# Patient Record
Sex: Female | Born: 1975 | Race: Black or African American | Hispanic: No | Smoking: Never smoker
Health system: Southern US, Community
[De-identification: ages and names within clinical notes are randomized; demographics above are authoritative.]

## PROBLEM LIST (undated history)

## (undated) ENCOUNTER — Emergency Department (HOSPITAL_COMMUNITY): Admission: EM | Disposition: A | Discharge status: Home / Self Care | Payer: Self-pay | Source: Home / Self Care

## (undated) DIAGNOSIS — F209 Schizophrenia, unspecified: Secondary | ICD-10-CM

## (undated) DIAGNOSIS — F32A Depression, unspecified: Secondary | ICD-10-CM

## (undated) HISTORY — PX: INDUCED ABORTION: SHX677

---

## 2007-11-29 ENCOUNTER — Emergency Department
Admission: EM | Admit: 2007-11-29 | Disposition: A | Discharge status: Home / Self Care | Payer: Self-pay | Source: Emergency Department | Admitting: Emergency Medicine

## 2008-02-08 ENCOUNTER — Emergency Department
Admission: EM | Admit: 2008-02-08 | Disposition: A | Discharge status: Home / Self Care | Payer: Self-pay | Source: Emergency Department | Admitting: Emergency Medicine

## 2008-02-08 ENCOUNTER — Ambulatory Visit
Admission: RE | Admit: 2008-02-08 | Disposition: A | Discharge status: Home / Self Care | Payer: Self-pay | Source: Ambulatory Visit | Admitting: Geriatric Psychiatry

## 2008-02-13 LAB — COMPREHENSIVE METABOLIC PANEL
ALT: 21 U/L (ref 7–56)
AST (SGOT): 28 U/L (ref 5–40)
Albumin, Synovial: 5.2 g/dL — ABNORMAL HIGH (ref 3.9–5.0)
Alkaline Phosphatase: 84 U/L (ref 38–126)
BUN / Creatinine Ratio: 13 (ref 8–20)
BUN: 9 mg/dL (ref 6–20)
Bilirubin, Total: 0.3 mg/dL (ref 0.2–1.3)
CO2: 26 mmol/L (ref 21.0–31.0)
Calcium: 10.2 mg/dL (ref 8.4–10.2)
Chloride: 101 mmol/L (ref 101–111)
Creatinine: 0.71 mg/dL (ref 0.5–1.4)
EGFR: >60 mL/min/{1.73_m2}
EGFR: >60 mL/min/{1.73_m2}
Glucose: 93 mg/dL (ref 70–100)
Potassium: 4 mmol/L (ref 3.6–5.0)
Protein, Total: 9.3 g/dL — ABNORMAL HIGH (ref 6.3–8.2)
Sodium: 140 mmol/L (ref 135–145)

## 2008-02-13 LAB — ^CBC WITH DIFF MCKESSON
BASOPHILS %: 0.3 % (ref 0–2)
Baso(Absolute): 0
Eosinophils %: 0 % (ref 0–6)
Eosinophils Absolute: 0
Hematocrit: 38.1 % (ref 27.0–49.5)
Hemoglobin: 12.6 g/dL (ref 11.7–15.5)
Lymphocytes Absolute: 0.9
Lymphocytes Relative: 13.4 % — ABNORMAL LOW (ref 25–55)
MCH: 27.2 pg (ref 27.0–34.0)
MCHC: 33.2 % (ref 32.0–36.0)
MCV: 81.9 fL (ref 80–100)
Monocytes Absolute: 0.5
Monocytes Relative %: 7.4 % (ref 1–8)
Neutrophils Absolute: 5
Neutrophils Relative %: 78.9 % — ABNORMAL HIGH (ref 49–69)
Platelets: 346 10*3/uL (ref 150–400)
RBC: 4.65 /mm3 (ref 3.80–5.40)
RDW: 13.7 % (ref 11.0–14.0)
WBC: 6.4 10*3/uL (ref 4.8–10.8)

## 2008-02-13 LAB — URINALYSIS
Bilirubin, UA: NEGATIVE
Blood, UA: NEGATIVE
Glucose, UA: NEGATIVE
Leukocyte Esterase, UA: NEGATIVE
Nitrate: NEGATIVE
Protein, UR: NEGATIVE
Specific Gravity, UR: 1.001 (ref 1.000–1.035)
Urobilinogen, UA: NORMAL
pH, Urine: 6 (ref 5.0–8.0)

## 2008-02-13 LAB — STS, SERUM: RPR: NONREACTIVE

## 2008-02-13 LAB — TOXICOLOGY SCREEN, SERUM
Barbiturate Screen, UR: NEGATIVE
Benzodiazepine Screen, UR: NEGATIVE
Cocaine Screen: NEGATIVE
Opiate Screen: NEGATIVE
PCP Screen: NEGATIVE
THC Screen: NEGATIVE
Urine Amphetamine Screen: NEGATIVE

## 2008-02-13 LAB — VITAMIN B12 AND FOLATE
Folate: 16.6 ng/mL (ref 2.7–21.0)
Vitamin B-12: 772 pg/mL (ref 239–931)

## 2008-02-13 LAB — ALCOHOL, SERUM: Alcohol Screen Serum: <10 mg/dL (ref 0–50)

## 2008-02-13 LAB — TSH: TSH, 3rd Generation: 1.1 mIU/L (ref 0.465–4.680)

## 2008-02-13 LAB — ACETAMINOPHEN LEVEL: Acetaminophen Level: <10 ug/mL — ABNORMAL LOW (ref 10.0–30.0)

## 2008-02-13 LAB — HCG, SERUM, QUALITATIVE: Hcg Qualitative: NEGATIVE

## 2008-02-13 LAB — SALICYLATE LEVEL: Salicylate Level: <1 mg/dL (ref 0.0–20.0)

## 2008-02-14 ENCOUNTER — Ambulatory Visit
Admission: EM | Admit: 2008-02-14 | Disposition: A | Discharge status: Home / Self Care | Payer: Self-pay | Source: Emergency Department

## 2008-02-23 LAB — COMPREHENSIVE METABOLIC PANEL
ALT: 35 U/L (ref 7–56)
AST (SGOT): 56 U/L — ABNORMAL HIGH (ref 5–40)
Albumin, Synovial: 4.7 g/dL (ref 3.9–5.0)
Alkaline Phosphatase: 68 U/L (ref 38–126)
BUN / Creatinine Ratio: 14 (ref 8–20)
BUN: 9 mg/dL (ref 6–20)
Bilirubin, Total: 0.2 mg/dL (ref 0.2–1.3)
CO2: 29 mmol/L (ref 21.0–31.0)
Calcium: 9.9 mg/dL (ref 8.4–10.2)
Chloride: 103 mmol/L (ref 101–111)
Creatinine: 0.63 mg/dL (ref 0.5–1.4)
EGFR: >60 mL/min/{1.73_m2}
EGFR: >60 mL/min/{1.73_m2}
Glucose: 87 mg/dL (ref 70–100)
Potassium: 3.9 mmol/L (ref 3.6–5.0)
Protein, Total: 8.2 g/dL (ref 6.3–8.2)
Sodium: 141 mmol/L (ref 135–145)

## 2008-02-23 LAB — ^CBC WITH DIFF MCKESSON
BASOPHILS %: 0.8 % (ref 0–2)
Baso(Absolute): 0
Eosinophils %: 0.1 % (ref 0–6)
Eosinophils Absolute: 0
Hematocrit: 38.3 % (ref 27.0–49.5)
Hemoglobin: 12.7 g/dL (ref 11.7–15.5)
Lymphocytes Absolute: 1.2
Lymphocytes Relative: 19 % — ABNORMAL LOW (ref 25–55)
MCH: 27.2 pg (ref 27.0–34.0)
MCHC: 33 % (ref 32.0–36.0)
MCV: 82.4 fL (ref 80–100)
Monocytes Absolute: 0.3
Monocytes Relative %: 4.7 % (ref 1–8)
Neutrophils Absolute: 4.6
Neutrophils Relative %: 75.4 % — ABNORMAL HIGH (ref 49–69)
Platelets: 304 10*3/uL (ref 150–400)
RBC: 4.65 /mm3 (ref 3.80–5.40)
RDW: 14.1 % — ABNORMAL HIGH (ref 11.0–14.0)
WBC: 6.1 10*3/uL (ref 4.8–10.8)

## 2008-02-23 LAB — TSH: TSH, 3rd Generation: 0.588 mIU/L (ref 0.465–4.680)

## 2008-02-23 LAB — VITAMIN B12 AND FOLATE
Folate: 14.6 ng/mL (ref 2.7–21.0)
Vitamin B-12: 968 pg/mL — ABNORMAL HIGH (ref 239–931)

## 2008-02-23 LAB — HCG, SERUM, QUALITATIVE: Hcg Qualitative: NEGATIVE

## 2008-02-23 LAB — STS, SERUM: RPR: NONREACTIVE

## 2011-01-03 ENCOUNTER — Emergency Department
Admission: EM | Admit: 2011-01-03 | Disposition: A | Discharge status: Home / Self Care | Payer: Self-pay | Source: Emergency Department | Admitting: Emergency Medicine

## 2011-08-25 NOTE — H&P (Signed)
(NAME)             Brown, Danielle  (DATE OF BIRTH)    1976/04/03  (ADMIT DATE)       02/14/2008  (MR NUMBER)        532-12-04  (ACCT NUMBER)      [id number]  (ROOM NUMBER)      BH [id number]  (PHYSICIAN)        Page Judie Petit. Primitivo Gauze, M.D.    [facility]  HISTORY AND PHYSICAL      IDENTIFICATION  Danielle Brown is a 35 year old single African-American female admitted on  temporary detention order status due to concern for dangerousness.    HISTORY OF PRESENT ILLNESS  This woman was admitted on temporary detention order 02/08/08. I review Dr.  Denton Ar documentation. This describes a working diagnosis of adjustment  disorder with mixed features. There is a consideration of bipolar disorder.  The patient is a pretty sketchy historian this second admission. I do  review the prescreening material. It says that the patient was at a hotel.  The sheriff's deputies were called for an emergency custody order.  Allegedly, she was in the lobby of the hotel holding onto people, telling  them not to leave her. Reportedly, when the police arrived, she was in the  parking lot holding onto a car. She was not letting the people drive away.  She told the county mental health clinician she could not sleep at home, so  she went to the hotel to get sleep. Through my interview, the patient tells  me she doesn't want to talk, she just wants to take a sleeping pill and go  to sleep. I interview her at 1800 hours. I tell her we don't given sleeping  pills this early in the evening. I try to get her to engage in the  interview. She'll answer a question or two and then again say that she has  to go away and just go to bed. At this time,  the patient does go to her  room. However, I hear from staff that she is behaving in an unusual  fashion. She tells them she needs to be watched. She is unpredictable. I  see that  Dr. Wendie Agreste prescribed Seroquel and Ambien. Reportedly, she did not fill  the Seroquel prescription.    PAST PSYCHIATRIC  HISTORY  Vague. She tells me she has never been treated by psychiatry before. I look  at the records from the first admission on the Portal system. Here, the  nursing note says that Danielle Brown did have counselling with her church in  2003. I ask the patient about suicide thought or suicide behavior. This is  consistently and repeatedly denied in my interview. However, the nursing  staff wrote at the prior admission that she had suicide thoughts in the  early 1990s "Did not act on them."  The patient does tell me her appetite  is okay. She says her energy is okay. Anhedonia is unclear.    FAMILY PSYCHIATRIC HISTORY  Denied.    PERSONAL AND SOCIAL HISTORY  Born and raised in [place]. She has a Master's degree in  Public relations account executive. She has no children and has never been married. She tells me  she is not currently employed. Reportedly, she was employed prior in  Firefighter, IT trainer. I ask her how long she worked  for that group at the prior job and she says  4 years. It is documented that she either  does work or has worked at  [employer]    PAST MEDICAL HISTORY  Apparently benign. Her last menstrual period was 01/31/08. She says her  periods are regular.    ALLERGIES  No known drug allergies.    VITAL SIGNS:  97.2, 73, 20, 131/75. O2 saturation 100%.    LABORATORY DATA  TSH normal at 0.588, B12 and folate are robust. Beta hCG is negative. CBC  and CMP are benign.    DRUG, ALCOHOL, TOBACCO USE  HISTORY  When I ask her about drugs or alcohol, she tells me "I don't remember."  When I ask her about use of tobacco, she states,  "I wish I did."  I do  review her labs, but I don't see a urine drug screen for this admission.    MENTAL STATUS EXAMINATION  This is a petite, age appropriate African-American female in the blue  hospital scrubs. She is tearful in about the first third of the interview.  I ask her where these tears are coming from and she says it's because she  needs to get some sleep.  Eye contact is poor. Affect tends to be sad and  distressed. It is hard to formally assess thought process and content. She  is rather perseverative and repetitive about this issue of going to sleep.  I do not seen direct evidence for altered perceptions. In my time with her,  she does not endorse a psychotic symptom like hallucination or delusion.  Insight and judgment are unclear, although at this time I think they are  poor. It sounds like native intelligence is pretty strong. I am surprised  that her mini mental status exam score for this admission is 21 out of 30.  Her prior mini mental status exam was 30 out of 30. In general, stream of  thought is linear, though with this focus on sleep. She does answer my  questions appropriately when she allows an answer.    DSM IV DIAGNOSIS  AXIS I:       Maintain wide differential diagnosis underneath the heading  of psychotic illness. Consider recurrent mood disorder. Jury remains out on  drug and alcohol contributors.  AXIS II:      Defer diagnosis.  AXIS III:     Benign past medical history.  AXIS IV:      Psychosocial stressors unclear as a primary contributor.  AXIS V:       Level of functioning at entrance to emergency room care does  appear to be deteriorated into the 30s.    TREATMENT RECOMMENDATIONS  It is quite bizarre with the previous admission and this admission. I see  when she was brought to hospital on temporary detention order March 19, it  was because she had left her home to grab another woman's dog.    Apparently, it was a dog of the same breed and similar appearing to her own  pet. This admission, she is behaving bizarre at the hotel. Therefore, I do  have a focus on psychotic illness at the top of the differential diagnosis.  I think we have very limited history on Danielle Brown. I do think a try in  the direction of a formal antipsychotic pharmacotherapy is relevant. I  leave orders for initiation of a Geodon trial. I allow for usage of  the  benzodiazepine lorazepam on a p.r.n. basis for anxiolysis, sedation needs.  I am hopeful for collaboration that can get to diagnostic clarity and  treatment refinement.  Goals of care with the temporary detention order  patient are to provide for safety, initiate treatment, clarify diagnosis.            Page Judie Petit. Primitivo Gauze, M.D.  PMF/med  D:  02/14/2008  6:29 P  T:  02/14/2008 11:05 P  Job #:  [id number]  Doc #:  [id number]  cc:   Page Judie Petit. Primitivo Gauze, M.D.  Authenticated by Caren Macadam, MD On 02/20/2008 07:50:17 AM

## 2011-08-25 NOTE — Discharge Summary (Signed)
(NAME)            Isensee, Avin  (DATE OF BIRTH)   October 31, 1976  (ADMIT DATE)      02/14/2008  [facility] DATE)      02/21/2008  (MR NUMBER)       532-12-04  (ACCT NUMBER)     [id number]  (ROOM NUMBER)     [facility]  (PHYSICIAN)       Page Judie Petit. Primitivo Gauze, M.D.    [facility]  DISCHARGE SUMMARY      IDENTIFICATION  Danielle Brown is a 35 year old single, African-American female admitted on  temporary detention order, converted to court mandated voluntary, with  apparent psychotic illness.    LABORATORY DATA  CBC is benign. Chemistries have only one abnormal value, SGOT a touch high  at 56 where the reference range is 5 to 40. TSH normal at 0.588. B12 robust  968. Folate good 14.6. Beta hCG is negative. Urine drug screen is negative.  RPR is nonreactive.    TREATMENT AND HOSPITAL COURSE  Diagnostic clarity is obscure for Danielle. Musolino. This is her second  hospitalization in March. She does not have a past psychiatric history.  Please see my admit dictation for the details of the unusual behaviors that  I would label as psychotic that have prompted each of these  hospitalizations. The first was an unusual thing about her dog. She  insisted another woman had stolen her dog. It was a dog of the same breed.  Azhia went out and seized that woman's dog. That was hospital admission  #1. Hospital admission #2 this episode of care with admission 02/14/08,  unfolded at the [facility]. The patient says she went to the [facility]  because she could not sleep. She was holding onto people in the lobby and  telling them not to leave her. When the police arrived to take care of her  disturbing behavior, she is holding onto a car in the parking lot and not  letting them drive away.    The patient has poor insight. Her explanatory model for all of this is that  she is not sleeping and that she does not eat well. I meet with her closely  in individual psychotherapy on a daily basis. She continues to focus in on  issues of sleep and appetite  and dietary choice.    There is this idiosyncratic way of interacting for Danielle. Bertz. 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 I do potentially  entertain a cluster A personality disorder style, as part of the diagnostic  differential mix. She does have support from one friend at work. He comes  in and visits her. She is willing to engage in psychotherapy. In fact, she  wants to talk things through. She makes arrangements for follow up  psychotherapy care with Tharon Aquas. She makes arrangements for follow up  psychiatric medication management with Anner Crete M.D..    I do believe that an antipsychotic medication is appropriate. I keep her on  Geodon. With the first episode of care, she was prescribed Seroquel. I  really feet better about a medicine that can still help with sleep, but  would be less of a push toward weight gain or metabolic concerns. I think  Geodon fits this well. The Geodon titration goes up to 120 mg taken on a  full stomach in the  evening. She does require the usage of Ambien as a  sleep aid.  10 mg of Ambien is too much; 5 mg of Ambien is just right.  There is good tolerability with these medications. She continues in  hospital for the 5-days. In fact, I am working towards discharging her a  little bit earlier and she asks to take a bit more time here. She really  seems to want to turn this around. We collaborate about her return to work.  I am going to give her approximately a week off of work. There is no  comorbid substance abuse. She does not endorse anything like hopeless  thought or suicide thought. She does not endorse hallucination. I do not  see direct evidence for delusion.    DSM IV DISCHARGE DIAGNOSIS  AXIS I:       Psychotic disorder not otherwise specified.  AXIS II:      No diagnosis.  AXIS III:     Benign past medical history  AXIS IV:      Psychosocial stressors unclear as a  primary contributor.  AXIS V:       Level of functioning appears adequate, probably in the 55-65  range.    TREATMENT RECOMMENDATIONS  Medications at  discharge are Geodon 120 mg in the evening  Ambien 5 mg at bedtime on a p.r.n. basis for insomnia    Otherwise, activity and diet are as tolerated        Page Judie Petit. Primitivo Gauze, M.D.  PMF/med  D:  02/22/2008 12:00 P  T:  02/26/2008  1:45 P  Job #:  [id number]  Doc #:  [id number]  cc:   Page Judie Petit. Primitivo Gauze, M.D.  Authenticated by Caren Macadam, MD On 03/04/2008 07:45:14 AM

## 2011-08-25 NOTE — Discharge Summary (Signed)
(  NAME)            [name], [name]  (DATE OF BIRTH)   [birth year]  (ADMIT DATE)      02/08/2008  (DISCH DATE)      02/12/2008  (MR NUMBER)       532-12-04  (ACCT NUMBER)     [id number]  (ROOM NUMBER)     BH [id number]  (PHYSICIAN)       Su Monks. Wendie Agreste, M.D.    [facility]  DISCHARGE SUMMARY      REASON FOR HOSPITALIZATION  Danielle Brown is a 35 year old single African-American woman who was  referred for emergency psychiatric admission to the [facility] [facility] at the  [facility] under a temporary detention order  after the patient uncharacteristically tried to physically remove a  Guernsey terrier dog from the grasp of its owner because of the patient's  momentary erroneous belief that the dog was in fact her own Yorkie which  she had just seen at home before leaving for work.  Please see my admission  note for further details.    HOSPITAL COURSE  The patient was polite, soft-spoken and cooperative on admission to the  [facility].  She indicated her remorse and shock at her actions,  stating that she had never done anything like that before and expressing  that "It must have been because I have been sleep deprived and working so  hard at work, especially after I completed by graduate degree in 2008."  The patient denied any suicidal or self destructive or homicidal or  assaultive thoughts, plans or impulses.  She denies hallucinations or  paranoid delusions.  The patient was seen in a detention hearing on the  morning of 02/12/08 and the special justice ordered the patient to be  released  The patient had spoken previously about her plan to seek  outpatient counseling to deal with her recent stressors.    DISCHARGE DIAGNOSIS  AXIS I:    Adjustment disorder with mixed features 309.21.  AXIS II:   Deferred.  AXIS III:  As per the emergency department medical evaluation.  AXIS IV:  Severe stressors prior to admission - difficulty sleeping, social  isolation.  AXIS V:   Estimated to be 35/70 on admission.    DISPOSITION  The patient is discharged as per order of the special justice.  She was on  scheduled medication at the time of her discharge, although p.r.n. Ambien  10 mg by mouth as needed nightly, Ativan 0.5 mg by mouth up to every 4  hours as needed for anxiety and Seroquel 25 mg by mouth up to every 4 hours  as needed for anxiety were ordered during her hospitalization.        Su Monks. Wendie Agreste, M.D.  ASR/ag  D:  02/12/2008  5:18 P  T:  02/15/2008 10:08 A  Job #:  [id number]  Doc #:  [id number]  cc:   Su Monks. Wendie Agreste, M.D.  Authenticated by Kenard Gower, MD On 02/19/2008 01:36:45 PM

## 2011-08-25 NOTE — H&P (Signed)
(NAME)             Danielle Brown, Danielle Brown  (DATE OF BIRTH)    1976/09/28  (ADMIT DATE)       02/08/2008  (MR NUMBER)        532-12-04  (ACCT NUMBER)      [id number]  (ROOM NUMBER)      BH [id number]  (PHYSICIAN)        Su Monks. Wendie Agreste, M.D.    [facility]  HISTORY AND PHYSICAL      CHIEF COMPLAINT AND HISTORY OF PRESENT ILLNESS  The patient is a 35 year old single African-American woman who was referred  for emergency psychiatric admission to the [facility] [facility] at the [facility] under a temporary detention order after the  patient was picked up by Authorities after the patient attempted to  forcibly take a Guernsey terrier dog from another lady in [place]. The  patient is seen, her emergency department workup is reviewed and her case  is coordinated with the staff of the [facility]. The uniform  preadmission screening form filled out by the emergency worker for [facility] is reviewed.  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  She has been somewhat socially isolated and describes  that she has lost touch with some of her friends.  During this stress she  has taken comfort in caring for her own pet Guernsey terrier dog in her  townhouse in [place]. She acknowledges that she left home, locked the door  and that her own Yorkie was inside.  She cannot rationally explain why, a  short time later, while driving she mistakenly thought that another lady  walking a different Yorkie had taken her own dog. She reportedly followed  the lady and then attempted to "take back" what she thought was her own  Yorkie that the other woman had mysteriously taken and altered its  appearance. The patient expressed clear remorse for the actions, is ashamed  of what she did and cannot quite understand it  other than to say "it has to  be sleep depravation." She states that she will never do anything like this  again and she is interested in following up with a counselor following  discharge from the hospital. She specifically denies any suicidal or self  destructive or homicidal or assaultive thoughts, plans, or impulses.  She  denies that anything like this has ever happened before and expresses shock  that her recent exhaustion would have produced such bizarre behavior.  The  patient denies paranoid delusions or ideas of reference.    PAST PSYCHIATRIC HISTORY  Negative. The patient did acknowledge that she kept a journal of her  experience at work in the late Fall of 2008 and that at times she felt a  paranoid interpretation of events there.  She reported that reviewing her  journal later helped her to realize her mistake in perception and she can  find no like between that period of time and her current situation other  than the fact that she has become more sleep deprived.  The patient denies  feelings of euphoria or racing thoughts, or inability to "turn off my  thoughts."  She denies alcohol or drug abuse.    PAST MEDICAL HISTORY  As per the  emergency department medical evaluation.    SOCIAL HISTORY  The patient is originally from [place].  She describes a supportive  family and she states that she is proud of her high academic achievement  and her success professionally.    MENTAL STATUS EXAMINATION  The patient is a polite, petite, African-American woman who wears glasses.  She is neatly dressed and well-groomed.  Her mood is hopeful and her affect  is appropriate. Stream of thought is coherent. Content of thought reveals  that she specifically and repeatedly denies any current or active suicidal  or self destructive or homicidal or assaultive thoughts, plans or impulses.  As above, she expresses shame about her inappropriate and uncharacteristic  loss of behavioral control with regards to the other  ladies Yorkie. She  denies manic symptoms of euphoria, grandiosity or racing thoughts. She  denies paranoid delusions.  Cognitively she is alert and fully oriented  with intact short term and remote memory and fund of knowledge. Insight and  judgement regarding her need for treatment appear to be rapidly improving.      INITIAL DIAGNOSTIC IMPRESSION  AXIS I:    Adjustment disorder with mixed features 309.21. Consider bipolar  affective disorder.  AXIS II:   Deferred.  AXIS III:  As per the emergency department medical evaluation.  AXIS IV:  Severe stressors prior to admission - social isolation, long  hours at work and school, recent lack of sleep.  AXIS V:  Estimated to be 30/70 on admission.    INITIAL TREATMENT PLAN  The patient will be closely monitored for changes in her mood, mental  status and behavior. I reviewed with her that I will make available a  number of medications to help with anxiety and sleep.  Ambien 5 mg, p.r.n.  by mouth nightly as needed for insomnia will be available as will Ativan  0.5 mg as needed for anxiety. I also educated the patient about Seroquel as  a mood stabilizing antipsychotic medication with useful sedating and  tranquilizing effects and I will make Seroquel 25 mg by mouth up to every 4  hours as needed for anxiety available. The patient will be seen in a  detention hearing on the morning of 02/12/08 and the Special Justice will  determine her need for further hospital care.        Su Monks. Wendie Agreste, M.D.  ASR/ls  D:  02/10/2008  4:27 P  T:  02/11/2008  8:09 P  Job #:  [id number]  Doc #:  [id number]  cc:   Su Monks. Wendie Agreste, M.D.  Authenticated by Kenard Gower, MD On 02/12/2008 02:02:44 PM

## 2011-11-23 DIAGNOSIS — F29 Unspecified psychosis not due to a substance or known physiological condition: Secondary | ICD-10-CM

## 2011-11-23 HISTORY — DX: Unspecified psychosis not due to a substance or known physiological condition: F29

## 2012-10-29 ENCOUNTER — Inpatient Hospital Stay: Payer: PRIVATE HEALTH INSURANCE | Admitting: Psychiatry

## 2012-10-29 ENCOUNTER — Emergency Department
Admission: EM | Admit: 2012-10-29 | Discharge: 2012-10-29 | Disposition: A | Discharge status: Other Acute Inpatient Hospital | Payer: PRIVATE HEALTH INSURANCE | Attending: Emergency Medicine | Admitting: Emergency Medicine

## 2012-10-29 ENCOUNTER — Inpatient Hospital Stay
Admission: AD | Admit: 2012-10-29 | Discharge: 2012-10-31 | DRG: 880 | Disposition: A | Discharge status: Home / Self Care | Payer: PRIVATE HEALTH INSURANCE | Source: Ambulatory Visit | Attending: Psychiatry | Admitting: Psychiatry

## 2012-10-29 DIAGNOSIS — F3189 Other bipolar disorder: Secondary | ICD-10-CM | POA: Diagnosis present

## 2012-10-29 DIAGNOSIS — F29 Unspecified psychosis not due to a substance or known physiological condition: Secondary | ICD-10-CM | POA: Insufficient documentation

## 2012-10-29 DIAGNOSIS — F411 Generalized anxiety disorder: Secondary | ICD-10-CM | POA: Diagnosis present

## 2012-10-29 DIAGNOSIS — F4389 Other reactions to severe stress: Principal | ICD-10-CM | POA: Diagnosis present

## 2012-10-29 HISTORY — DX: Depression, unspecified: F32.A

## 2012-10-29 LAB — RAPID DRUG SCREEN, URINE
Barbiturate Screen, UR: NEGATIVE
Benzodiazepine Screen, UR: NEGATIVE
Cannabinoid Screen, UR: NEGATIVE
Cocaine, UR: NEGATIVE
Opiate Screen, UR: NEGATIVE
PCP Screen, UR: NEGATIVE
Urine Amphetamine Screen: NEGATIVE

## 2012-10-29 LAB — COMPREHENSIVE METABOLIC PANEL
ALT: 17 U/L (ref 0–55)
AST (SGOT): 23 U/L (ref 5–34)
Albumin/Globulin Ratio: 1.2 (ref 0.9–2.2)
Albumin: 4.7 g/dL (ref 3.5–5.0)
Alkaline Phosphatase: 62 U/L (ref 40–150)
Anion Gap: 17 — ABNORMAL HIGH (ref 5.0–15.0)
BUN: 13 mg/dL (ref 7.0–19.0)
Bilirubin, Total: 0.6 mg/dL (ref 0.2–1.2)
CO2: 17 mEq/L — ABNORMAL LOW (ref 22–29)
Calcium: 10.2 mg/dL (ref 8.5–10.5)
Chloride: 105 mEq/L (ref 98–107)
Creatinine: 0.9 mg/dL (ref 0.6–1.0)
Globulin: 3.8 g/dL — ABNORMAL HIGH (ref 2.0–3.6)
Glucose: 125 mg/dL — ABNORMAL HIGH (ref 70–100)
Potassium: 4.1 mEq/L (ref 3.5–5.1)
Protein, Total: 8.5 g/dL — ABNORMAL HIGH (ref 6.0–8.3)
Sodium: 139 mEq/L (ref 136–145)

## 2012-10-29 LAB — URINALYSIS
Glucose, UA: NEGATIVE
Ketones UA: 40 — AB
Nitrite, UA: NEGATIVE
Protein, UR: 100 — AB
Specific Gravity UA: 1.028 (ref 1.001–1.035)
Urine pH: 6 (ref 5.0–8.0)
Urobilinogen, UA: 0.2 mg/dL

## 2012-10-29 LAB — URINE MICROSCOPIC

## 2012-10-29 LAB — SALICYLATE LEVEL: Salicylate Level: <5 mg/dL — ABNORMAL LOW (ref 15.0–30.0)

## 2012-10-29 LAB — URINE ICTOTEST

## 2012-10-29 LAB — GFR: EGFR: >60

## 2012-10-29 LAB — CBC AND DIFFERENTIAL
Basophils Absolute Automated: 0.01 10*3/uL (ref 0.00–0.20)
Basophils Automated: 0 % (ref 0–2)
Eosinophils Absolute Automated: 0 10*3/uL (ref 0.00–0.70)
Eosinophils Automated: 0 % (ref 0–5)
Hematocrit: 38.3 % (ref 37.0–47.0)
Hgb: 12.6 g/dL (ref 12.0–16.0)
Immature Granulocytes Absolute: 0.01 10*3/uL
Immature Granulocytes: 0 % (ref 0–1)
Lymphocytes Absolute Automated: 1.34 10*3/uL (ref 0.50–4.40)
Lymphocytes Automated: 26 % (ref 15–41)
MCH: 27 pg — ABNORMAL LOW (ref 28.0–32.0)
MCHC: 32.9 g/dL (ref 32.0–36.0)
MCV: 82 fL (ref 80.0–100.0)
MPV: 10.5 fL (ref 9.4–12.3)
Monocytes Absolute Automated: 0.34 10*3/uL (ref 0.00–1.20)
Monocytes: 7 % (ref 0–11)
Neutrophils Absolute: 3.38 10*3/uL (ref 1.80–8.10)
Neutrophils: 67 % (ref 52–75)
Platelets: 284 10*3/uL (ref 140–400)
RBC: 4.67 10*6/uL (ref 4.20–5.40)
RDW: 15 % (ref 12–15)
WBC: 5.07 10*3/uL (ref 3.50–10.80)

## 2012-10-29 LAB — ETHANOL: Alcohol: NOT DETECTED mg/dL

## 2012-10-29 LAB — ECG 12-LEAD
Atrial Rate: 82 {beats}/min
P Axis: 84 degrees
P-R Interval: 116 ms
Q-T Interval: 368 ms
QRS Duration: 80 ms
QTC Calculation (Bezet): 429 ms
R Axis: 81 degrees
T Axis: 53 degrees
Ventricular Rate: 82 {beats}/min

## 2012-10-29 LAB — ACETAMINOPHEN LEVEL: Acetaminophen Level: <3 ug/mL — ABNORMAL LOW (ref 10–30)

## 2012-10-29 LAB — TSH: TSH: 1.27 u[IU]/mL (ref 0.35–4.94)

## 2012-10-29 MED ORDER — LOPERAMIDE HCL 2 MG PO CAPS
4.00 mg | ORAL_CAPSULE | Freq: Once | ORAL | Status: DC | PRN
Start: 2012-10-29 — End: 2012-10-31

## 2012-10-29 MED ORDER — IBUPROFEN 600 MG PO TABS
600.00 mg | ORAL_TABLET | Freq: Four times a day (QID) | ORAL | Status: DC | PRN
Start: 2012-10-29 — End: 2012-10-31

## 2012-10-29 MED ORDER — HALOPERIDOL 5 MG PO TABS
ORAL_TABLET | ORAL | Status: AC
Start: 2012-10-29 — End: 2012-10-29
  Administered 2012-10-29: 5 mg via ORAL
  Filled 2012-10-29: qty 1

## 2012-10-29 MED ORDER — LORAZEPAM 2 MG/ML IJ SOLN
1.00 mg | Freq: Once | INTRAMUSCULAR | Status: AC
Start: 2012-10-29 — End: 2012-10-29
  Administered 2012-10-29: 1 mg via INTRAVENOUS
  Filled 2012-10-29: qty 1

## 2012-10-29 MED ORDER — ZOLPIDEM TARTRATE 5 MG PO TABS
10.00 mg | ORAL_TABLET | Freq: Every evening | ORAL | Status: DC | PRN
Start: 2012-10-29 — End: 2012-10-31
  Administered 2012-10-29 – 2012-10-30 (×2): 10 mg via ORAL
  Filled 2012-10-29 (×2): qty 2

## 2012-10-29 MED ORDER — PROMETHAZINE HCL 25 MG PO TABS
25.00 mg | ORAL_TABLET | Freq: Four times a day (QID) | ORAL | Status: DC | PRN
Start: 2012-10-29 — End: 2012-10-31

## 2012-10-29 MED ORDER — DOCUSATE SODIUM 100 MG PO CAPS
100.00 mg | ORAL_CAPSULE | Freq: Two times a day (BID) | ORAL | Status: DC | PRN
Start: 2012-10-29 — End: 2012-10-31

## 2012-10-29 MED ORDER — LORAZEPAM 2 MG/ML IJ SOLN
1.00 mg | INTRAMUSCULAR | Status: DC | PRN
Start: 2012-10-29 — End: 2012-10-31

## 2012-10-29 MED ORDER — SODIUM CHLORIDE 0.9 % IV BOLUS
1000.00 mL | Freq: Once | INTRAVENOUS | Status: AC
Start: 2012-10-29 — End: 2012-10-29
  Administered 2012-10-29: 1000 mL via INTRAVENOUS

## 2012-10-29 MED ORDER — LORAZEPAM 1 MG PO TABS
1.00 mg | ORAL_TABLET | ORAL | Status: DC | PRN
Start: 2012-10-29 — End: 2012-10-31
  Administered 2012-10-29 – 2012-10-30 (×2): 1 mg via ORAL
  Filled 2012-10-29 (×2): qty 1

## 2012-10-29 MED ORDER — LORAZEPAM 2 MG/ML IJ SOLN
2.00 mg | Freq: Four times a day (QID) | INTRAMUSCULAR | Status: DC | PRN
Start: 2012-10-29 — End: 2012-10-31

## 2012-10-29 MED ORDER — HALOPERIDOL LACTATE 5 MG/ML IJ SOLN
5.00 mg | Freq: Four times a day (QID) | INTRAMUSCULAR | Status: DC | PRN
Start: 2012-10-29 — End: 2012-10-31

## 2012-10-29 MED ORDER — LOPERAMIDE HCL 2 MG PO CAPS
2.00 mg | ORAL_CAPSULE | ORAL | Status: DC | PRN
Start: 2012-10-29 — End: 2012-10-31

## 2012-10-29 MED ORDER — CIPROFLOXACIN HCL 500 MG PO TABS
500.00 mg | ORAL_TABLET | Freq: Two times a day (BID) | ORAL | Status: DC
Start: 2012-10-29 — End: 2012-10-31
  Administered 2012-10-29 – 2012-10-31 (×4): 500 mg via ORAL
  Filled 2012-10-29 (×4): qty 1

## 2012-10-29 MED ORDER — ZIPRASIDONE MESYLATE 20 MG IM SOLR
20.00 mg | Freq: Two times a day (BID) | INTRAMUSCULAR | Status: DC | PRN
Start: 2012-10-29 — End: 2012-10-31

## 2012-10-29 MED ORDER — ACETAMINOPHEN 325 MG PO TABS
650.00 mg | ORAL_TABLET | Freq: Four times a day (QID) | ORAL | Status: DC | PRN
Start: 2012-10-29 — End: 2012-10-31

## 2012-10-29 MED ORDER — HALOPERIDOL 5 MG PO TABS
5.00 mg | ORAL_TABLET | Freq: Four times a day (QID) | ORAL | Status: DC | PRN
Start: 2012-10-29 — End: 2012-10-31
  Administered 2012-10-30: 5 mg via ORAL
  Filled 2012-10-29: qty 1

## 2012-10-29 MED ORDER — DIPHENHYDRAMINE HCL 25 MG PO CAPS
25.00 mg | ORAL_CAPSULE | ORAL | Status: DC | PRN
Start: 2012-10-29 — End: 2012-10-31

## 2012-10-29 MED ORDER — CIPROFLOXACIN HCL 500 MG PO TABS
500.00 mg | ORAL_TABLET | Freq: Once | ORAL | Status: AC
Start: 2012-10-29 — End: 2012-10-29
  Administered 2012-10-29: 500 mg via ORAL
  Filled 2012-10-29: qty 1

## 2012-10-29 NOTE — H&P (Signed)
ADMISSION HISTORY AND PHYSICAL EXAM    Date Time: 10/29/2012  7:49 PM  Patient Name:Danielle Brown  JWJ:[id number]  PCP: No primary provider on file.  Attending Physician:Suren Payne M.D.,    Assessment/Plan     Patient Active Problem List   Diagnosis   . Psychosis   . Depression   . UTI (lower urinary tract infection)       Plan   Continue to provide safe environment and group therapies   Start Cipro 500 mg BID for UTI   Start Lisinopril 5 mg daily for HTN and LVH noticed on EKG   Check Lipid panel and A1c in AM   Code Status: Full Code    Disposition: Home   Prognosis: Fair    Chief Complaint:   Psychosis       History of Present Illness:   Danielle Brown is a 36 y.o. female who was brought in by Police  Because she called them saying she needed help. Pt was tangential in her thought until she went outside the house. Pt started screaming in a different language, acting erratic. She is complaining of Insomnia and didn't sleep for three days. She went to stay in a hotel room to see if she can sleep there. Police carried her in the ED screaming. She is from [place] and moved to this area several years ago. Pt states she has had lots of work stressors. She denies medical problems has is exhibiting paranoid behavior  She denies taking any prescription medications. She denies using recreational drugs.      Past Medical History:     Past Medical History   Diagnosis Date   . Depression        Past Surgical History:     History reviewed. No pertinent past surgical history.    Family History:     Family History   Problem Relation Age of Onset   . Early death Mother    . Alcohol abuse Father    . Alcohol abuse Paternal Grandfather        Social History:     History   Alcohol Use No     History   Drug Use No     History   Smoking status   . Never Smoker    Smokeless tobacco   . Not on file     History     Social History   . Marital Status: Single     Spouse Name: N/A     Number of Children: N/A   . Years of Education:  N/A     Social History Main Topics   . Smoking status: Never Smoker    . Smokeless tobacco: None   . Alcohol Use: No   . Drug Use: No   . Sexually Active: Not Currently -- Female partner(s)     Birth Control/ Protection: Abstinence     Other Topics Concern   . None     Social History Narrative   . None       Allergies:   No Known Allergies    Medications:     No prescriptions prior to admission       Review of Systems:     Constitutional: Negative for chills, weight loss, malaise/fatigue and diaphoresis.   HENT: Negative for hearing loss, ear pain, nosebleeds, congestion, sore throat, neck pain, tinnitus and ear discharge.    Eyes: Negative for blurred vision, double vision, photophobia, pain, discharge and redness.   Respiratory:  Negative for cough, hemoptysis, sputum production, shortness of breath, wheezing and stridor.    Cardiovascular: Negative for chest pain, palpitations, orthopnea, claudication, leg swelling and PND.   Gastrointestinal: Negative for heartburn, nausea, vomiting, abdominal pain, diarrhea, constipation, blood in stool and melena.   Genitourinary: Negative for dysuria, urgency, frequency, hematuria and flank pain.   Musculoskeletal: Negative for myalgias, back pain, joint pain and falls.   Skin: Negative for itching and rash.   Neurological: Negative for dizziness, tingling, tremors, sensory change, speech change, focal weakness, seizures, loss of consciousness, weakness and headaches.   Endo/Heme/Allergies: Negative for environmental allergies and polydipsia. Does not bruise/bleed easily.   Psychiatric/Behavioral: Positive for depression, No suicidal ideas,No hallucinations, memory loss and substance abuse. The patient is n nervous/anxious and does not have insomnia.           Physical Exam:    height is 1.549 m (5' 0.98") and weight is 59.9 kg (132 lb 0.9 oz). Her temporal artery temperature is 98.9 F (37.2 C). Her blood pressure is 139/74 and her pulse is 80. Her respiration is 18.   Body  mass index is 24.96 kg/(m^2).  Filed Vitals:    10/29/12 1536   BP: 139/74   Pulse: 80   Temp: 98.9 F (37.2 C)   TempSrc: Temporal Artery   Resp: 18   Height: 1.549 m (5' 0.98")   Weight: 59.9 kg (132 lb 0.9 oz)       Constitutional: Patient is oriented to person, place, and time. Patient appears well-developed and well-nourished.   Head: Normocephalic and atraumatic.  Eyes- pupils equal and reactive, extraocular eye movements intact, sclera anicteric  Ears - bilateral TM's and external ear canals normal, right ear normal, left ear normal  Nose - normal and patent, no erythema, discharge or polyps and normal nontender sinuses  Mouth - mucous membranes moist, pharynx normal without lesions  Neck: Normal range of motion. Neck supple. No JVD present. No tracheal deviation present. No thyromegaly present.   Cardiovascular: Normal rate, regular rhythm, normal heart sounds and intact distal pulses.  Exam reveals no gallop and no friction rub. No murmur heard.  Pulmonary/Chest: Effort normal and breath sounds normal. No stridor. No respiratory distress. Patient has no wheezes. No rales were present.  Exhibits no tenderness.   Abdominal: Soft. Bowel sounds are normal. Patient exhibits no distension and no mass was palpable. There is no tenderness. There is no rebound and no guarding.   Musculoskeletal: Normal range of motion. Patient exhibits no edema and no tenderness.   Lymphadenopathy:  Patient has no cervical adenopathy.   Neurological: Patient is alert and oriented to person, place, and time and has normal reflexes. No cranial nerve deficit.  Normal muscle tone. Coordination normal.   Skin: Skin is warm. No rash noted. Patient is not diaphoretic. No erythema. No pallor.   Psychiatric: Has abnormal mood and affect. Behavior is normal. Judgment and thought content normal.          Labs:     Results for orders placed during the hospital encounter of 10/29/12   ACETAMINOPHEN LEVEL       Component Value Range     Acetaminophen Level <3 (*) 10 - 30 ug/mL   COMPREHENSIVE METABOLIC PANEL       Component Value Range    Glucose 125 (*) 70 - 100 mg/dL    BUN 43.3  7.0 - 29.5 mg/dL    Creatinine 0.9  0.6 - 1.0 mg/dL  Sodium 139  136 - 145 mEq/L    Potassium 4.1  3.5 - 5.1 mEq/L    Chloride 105  98 - 107 mEq/L    CO2 17 (*) 22 - 29 mEq/L    Calcium 10.2  8.5 - 10.5 mg/dL    Protein, Total 8.5 (*) 6.0 - 8.3 g/dL    Albumin 4.7  3.5 - 5.0 g/dL    AST (SGOT) 23  5 - 34 U/L    ALT 17  0 - 55 U/L    Alkaline Phosphatase 62  40 - 150 U/L    Bilirubin, Total 0.6  0.2 - 1.2 mg/dL    Globulin 3.8 (*) 2.0 - 3.6 g/dL    Albumin/Globulin Ratio 1.2  0.9 - 2.2    Anion Gap 17.0 (*) 5.0 - 15.0   CBC AND DIFFERENTIAL       Component Value Range    WBC 5.07  3.50 - 10.80 x10 3/uL    RBC 4.67  4.20 - 5.40 x10 6/uL    Hgb 12.6  12.0 - 16.0 g/dL    Hematocrit 16.1  09.6 - 47.0 %    MCV 82.0  80.0 - 100.0 fL    MCH 27.0 (*) 28.0 - 32.0 pg    MCHC 32.9  32.0 - 36.0 g/dL    RDW 15  12 - 15 %    Platelets 284  140 - 400 x10 3/uL    MPV 10.5  9.4 - 12.3 fL    Neutrophils 67  52 - 75 %    Lymphocytes Automated 26  15 - 41 %    Monocytes 7  0 - 11 %    Eosinophils Automated 0  0 - 5 %    Basophils Automated 0  0 - 2 %    Immature Granulocyte 0  0 - 1 %    Neutrophils Absolute 3.38  1.80 - 8.10 x10 3/uL    Abs Lymph Automated 1.34  0.50 - 4.40 x10 3/uL    Abs Mono Automated 0.34  0.00 - 1.20 x10 3/uL    Abs Eos Automated 0.00  0.00 - 0.70 x10 3/uL    Absolute Baso Automated 0.01  0.00 - 0.20 x10 3/uL    Absolute Immature Granulocyte 0.01  0 x10 3/uL   ETHANOL       Component Value Range    Alcohol None Detected  None Detected mg/dL   SALICYLATE LEVEL       Component Value Range    Salicylate Level <5.0 (*) 15.0 - 30.0 mg/dL   TSH       Component Value Range    Thyroid Stimulating Hormone 1.27  0.35 - 4.94 uIU/mL   URINALYSIS       Component Value Range    Urine Type Clean Catch      Color, UA YELLOW  Clear - Yellow    Clarity, UA CLOUDY (*) Clear - Hazy     Specific Gravity UA 1.028  1.001 - 1.035    Urine pH 6.0  5.0-8.0    Leukocytes, UA TRACE (*) Negative    Nitrite, UA NEGATIVE  Negative    Protein, UA 100 (*) Negative    Glucose, UA NEGATIVE  Negative    Ketones UA 40 (*) Negative    Urobilinogen, UA 0.2  0.2  - 2.0 mg/dL    Bilirubin, UA SMALL (*) Negative    Blood, UA TRACE-INTACT (*) Negative   RAPID DRUG  SCREEN, URINE       Component Value Range    Amphetamine Screen, UR Negative  Negative    Barbiturate Screen, UR Negative  Negative    Benzodiazepine Screen, UR Negative  Negative    Cannabinoid Screen, UR Negative  Negative    Cocaine, UR Negative  Negative    Opiate Screen, UR Negative  Negative    PCP Screen, UR Negative  Negative   ECG 12-LEAD       Component Value Range    Ventricular Rate 82      Atrial Rate 82      P-R Interval 116      QRS Duration 80      Q-T Interval 368      QTC Calculation (Bezet) 429      P Axis 84      R Axis 81      T Axis 53     GFR       Component Value Range    EGFR >60.0     URINE MICROSCOPIC       Component Value Range    RBC, UA 0 - 5  0 - 5 /HPF    WBC, UA 11 - 25 (*) 0 - 5 /HPF    Squamous Epithelial Cells, Urine 0 - 5  0 - 25 /HPF    Urine Bacteria Moderate (*) Rare /HPF    Urine Mucus Present  None   URINE ICTOTEST       Component Value Range    Urine Ictotest See comment  Negative     EKG:    NORMAL SINUS RHYTHM  POSSIBLE LEFT ATRIAL ENLARGEMENT  LEFT VENTRICULAR HYPERTROPHY  QTc  429    Rads:     Radiology Results (24 Hour)     ** No Results found for the last 24 hours. **          Total Time spent for Admission:   60    Signed by: Carmelia Bake

## 2012-10-29 NOTE — ED Notes (Signed)
Left message on Dr. Dorcas Carrow cell

## 2012-10-29 NOTE — ED Provider Notes (Addendum)
Physician/Midlevel provider first contact with patient: 10/29/12 0981         History     Chief Complaint   Patient presents with   . Altered Mental Status     Patient is a 36 y.o. female presenting with altered mental status. The history is provided by the patient.   Altered Mental Status  This is a recurrent problem. The current episode started today. The problem occurs intermittently. The problem has been unchanged. Pertinent negatives include no abdominal pain, anorexia, arthralgias, change in bowel habit, chest pain, chills, congestion, coughing, diaphoresis, fever, headaches, joint swelling, nausea, neck pain, sore throat, urinary symptoms, vomiting or weakness. Associated symptoms comments: Police brought pt here b/c pt called them saying she needed help. Pt was tangential in her thought until she went outside the house. Pt started screaming in a different language, acting erratic. Police carried her in the ED screaming. Per police pt is able to understand english. After pt calmed down. Pt states she has not been sleeping well for the last 3 days and stayed at hotel and went back to her house. Pt states she has had lots of work stressors. . Nothing aggravates the symptoms. She has tried nothing for the symptoms.       History reviewed. No pertinent past medical history.    History reviewed. No pertinent past surgical history.    No family history on file.    Social  History   Substance Use Topics   . Smoking status: Not on file   . Smokeless tobacco: Not on file   . Alcohol Use: No       .     No Known Allergies    Current/Home Medications    No medications on file        Review of Systems   Constitutional: Negative for fever, chills and diaphoresis.   HENT: Negative for congestion, sore throat and neck pain.    Eyes: Negative for photophobia.   Respiratory: Negative for cough.    Cardiovascular: Negative for chest pain.   Gastrointestinal: Negative for nausea, vomiting, abdominal pain, anorexia and change  in bowel habit.   Genitourinary: Negative for dysuria and vaginal bleeding.   Musculoskeletal: Negative for joint swelling and arthralgias.   Neurological: Negative for weakness and headaches.   Psychiatric/Behavioral: Positive for behavioral problems, sleep disturbance and altered mental status. The patient is hyperactive.        Physical Exam    BP 120/60  Pulse 86  Temp 99 F (37.2 C) (Tympanic)  Resp 18  Ht 1.549 m  Wt 58.968 kg  BMI 24.58 kg/m2  SpO2 98%    Physical Exam   Nursing note and vitals reviewed.  Constitutional: She is oriented to person, place, and time. She appears well-developed and well-nourished. She appears distressed.        Screaming in the er.    HENT:   Head: Normocephalic and atraumatic.   Mouth/Throat: Oropharynx is clear and moist. No oropharyngeal exudate.   Eyes: EOM are normal. Pupils are equal, round, and reactive to light. No scleral icterus.   Neck: Normal range of motion. Neck supple.   Cardiovascular: Normal rate, regular rhythm, normal heart sounds and intact distal pulses.    No murmur heard.  Pulmonary/Chest: Effort normal and breath sounds normal. No respiratory distress. She has no rales.   Abdominal: Soft. Bowel sounds are normal. There is no tenderness. There is no guarding.   Musculoskeletal: Normal range of motion.  She exhibits no edema.   Lymphadenopathy:     She has no cervical adenopathy.   Neurological: She is alert and oriented to person, place, and time. She has normal strength. No cranial nerve deficit. GCS eye subscore is 4. GCS verbal subscore is 5. GCS motor subscore is 6.   Skin: Skin is warm and dry.   Psychiatric: She has a normal mood and affect. Her behavior is normal.       MDM and ED Course     ED Medication Orders      Start     Status Ordering Provider    10/29/12 1245   ciprofloxacin (CIPRO) tablet 500 mg   Once      Route: Oral  Ordered Dose: 500 mg         Last MAR action:  Given Jerre Diguglielmo    10/29/12 1000   sodium chloride 0.9 %  bolus 1,000 mL   Once      Route: Intravenous  Ordered Dose: 1,000 mL         Last MAR action:  Stopped Kalil Woessner    10/29/12 1000   LORazepam (ATIVAN) injection 1 mg   Once      Route: Intravenous  Ordered Dose: 1 mg         Last MAR action:  Given Leroi Haque               Labs Reviewed   ACETAMINOPHEN LEVEL - Abnormal; Notable for the following:     Acetaminophen Level <3 (*)      All other components within normal limits   COMPREHENSIVE METABOLIC PANEL - Abnormal; Notable for the following:     Glucose 125 (*)      CO2 17 (*)      Protein, Total 8.5 (*)      Globulin 3.8 (*)      Anion Gap 17.0 (*)      All other components within normal limits   CBC AND DIFFERENTIAL - Abnormal; Notable for the following:     MCH 27.0 (*)      All other components within normal limits   SALICYLATE LEVEL - Abnormal; Notable for the following:     Salicylate Level <5.0 (*)      All other components within normal limits   URINALYSIS - Abnormal; Notable for the following:     Clarity, UA CLOUDY (*)      Leukocytes, UA TRACE (*)      Protein, UA 100 (*)      Ketones UA 40 (*)      Bilirubin, UA SMALL (*)      Blood, UA TRACE-INTACT (*)      All other components within normal limits   URINE MICROSCOPIC - Abnormal; Notable for the following:     WBC, UA 11 - 25 (*)      Urine Bacteria Moderate (*)      All other components within normal limits   ETHANOL   TSH   RAPID DRUG SCREEN, URINE   GFR   URINE ICTOTEST   URINE CULTURE       ekg Nsr at 82 with no acute st or t wave changes , short pr inteval. No old ekg to compare.          MDM  Number of Diagnoses or Management Options  Psychosis:   Diagnosis management comments: Dr. Cathlean Marseilles  is the primary attending for this patient and has obtained  and performed the history, PE, and medical decision making for this patient.    Oxygen saturation by pulse oximetry is 95%-100%, Normal.  Interventions: Patient Observed.    Pt has been stable and cooperative during her er stay.  No c/o. Pt is medically clear for psych admission.  D/w dr. Tyson Alias. Who accepted pt..         Amount and/or Complexity of Data Reviewed  Clinical lab tests: ordered and reviewed          Procedures    Clinical Impression & Disposition     Clinical Impression  Final diagnoses:   Psychosis        ED Disposition     Admit Bed Type: General [8]  Admitting Physician: Kathi Ludwig RAZA [[id number]]  Patient Class: Inpatient Psych/Behavioral Health [132]             New Prescriptions    No medications on file               Leonia Reeves, MD  11/02/12 1020    Leonia Reeves, MD  11/23/12 [phone]

## 2012-10-29 NOTE — ED Notes (Signed)
Paged Dr. Rosenblatt

## 2012-10-29 NOTE — ED Notes (Signed)
Pt called 911, not making much since and sounding in distress and dispatch sent the police. They went to bring her in for Mental Eval on the way to the squad car began to yell scream and fight the police.

## 2012-10-29 NOTE — Progress Notes (Signed)
Pt is oriented to self and has a general idea that she is in a hospital setting.  She cannot tell staff why she is here and does not think it has anything to do with any type of behavior displayed on her part.  She had difficulty responding to dr Rustogi's questions.  Could not concentrate on herself or the questions asked.  Had poor insight into situation.  Very flat affect, and occasionally displayed thought blocking.  Pt remained isolative in room the whole evening resting.    Denies SI.  Showed no aggressive behavior.    Tolerated po meds.

## 2012-10-30 LAB — HEMOLYSIS INDEX: Hemolysis Index: 8 Index (ref 0–9)

## 2012-10-30 LAB — LIPID PANEL
Cholesterol / HDL Ratio: 2.8 Index
Cholesterol: 173 mg/dL (ref 0–199)
HDL: 62 mg/dL (ref >40)
LDL Calculated: 103 mg/dL — ABNORMAL HIGH (ref 0–99)
Triglycerides: 38 mg/dL (ref 34–149)
VLDL Calculated: 8 mg/dL — ABNORMAL LOW (ref 10–40)

## 2012-10-30 LAB — HEMOGLOBIN A1C: Hemoglobin A1C: 5.2 % (ref 0.0–6.0)

## 2012-10-30 MED ORDER — QUETIAPINE FUMARATE 25 MG PO TABS
50.00 mg | ORAL_TABLET | Freq: Every evening | ORAL | Status: DC
Start: 2012-10-30 — End: 2012-10-31
  Administered 2012-10-30: 50 mg via ORAL
  Filled 2012-10-30: qty 2

## 2012-10-30 NOTE — Progress Notes (Signed)
Pt woke up around 0500 and seemed more aware of her surroundings.  She brought up that she wasn't fully aware and may not have answered the doctors questions properly during the previous evening.  Pt was clam and cooperative for AM lab draw.  Acknowledged that she was probably brought in for some abnormal behavior.  Admitted that she had been under a lot of stress and lost her job.    Pt seemed more coherant when approached 12/9 in morning.

## 2012-10-30 NOTE — Plan of Care (Signed)
Problem: Alteration in Mood  Goal: Anxiety is at manageable level  Outcome: Progressing  PT WAS ANXIOUS AND SOME WHAT PARANOID ON AWAKENING. HALDOL 5MG  AND ATIVAN 1MG  PO GIVEN WITH EXCELLENT RESULT. SHE DENIES SI/ HI SHE IS CLEAR CAN COMMIT TO SAFETY OUT ON UNIT WITH PEERS SOCIALIZES WELL. STATES SHE FEELS WELL SHE IA EATING AND SLEEPING VERY WELL. COOPERATIVE AND APPROPRIATE THE REST OF AFTERNNOON.    Problem: Moderate/High Fall Risk Score >/=15  Goal: Patient will remain free of falls  Outcome: Progressing  : Demonstrates ability to cope with hospitalization/illness  Outcome: Progressing  Pa.

## 2012-10-30 NOTE — Progress Notes (Signed)
Pt is a 36 yr old [place] female involuntarily admitted by TDO to [facility] [place] [facility] on 10/29/2012.  Primary dx is Psychotic Disorder, NOS.  No hx of prior hospitalizations.   Pt present with bizarre behavior.  Pt reportedly called 911 and when police arrived she was disoriented and confused.  Pt began to yell and fight police when they tried to place her in the police car.  Pt denied knowing why she was at the hospital.  Discharge plan will be determined by court hearing outcome.

## 2012-10-31 MED ORDER — CIPROFLOXACIN HCL 500 MG PO TABS
500.00 mg | ORAL_TABLET | Freq: Two times a day (BID) | ORAL | Status: AC
Start: 2012-10-31 — End: 2012-11-10

## 2012-10-31 NOTE — Psych Admission Note (Signed)
Patient Type: I     ATTENDING PHYSICIAN: Kathi Ludwig, MD     CHIEF COMPLAINT:  "I am very tired and I have not slept for the last 3 days.     HISTORY OF PRESENT ILLNESS:  This patient is a 36 year old African American female, single, domiciled,  unemployed, who was brought to the emergency room under the [facility] for mental  evaluation.  The patient called 911 seeking mental health help.  The  patient reports that she has not been sleeping for at least 3 days.  She  states she went to the hotel last night, to try to get sleep by being in a  different location but was not successful in getting sleep.  She has not  been eating well for several days and not having food due to not having  money.  She also reported she did eat something yesterday.  The patient  reported episode is due to stress and needs to talk to counselor and get  psychiatric help.  She reported that she has significant stress due to not  being able to sleep.  The patient reports that she has been thinking a lot.   "My appetite is good."  Denies any active suicidal or homicidal ideation,  plan, or intent at present.  Denies being depressed, helpless, hopeless.   She says she is very motivated with good energy , not slept for 3  days.  Last job was about 6 to 7 weeks ago.  Denies any signs or symptoms  of psychosis in the past, present.  The patient reports that "I want  someone to talk about my problem ,need behavioral therapist.  I need someone  to help me."  The patient reports she was staying in the [facility] for  1 day at night because she went there to have a good sleep, but  unfortunately, she could not get good sleep.  As per RN she was paranoid  this a.m.  She was screaming also.  As per [facility]'s prescreening notes  she was brought to the ER and the reason for mental evaluation, she was  screaming, kicking and fighting with flailing arms and was placed in the  hospital with 4 points of restrain to help maintain safety.  The patient  was  given Ativan and she became calm and cooperative with the lab works.       As per [facility]'s screening notes, [employer] examiner responded to the  home after she called 911, client was able to go in voluntarily to the  emergency room per deputies.  The patient seemed coherent to seek voluntary  treatment.  However, during the process of leaving the home, she appeared  to become psychotic and in and out of clarity and lucidity.  She thought  something was behind the closed door and when the deputies tried to assist  with getting her coat, and she became fearful.  Her speech became  nonsensical talking in a different unknown language.  When I inquired about  this language, she stated the language name was kurojo and it is a  mixture [place] and [place] language.  She was having psychotic behavior including  screaming and flailing, appearing to be in a trance at times having memory  problems.  She does not remember that she had a dog when the dog was  barking and present in her home.  She was unable to provide clear history at  home.  The  patient was  admitted with the same scenario in March 2009 at [facility] two times, almost the same scenario.  That admission was also a  TDO basis.  She reports she has had significant financial problems due to  lack work, unable to pay her mortgage, does not have cable and has been  trying to apply for jobs, has been unable to find work.   When the  C&L worker left the hospital room briefly and returned the patient  was observed being in a translike state, staring into space, speaking  another language, flailing hands, making arm movements, stopped after a  couple of minutes.  Later on she says that she did voodoo and did not want  to talk much about this.  She reported decreased eating, weight loss due to  financial problems.  She appears to have problems with hygiene.  She is  disheveled, malodorous.  She is poorly groomed.     PAST PSYCHIATRIC HISTORY:  The patient has  a history of 2 previous psychiatric hospitalizations.  The  last hospitalization was in [facility] in 2009 and was seen by Dr.  Wendie Agreste and Dr. Primitivo Gauze.  Per previous note, the patient did have  counseling with the judge in 2003.  The patient denied any prior suicidal  attempt, but she did have suicidal thoughts in the early 90s, but did not  act on them.     FAMILY PSYCHIATRIC HISTORY:  Denies.     PAST MEDICAL HISTORY:  Denies.  At present, she has UTI.     PAST SURGICAL HISTORY:  Denied.     ALLERGIES:  No known drug allergies.     LEGAL HISTORY:  Denies.     SUBSTANCE ABUSE HISTORY:  The patient drinks alcohol occasionally, last drink 2 weeks ago.  Never  admitted to a detoxification unit.  No history of alcohol withdrawal  seizures or blackouts.  Never used any illicit drugs at present or in the  past.  Never smoked cigarettes.     PSYCHOSOCIAL HISTORY:  [place]  She has a Dietitian in Public relations account executive.  She is unemployed for the last 7 weeks.  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  She has no children,  never been married.  She is not currently employed.  She has 2 sisters.   Parents are dead.  She does not had a good relationship with the 2 sisters.   The patient denies having any sexual or physical abuse in childhood.     MENTAL STATUS EXAMINATION:  This patient is a 36 year old [place] American female, not in acute  distress, who appears her stated age.  She mildly disheveled, malodorous,  intermittent eye contact, looks tired and sleepy but cooperative.   Psychomotor retardation, mild, noted.  Speech is normal in volume and tone,  at times slow and low.  Thought process is goal-directed.  At times,  loosening of associations noted.  Mood is described as happy, a little  sleepy.  Affect is restricted with anxious.  The patient denies any active  suicidal or  homicidal ideation, plan, or intent at present.  The patient  also denies auditory, visual, tactile hallucinations currently.  No overt  delusion material noted at present.  The patient is alert, oriented x3,  recent event, recall 2 out of 3 after 5  minutes.  Able to interpret  proverbs.  Unable to do serial 7's. She is able to spell world forward and  backward.  Fair impulse control.  Insight is poor and judgment is limited.     DIAGNOSES:  AXIS I:  Acute distress disorder, rule out bipolar disorder with psychosis,  mood disorder, not otherwise specified, with psychosis.  Anxiety disorder,  not otherwise specified.  Rule out psychosis, not otherwise specified.  AXIS II:  Deferred.  AXIS III:  See past medical history.  AXIS IV:  Jobless, other psychosocial stressors, financial problems, poor  coping skills, noncompliance with medications and treatment.  AXIS V:  Global assessment of functioning 20 to 25.     PLAN:  The patient is admitted to the [facility] on a TDO basis for  providing safe environment and starting treatment and stabilization.   Social worker assessment for safe discharge planning.  We will encourage  the patient to attend group and individual therapy as tolerated.       Latest labs were reviewed.  CBC within normal limits.  CMP: Glucose is 125,  carbon dioxide of 117 and anion gap of 17, protein total 8.8, globulin up  3.8. Hemoglobin A1c is 5.2, normal.  TSH within normal limits.  Blood  alcohol level:  None detected.  Lipid profile LDL up 103 and VLDL is down  at 8.  Urinalysis is cloudy, leukocyte trace, protein 100, ketones 40.   Urine toxicology within normal limit, WBCs 11 to 25, bacteria moderate,  bilirubin small.       We will start the patient on Seroquel 50 mg p.o. nightly and Ambien 10 mg  p.o. nightly p.r.n. for sleep, Ativan 1 mg p.o. or IM every 4 to 6 hours  p.r.n. for anxiety and agitations.  The risks and benefits were discussed  in detail with the patient.  The patient  verbalized understanding and  agreed to take the medication as advised by this Clinical research associate.  We will try to  collect collateral information from her family members and friends and  outpatient provider.  The patient will be seen by Dr. Thedore Mins for acute or  chronic medical problems.           D:  10/31/2012 02:23 AM by Dr. Renne Musca, MD ([id number])  T:  10/31/2012 05:00 AM by Crissie Sickles      Everlean Cherry: [id number]) (Doc ID: [id number])

## 2012-10-31 NOTE — Progress Notes (Signed)
D/C to home at this time via taxi

## 2012-10-31 NOTE — Progress Notes (Signed)
SUBJECTIVE:  The patient was seen in a hearing with the special magistrate and given  permission to leave the unit.  Prior to her leaving, I checked in with her  and asked if she wanted a prescription.  She did want a prescription for  the Cipro to treat her urinary tract infection.  She said that her plan was  to go home and to follow up with [facility].  She showed  me a business card she had for [facility] and spoke of  plans to make an appointment.     PSYCHIATRIC SPECIALTY EXAMINATION:  VITAL SIGNS:  See RN assessment reviewed by me.  MUSCULOSKELETAL:  The patient demonstrated no rigidity, flaccidity,  akathisia, choreoathetoid movements, tics.     MENTAL STATUS EXAMINATION:  She presented as a well-developed, well-nourished 36 year old African  American woman who appeared to be her stated age.  She was well groomed and  demonstrated good hygiene.  She was alert, cooperative, made intermittent  eye contact, and maintained interest in the interview.  She demonstrated no  psychomotor agitation, retardation, or movement problems.  Speech was goal  directed, nonpressured, of normal rate and rhythm.  Her affect was pleasant  with slightly restricted range and mood congruent.  She denied and  demonstrated no evidence of suicidal, homicidal, or psychotic ideation,  although I did not spend a lot of time trying to elicit these symptoms.   She was alert and oriented x3.  Insight, motivation, and judgment not  tested.  Memory, fund of knowledge, and use of language appear grossly  within normal limits.     DIAGNOSIS:  According to Dr. Rowe Clack admission diagnosis, acute distress disorder, rule  out bipolar disorder with psychosis.     ASSESSMENT:  Currently deemed safe for discharge per special magistrate.     PLAN:  The patient will be discharged today per order of special magistrate.  I  will provide a prescription for Cipro so she may continue treatment of her  urinary tract  infection.  The patient spoke of plans to follow up with  [facility].       Estimated floor time 15 minutes of which at least half was spent in the  coordination of care and counseling, including talking with the patient  regarding her plans for discharge and assessing mental status.

## 2012-10-31 NOTE — Plan of Care (Signed)
Problem: Psychosocial and Spiritual Needs  Goal: Demonstrates ability to cope with hospitalization/illness  Intervention: Encourage verbalization of feelings/concerns/expectations  Pt observed to isolate to room throughout the evening/night.  Pt noted to maintain good behavioral control.  Pt denies SI.  Pt reports feeling tired and noted to be reluctant to taking medication.  Pt observed to have medications hidden on side of mouth when this writer performed mouth checks.  Pt swallowed medications with staff encouragement.  Pt declined to join peers in the lobby during the evening.  Pt observed to sleep for an increased part of the evening/night.  Hearing is scheduled for Tuesday morning.

## 2012-11-02 NOTE — Discharge Summary (Signed)
Discharge Date: 10/31/2012     ATTENDING PHYSICIAN:  Kathi Ludwig, MD     HISTORY OF PRESENT ILLNESS:  Please refer to the psychiatry admission note of October 31, 2012,  dictated by this Clinical research associate, for full psychiatric present history, past  psychiatric history, medical history, past surgical history, psychosocial  history, allergic history, legal history, and substance abuse history.     Briefly, this patient is a 36 year old [place] American female, single,  domiciled, unemployed, was brought to the emergency room under [facility] for  mental evaluation as the patient seek mental health.  The patient was  ad[facility] to the [facility] on a TDO basis.      HOSPITAL COURSE:  The patient was admitted to the [facility] on October 29, 2012, on TDO  basis.  The patient was started on Seroquel 50 mg p.o. nightly, Ambien 10 mg p.o. p.r.n. for sleep, Ativan 1 mg p.o. or IM every 4 hours for anxiety,  agitation and the patient was seen by Dr. Thedore Mins during her admissions and  Dr. Thedore Mins started Cipro for urinary tract infections.  The patient took the  medications without any side effects.  The patient was monitored every 15  minutes upon admission.  The patient was monitored very closely for  suicidal ideation, although very short period of hospitalizations.  The  patient has not attended the groups per RN note.  The patient remains  isolated in her room.  She did not exhibit any abnormal behavior during her  short period of hospitalization and the patient had hearing on the morning  of 31 October 2012 and the patient was discharged per order of the special  magistrate on October 31, 2012.       The patient was discharged by Dr. Franchot Erichsen as I was off on that day  because of the poor weather.  As per Dr. Dorcas Carrow note, she stated her  plan was to go home to follow up with [facility] health and she showed Dr.  Franchot Erichsen a business card she had filled out in [facility] and  she spoke of plans to make an  appointment.       Mental status examination was done by Dr. Franchot Erichsen on discharge date.   The patient was well nourished, 36 year old [place] American female who  appeared her stated age.  She was well groomed, demonstrated good hygiene,  alert, cooperative, made intermittent eye contact.  During discharge  interview she demonstrated no psychomotor agitation, retardation, or  movement problem, speech was goal directed and pressured, normal rate,  rhythm.  Affect was pleasant.  Slightly restricted range and  , mood congruent and  demonstrated no evidence of suicidal, homicidal, or psychotic ideation,  although she did not spend a lot of time trying to elicit these symptoms, she was alert,  oriented x3.  Insight, mood and judgment were not tested by Dr. Franchot Erichsen.   Memory for knowledge and use of language appear grossly within normal  limits.      DIAGNOSES:  AXIS I:  Rule out mood disorder, not otherwise specified with psychosis.   Rule out psychosis not otherwise specified.  AXIS II:  Deferred.  AXIS III:  Past medical history.   AXIS IV:  Jobless, other psychosocial stressors, financial problems, poor  coping skills, limited social support, noncompliance with medications and  treatment.  AXIS V:  Unable to access.       PLAN:  The patient is discharged per special justice on October 31, 2012.  She was given a prescription of Cipro by Dr. Franchot Erichsen as she wanted to continue the treatment for a urinary tract infection.  The patient spoke of  plans to follow up with [facility] as reported to Dr.Rosenblatt.           D:  11/01/2012 20:28 PM by Dr. Renne Musca, MD ([id number])  T:  11/02/2012 14:25 PM by [id number]      Everlean Cherry: [id number]) (Doc ID: [id number])

## 2015-08-18 DIAGNOSIS — R0789 Other chest pain: Secondary | ICD-10-CM | POA: Insufficient documentation

## 2016-06-14 ENCOUNTER — Emergency Department: Payer: Self-pay

## 2016-06-14 ENCOUNTER — Emergency Department
Admission: EM | Admit: 2016-06-14 | Discharge: 2016-06-14 | Disposition: A | Discharge status: Home / Self Care | Payer: Self-pay | Attending: Emergency Medicine | Admitting: Emergency Medicine

## 2016-06-14 DIAGNOSIS — R0789 Other chest pain: Secondary | ICD-10-CM | POA: Insufficient documentation

## 2016-06-14 LAB — CBC AND DIFFERENTIAL
Absolute NRBC: 0 10*3/uL
Basophils Absolute Automated: 0.02 10*3/uL (ref 0.00–0.20)
Basophils Automated: 0.4 %
Eosinophils Absolute Automated: 0.03 10*3/uL (ref 0.00–0.70)
Eosinophils Automated: 0.6 %
Hematocrit: 36.1 % — ABNORMAL LOW (ref 37.0–47.0)
Hgb: 11.6 g/dL — ABNORMAL LOW (ref 12.0–16.0)
Immature Granulocytes Absolute: 0.01 10*3/uL
Immature Granulocytes: 0.2 %
Lymphocytes Absolute Automated: 1.71 10*3/uL (ref 0.50–4.40)
Lymphocytes Automated: 34.8 %
MCH: 26.9 pg — ABNORMAL LOW (ref 28.0–32.0)
MCHC: 32.1 g/dL (ref 32.0–36.0)
MCV: 83.8 fL (ref 80.0–100.0)
MPV: 10.7 fL (ref 9.4–12.3)
Monocytes Absolute Automated: 0.34 10*3/uL (ref 0.00–1.20)
Monocytes: 6.9 %
Neutrophils Absolute: 2.8 10*3/uL (ref 1.80–8.10)
Neutrophils: 57.1 %
Nucleated RBC: 0 /100 WBC (ref 0.0–1.0)
Platelets: 309 10*3/uL (ref 140–400)
RBC: 4.31 10*6/uL (ref 4.20–5.40)
RDW: 14 % (ref 12–15)
WBC: 4.91 10*3/uL (ref 3.50–10.80)

## 2016-06-14 LAB — TROPONIN I: Troponin I: <0.01 ng/mL (ref 0.00–0.09)

## 2016-06-14 LAB — COMPREHENSIVE METABOLIC PANEL
ALT: 8 U/L (ref 0–55)
AST (SGOT): 13 U/L (ref 5–34)
Albumin/Globulin Ratio: 1.1 (ref 0.9–2.2)
Albumin: 3.9 g/dL (ref 3.5–5.0)
Alkaline Phosphatase: 66 U/L (ref 37–106)
Anion Gap: 11 (ref 5.0–15.0)
BUN: 17.6 mg/dL (ref 7.0–19.0)
Bilirubin, Total: 0.1 mg/dL — ABNORMAL LOW (ref 0.2–1.2)
CO2: 23 mEq/L (ref 22–29)
Calcium: 9.4 mg/dL (ref 8.5–10.5)
Chloride: 109 mEq/L (ref 100–111)
Creatinine: 0.8 mg/dL (ref 0.6–1.0)
Globulin: 3.5 g/dL (ref 2.0–3.6)
Glucose: 103 mg/dL — ABNORMAL HIGH (ref 70–100)
Potassium: 3.7 mEq/L (ref 3.5–5.1)
Protein, Total: 7.4 g/dL (ref 6.0–8.3)
Sodium: 143 mEq/L (ref 136–145)

## 2016-06-14 LAB — GFR: EGFR: >60

## 2016-06-14 LAB — IHS D-DIMER: D-Dimer: 0.5 ug/mL FEU (ref 0.00–0.51)

## 2016-06-14 LAB — HCG, SERUM, QUALITATIVE: Hcg Qualitative: NEGATIVE

## 2016-06-14 MED ORDER — NAPROXEN 500 MG PO TABS
500.0000 mg | ORAL_TABLET | Freq: Two times a day (BID) | ORAL | Status: DC
Start: 2016-06-14 — End: 2017-07-20

## 2016-06-14 NOTE — Discharge Instructions (Signed)
Chest Pain of Unclear Etiology    You have been seen for chest pain. The cause of your pain is not yet known.    Your doctor has learned about your medical history, examined you, and checked any tests that were done. Still, it is unclear why you are having pain. The doctor thinks there is only a very small chance that your pain is caused by a life-threatening condition. Later, your primary care doctor might do more tests or check you again.    Sometimes chest pain is caused by a dangerous condition, like a heart attack, aorta injury, blood clot in the lung, or collapsed lung. It is unlikely that your pain is caused by a life-threatening condition if: Your chest pain lasts only a few seconds at a time; you are not short of breath, nauseated (sick to your stomach), sweaty, or lightheaded; your pain gets worse when you twist or bend; your pain improves with exercise or hard work.    Chest pain is serious. It is VERY IMPORTANT that you follow up with your regular doctor and seek medical attention immediately here or at the nearest Emergency Department if your symptoms become worse or they change.    YOU SHOULD SEEK MEDICAL ATTENTION IMMEDIATELY, EITHER HERE OR AT THE NEAREST EMERGENCY DEPARTMENT, IF ANY OF THE FOLLOWING OCCURS:   Your pain gets worse.   Your pain makes you short of breath, nauseated, or sweaty.   Your pain gets worse when you walk, go up stairs, or exert yourself.   You feel weak, lightheaded, or faint.   It hurts to breathe.   Your leg swells.   Your symptoms get worse or you have new symptoms or concerns.              Musculoskeletal Chest Pain    You have been diagnosed with musculoskeletal chest pain.    Your pain is due to an injury or inflammation (swelling) of the muscles, ligaments, cartilage (soft bone), or bone in your chest. The pain is usually sharp and knife-like and becomes worse with twisting, bending, or moving. It commonly occurs in a small area, and can be irritated by  pressing on it. There is usually no shortness of breath, lightheadedness, weakness, or sweaty feeling. Some children will have pain when taking a deep breath or when coughing. Exercise usually does not affect these symptoms.    Musculoskeletal chest pain is treated with anti-inflammatory medications like ibuprofen (Advil or Motrin) or naproxen (Aleve). Other pain medications are usually not needed. Depending on the reason for your symptoms, either warm or cool compresses (damp washcloths laid on the skin) may be helpful.    Most musculoskeletal chest pain improves over several days.    You do not need to follow up with a doctor unless your symptoms get worse or fail to improve in the next few days.    YOU SHOULD SEEK MEDICAL ATTENTION IMMEDIATELY, EITHER HERE OR AT THE NEAREST EMERGENCY DEPARTMENT, IF ANY OF THE FOLLOWING OCCURS:   Your pain gets worse.   Your pain makes you feel short of breath, nauseated, or sweaty.   You notice that your pain gets worse as you walk, go up stairs, or exert yourself.   You have any weakness or lightheadedness with your pain.   Your pain makes breathing difficult.   You develop a swollen leg.   Your symptoms get worse or you have other concerns.     Refused the chest x-ray.  We are  unable to fully explain her chest pain.  There is a risk of missing a pneumonia, fluid around the lungs.  Collapsed lung or other chest problems.

## 2016-06-14 NOTE — ED Provider Notes (Signed)
Physician/Midlevel provider first contact with patient: 06/14/16 1437         History     Chief Complaint   Patient presents with   . Chest Pain     HPI Comments: 40 year old female presents with lower sternal pain starting 1 hour ago.  Worse with deep breaths and movement.  She denies any recent headache, dizziness, blurred vision, double vision, shortness breath, nausea or vomiting.  Denies any recent trauma or travel.  Patient states she has had this once before but has never discovered what causes it.  Patient is not a diabetic, does not have hypertension.  No significant family history does not smoke or drink.  No change or problems, bowel or bladder.  No blood in the stool.    Worse with movement, improves at rest.    The history is provided by the patient.            Past Medical History   Diagnosis Date   . Depression        History reviewed. No pertinent past surgical history.    Family History   Problem Relation Age of Onset   . Early death Mother    . Alcohol abuse Father    . Alcohol abuse Paternal Grandfather        Social  Social History   Substance Use Topics   . Smoking status: Never Smoker    . Smokeless tobacco: None   . Alcohol Use: No       .     No Known Allergies    Home Medications     Last Medication Reconciliation Action:  In Progress [name], RN 06/14/2016  2:08 PM          No Medications           Review of Systems   Constitutional: Negative for fever and diaphoresis.   HENT: Negative for congestion, ear pain and sore throat.    Eyes: Negative for pain and visual disturbance.   Respiratory: Negative for cough, chest tightness, shortness of breath and wheezing.    Cardiovascular: Positive for chest pain. Negative for palpitations and leg swelling.   Gastrointestinal: Negative for nausea, vomiting, abdominal pain, diarrhea and constipation.   Genitourinary: Negative for dysuria and urgency.   Musculoskeletal: Negative for back pain, gait problem and neck pain.   Skin: Negative for  color change and rash.   Neurological: Negative for dizziness, weakness and headaches.   Psychiatric/Behavioral: Negative for confusion. The patient is not nervous/anxious.        Physical Exam    BP: 115/54 mmHg, Heart Rate: 74, Temp: 97.6 F (36.4 C), Resp Rate: 12, SpO2: 99 %, Weight: 60.1 kg    Physical Exam   Constitutional: She is oriented to person, place, and time. She appears well-developed and well-nourished.   HENT:   Head: Normocephalic and atraumatic.   Right Ear: External ear normal.   Left Ear: External ear normal.   Mouth/Throat: Oropharynx is clear and moist. No oropharyngeal exudate.   Eyes: Conjunctivae are normal. Pupils are equal, round, and reactive to light. Right eye exhibits no discharge. Left eye exhibits no discharge. No scleral icterus.   Neck: Normal range of motion. Neck supple. No JVD present. No tracheal deviation present.   Cardiovascular: Normal rate, regular rhythm and normal heart sounds.    No murmur heard.  Pulmonary/Chest: Effort normal and breath sounds normal. She has no wheezes. She exhibits tenderness. She exhibits no crepitus.  Abdominal: Soft. Bowel sounds are normal. She exhibits no distension. There is no tenderness. There is no guarding.   Musculoskeletal: She exhibits no edema or tenderness.   Neurological: She is alert and oriented to person, place, and time. She has normal strength. Coordination normal. GCS eye subscore is 4. GCS verbal subscore is 5. GCS motor subscore is 6.   Skin: Skin is warm and dry. She is not diaphoretic. No pallor.   Psychiatric: She has a normal mood and affect. Her behavior is normal. Judgment normal.   Nursing note and vitals reviewed.        MDM and ED Course     ED Medication Orders     None             MDM  Number of Diagnoses or Management Options  Chest wall pain:   Diagnosis management comments: Diagnosis management comments: Oxygen saturation by pulse oximetry is 95%-100%, Normal.  Interventions: None Needed.      Time  1404  EKG Interpretation  EKG interpreted by ED physician  Rate: Normal for age.  Rhythm: Normal sinus rhythm  Axis: Normal for age  PR, QRS and QT intervals:  normal for age and rate  ST Segments: No deviations suggestive of ischemia  Impression: Normal ECG with no evidence of ischemia.    Attending : Dr. Melvern Sample    [facility] dx: chest wall pain.  Doubt CAD, angina, pneumonia, electrolyte disorder,  PE.    Patient is refusing to have a chest x-ray.  She states "I just do not want it."  I discussed the risks of leaving before chest x-ray, including missing a pneumonia, pneumothorax, pleural effusion or other injury or thoracic pathology.  I think these are fairly low risk.  However, they are important.  Patient verbalized understanding and states she still does not want to have the test.  She has capacity to make this decision.    Patient is alert and oriented 3.  On reexamination, vital signs were stable, and patient is in no acute distress.  At this point she is stable for discharge.  Danielle Brown had an opportunity to ask questions and she verbalized understanding of instructions.      I have reviewed the nursing history.    DR. Melvern Sample  is the primary attending for this patient and has obtained and performed the history, PE, and medical decision making for this patient.      Results     Procedure Component Value Units Date/Time    D-Dimer ([id number]) Collected:  06/14/16 1419     D-Dimer 0.50 ug/mL FEU Updated:  06/14/16 1514    hCG, Qualitative (Pos/Neg) ([id number]) Collected:  06/14/16 1419    Specimen Information:  Blood Updated:  06/14/16 1506     Hcg Qualitative Negative     Troponin I ([id number]) Collected:  06/14/16 1419    Specimen Information:  Blood Updated:  06/14/16 1453     Troponin I <0.01 ng/mL     Comprehensive Metabolic Panel (CMP) ([id number])  (Abnormal) Collected:    06/14/16 1419    Specimen Information:  Blood Updated:  06/14/16 1443     Glucose 103 (H) mg/dL      BUN 62.9 mg/dL       Creatinine 0.8 mg/dL      Sodium 528 mEq/L      Potassium 3.7 mEq/L      Chloride 109 mEq/L      CO2 23 mEq/L  Calcium 9.4 mg/dL      Protein, Total 7.4 g/dL      Albumin 3.9 g/dL      AST (SGOT) 13 U/L      ALT 8 U/L      Alkaline Phosphatase 66 U/L      Bilirubin, Total 0.1 (L) mg/dL      Globulin 3.5 g/dL      Albumin/Globulin Ratio 1.1      Anion Gap 11.0     GFR ([id number]) Collected:  06/14/16 1419     EGFR >60.0 Updated:  06/14/16 1443    CBC with differential ([id number])  (Abnormal) Collected:  06/14/16 1419    Specimen Information:  Blood from Blood Updated:  06/14/16 1427     WBC 4.91 x10 3/uL      Hgb 11.6 (L) g/dL      Hematocrit 78.2 (L) %      Platelets 309 x10 3/uL      RBC 4.31 x10 6/uL      MCV 83.8 fL      MCH 26.9 (L) pg      MCHC 32.1 g/dL      RDW 14 %      MPV 10.7 fL      Neutrophils 57.1 %      Lymphocytes Automated 34.8 %      Monocytes 6.9 %      Eosinophils Automated 0.6 %      Basophils Automated 0.4 %      Immature Granulocyte 0.2 %      Nucleated RBC 0.0 /100 WBC      Neutrophils Absolute 2.80 x10 3/uL      Abs Lymph Automated 1.71 x10 3/uL      Abs Mono Automated 0.34 x10 3/uL      Abs Eos Automated 0.03 x10 3/uL      Absolute Baso Automated 0.02 x10 3/uL      Absolute Immature Granulocyte 0.01 x10 3/uL      Absolute NRBC 0.00 x10 3/uL         Radiology Results (24 Hour)     ** No results found for the last 24 hours. **        *This note was generated by the Epic EMR system/ Dragon speech recognition and may contain inherent errors or omissions not intended by the user. Grammatical errors, random word insertions, deletions, pronoun errors and incomplete sentences are occasional consequences of this technology due to software limitations. Not all errors are caught or corrected. If there are questions or concerns about the content of this note or information contained within the body of this dictation they should be addressed directly with the author for clarification                Amount and/or Complexity of Data Reviewed  Clinical lab tests: ordered and reviewed  Tests in the medicine section of CPT: ordered and reviewed          Wells/PERC Score         Value    Wells Score     Previous DVT or PE  0    HR greater than 100  0    Surgery within 4 weeks, or Immobilization in last 3 days  0    Clinical Signs/Symptoms of DVT  0    Alternative diagnosis less likely than PE  0    Hemoptysis  0    Malignancy with treatment within 6 months or palliative care  0    Wells Score for PE  0    Wells Rule Risk  Low Risk: <2 points (Continue to [facility] Calculator)    PERC Score     Age (read only)  less than or equal to 50 years    HR >= 100  0    O2 Sat on Room Air < 95%  0    Prior History of Venous Thromboembolism  0    Trauma or Surgery within 4 weeks  0    Hemoptysis  0    Exogenous Estrogen  0    Unilateral Leg Swelling  0    PERC Rule Score (Calculated)  0    PERC Rule Risk  Low Risk: Less than 2% chance of PE - May not need further workup.          Procedures    Clinical Impression & Disposition     Clinical Impression  Final diagnoses:   Chest wall pain        ED Disposition     Discharge Charlaine Bontrager discharge to home/self care.    Condition at disposition: Stable             New Prescriptions    NAPROXEN (NAPROSYN) 500 MG TABLET    Take 1 tablet (500 mg total) by mouth 2 (two) times daily with meals.                   Melvern Sample, DO  06/14/16 1549

## 2016-06-14 NOTE — ED Notes (Signed)
PT refused pregnancy test for chest x-ray

## 2016-06-14 NOTE — ED Notes (Signed)
Pt co intermittent sharp L sided chest pains started just PTA. Denies sob or dizziness. Denies radiating pain. Perrla. Denies n/v/d. Denies weapons or firearms on person.

## 2016-06-15 LAB — ECG 12-LEAD
Atrial Rate: 64 {beats}/min
P Axis: 70 degrees
P-R Interval: 134 ms
Q-T Interval: 384 ms
QRS Duration: 84 ms
QTC Calculation (Bezet): 396 ms
R Axis: 62 degrees
T Axis: 28 degrees
Ventricular Rate: 64 {beats}/min

## 2017-07-20 ENCOUNTER — Emergency Department
Admission: EM | Admit: 2017-07-20 | Discharge: 2017-07-21 | Disposition: A | Discharge status: Home / Self Care | Payer: Self-pay | Attending: Emergency Medicine | Admitting: Emergency Medicine

## 2017-07-20 DIAGNOSIS — Z5900 Homelessness unspecified: Secondary | ICD-10-CM

## 2017-07-20 DIAGNOSIS — N390 Urinary tract infection, site not specified: Secondary | ICD-10-CM | POA: Insufficient documentation

## 2017-07-20 DIAGNOSIS — E860 Dehydration: Secondary | ICD-10-CM | POA: Insufficient documentation

## 2017-07-20 DIAGNOSIS — R319 Hematuria, unspecified: Secondary | ICD-10-CM | POA: Insufficient documentation

## 2017-07-20 LAB — CBC AND DIFFERENTIAL
Absolute NRBC: 0 10*3/uL
Basophils Absolute Automated: 0.04 10*3/uL (ref 0.00–0.20)
Basophils Automated: 0.9 %
Eosinophils Absolute Automated: 0.08 10*3/uL (ref 0.00–0.70)
Eosinophils Automated: 1.7 %
Hematocrit: 35.8 % — ABNORMAL LOW (ref 37.0–47.0)
Hgb: 11.5 g/dL — ABNORMAL LOW (ref 12.0–16.0)
Immature Granulocytes Absolute: 0.01 10*3/uL
Immature Granulocytes: 0.2 %
Lymphocytes Absolute Automated: 1.99 10*3/uL (ref 0.50–4.40)
Lymphocytes Automated: 42.7 %
MCH: 26.5 pg — ABNORMAL LOW (ref 28.0–32.0)
MCHC: 32.1 g/dL (ref 32.0–36.0)
MCV: 82.5 fL (ref 80.0–100.0)
MPV: 10.6 fL (ref 9.4–12.3)
Monocytes Absolute Automated: 0.37 10*3/uL (ref 0.00–1.20)
Monocytes: 7.9 %
Neutrophils Absolute: 2.17 10*3/uL (ref 1.80–8.10)
Neutrophils: 46.6 %
Nucleated RBC: 0 /100 WBC (ref 0.0–1.0)
Platelets: 286 10*3/uL (ref 140–400)
RBC: 4.34 10*6/uL (ref 4.20–5.40)
RDW: 15 % (ref 12–15)
WBC: 4.66 10*3/uL (ref 3.50–10.80)

## 2017-07-20 LAB — COMPREHENSIVE METABOLIC PANEL
ALT: 12 U/L (ref 0–55)
AST (SGOT): 19 U/L (ref 5–34)
Albumin/Globulin Ratio: 1.1 (ref 0.9–2.2)
Albumin: 4.1 g/dL (ref 3.5–5.0)
Alkaline Phosphatase: 62 U/L (ref 37–106)
Anion Gap: 11 (ref 5.0–15.0)
BUN: 9.7 mg/dL (ref 7.0–19.0)
Bilirubin, Total: 0.4 mg/dL (ref 0.2–1.2)
CO2: 26 mEq/L (ref 22–29)
Calcium: 10.1 mg/dL (ref 8.5–10.5)
Chloride: 103 mEq/L (ref 100–111)
Creatinine: 1 mg/dL (ref 0.6–1.0)
Globulin: 3.8 g/dL — ABNORMAL HIGH (ref 2.0–3.6)
Glucose: 75 mg/dL (ref 70–100)
Potassium: 3.5 mEq/L (ref 3.5–5.1)
Protein, Total: 7.9 g/dL (ref 6.0–8.3)
Sodium: 140 mEq/L (ref 136–145)

## 2017-07-20 LAB — HCG, SERUM, QUALITATIVE: Hcg Qualitative: NEGATIVE

## 2017-07-20 LAB — URINALYSIS, REFLEX TO MICROSCOPIC EXAM IF INDICATED
Glucose, UA: NEGATIVE
Ketones UA: 5 — AB
Nitrite, UA: NEGATIVE
Protein, UR: 30 — AB
Specific Gravity UA: 1.023 (ref 1.001–1.035)
Urine pH: 5 (ref 5.0–8.0)
Urobilinogen, UA: 2 mg/dL

## 2017-07-20 LAB — LIPASE: Lipase: 31 U/L (ref 8–78)

## 2017-07-20 LAB — GFR: EGFR: >60

## 2017-07-20 MED ORDER — SODIUM CHLORIDE 0.9 % IV BOLUS
1000.0000 mL | Freq: Once | INTRAVENOUS | Status: AC
Start: 2017-07-20 — End: 2017-07-21
  Administered 2017-07-20: 23:00:00 1000 mL via INTRAVENOUS

## 2017-07-20 MED ORDER — SODIUM CHLORIDE 0.9 % IV SOLN
INTRAVENOUS | Status: AC
Start: 2017-07-20 — End: 2017-07-21
  Administered 2017-07-21: 1000 mL via INTRAVENOUS
  Filled 2017-07-20: qty 100

## 2017-07-20 MED ORDER — SODIUM CHLORIDE 0.9 % IV BOLUS
1000.0000 mL | Freq: Once | INTRAVENOUS | Status: AC
Start: 2017-07-20 — End: 2017-07-21

## 2017-07-20 MED ORDER — CEFTRIAXONE SODIUM 1 G IJ SOLR
1.0000 g | Freq: Once | INTRAMUSCULAR | Status: AC
Start: 2017-07-20 — End: 2017-07-20
  Administered 2017-07-20: 1 g via INTRAVENOUS
  Filled 2017-07-20: qty 1000

## 2017-07-20 NOTE — ED Notes (Signed)
ED MD with patient.

## 2017-07-20 NOTE — Discharge Instructions (Signed)
Dehydration    You have been diagnosed with dehydration.    Dehydration is when your body is low on fluids (liquids). Dehydration has a variety of causes. These range from vomiting and diarrhea, to excessive (a lot of) sweating and poor appetite.    You have received intravenous (IV) fluids. These are to help fix your dehydration. It is important to keep hydrating yourself at home. Drink plenty of fluids frequently. Drink fluids that won't upset your stomach. This includes water and juice. It also includes drinks like Gatorade. Stay away from beverages like soda pop and tea. They may make you worse. Avoid caffeine and alcohol. They may cause you to lose more fluid.    YOU SHOULD SEEK MEDICAL ATTENTION IMMEDIATELY, EITHER HERE OR AT THE NEAREST EMERGENCY DEPARTMENT, IF ANY OF THE FOLLOWING OCCURS:   Fever (temperature higher than 100.4F / 38C) or shaking chills.   Constant vomiting and/or diarrhea.   Lightheadedness or fainting.   If you do not urinate (pee) for 8 or more hours.             Urinary Tract Infection, Lower    You have been diagnosed with an uncomplicated lower urinary tract infection (UTI).     A UTI is an infection in your bladder. Your doctor diagnosed it by testing your urine. UTIs usually causes burning with urination (peeing) or frequent urination. It might make you feel like you have to urinate even when you don't.     UTI is usually treated with antibiotics and medicine to help with pain.    It is VERY IMPORTANT that you fill your prescription and take all of the antibiotics as directed. If a lower urinary tract infection goes untreated for too long, it can become a kidney infection.    FOR WOMEN: To reduce the risk of getting UTIs again:   Always urinate before and after sexual intercourse.   Always wipe from front to back after urinating or having a bowel movement. Do not wipe from back to front.   Drink plenty of fluids. Try to drink cranberry or blueberry juice. These  juices have a chemical that stops bacteria from sticking to the bladder.    YOU SHOULD SEEK MEDICAL ATTENTION IMMEDIATELY, EITHER HERE OR AT THE NEAREST EMERGENCY DEPARTMENT, IF ANY OF THE FOLLOWING OCCURS:   You have a fever (temperature higher than 100.4F / 38C) or shaking chills.   You feel nauseated or vomit.   You have pain in your side or back.   You don't get better after taking all of your antibiotics.   You have any new symptoms or concerns.   You feel worse or do not improve.    If you can't follow up with your doctor, or if at any time you feel you need to be rechecked or seen again, come back here or go to the nearest emergency department.

## 2017-07-20 NOTE — ED Triage Notes (Signed)
Brought by EMS, patient called due to concerns about dehydrated. States drank 3 bottles water since am & has not voided. Denies physical complaints. Reports has been living in her car x past 3 months & would like to be referred to homeless shelter.

## 2017-07-20 NOTE — ED Provider Notes (Signed)
Physician/Midlevel provider first contact with patient: 07/20/17 2212         EMERGENCY DEPARTMENT HISTORY AND PHYSICAL EXAM    Patient Name: Danielle Brown, Danielle Brown, 41 y.o., female  ED Provider: Louis Matte, M.D., FACEP    History of Presenting Illness:     Chief Complaint: Feels she may be dehydrated  History obtained from: Patient.  Onset/Duration: Today  Quality: Denies lightheadedness, dizziness, any pain anywhere  Severity:Mild  Aggravating Factors: She was living in her car for the last 3 months as she is homeless  Alleviating Factors: none  Associated Symptoms: No nausea, vomiting, chest pain, shortness of breath or other symptoms    Narrative/Additional Historical Findings:Danielle Brown is a 41 y.o. female  with no significant past medical history except for depression who is brought to the emergency department by EMS for the possibility of being dehydrated.  Patient is homeless and has been living in her car for the last 3 months.  Today, the temperatures were close to 100 degrees Fahrenheit and despite the fact that she was drinking a lot.  She feels she may be dehydrated.  She denies headaches, weakness, lightheadedness, nausea, vomiting, abdominal pain, focal weakness.  She is looking for a homeless shelter    Nursing notes from this date of service were reviewed.    Past Medical History:     Past Medical History:   Diagnosis Date   . Depression        Past Surgical History:   No past surgical history on file.    Family History:     Family History   Problem Relation Age of Onset   . Early death Mother    . Alcohol abuse Father    . Alcohol abuse Paternal Grandfather        Social History:     Social History     Social History   . Marital status: Single     Spouse name: N/A   . Number of children: N/A   . Years of education: N/A     Social History Main Topics   . Smoking status: Never Smoker   . Smokeless tobacco: Not on file   . Alcohol use No   . Drug use: No   . Sexual activity: Not Currently      Partners: Male     Birth control/ protection: Abstinence     Other Topics Concern   . Not on file     Social History Narrative   . No narrative on file       Allergies:   No Known Allergies    Medications:   No current facility-administered medications for this encounter.     Current Outpatient Prescriptions:   .  naproxen (NAPROSYN) 500 MG tablet, Take 1 tablet (500 mg total) by mouth 2 (two) times daily with meals., Disp: 20 tablet, Rfl: 0    Review of Systems:     Review of Systems   Constitutional: Negative for chills and fever.   HENT: Negative for congestion and sore throat.    Eyes: Negative for pain and redness.   Respiratory: Negative for cough and shortness of breath.    Cardiovascular: Negative for chest pain and palpitations.   Gastrointestinal: Negative for abdominal pain, nausea and vomiting.   Genitourinary: Negative for dysuria and frequency.   Musculoskeletal: Negative for back pain.   Neurological: Negative for dizziness and headaches.   Psychiatric/Behavioral: Negative.      Physical Exam:     ED  Triage Vitals   Enc Vitals Group      BP       Pulse       Resp       Temp       Temp src       SpO2       Weight       Height       Head Circumference       Peak Flow       Pain Score       Pain Loc       Pain Edu?       Excl. in GC?      Physical Exam   Constitutional: She is oriented to person, place, and time. She appears well-developed and well-nourished.   HENT:   Head: Normocephalic and atraumatic.   Eyes: Pupils are equal, round, and reactive to light. EOM are normal.   Neck: Normal range of motion. Neck supple.   Cardiovascular: Normal rate, regular rhythm and normal heart sounds.    Pulmonary/Chest: Effort normal and breath sounds normal.   Abdominal: Soft. Bowel sounds are normal. There is no tenderness. There is no CVA tenderness.   Neurological: She is alert and oriented to person, place, and time.   Skin: Skin is warm and dry.   Psychiatric: She has a normal mood and affect.     Labs:      Labs Reviewed   CBC AND DIFFERENTIAL - Abnormal; Notable for the following:        Result Value    Hgb 11.5 (*)     Hematocrit 35.8 (*)     MCH 26.5 (*)     All other components within normal limits   COMPREHENSIVE METABOLIC PANEL - Abnormal; Notable for the following:     Globulin 3.8 (*)     All other components within normal limits   URINALYSIS, REFLEX TO MICROSCOPIC EXAM IF INDICATED - Abnormal; Notable for the following:     Color, UA Amber (*)     Clarity, UA Sl Cloudy (*)     Leukocyte Esterase, UA Moderate (*)     Protein, UR 30 (*)     Ketones UA 5 (*)     Bilirubin, UA Small (*)     Blood, UA Moderate (*)     RBC, UA 6-10 (*)     WBC, UA 26-50 (*)     All other components within normal limits   HCG, SERUM, QUALITATIVE   LIPASE   GFR         Rads:     Radiology Results (24 Hour)     ** No results found for the last 24 hours. **          MDM and ED Course   Louis Matte, M.D., FACEP is the primary attending for this patient and has obtained and performed the history, PE, and medical decision making for this patient.    MDM:    DDX: Dehydration, doubt infection, doubt electrolyte abnormalities  Plan: IV fluids, labs, reevaluation, discussion with caseworker, discussion with psych liaison for homeless shelter options in the area    Patient Vitals for the past 24 hrs:   BP Temp Temp src Pulse Resp SpO2 Height Weight   07/21/17 0030 103/68 - - (!) 55 15 100 % - -   07/21/17 0015 103/68 - - (!) 49 - 100 % - -   07/20/17 2300 114/70 - - - -  100 % - -   07/20/17 2211 105/69 99 F (37.2 C) Oral 61 14 99 % 5\' 1"  (1.549 m) 54 kg     Patient treated for the urinary tract infection with IV ceftriaxone.  Given an additional prescription for Macrobid.  Attempted is to call caseworker who had ordered left for the night.  Discussed with the psych liaison at [facility] was going to fax resources for homeless shelters.  She continues to deny suicidal or homicidal ideation, is comfortable going back to her car for  tonight and will call tomorrow for homeless shelters.    Procedures    Nursing Notes: Reviewed and utilized available nursing notes.  Medical Records Reviewed: Reviewed available past medical records.  Counseling: The emergency provider has spoken with the patient and discussed today?s findings, in addition to providing specific details for the plan of care.  Questions are answered and there is agreement with the plan.          Assessment/Plan:   Results and instructions reviewed at the bedside with patient and family.    Clinical Impression  Final diagnoses:   Urinary tract infection with hematuria, site unspecified   Dehydration   Homelessness       Disposition  ED Disposition     ED Disposition Condition Date/Time Comment    Discharge  Wed Jul 20, 2017 11:47 PM Danielle Brown discharge to home/self care.    Condition at disposition: Stable          Prescriptions  There are no discharge medications for this patient.      Amount and/or Complexity of Data Reviewed  Clinical lab tests: ordered and reviewed  Tests in the radiology section of CPT: ordered and reviewed  Review and summarize past medical records: yes        Signed by: Oliver Barre, MD, FACEP  [facility]  [facility]    This note was generated by the Epic EMR/DRAGON speech recognition dictation system and may contain errors not intended by the user.  Random grammatical errors, pronoun errors and omissions may be a consequences of this technology due to software limitations.  Not all errors are caught or corrected.  If there are questions or concerns about the contents of this note, they should be addressed directly to the author of the note for clarification.           Oliver Barre, MD  07/21/17 [phone]

## 2017-07-21 MED ORDER — SODIUM CHLORIDE 0.9 % IV BOLUS
100.0000 mL | Freq: Once | INTRAVENOUS | Status: AC
Start: 2017-07-21 — End: 2017-07-21
  Administered 2017-07-21: 100 mL via INTRAVENOUS

## 2017-07-21 NOTE — ED Notes (Signed)
List of homeless shelters given to patient.

## 2017-07-21 NOTE — ED Notes (Signed)
Pt discharged to the waiting room with taxi voucher. Taxi has been called.

## 2017-11-26 ENCOUNTER — Inpatient Hospital Stay
Admission: EM | Admit: 2017-11-26 | Discharge: 2017-11-28 | DRG: 885 | Disposition: A | Discharge status: Home / Self Care | Payer: Self-pay | Attending: Psychiatry | Admitting: Psychiatry

## 2017-11-26 ENCOUNTER — Telehealth: Payer: Self-pay

## 2017-11-26 DIAGNOSIS — F209 Schizophrenia, unspecified: Secondary | ICD-10-CM

## 2017-11-26 DIAGNOSIS — F23 Brief psychotic disorder: Principal | ICD-10-CM | POA: Diagnosis present

## 2017-11-26 DIAGNOSIS — I10 Essential (primary) hypertension: Secondary | ICD-10-CM | POA: Diagnosis present

## 2017-11-26 DIAGNOSIS — F29 Unspecified psychosis not due to a substance or known physiological condition: Secondary | ICD-10-CM

## 2017-11-26 DIAGNOSIS — Z62810 Personal history of physical and sexual abuse in childhood: Secondary | ICD-10-CM | POA: Diagnosis present

## 2017-11-26 DIAGNOSIS — Z2821 Immunization not carried out because of patient refusal: Secondary | ICD-10-CM

## 2017-11-26 DIAGNOSIS — R9431 Abnormal electrocardiogram [ECG] [EKG]: Secondary | ICD-10-CM | POA: Diagnosis present

## 2017-11-26 DIAGNOSIS — Z609 Problem related to social environment, unspecified: Secondary | ICD-10-CM | POA: Diagnosis present

## 2017-11-26 DIAGNOSIS — Z9119 Patient's noncompliance with other medical treatment and regimen: Secondary | ICD-10-CM

## 2017-11-26 DIAGNOSIS — Z590 Homelessness: Secondary | ICD-10-CM

## 2017-11-26 LAB — CBC AND DIFFERENTIAL
Absolute NRBC: 0 10*3/uL
Basophils Absolute Automated: 0.02 10*3/uL (ref 0.00–0.20)
Basophils Automated: 0.4 %
Eosinophils Absolute Automated: 0.03 10*3/uL (ref 0.00–0.70)
Eosinophils Automated: 0.6 %
Hematocrit: 36.8 % — ABNORMAL LOW (ref 37.0–47.0)
Hgb: 11.2 g/dL — ABNORMAL LOW (ref 12.0–16.0)
Immature Granulocytes Absolute: 0.03 10*3/uL
Immature Granulocytes: 0.6 %
Lymphocytes Absolute Automated: 1.92 10*3/uL (ref 0.50–4.40)
Lymphocytes Automated: 36.6 %
MCH: 26.3 pg — ABNORMAL LOW (ref 28.0–32.0)
MCHC: 30.4 g/dL — ABNORMAL LOW (ref 32.0–36.0)
MCV: 86.4 fL (ref 80.0–100.0)
MPV: 11 fL (ref 9.4–12.3)
Monocytes Absolute Automated: 0.38 10*3/uL (ref 0.00–1.20)
Monocytes: 7.3 %
Neutrophils Absolute: 2.86 10*3/uL (ref 1.80–8.10)
Neutrophils: 54.5 %
Nucleated RBC: 0 /100 WBC (ref 0.0–1.0)
Platelets: 302 10*3/uL (ref 140–400)
RBC: 4.26 10*6/uL (ref 4.20–5.40)
RDW: 14 % (ref 12–15)
WBC: 5.24 10*3/uL (ref 3.50–10.80)

## 2017-11-26 LAB — URINALYSIS
Bilirubin, UA: NEGATIVE
Blood, UA: NEGATIVE
Glucose, UA: NEGATIVE
Nitrite, UA: NEGATIVE
Specific Gravity UA: 1.003 (ref 1.001–1.035)
Urine pH: 6 (ref 5.0–8.0)
Urobilinogen, UA: 0.2 mg/dL

## 2017-11-26 LAB — COMPREHENSIVE METABOLIC PANEL
ALT: 16 U/L (ref 0–55)
AST (SGOT): 17 U/L (ref 5–34)
Albumin/Globulin Ratio: 1.2 (ref 0.9–2.2)
Albumin: 4 g/dL (ref 3.5–5.0)
Alkaline Phosphatase: 69 U/L (ref 37–106)
Anion Gap: 9 (ref 5.0–15.0)
BUN: 17 mg/dL (ref 7.0–19.0)
Bilirubin, Total: 0.1 mg/dL — ABNORMAL LOW (ref 0.2–1.2)
CO2: 25 mEq/L (ref 22–29)
Calcium: 9.5 mg/dL (ref 8.5–10.5)
Chloride: 106 mEq/L (ref 100–111)
Creatinine: 1.1 mg/dL — ABNORMAL HIGH (ref 0.6–1.0)
Globulin: 3.3 g/dL (ref 2.0–3.6)
Glucose: 100 mg/dL (ref 70–100)
Potassium: 3.8 mEq/L (ref 3.5–5.1)
Protein, Total: 7.3 g/dL (ref 6.0–8.3)
Sodium: 140 mEq/L (ref 136–145)

## 2017-11-26 LAB — RAPID DRUG SCREEN, URINE
Barbiturate Screen, UR: NEGATIVE
Benzodiazepine Screen, UR: NEGATIVE
Cannabinoid Screen, UR: NEGATIVE
Cocaine, UR: NEGATIVE
Opiate Screen, UR: NEGATIVE
PCP Screen, UR: NEGATIVE
Urine Amphetamine Screen: NEGATIVE

## 2017-11-26 LAB — MAGNESIUM: Magnesium: 2.1 mg/dL (ref 1.6–2.6)

## 2017-11-26 LAB — ETHANOL: Alcohol: NOT DETECTED mg/dL

## 2017-11-26 LAB — PHOSPHORUS: Phosphorus: 2.4 mg/dL (ref 2.3–4.7)

## 2017-11-26 LAB — GFR: EGFR: >60

## 2017-11-26 LAB — TSH: TSH: 1.53 u[IU]/mL (ref 0.35–4.94)

## 2017-11-26 LAB — ACETAMINOPHEN LEVEL: Acetaminophen Level: <7 ug/mL — ABNORMAL LOW (ref 10–30)

## 2017-11-26 LAB — HCG, SERUM, QUALITATIVE: Hcg Qualitative: NEGATIVE

## 2017-11-26 LAB — URINE MICROSCOPIC: RBC, UA: 0 /hpf (ref 0–5)

## 2017-11-26 LAB — SALICYLATE LEVEL: Salicylate Level: <5 mg/dL — ABNORMAL LOW (ref 15.0–30.0)
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
## 2017-11-26 NOTE — ED Notes (Signed)
Moved video conference cart into room for [facility] to conduct their assessment

## 2017-11-27 DIAGNOSIS — F29 Unspecified psychosis not due to a substance or known physiological condition: Secondary | ICD-10-CM

## 2017-11-27 LAB — ECG 12-LEAD
Atrial Rate: 63 {beats}/min
P Axis: 70 degrees
P-R Interval: 134 ms
Q-T Interval: 392 ms
QRS Duration: 82 ms
QTC Calculation (Bezet): 401 ms
R Axis: 69 degrees
T Axis: 34 degrees
Ventricular Rate: 63 {beats}/min

## 2017-11-27 MED ORDER — LORAZEPAM 2 MG/ML IJ SOLN
1.00 mg | INTRAMUSCULAR | Status: DC | PRN
Start: 2017-11-27 — End: 2017-11-28

## 2017-11-27 MED ORDER — DIPHENHYDRAMINE HCL 25 MG PO CAPS
25.00 mg | ORAL_CAPSULE | ORAL | Status: DC | PRN
Start: 2017-11-27 — End: 2017-11-28

## 2017-11-27 MED ORDER — HALOPERIDOL LACTATE 5 MG/ML IJ SOLN
5.00 mg | INTRAMUSCULAR | Status: DC | PRN
Start: 2017-11-27 — End: 2017-11-28

## 2017-11-27 MED ORDER — HYDROXYZINE PAMOATE 25 MG PO CAPS
25.00 mg | ORAL_CAPSULE | ORAL | Status: DC | PRN
Start: 2017-11-27 — End: 2017-11-28

## 2017-11-27 MED ORDER — ACETAMINOPHEN 325 MG PO TABS
650.00 mg | ORAL_TABLET | Freq: Four times a day (QID) | ORAL | Status: DC | PRN
Start: 2017-11-27 — End: 2017-11-28

## 2017-11-27 MED ORDER — HALOPERIDOL 5 MG PO TABS
5.00 mg | ORAL_TABLET | ORAL | Status: DC | PRN
Start: 2017-11-27 — End: 2017-11-28

## 2017-11-27 MED ORDER — RISPERIDONE 1 MG PO TBDP
1.00 mg | ORAL_TABLET | Freq: Two times a day (BID) | ORAL | Status: DC
Start: 2017-11-27 — End: 2017-11-28
  Filled 2017-11-27 (×2): qty 1

## 2017-11-27 MED ORDER — LORAZEPAM 1 MG PO TABS
1.00 mg | ORAL_TABLET | ORAL | Status: DC | PRN
Start: 2017-11-27 — End: 2017-11-28

## 2017-11-27 MED ORDER — TRAZODONE HCL 50 MG PO TABS
50.00 mg | ORAL_TABLET | Freq: Every evening | ORAL | Status: DC | PRN
Start: 2017-11-27 — End: 2017-11-28
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
## 2017-11-27 NOTE — Plan of Care (Addendum)
Problem: Loss of functioning (Thought Disorder, Mood Disturbance and/or Severe Anxiety) AS EVIDENCED BY...  Goal: Will remain safe during hospitalization  Outcome: Progressing   11/27/17 1622   Goal/Interventions addressed this shift   Will remain safe during hospitalization  Verbalizes understanding of medication, benefits, and side effects;Provide medication teaching including name, dosage, benefits, action, effect and side effects   Isolating in room for most of this shift.  Disheveled.  Mood is guarded and evasive with restricted affect.  Difficult to engage in 1:1 conversation, staff avoidant.  Denies wanting to harm self or others.  Denies auditory or visual hallucinations.  Refused scheduled Risperidone disintegrating tablet when offered, despite much verbal encouragement to take it.  Patient stated, "My doctor said I don't have to take my medicine and I can refuse it."  Will continue to monitor behavior and maintain safety.

## 2017-11-27 NOTE — Plan of Care (Signed)
Problem: Loss of functioning (Thought Disorder, Mood Disturbance and/or Severe Anxiety) AS EVIDENCED BY...  Goal: Attends a minimum number of therapies daily  Outcome: Not Progressing   11/27/17 1711   Goal/Interventions addressed this shift   Attends a minimum number of therapies daily  Identify target number of therapies patient will attend daily as appropriate to functioning level;Encourage attendance and reinforce small successes in participation     On unit groups and interventions offered as follows:    Patient Attendance:    Orientation and Goal Group: NO  Problem Solving: NO    Mental Health Therapy Assessment/Progress Note:    Pt was invited to orientation group and acknowledged it. However, pt didn't show up. Pt did come to the second group, sitting at the corner and left group. Pt was able to tolerate group for 10 minutes. Affect was constricted. Mood was somewhat irritable and depressed. Staff will continue to assess and encourage pt to participate in group activities.

## 2017-11-27 NOTE — ED Notes (Signed)
Huntley Dec from [facility] at bedside for consult.

## 2017-11-27 NOTE — ED Notes (Signed)
Voluntary admission consent faxed to [facility]

## 2017-11-27 NOTE — Progress Notes (Signed)
0026--this Clinical research associate completed TDO petition. [facility] Bosie Clos has petition and will continue to follow case.

## 2017-11-27 NOTE — ED Notes (Signed)
1:39 AM  Writer spoke with Danielle Brown from [facility]. Per Danielle Brown, patient is orientated and has capacity and is voluntary. Patient understands hospitalization process. Writer relayed information to Dr. Charissa Bash. He will accept patient. Unit informed. MD ED informed and will have patient sign voluntary form for [facility]. Patient does not have insurance so no pre-authorization will be needed.

## 2017-11-27 NOTE — ED Notes (Signed)
Gave report to [place] at [facility].  Olegario Messier requests delay transfer of pt to [facility] for 30-74min d/t another admission that needs to be processed.
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
## 2017-11-28 ENCOUNTER — Encounter: Payer: Self-pay | Admitting: Family

## 2017-11-28 DIAGNOSIS — F23 Brief psychotic disorder: Principal | ICD-10-CM

## 2017-11-28 LAB — LIPID PANEL
Cholesterol / HDL Ratio: 2.8
Cholesterol: 172 mg/dL (ref 0–199)
HDL: 61 mg/dL (ref 40–9999)
LDL Calculated: 101 mg/dL — ABNORMAL HIGH (ref 0–99)
Triglycerides: 52 mg/dL (ref 34–149)
VLDL Calculated: 10 mg/dL (ref 10–40)

## 2017-11-28 LAB — HEMOLYSIS INDEX: Hemolysis Index: 3 (ref 0–18)

## 2017-11-28 LAB — GLUCOSE, FASTING: Glucose Fasting: 78 mg/dL (ref 70–100)

## 2017-11-28 MED ORDER — VITAMINS/MINERALS PO TABS
1.00 | ORAL_TABLET | Freq: Every day | ORAL | Status: DC
Start: 2017-11-28 — End: 2017-11-28

## 2017-11-28 MED ORDER — ENOXAPARIN SODIUM 40 MG/0.4ML SC SOLN
40.00 mg | Freq: Every day | SUBCUTANEOUS | Status: DC
Start: 2017-11-28 — End: 2017-11-28
  Filled 2017-11-28 (×2): qty 0.4
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
## 2017-11-28 NOTE — Plan of Care (Signed)
Problem: Loss of functioning (Thought Disorder, Mood Disturbance and/or Severe Anxiety) AS EVIDENCED BY...  Goal: Will remain safe during hospitalization  Outcome: Adequate for Discharge      Problem: Thought Disorder  Goal: Demonstrates orientation to person place and time  Outcome: Adequate for Discharge   11/28/17 1804   Goal/Interventions addressed this shift   Demonstrates orientation to person, place, and time  Monitor patient's orientation status every shift;Re-orient the patient to environment as needed   Patient isolative this shift.  Mood is depressed, guarded, evasive with constricted affect.  Disheveled and unkempt with poor hygiene.  Refused scheduled medication when offered, despite much verbal encouragement to take it.  Patient stated, "My doctor said I don't have to take it."  "It doesn't work."  Out of room for meals only.  Patient is being discharged today and will have taxi take her to [facility] in [place].  Will continue to monitor behavior and maintain safety.

## 2017-11-28 NOTE — Discharge Instr - Activity (Signed)
Activity as tolerated

## 2017-11-28 NOTE — Plan of Care (Signed)
Problem: Loss of functioning (Thought Disorder, Mood Disturbance and/or Severe Anxiety) AS EVIDENCED BY...  Goal: Will remain safe during hospitalization  Outcome: Progressing   11/27/17 2100   Goal/Interventions addressed this shift   Will remain safe during hospitalization  Verbalizes understanding of medication, benefits, and side effects;Provide medication teaching including name, dosage, benefits, action, effect and side effects     Goal: Maintains adequate nutrition/hydration  Outcome: Progressing   11/27/17 2100   Goal/Interventions addressed this shift   Maintains adequate nutrition/hydration  Monitor intake and output every shift   Patient came out of her room at snack time, had pizza and juice.

## 2017-11-28 NOTE — Progress Notes (Signed)
Patient discharged to [facility] via [employer].  All discharge paperwork reviewed with and signed by patient with positive feedback.  Denies wanting to harm self.  Encouraged to return to ER or call 911 if not feeling safe.

## 2017-11-28 NOTE — Discharge Instr - Other Orders (Signed)
None

## 2017-11-28 NOTE — Discharge Instructions (Signed)
None

## 2017-11-28 NOTE — Discharge Instr - Diet (Signed)
Regular diet

## 2017-11-28 NOTE — Plan of Care (Signed)
All care plan goals met and satisfied.
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
## 2017-11-28 NOTE — Discharge Instr - Lab (Signed)
None
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
## 2018-01-06 ENCOUNTER — Emergency Department: Payer: Self-pay

## 2018-01-06 ENCOUNTER — Emergency Department
Admission: EM | Admit: 2018-01-06 | Discharge: 2018-01-06 | Disposition: A | Discharge status: Home / Self Care | Payer: Self-pay | Attending: Emergency Medicine | Admitting: Emergency Medicine

## 2018-01-06 DIAGNOSIS — J40 Bronchitis, not specified as acute or chronic: Secondary | ICD-10-CM | POA: Insufficient documentation

## 2018-01-06 MED ORDER — ACETAMINOPHEN 500 MG PO TABS
1000.00 mg | ORAL_TABLET | Freq: Once | ORAL | Status: AC
Start: 2018-01-06 — End: 2018-01-06
  Administered 2018-01-06: 04:00:00 1000 mg via ORAL
  Filled 2018-01-06: qty 2

## 2018-01-06 NOTE — Discharge Instructions (Signed)
Bronchitis    You have been diagnosed with bronchitis.    Bronchitis is an irritation of the large breathing tubes. It can be caused by tobacco smoke, air pollution, or an infection. Patients with bronchitis are short of breath and may cough up green or yellow mucous. These symptoms are usually worse at night, when lying flat and in wet weather. Most people with bronchitis do not need antibiotics. If your doctor prescribes antibiotics, fill the prescription and take all the medicine according to the instructions.    Bronchitis is usually treated with medicine to help stop coughing. An inhaler with albuterol (Ventolin/Proventil) is sometimes used to help with cough. It is best to use the inhaler with a spacer to help the medicine reach your lungs. The doctor can prescribe a spacer.    Bronchitis is usually caused by a virus. Antibiotics do not kill viruses. In fact, antibiotics do not affect viruses in any way. In the past, some doctors prescribed antibiotics for people with bronchitis. We now know that antibiotics are not helpful for most bronchitis patients. Patients who might need antibiotics are those with lung problems that don't go away, like emphysema or COPD.    Your coughing and wheezing might last for 2 or 3 weeks! The symptoms should get better over this time period and not worse.    Do not smoke. Research shows that smoking causes heart disease, cancer, and birth defects. Avoiding smoking will help your asthma. If you smoke, ask your doctor for ideas about how to stop.   If you do not smoke, avoid others who do.    YOU SHOULD SEEK MEDICAL ATTENTION IMMEDIATELY, EITHER HERE OR AT THE NEAREST EMERGENCY DEPARTMENT, IF ANY OF THE FOLLOWING OCCURS:   You wheeze or have trouble breathing.   You have a fever (temperature higher than 100.4F / 38C), that won't go away.   You have chest pain.   You vomit or cannot keep liquids down or you feel weak or dizzy.   Your symptoms get worse over the next  2 or 3 days.

## 2018-01-06 NOTE — ED Notes (Signed)
Pt given resources for shelters, SW to help place pt in shelter upon discharge

## 2018-01-06 NOTE — ED Provider Notes (Signed)
[facility] EMERGENCY DEPARTMENT H&P      Visit date: 01/06/2018      CLINICAL SUMMARY          Diagnosis:    .     Final diagnoses:   None         MDM Notes:      I am the first provider for this patient.  I reviewed the vital signs, nursing notes, past medical history, past surgical history, family history and social history.  I have reviewed the patient's previous charts.  Reassessment:   Re-evaluated patient, shared results, answered patient questions, reviewed disposition plan.  A stabilizing H&P was performed.Pt. stable with treatment offered in the department and an emergency medical evaluation did not reveal any immediate life or pulmonic threats. Patient appears to have bronchitis without significant bronchospasm, pneumonia, or other serious bacterial infection. Will d/c.  Disposition and f/u options were outlined. They were instructed to follow up with clinicdoctor in 2 days and return to the ER if they developed any new symptoms or if they had any other concerns. They verbalized understanding of the plan of care and felt comfortable with the plan.           Disposition:         Discharge         Discharge Prescriptions     None                      CLINICAL INFORMATION        HPI:      Chief Complaint: Headache and Knee Pain  .    ZO LOUDON is a 42 y.o. female with PMHx including depression, psychosis; BIBA; who presents with onset of LT sided headache 3 hours PTA a/w a few days of cough.     Believes she had the flu the past few days but has been improving.   No meds taken PTA.   Denies V/D, fever. -Drugs.     History obtained from: Patient          ROS:      Positive and Negative ROS elements as per HPI  All Other Systems Reviewed and Negative: Yes      Physical Exam:      Pulse 66  BP 112/57  Resp 16  SpO2 100 %  Temp 97.6 F (36.4 C)    Physical Exam   Nursing note and vitals reviewed.  Constitutional:Pt is oriented to person, place, and time. Pt appears well-developed and  well-nourished. No distress.   HEENT:   Head: Normocephalic and atraumatic.   Right Ear: External ear normal.   Left Ear: External ear normal.   Nose: Nose normal.   Mouth/Throat: Oropharynx is clear and moist. No oropharyngeal exudate.   Eyes: Conjunctivae and EOM are normal. Pupils are equal, round, and reactive to light. Right eye exhibits no discharge. Left eye exhibits no discharge. No scleral icterus.   Neck: Normal range of motion. Neck supple. No JVD present. No tracheal deviation present. No thyromegaly present.   Cardiovascular: Normal rate, regular rhythm, normal heart sounds and intact distal pulses.  Exam reveals no gallop and no friction rub.    No murmur heard.  Pulmonary/Chest: Effort normal. No stridor. No respiratory distress. Pt has no wheezes. Pt exhibits no tenderness. coarse rhonchi in upper lobes.   Abdominal: There is no tenderness. Soft. Bowel sounds are normal. Pt exhibits no distension and no mass.  There is  no rebound and no guarding.   Musculoskeletal:  No edema. FROM  Neurological:  No cranial nerve deficit. Pt exhibits normal muscle tone. Coordination normal.   Skin: Skin is warm and dry. Pt is not diaphoretic.   Psychiatric: Pt has a normal mood and affect. Behavior is normal. Judgment and thought content normal            PAST HISTORY        Primary Care Provider: Christa See, MD        PMH/PSH:    .     Past Medical History:   Diagnosis Date   . Depression    . Psychosis 2013       She has no past surgical history on file.      Social/Family History:      She reports that she has never smoked. She has never used smokeless tobacco. She reports that she does not drink alcohol or use drugs.    Family History   Problem Relation Age of Onset   . Early death Mother    . Alcohol abuse Father    . Alcohol abuse Paternal Grandfather          Listed Medications on Arrival:    .     Home Medications     No Medications         Allergies: She has No Known Allergies.            VISIT  INFORMATION        Clinical Course in the ED:             Medications Given in the ED:    .     ED Medication Orders     None            Procedures:      Procedures      Interpretations:      Pulse ox reviewed by me. All ordered labs and images reviewed by me.     O2 sat-           saturation: 100 %; Oxygen use: room air; Interpretation: Normal                   RESULTS        Lab Results:      Results     ** No results found for the last 24 hours. **              Radiology Results:      No orders to display               Scribe Attestation:      I was acting as a Neurosurgeon for Arlana Lindau, MD on Rosalyn Charters D  Treatment team: Scribe: Orinda Kenner     I am the first provider for this patient and I personally performed the services documented. Orinda Kenner is scribing for me on Grimmett,Kortlynn D. This note accurately reflects work and decisions made by me.  Arlana Lindau, MD          Arlana Lindau, MD  01/06/18 [phone]

## 2018-01-06 NOTE — Progress Notes (Signed)
SW consulted to assist with shelter resources. SW provided a list of local winter shelters for the patient. Patient is agreeable to go to [facility]. Patient will also need assistance with transportation. SW will provide a cab voucher once the patient is discharged and in the ER lobby.              Systems Case Management Progress Note:     Type of Services Provider Name   Provider Phone Number   Length of Need SCM approved by:  Comments:   Skilled Nursing Facility "SNF"        Assisted Living        LTAC        Dialysis        Home Health        Infusion        DME        Medications        [place] [phone] 1x Everardo Pacific, MSW    Non-skilled Care ie Private Duty Aide            Everardo Pacific, MSW  Emergency Department Social Worker  [facility]  [phone]  [email or web address]

## 2018-01-07 ENCOUNTER — Emergency Department: Payer: Self-pay

## 2018-01-07 ENCOUNTER — Emergency Department
Admission: EM | Admit: 2018-01-07 | Discharge: 2018-01-07 | Disposition: A | Discharge status: Home / Self Care | Payer: Self-pay | Attending: Emergency Medicine | Admitting: Emergency Medicine

## 2018-01-07 DIAGNOSIS — R079 Chest pain, unspecified: Secondary | ICD-10-CM | POA: Insufficient documentation

## 2018-01-07 DIAGNOSIS — R51 Headache: Secondary | ICD-10-CM | POA: Insufficient documentation

## 2018-01-07 DIAGNOSIS — G4489 Other headache syndrome: Secondary | ICD-10-CM

## 2018-01-07 DIAGNOSIS — Z5900 Homelessness unspecified: Secondary | ICD-10-CM

## 2018-01-07 LAB — COMPREHENSIVE METABOLIC PANEL
ALT: 16 U/L (ref 0–55)
AST (SGOT): 22 U/L (ref 5–34)
Albumin/Globulin Ratio: 1 (ref 0.9–2.2)
Albumin: 3.5 g/dL (ref 3.5–5.0)
Alkaline Phosphatase: 61 U/L (ref 37–106)
Anion Gap: 7 (ref 5.0–15.0)
BUN: 24 mg/dL — ABNORMAL HIGH (ref 7.0–19.0)
Bilirubin, Total: 0.2 mg/dL (ref 0.2–1.2)
CO2: 27 mEq/L (ref 22–29)
Calcium: 9.4 mg/dL (ref 8.5–10.5)
Chloride: 106 mEq/L (ref 100–111)
Creatinine: 1 mg/dL (ref 0.6–1.0)
Globulin: 3.6 g/dL (ref 2.0–3.6)
Glucose: 99 mg/dL (ref 70–100)
Potassium: 4.8 mEq/L (ref 3.5–5.1)
Protein, Total: 7.1 g/dL (ref 6.0–8.3)
Sodium: 140 mEq/L (ref 136–145)

## 2018-01-07 LAB — CBC AND DIFFERENTIAL
Absolute NRBC: 0 10*3/uL
Basophils Absolute Automated: 0.03 10*3/uL (ref 0.00–0.20)
Basophils Automated: 0.6 %
Eosinophils Absolute Automated: 0.13 10*3/uL (ref 0.00–0.70)
Eosinophils Automated: 2.5 %
Hematocrit: 33.2 % — ABNORMAL LOW (ref 37.0–47.0)
Hgb: 10.1 g/dL — ABNORMAL LOW (ref 12.0–16.0)
Immature Granulocytes Absolute: 0.01 10*3/uL
Immature Granulocytes: 0.2 %
Lymphocytes Absolute Automated: 1.66 10*3/uL (ref 0.50–4.40)
Lymphocytes Automated: 32.3 %
MCH: 26.2 pg — ABNORMAL LOW (ref 28.0–32.0)
MCHC: 30.4 g/dL — ABNORMAL LOW (ref 32.0–36.0)
MCV: 86.2 fL (ref 80.0–100.0)
MPV: 10.6 fL (ref 9.4–12.3)
Monocytes Absolute Automated: 0.43 10*3/uL (ref 0.00–1.20)
Monocytes: 8.4 %
Neutrophils Absolute: 2.88 10*3/uL (ref 1.80–8.10)
Neutrophils: 56 %
Nucleated RBC: 0 /100 WBC (ref 0.0–1.0)
Platelets: 301 10*3/uL (ref 140–400)
RBC: 3.85 10*6/uL — ABNORMAL LOW (ref 4.20–5.40)
RDW: 14 % (ref 12–15)
WBC: 5.14 10*3/uL (ref 3.50–10.80)

## 2018-01-07 LAB — HEMOLYSIS INDEX: Hemolysis Index: 95 — ABNORMAL HIGH (ref 0–18)

## 2018-01-07 LAB — HCG QUANTITATIVE: hCG, Quant.: <1.2

## 2018-01-07 LAB — GFR: EGFR: >60

## 2018-01-07 LAB — TROPONIN I: Troponin I: <0.01 ng/mL (ref 0.00–0.09)

## 2018-01-07 MED ORDER — ACETAMINOPHEN 500 MG PO TABS
1000.00 mg | ORAL_TABLET | Freq: Once | ORAL | Status: AC
Start: 2018-01-07 — End: 2018-01-07
  Administered 2018-01-07: 03:00:00 1000 mg via ORAL
  Filled 2018-01-07: qty 2

## 2018-01-07 NOTE — Discharge Instructions (Signed)
You were seen in the [facility] Emergency Department by Dr. Michel Harrow.       For home care, you can take Tylenol as needed for pain.    Please make sure to return if you have fever, vomiting, chest pain, trouble breathing, difficulty walking, if you are not getting better, or any other concerns.  We are always open and happy to help.    Please be aware that an emergency department diagnosis is usually preliminary, and that a definitive diagnosis cannot be made without the help of time or further testing.  Every disease has a progression and may take time before it is recognizable by a physician.  It is extremely important that you return to the Emergency Department or see your own doctor if you do not improve, or especially if your symptoms worsen or change.     In short, do not hesitate to return to the emergency department if you sense something is not right. We are never upset to see you again.    Take your discharge instructions to your primary care doctor and all other follow-up care so they have a record of what happened in your visit today. Emergency care is not a substitute for general medical care.      Headache    You have been treated for a headache.    Headaches are very common. Most of the time they are benign (not harmful). Some headaches can be very serious. Your headache appears to be benign. The doctor feels it is OK for you to go home.    If you continue to have headaches, or if this headache does not resolve over the next few days, you should be evaluated by your regular doctor or a neurologist. Keep a "headache diary." This may help your doctor learn the cause of your headaches.    Take your headache medication as directed. This is especially important if your doctor has placed you on a daily medication to prevent headaches.    YOU SHOULD SEEK MEDICAL ATTENTION IMMEDIATELY, EITHER HERE OR AT THE NEAREST EMERGENCY DEPARTMENT, IF ANY OF THE FOLLOWING OCCURS:   Your headache gets  worse.   You have a severe headache that occurs suddenly.   Your head pain is different from your normal headache.   You have a fever (temperature higher than 100.19F / 38C), especially with a stiff neck.   You feel numbness, tingling, or weakness in your arms or legs.   You pass out.   You have problems with your vision.   You vomit and have trouble taking medication or keeping it down.               Chest Pain of Unclear Etiology    You have been seen for chest pain. The cause of your pain is not yet known.    Your doctor has learned about your medical history, examined you, and checked any tests that were done. Still, it is unclear why you are having pain. The doctor thinks there is only a very small chance that your pain is caused by a life-threatening condition. Later, your primary care doctor might do more tests or check you again.    Sometimes chest pain is caused by a dangerous condition, like a heart attack, aorta injury, blood clot in the lung, or collapsed lung. It is unlikely that your pain is caused by a life-threatening condition if: Your chest pain lasts only a few seconds at a time; you are not  short of breath, nauseated (sick to your stomach), sweaty, or lightheaded; your pain gets worse when you twist or bend; your pain improves with exercise or hard work.    Chest pain is serious. It is VERY IMPORTANT that you follow up with your regular doctor and seek medical attention immediately here or at the nearest Emergency Department if your symptoms become worse or they change.    YOU SHOULD SEEK MEDICAL ATTENTION IMMEDIATELY, EITHER HERE OR AT THE NEAREST EMERGENCY DEPARTMENT, IF ANY OF THE FOLLOWING OCCURS:   Your pain gets worse.   Your pain makes you short of breath, nauseated, or sweaty.   Your pain gets worse when you walk, go up stairs, or exert yourself.   You feel weak, lightheaded, or faint.   It hurts to breathe.   Your leg swells.   Your symptoms get worse or you have new  symptoms or concerns.          Headache    You have been treated for a headache.    Headaches are very common. Most of the time they are benign (not harmful). Some headaches can be very serious. Your headache appears to be benign. The doctor feels it is OK for you to go home.    If you continue to have headaches, or if this headache does not resolve over the next few days, you should be evaluated by your regular doctor or a neurologist. Keep a "headache diary." This may help your doctor learn the cause of your headaches.    Take your headache medication as directed. This is especially important if your doctor has placed you on a daily medication to prevent headaches.    YOU SHOULD SEEK MEDICAL ATTENTION IMMEDIATELY, EITHER HERE OR AT THE NEAREST EMERGENCY DEPARTMENT, IF ANY OF THE FOLLOWING OCCURS:   Your headache gets worse.   You have a severe headache that occurs suddenly.   Your head pain is different from your normal headache.   You have a fever (temperature higher than 100.41F / 38C), especially with a stiff neck.   You feel numbness, tingling, or weakness in your arms or legs.   You pass out.   You have problems with your vision.   You vomit and have trouble taking medication or keeping it down.

## 2018-01-07 NOTE — EDIE (Signed)
COLLECTIVE?NOTIFICATION?01/07/2018 02:06?LEXUS, SHAMPINE D?MRN: [id number]    Criteria Met      3 Different Facilities in 90 Days    Security and Safety  No recent Security Events currently on file    ED Care Guidelines  There are currently no ED Care Guidelines for this patient. Please check your facility's medical records system.      Prescription Drug Report (12 Mo.)  PDMP query found no report.      E.D. Visit Count (12 mo.)  Facility Visits   [place]- [facility] 1   [place] - [facility] 1   [place] [facility]: [facility] Cost[facility]) 1   [place] [facility] 1   Total 4   Note: Visits indicate total known visits.      Recent Emergency Department Visit Summary  Date Facility [facility] Type Diagnoses or Chief Complaint   Jan 07, 2018 [place] [facility] [place] [facility] [place] [facility]      headache, chest pain      Jan 06, 2018 [place] [facility] [place] [facility] [place] [place] [facility]      medic-Headache Pt #1 of 2      Knee Pain      Headache      Bronchitis, not specified as acute or chronic      Nov 26, 2017 [place] [facility]: [facility]) [place] [place] [facility]      pysch evaluation      Psychiatric Evaluation      Brief psychotic disorder      Jul 20, 2017 - [place] [place] [facility]      dehydration      Hematuria, unspecified      Urinary tract infection, site not specified      Dehydration      Homelessness          Recent Inpatient Visit Summary  No recorded inpatient visits.     Care Providers  There are no care providers on record at this time.   Collective Portal  This patient has registered at the [facility] Emergency Department   For more information visit: [email or web address]     The above information is provided for the sole purpose of patient treatment. Use of this information beyond the terms of Data Sharing Memorandum of Understanding and License Agreement is prohibited. In certain cases not all visits may be represented.  Consult the aforementioned facilities for additional information.   ? 2019 [employer] - [place], [place] - [email or web address]

## 2018-01-07 NOTE — ED Triage Notes (Signed)
Danielle Brown is a 42 y.o. female BIBA with headache and chest pain prior to EMS arrival.  CP resolved prior to transport.  Currently with Headache only.  Seen yesterday at [facility] for same.    BP 111/65   Pulse (!) 59   Temp 97.7 F (36.5 C) (Oral)   Resp 18   Wt 54 kg   LMP 12/23/2017   SpO2 100%   BMI 22.49 kg/m '
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
## 2018-01-07 NOTE — ED Notes (Signed)
Bed: BL17  Expected date:   Expected time:   Means of arrival:   Comments:  MEDIC 410 headache/chest pain

## 2018-01-10 LAB — ECG 12-LEAD
Atrial Rate: 57 {beats}/min
P Axis: 79 degrees
P-R Interval: 116 ms
Q-T Interval: 424 ms
QRS Duration: 78 ms
QTC Calculation (Bezet): 412 ms
R Axis: 71 degrees
T Axis: 46 degrees
Ventricular Rate: 57 {beats}/min

## 2018-02-12 ENCOUNTER — Emergency Department
Admission: EM | Admit: 2018-02-12 | Discharge: 2018-02-12 | Discharge status: Against Medical Advice | Payer: Charity | Attending: Emergency Medicine | Admitting: Emergency Medicine

## 2018-02-12 ENCOUNTER — Emergency Department
Admission: EM | Admit: 2018-02-12 | Discharge: 2018-02-12 | Disposition: A | Discharge status: Home / Self Care | Payer: Charity | Attending: Emergency Medicine | Admitting: Emergency Medicine

## 2018-02-12 ENCOUNTER — Inpatient Hospital Stay (EMERGENCY_DEPARTMENT_HOSPITAL): Payer: Self-pay

## 2018-02-12 DIAGNOSIS — Z539 Procedure and treatment not carried out, unspecified reason: Secondary | ICD-10-CM | POA: Insufficient documentation

## 2018-02-12 DIAGNOSIS — M79603 Pain in arm, unspecified: Secondary | ICD-10-CM

## 2018-02-12 DIAGNOSIS — Z5321 Procedure and treatment not carried out due to patient leaving prior to being seen by health care provider: Secondary | ICD-10-CM | POA: Insufficient documentation

## 2018-02-12 DIAGNOSIS — Z139 Encounter for screening, unspecified: Secondary | ICD-10-CM

## 2018-02-12 DIAGNOSIS — Z008 Encounter for other general examination: Secondary | ICD-10-CM

## 2018-02-12 DIAGNOSIS — F209 Schizophrenia, unspecified: Secondary | ICD-10-CM

## 2018-02-12 LAB — GFR: EGFR: >60

## 2018-02-12 LAB — RAPID DRUG SCREEN, URINE
Barbiturate Screen, UR: NEGATIVE
Benzodiazepine Screen, UR: NEGATIVE
Cannabinoid Screen, UR: NEGATIVE
Cocaine, UR: NEGATIVE
Opiate Screen, UR: NEGATIVE
PCP Screen, UR: NEGATIVE
Urine Amphetamine Screen: NEGATIVE

## 2018-02-12 LAB — COMPREHENSIVE METABOLIC PANEL
ALT: 22 U/L (ref 0–55)
AST (SGOT): 22 U/L (ref 5–34)
Albumin/Globulin Ratio: 1.2 (ref 0.9–2.2)
Albumin: 4.4 g/dL (ref 3.5–5.0)
Alkaline Phosphatase: 69 U/L (ref 37–106)
BUN: 15 mg/dL (ref 7.0–19.0)
Bilirubin, Total: 0.3 mg/dL (ref 0.2–1.2)
CO2: 22 mEq/L (ref 22–29)
Calcium: 9.5 mg/dL (ref 8.5–10.5)
Chloride: 106 mEq/L (ref 100–111)
Creatinine: 0.8 mg/dL (ref 0.6–1.0)
Globulin: 3.6 g/dL (ref 2.0–3.6)
Glucose: 76 mg/dL (ref 70–100)
Potassium: 4 mEq/L (ref 3.5–5.1)
Protein, Total: 8 g/dL (ref 6.0–8.3)
Sodium: 137 mEq/L (ref 136–145)

## 2018-02-12 LAB — URINALYSIS, REFLEX TO MICROSCOPIC EXAM IF INDICATED
Bilirubin, UA: NEGATIVE
Glucose, UA: NEGATIVE
Ketones UA: NEGATIVE
Leukocyte Esterase, UA: NEGATIVE
Nitrite, UA: NEGATIVE
Protein, UR: NEGATIVE
Specific Gravity UA: 1.013 (ref 1.001–1.035)
Urine pH: 6 (ref 5.0–8.0)
Urobilinogen, UA: NORMAL mg/dL

## 2018-02-12 LAB — CBC AND DIFFERENTIAL
Absolute NRBC: 0 10*3/uL (ref 0.00–0.00)
Basophils Absolute Automated: 0.03 10*3/uL (ref 0.00–0.08)
Basophils Automated: 0.6 %
Eosinophils Absolute Automated: 0.01 10*3/uL (ref 0.00–0.44)
Eosinophils Automated: 0.2 %
Hematocrit: 38.4 % (ref 34.7–43.7)
Hgb: 11.9 g/dL (ref 11.4–14.8)
Immature Granulocytes Absolute: 0.02 10*3/uL (ref 0.00–0.07)
Immature Granulocytes: 0.4 %
Lymphocytes Absolute Automated: 1.37 10*3/uL (ref 0.42–3.22)
Lymphocytes Automated: 25.2 %
MCH: 26.3 pg (ref 25.1–33.5)
MCHC: 31 g/dL — ABNORMAL LOW (ref 31.5–35.8)
MCV: 85 fL (ref 78.0–96.0)
MPV: 10.7 fL (ref 8.9–12.5)
Monocytes Absolute Automated: 0.32 10*3/uL (ref 0.21–0.85)
Monocytes: 5.9 %
Neutrophils Absolute: 3.68 10*3/uL (ref 1.10–6.33)
Neutrophils: 67.7 %
Nucleated RBC: 0 /100 WBC (ref 0.0–0.0)
Platelets: 318 10*3/uL (ref 142–346)
RBC: 4.52 10*6/uL (ref 3.90–5.10)
RDW: 15 % (ref 11–15)
WBC: 5.43 10*3/uL (ref 3.10–9.50)

## 2018-02-12 LAB — ECG 12-LEAD
Atrial Rate: 54 {beats}/min
P Axis: 65 degrees
P-R Interval: 120 ms
Q-T Interval: 422 ms
QRS Duration: 76 ms
QTC Calculation (Bezet): 400 ms
R Axis: 66 degrees
T Axis: 38 degrees
Ventricular Rate: 54 {beats}/min

## 2018-02-12 LAB — SALICYLATE LEVEL: Salicylate Level: <5 mg/dL — ABNORMAL LOW (ref 15.0–30.0)

## 2018-02-12 LAB — MAGNESIUM: Magnesium: 1.9 mg/dL (ref 1.6–2.6)

## 2018-02-12 LAB — ETHANOL: Alcohol: NOT DETECTED mg/dL

## 2018-02-12 LAB — PHOSPHORUS: Phosphorus: 3.9 mg/dL (ref 2.3–4.7)

## 2018-02-12 LAB — ACETAMINOPHEN LEVEL: Acetaminophen Level: <7 ug/mL — ABNORMAL LOW (ref 10–30)

## 2018-02-12 LAB — TSH: TSH: 0.81 u[IU]/mL (ref 0.35–4.94)

## 2018-02-12 NOTE — Discharge Instructions (Signed)
Dear Danielle Brown:    Thank you for choosing the [facility] Emergency Department, the premier emergency department in the [place] area.  I hope your visit today was EXCELLENT.    Specific instructions for your visit today:      Medical Screening Exam    You have been given a medical screening exam. At this time your condition seems to be safe to treat in a non-emergency setting.    Sometimes your doctors will do a medical screening examination to decide that you don't have a life-threatening medical problem. The exam is done to make sure that it would be safe for your condition to be treated in a non-emergency setting. This kind of setting may include a family doctor's office, an Urgent Care or outpatient clinic.    Though we don't believe your condition is serious right now, it is important to be careful. Sometimes a problem that seems mild can become serious later. This is why it is very important that you return here or go to the nearest Emergency Department if you are not improving or your symptoms are getting worse.    YOU SHOULD SEEK MEDICAL ATTENTION IMMEDIATELY, EITHER HERE OR AT THE NEAREST EMERGENCY DEPARTMENT, IF ANY OF THE FOLLOWING OCCUR:     Your symptoms become worse.   You feel you have developed another medical emergency.             If you do not continue to improve or your condition worsens, please contact your doctor or return immediately to the Emergency Department.    Sincerely,  Aslani-Amoli, Toma Deiters, MD  Attending Emergency Physician  [facility] Emergency Department    ONSITE PHARMACY  Our full service onsite pharmacy is located in the ER waiting room.  Open 7 days a week from 9 am to 9 pm.  We accept all major insurances and prices are competitive with major retailers.  Ask your provider to print your prescriptions down to the pharmacy to speed you on your way home.    OBTAINING A PRIMARY CARE APPOINTMENT    Primary care physicians (PCPs, also known as primary care  doctors) are either internists or family medicine doctors. Both types of PCPs focus on health promotion, disease prevention, patient education and counseling, and treatment of acute and chronic medical conditions.    Call for an appointment with a primary care doctor.  Ask to see who is taking new patients.     [facility]  telephone:  [phone]  [email or web address]    DOCTOR REFERRALS  Call [phone] (available 24 hours a day, 7 days a week) if you need any further referrals and we can help you find a primary care doctor or specialist.  Also, available online at:  [email or web address]    YOUR CONTACT INFORMATION  Before leaving please check with registration to make sure we have an up-to-date contact number.  You can call registration at [phone] to update your information.  For questions about your hospital bill, please call [phone].  For questions about your Emergency Dept Physician bill please call [phone].      FREE HEALTH SERVICES  If you need help with health or social services, please call 2-1-1 for a free referral to resources in your area.  2-1-1 is a free service connecting people with information on health insurance, free clinics, pregnancy, mental health, dental care, food assistance, housing, and substance abuse counseling.  Also, available online at:  [email or web address]  AND TESTS  Certain laboratory test results do not come back the same day, for example urine cultures.   We will contact you if other important findings are noted.  Radiology films are often reviewed again to ensure accuracy.  If there is any discrepancy, we will notify you.      Please call [phone] to pick up a complimentary CD of any radiology studies performed.  If you or your doctor would like to request a copy of your medical records, please call [phone].      ORTHOPEDIC INJURY   Please know that significant injuries can exist even when an  initial x-ray is read as normal or negative.  This can occur because some fractures (broken bones) are not initially visible on x-rays.  For this reason, close outpatient follow-up with your primary care doctor or bone specialist (orthopedist) is required.    MEDICATIONS AND FOLLOWUP  Please be aware that some prescription medications can cause drowsiness.  Use caution when driving or operating machinery.    The examination and treatment you have received in our Emergency Department is provided on an emergency basis, and is not intended to be a substitute for your primary care physician.  It is important that your doctor checks you again and that you report any new or remaining problems at that time.      24 HOUR PHARMACIES  The nearest 24 hour pharmacy is:    [facility]  [address]  [place], [place] [address]  [phone]      ASSISTANCE WITH INSURANCE    Affordable Care Act  [facility])  Call to start or finish an application, compare plans, enroll or ask a question.  [phone]  TTY: (770)777-1211  Web:  Healthcare.gov    Help Enrolling in [facility]  Cover [place]  (305)745-1217 (TOLL-FREE)  (603)257-0416 (TTY)  Web:  Http://www.coverva.org    Local Help Enrolling in the [facility]  [place] [facility]  [phone] (MAIN)  Email:  health-help@nvfs .org  Web:  BlackjackMyths.is  Address:  [address] [place], [place] [address]    SEDATING MEDICATIONS  Sedating medications include strong pain medications (e.g. narcotics), muscle relaxers, benzodiazepines (used for anxiety and as muscle relaxers), Benadryl/diphenhydramine and other antihistamines for allergic reactions/itching, and other medications.  If you are unsure if you have received a sedating medication, please ask your physician or nurse.  If you received a sedating medication: DO NOT drive a car. DO NOT operate machinery. DO NOT perform jobs where you need to be alert.  DO NOT drink alcoholic beverages while  taking this medicine.     If you get dizzy, sit or lie down at the first signs. Be careful going up and down stairs.  Be extra careful to prevent falls.     Never give this medicine to others.     Keep this medicine out of reach of children.     Do not take or save old medicines. Throw them away when outdated.     Keep all medicines in a cool, dry place. DO NOT keep them in your bathroom medicine cabinet or in a cabinet above the stove.    MEDICATION REFILLS  Please be aware that we cannot refill any prescriptions through the ER. If you need further treatment from what is provided at your ER visit, please follow up with your primary care doctor or your pain management specialist.    FREESTANDING EMERGENCY DEPARTMENTS OF [facility]  Did you know [facility] has two freestanding ERs located just a few miles away?   ER of [facility] [place] have short wait times, easy free parking directly in front of the building and top patient satisfaction scores - and the same Board Certified Emergency Medicine doctors as [facility].

## 2018-02-12 NOTE — ED Notes (Signed)
Pt seen here earlier this morning c/o generalized electrical impulses ongoing x 4 years, bilateral shoulder pain and urinary incontinence. Pt brought back by [facility] [place] pt is voluntary, requesting to be evaluated for generalized electrical impulses, shoulder pain and psych eval.   Sitter at bedside.

## 2018-02-12 NOTE — EDIE (Signed)
COLLECTIVE?NOTIFICATION?02/12/2018 09:42?Danielle Brown, Danielle Brown?MRN: [id number]    Criteria Met      3 Different Facilities in 90 Days    5 ED Visits in 12 Months    Security and Safety  No recent Security Events currently on file    ED Care Guidelines  There are currently no ED Care Guidelines for this patient. Please check your facility's medical records system.      Prescription Monitoring Program  PDMP query found no report.  Narx Score not available at this time.      [name]. Visit Count (12 mo.)  Facility Visits   [place] 1   [place] [facility] 1   [place]: [facility]) 1   [facility] 3   Total 6   Note: Visits indicate total known visits.      Recent Emergency Department Visit Summary  Date Facility [facility] Type Diagnoses or Chief Complaint   Feb 12, 2018 [place] [facility] [place] [place] [facility]      triage-      triage-electrical impulses/shoulder pain      Generalized electrical impulses      Psychiatric Evaluation      Feb 12, 2018 [place] [facility] [place] [place] [facility]      triage      triage Electrical Impulses, incontinence      Encounter for screening, unspecified      Jan 07, 2018 [place] [facility] [place] [facility] [place] [place] [facility]      headache, chest pain      Shoulder Pain      Headache      Other headache syndrome      Chest pain, unspecified      Homelessness      Jan 06, 2018 [place] [facility] [place] [facility] [place] [place] [facility]      medic-Headache Pt #1 of 2      Knee Pain      Headache      Bronchitis, not specified as acute or chronic      Nov 26, 2017 [place] [facility] [place] [place] [facility]      pysch evaluation      Psychiatric Evaluation      Brief psychotic disorder      Jul 20, 2017 [place]  Emergency      dehydration      Hematuria, unspecified      Urinary tract infection, site not specified      Dehydration      Homelessness          Recent Inpatient Visit Summary  No recorded inpatient visits.     Care Providers  There  are no care providers on record at this time.   Collective Portal  This patient has registered at the [facility] Emergency Department   For more information visit: [email or web address]     andnbsp PLEASE NOTE:    1.   Any care recommendations and other clinical information are provided as guidelines or for historical purposes only, and providers should exercise their own clinical judgment when providing care.    2.   You may only use this information for purposes of treatment, payment or health care operations activities, and subject to the limitations of applicable [employer] Policies.    3.   You should consult directly with the organization that provided a care guideline or other clinical history with any questions about additional information or accuracy  or completeness of information provided.    ? 2019 [employer] - https://craig.com/

## 2018-02-12 NOTE — ED Notes (Signed)
Security at bedside to wand pt and secure belongings.

## 2018-02-12 NOTE — Progress Notes (Signed)
Systems Case Management Progress Note:     Type of Services Provider Name   Provider Phone Number   Length of Need SCM approved by:  Comments:   Skilled Nursing Facility "SNF"        Assisted Living        LTAC        Dialysis        Home Health        Infusion        DME        Medications        Transportation   1X Erick Alley, MSW Patient provided with 4 bus tokens   Non-skilled Care ie Private Duty Aide          Erick Alley, MSW, ACM-SW  Emergency Department Social Worker  Case Management Department  [facility]  [phone]

## 2018-02-12 NOTE — Progress Notes (Signed)
Psychiatric Evaluation Part I    Danielle Brown is a 42 y.o. female admitted to the [facility] Emergency Department who was seen via Telepsych on 02/12/2018 by Hortense Ramal, LCSW.    Call Details  Patient Location: [facility] ED  Patient Room Number: N40  Time contacted by ED Physician: 1055  Time consult began: 1210  Time (in minutes) from Call to Consult: 75  Time consult concluded: 1300  Referring ED Department  Emergency Department: [facility]        Discharge Planning  Living Arrangements: Alone  Type of Residence: Homeless  Patient expects to be discharged to:: Shelter    Presenting Mental Status  Orientation Level: Oriented X4  Memory: No Impairment  Thought Content: normal  Thought Process: normal  Behavior: agitated  Consciousness: Alert  Impulse Control: normal  Perception: normal  Eye Contact: normal  Attitude: guarded  Mood: irritable  Hopelessness Affects Goals: No  Hopelessness About Future: No  Affect: normal  Speech: normal  Concentration: normal  Insight: fair  Judgment: fair  Appearance: unkempt, disheveled  Appetite: normal  Weight change?: normal  Energy: normal  Sleep: normal  Reliability of Reporter/Patient: fair    Tool for Assessment of Suicide Risk  Individual Risk Profile: psychiatric illness, poor social support  Symptom Risk Profile: agitation  Protective Factors: internalized teachings against suicide, abstinent from substances, trait optimism  Level of Suicide Risk: Low    Within the Last 6 Months:: no history of violence toward self  Greater than 6 Months Ago:: no history of violence toward self                            Preliminary Diagnosis #1: F20.9 Schizophrenia            Violence Toward Others  Within the Last 6 Months:: no history of violence toward others  Greater than 6 Months Ago:: no history of violence toward others     Preliminary Diagnosis (DSM IV)  Axis I: F20.9 Schizophrenia   Axis II: Deferred  Axis III: Deferred        Summary:     Patient is a single homeless 42  year old African-American female who presented to the ER earlier this morning complaining of arm pain. She was discharged later and was found walking [address] by [employer]. State Trooper reported she was non-communicative at first with him but they eventually built a rapport. He brought her back to the ER for an evaluation in hopes to get her connected to services.     Patient was calm thought slightly irritated. She denied any A/VH and she did not appear to be engaging in internal stimulate at present. Patient did not endorse or speak of any delusional beliefs. Her UA and ETOH labs were free from any substances. She did endorse being hospitalized for psychiatric care in her past and stated she has been prescribed: Geodon, Seroquel, Ambien, Risperdal and Lunesta. Patient did not feel these medications helped and that it only made her drowsy. Patient reported she had not been on medications for several months and that she only took benadryl at [facility] in 2019.     Patient does not believe she has a mental illness. However, she appeared guarded and reported social isolation from family as "there was a disagreement." She reported going on the Highway she was trying to head to [place] as she did not have supports here.  Patient stated she has distant cousin in [place] but not report a relationship with them.     Patient reported she lost her job in 2013 and her car recently. Patient stated she has been staying in shelters but frustrated with the system as they will only allow her to shower if she urinates on herself.     Patient denied any suicidal or homicidal ideations at present or in her history. Her protective factors are: internal teachings against suicide, future-orientation (get her ID, move to [place]) and abstinence from substances.     Disposition: Shelter with referral to [facility] and [facility]    Justification for disposition: Patient does not report any psychiatric symptoms. She appears  to be able to care of self / navigate community resources to meet her needs. Patient does not endorse or present with any risk factors (ie: she is reporting suicidal ideations.)    If patient is voluntarily admitted to an [facility] inpatient psychiatric unit and decides to leave AMA within the first 8 hours on the unit, is there an identified petitioner?N/A    Name of Petitioner:N/A  Contact Information:N/A    Insurance Pre-authorization information:N/A    Was consent for voluntary admission obtain and scanned into EPIC? N/A    By whom? N/A      Hortense Ramal, LCSW    [facility]  [address]  [place], [place] [address]  [phone]

## 2018-02-12 NOTE — ED Notes (Signed)
Pt c/o electrical impulses throughout her body ongoing x 4 years. Also c/o incontinence for which a disposable underwear and pants were provided, bilateral shoulder pain questioning if they were fractured however, pt was able to move all extremities.  Pt agitated and angry at RN demanding her discharge paper work from 12/2017 be explained to her. Pt refused to have VS taken.  Aslani, MD walked into the room, introduced to pt who immediately started yelling at MD questioning her degree and if she was a real doctor. Pt verbally abusive. Pt declined care and placed her jacket back on.  Security called at bedside. Pt escorted out of ED. Pt left unit ambulatory in NAD.
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
## 2018-02-12 NOTE — EDIE (Signed)
COLLECTIVE?NOTIFICATION?02/12/2018 07:17?Danielle, GHAZARIAN Brown?MRN: [id number]    Criteria Met      3 Different Facilities in 90 Days    5 ED Visits in 12 Months    Security and Safety  No recent Security Events currently on file    ED Care Guidelines  There are currently no ED Care Guidelines for this patient. Please check your facility's medical records system.      Prescription Monitoring Program  PDMP query found no report.  Narx Score not available at this time.      [name]. Visit Count (12 mo.)  Facility Visits   [facility] 1   [place] [facility] 1   [facility]: [facility]) 1   [facility] 2   Total 5   Note: Visits indicate total known visits.      Recent Emergency Department Visit Summary  Date Facility [facility] Type Diagnoses or Chief Complaint   Feb 12, 2018 [place] [place] [facility]      triage      Jan 07, 2018 [place] [facility] [place] [facility] [place] [place] [facility]      headache, chest pain      Shoulder Pain      Headache      Other headache syndrome      Chest pain, unspecified      Homelessness      Jan 06, 2018 [place] [facility] [place] [facility] [place] [facility]      medic-Headache Pt #1 of 2      Knee Pain      Headache      Bronchitis, not specified as acute or chronic      Nov 26, 2017 [place] [facility]: [facility] [place] [place] [facility]      pysch evaluation      Psychiatric Evaluation      Brief psychotic disorder      Jul 20, 2017 [place] [place] [place] [facility]      dehydration      Hematuria, unspecified      Urinary tract infection, site not specified      Dehydration      Homelessness          Recent Inpatient Visit Summary  No recorded inpatient visits.     Care Providers  There are no care providers on record at this time.   Collective Portal  This patient has registered at the [facility] Emergency Department   For more information visit: [email or web address]     [email or web address]  PLEASE NOTE:    1.   Any care recommendations and other clinical information are provided as guidelines or for historical purposes only, and providers should exercise their own clinical judgment when providing care.    2.   You may only use this information for purposes of treatment, payment or health care operations activities, and subject to the limitations of applicable Collective Policies.    3.   You should consult directly with the organization that provided a care guideline or other clinical history with any questions about additional information or accuracy or completeness of information provided.    ? 2019 [place], [employer] - [email or web address]

## 2018-02-12 NOTE — Discharge Instructions (Signed)
Dear Ms. Hanshaw:    Thank you for choosing the [facility] Emergency Department, the premier emergency department in the [place] area.  I hope your visit today was EXCELLENT.    Specific instructions for your visit today:      Arm Pain    You have been seen for arm pain.    Many things can cause arm pain. Most of the causes are not dangerous and will get better on their own. These can be muscle cramps, bruises, strains, pinched nerves, and minor skin infections for example.    You had an exam by your doctor today. He or she decided that the cause of your arm pain does not seem to be serious or dangerous.    If your pain continues, you might need another exam or more tests. This is to find out why you have arm pain. The cause of your symptoms doesn't seem dangerous now. You don't need to stay in the hospital.    Though we don't believe your condition is dangerous right now, it is important to be careful. Sometimes a problem that seems mild can become serious later. This is why it is very important that you return here or go to the nearest Emergency Department if you are not improving or your symptoms are getting worse.    Some things you may try at home are:    Over-the-counter pain medications that have ibuprofen (Advil/Motrin) or acetaminophen (Tylenol) in them. Follow the directions on the package.   Rest the arm as needed. As soon as you are able, start moving your arm again to keep it from getting stiff.    Return here or go to the nearest Emergency Department or follow-up with your doctor if you are not getting better as expected.    Follow the instructions for any medication you are prescribed.     YOU SHOULD SEEK MEDICAL ATTENTION IMMEDIATELY, EITHER HERE OR AT THE NEAREST EMERGENCY DEPARTMENT, IF ANY OF THE FOLLOWING HAPPENS:     You have a fever (temperature higher than 100.55F or 38C).   Your pain does not go away or gets worse.   Your arm or joints (elbow, wrist, shoulder, etc.)  get red or swollen.   Your chest hurts or you get short of breath.   You have any other symptoms or concerns, or don't get better as expected.    If you can't follow up with your doctor, or if at any time you feel you need to be rechecked or seen again, come back here or go to the nearest emergency department.               Importance of Primary Care Doctor (Edu)    You have been given instructions to follow up with a primary care doctor.    A primary care doctor helps you maintain your health. For example, you will see him or her for yearly health exams. These can help to see how healthy you are. You will also see him or her for regular check-ups to help identify any health problems.    Your primary care doctor gives you general advice about your health. In addition to treating any medical problems you might have, this doctor tracks your health over time. Your primary doctor can help you to recognize symptoms or changes in your body that could signal new illness. Primary care doctors look at the big picture. This includes your lifestyle and family history. They can help plan the  best ways for you to stay healthy and lead a long, active life. They are also there to refer you to specialists. These can be doctors who are experts in certain disease conditions like diabetes, heart disease, etc.    There are many types of doctors who provide primary care. They all offer the benefits of a lasting, personal relationship. This relationship is based on mutual trust and a thorough knowledge of an individual person. They also provide a wide range of health care services.   Family medicine doctors provide overall care for all family members, from newborns to older adults.   Internal medicine doctors specialize in meeting the complete health care needs of adults, from teenagers through seniors. They provide primary and advanced levels of care.   Obstetrician/gynecologists are often primary doctors for women.  Obstetricians specialize in the health of mothers and their babies. Gynecologists specialize in the health of the female reproductive system. They both can provide regular physicals and health screenings.   Pediatricians are experts in primary care for children. They often see their patients from when they are babies until they are teens.    Primary care doctors may be either MDs or DOs. Today, there are not many differences between an MD (Medical Doctor) and a DO (Doctor of Osteopathic Medicine).     MDs and DOs both go to medical school. They also complete residencies in different medical specialties.      You might not have a primary care doctor. You will need to do your homework and be determined. There are several options available for choosing the best doctor for your needs. There are referral lines in your local area and also specialists that work with your specific health care plan.    Many people find a doctor by asking their friends, neighbors or relatives. There are also referral lines in your local area. You can also use hospital doctor referral services. Your health care plan may also offer referral services. Most health plans also offer the "Ask A Nurse" service. Referral services can tell you about different doctors' backgrounds. They can also tell you about their educational and practice history and age range. You can also find out the office locations and hours and what types of insurance coverage they accept.      When you have decided which doctor may be right for you, make an appointment to ask questions about issues that are important to you. Frequently asked questions include:   Is the doctor on staff at a hospital? Which hospital?   What is the doctor's education?   Does the doctor specialize in certain areas of medicine?   How old is his or her practice?   Does the doctor practice alone or in a group practice?   Is his or her office easy to reach?   What hours are open for  appointments?   What types of insurance does the doctor accept?   Does the doctor accept Medicare or Medicaid?   How far in advance do you have to make an appointment? Are same-day appointments available?   What does the doctor do when you need to see a doctor right away?   What are the doctor's fees? When is payment due? How can payments be made?    Most doctors start a medical chart when they see a patient for the first time in a non-emergency situation. This chart has information about your health history. This record should show your current state of health.  It should also show personal statistics (age, height, weight, job, smoker/ non-smoker), and your family history.    It is VERY important to establish a good relationship with your family doctor! A PCP (Primary Care Physician) is the cornerstone of your care. He or she is the first person you should call with any health concerns or problems. It is VERY important to be an established patient so you can see the doctor quickly if you are sick or hurt. Plan ahead. Make an appointment with the doctor you've chosen so you can become his or her established patient.            If you do not continue to improve or your condition worsens, please contact your doctor or return immediately to the Emergency Department.    Sincerely,  Aslani-Amoli, Toma Deiters, MD  Attending Emergency Physician  [facility] Emergency Department    ONSITE PHARMACY  Our full service onsite pharmacy is located in the ER waiting room.  Open 7 days a week from 9 am to 9 pm.  We accept all major insurances and prices are competitive with major retailers.  Ask your provider to print your prescriptions down to the pharmacy to speed you on your way home.    OBTAINING A PRIMARY CARE APPOINTMENT    Primary care physicians (PCPs, also known as primary care doctors) are either internists or family medicine doctors. Both types of PCPs focus on health promotion, disease prevention, patient  education and counseling, and treatment of acute and chronic medical conditions.    Call for an appointment with a primary care doctor.  Ask to see who is taking new patients.     [facility]  telephone:  [phone]  [email or web address]    DOCTOR REFERRALS  Call [phone] (available 24 hours a day, 7 days a week) if you need any further referrals and we can help you find a primary care doctor or specialist.  Also, available online at:  [email or web address]    YOUR CONTACT INFORMATION  Before leaving please check with registration to make sure we have an up-to-date contact number.  You can call registration at [phone] to update your information.  For questions about your hospital bill, please call [phone].  For questions about your Emergency Dept Physician bill please call [phone].      FREE HEALTH SERVICES  If you need help with health or social services, please call 2-1-1 for a free referral to resources in your area.  2-1-1 is a free service connecting people with information on health insurance, free clinics, pregnancy, mental health, dental care, food assistance, housing, and substance abuse counseling.  Also, available online at:  http://www.[place].org    MEDICAL RECORDS AND TESTS  Certain laboratory test results do not come back the same day, for example urine cultures.   We will contact you if other important findings are noted.  Radiology films are often reviewed again to ensure accuracy.  If there is any discrepancy, we will notify you.      Please call [phone] to pick up a complimentary CD of any radiology studies performed.  If you or your doctor would like to request a copy of your medical records, please call [phone].      ORTHOPEDIC INJURY   Please know that significant injuries can exist even when an initial x-ray is read as normal or negative.  This can occur because some fractures (broken bones) are not initially visible on x-rays.   For this reason,  close outpatient follow-up with your primary care doctor or bone specialist (orthopedist) is required.    MEDICATIONS AND FOLLOWUP  Please be aware that some prescription medications can cause drowsiness.  Use caution when driving or operating machinery.    The examination and treatment you have received in our Emergency Department is provided on an emergency basis, and is not intended to be a substitute for your primary care physician.  It is important that your doctor checks you again and that you report any new or remaining problems at that time.      24 HOUR PHARMACIES  The nearest 24 hour pharmacy is:    [facility]  [address]  [place], [place] [address]  [phone]      ASSISTANCE WITH INSURANCE    Affordable Care Act  [facility])  Call to start or finish an application, compare plans, enroll or ask a question.  [phone]  TTY: [phone]  Web:  Healthcare.gov    Help Enrolling in [facility]  [employer] [place]  [phone] (TOLL-FREE)  3125418977 (TTY)  Web:  Http://www.coverva.org    Local Help Enrolling in the [facility]  [place] [facility]  [phone] (MAIN)  Email:  health-help@nvfs .org  Web:  BlackjackMyths.is  Address:  [address], [address] [place], [place] [address]    SEDATING MEDICATIONS  Sedating medications include strong pain medications (e.g. narcotics), muscle relaxers, benzodiazepines (used for anxiety and as muscle relaxers), Benadryl/diphenhydramine and other antihistamines for allergic reactions/itching, and other medications.  If you are unsure if you have received a sedating medication, please ask your physician or nurse.  If you received a sedating medication: DO NOT drive a car. DO NOT operate machinery. DO NOT perform jobs where you need to be alert.  DO NOT drink alcoholic beverages while taking this medicine.     If you get dizzy, sit or lie down at the first signs. Be careful going up and down stairs.  Be extra careful  to prevent falls.     Never give this medicine to others.     Keep this medicine out of reach of children.     Do not take or save old medicines. Throw them away when outdated.     Keep all medicines in a cool, dry place. DO NOT keep them in your bathroom medicine cabinet or in a cabinet above the stove.    MEDICATION REFILLS  Please be aware that we cannot refill any prescriptions through the ER. If you need further treatment from what is provided at your ER visit, please follow up with your primary care doctor or your pain management specialist.    FREESTANDING EMERGENCY DEPARTMENTS OF [facility]  Did you know [facility] has two freestanding ERs located just a few miles away?  [facility] [place] and [facility] [place] have short wait times, easy free parking directly in front of the building and top patient satisfaction scores - and the same Board Certified Emergency Medicine doctors as [facility].

## 2018-02-12 NOTE — ED Provider Notes (Signed)
I did not participate in the care of this patient.      Lendell Caprice, MD  02/12/18 [phone]

## 2018-02-12 NOTE — ED Notes (Signed)
Pt's belongings requested from security. Pt A/ox4, discharge instructions provided, pt verbalized understanding.

## 2018-02-12 NOTE — ED Provider Notes (Shared)
[facility] EMERGENCY DEPARTMENT H&P      Visit date: 02/12/2018      CLINICAL SUMMARY           Diagnosis:    .     Final diagnoses:   Encounter for medical screening examination         MDM Notes:               Disposition:         Discharge         Discharge Prescriptions     None                      CLINICAL INFORMATION        HPI:      Chief Complaint: No chief complaint on file.  Danielle Brown is a 42 y.o. female with PMHx including depression, psychosis; who presents with diffuse electrical pulse sensation throughout her body while walking down the freeway today.    Hx of same sensation for the past few years.  Also c/o of urinating herself, and feeling electrical pulses in groin area.  Seen at [facility]  On 2/16, normal labs, discharged home.     History obtained from: patient, review of prior chart          ROS:      Positive and negative ROS elements as per HPI.  All other systems reviewed and negative.      Physical Exam:      Pulse 60  BP    Resp    SpO2 100 %  Temp      Physical Exam   Constitutional: She is oriented to person, place, and time. She appears well-developed and well-nourished.   HENT:   Head: Normocephalic and atraumatic.   Eyes: Pupils are equal, round, and reactive to light. Conjunctivae and EOM are normal.   Neck: Normal range of motion. Neck supple.   Cardiovascular: Normal rate, regular rhythm and intact distal pulses.    Pulmonary/Chest: Effort normal and breath sounds normal.   Abdominal: Soft. Bowel sounds are normal.   Musculoskeletal: Normal range of motion.   Neurological: She is alert and oriented to person, place, and time.   Skin: Skin is warm and dry.   Psychiatric: She has a normal mood and affect.   Vitals reviewed.                 PAST HISTORY        Primary Care Provider: Christa See, MD        PMH/PSH:    .     Past Medical History:   Diagnosis Date   . Depression    . Psychosis 2013       She has no past surgical history on  file.      Social/Family History:      She reports that she has never smoked. She has never used smokeless tobacco. She reports that she does not drink alcohol or use drugs.    Family History   Problem Relation Age of Onset   . Early death Mother    . Alcohol abuse Father    . Alcohol abuse Paternal Grandfather          Listed Medications on Arrival:    .     Home Medications     None on File         Allergies: She is allergic to penicillins.  VISIT INFORMATION        Clinical Course in the ED:                 Medications Given in the ED:    .     ED Medication Orders     None            Procedures:      Procedures      Interpretations:                    RESULTS        Lab Results:      Results     ** No results found for the last 24 hours. **              Radiology Results:      No orders to display               Scribe Attestation:      I was acting as a scribe for Allayne Butcher, MD on NWGNFAO,ZHYQMV D  Treatment Team: Scribe: Orinda Kenner     I am the first provider for this patient and I personally performed the services documented. Treatment Team: Scribe: Orinda Kenner is scribing for me on Lebeda,Aldena D. This note and the patient instructions accurately reflect work and decisions made by me.  Allayne Butcher, MD

## 2018-02-19 ENCOUNTER — Emergency Department
Admission: EM | Admit: 2018-02-19 | Discharge: 2018-02-19 | Disposition: A | Discharge status: Home / Self Care | Payer: Charity | Attending: Emergency Medicine | Admitting: Emergency Medicine

## 2018-02-19 DIAGNOSIS — R202 Paresthesia of skin: Secondary | ICD-10-CM | POA: Insufficient documentation

## 2018-02-19 LAB — CBC AND DIFFERENTIAL
Absolute NRBC: 0 10*3/uL (ref 0.00–0.00)
Basophils Absolute Automated: 0.03 10*3/uL (ref 0.00–0.08)
Basophils Automated: 0.4 %
Eosinophils Absolute Automated: 0.03 10*3/uL (ref 0.00–0.44)
Eosinophils Automated: 0.4 %
Hematocrit: 37.8 % (ref 34.7–43.7)
Hgb: 11.6 g/dL (ref 11.4–14.8)
Immature Granulocytes Absolute: 0.02 10*3/uL (ref 0.00–0.07)
Immature Granulocytes: 0.2 %
Lymphocytes Absolute Automated: 1 10*3/uL (ref 0.42–3.22)
Lymphocytes Automated: 12.2 %
MCH: 25.9 pg (ref 25.1–33.5)
MCHC: 30.7 g/dL — ABNORMAL LOW (ref 31.5–35.8)
MCV: 84.4 fL (ref 78.0–96.0)
MPV: 10.8 fL (ref 8.9–12.5)
Monocytes Absolute Automated: 0.62 10*3/uL (ref 0.21–0.85)
Monocytes: 7.6 %
Neutrophils Absolute: 6.47 10*3/uL — ABNORMAL HIGH (ref 1.10–6.33)
Neutrophils: 79.2 %
Nucleated RBC: 0 /100 WBC (ref 0.0–0.0)
Platelets: 279 10*3/uL (ref 142–346)
RBC: 4.48 10*6/uL (ref 3.90–5.10)
RDW: 15 % (ref 11–15)
WBC: 8.17 10*3/uL (ref 3.10–9.50)

## 2018-02-19 LAB — COMPREHENSIVE METABOLIC PANEL
ALT: 18 U/L (ref 0–55)
AST (SGOT): 20 U/L (ref 5–34)
Albumin/Globulin Ratio: 1.1 (ref 0.9–2.2)
Albumin: 4 g/dL (ref 3.5–5.0)
Alkaline Phosphatase: 70 U/L (ref 37–106)
Anion Gap: 8 (ref 5.0–15.0)
BUN: 21 mg/dL — ABNORMAL HIGH (ref 7.0–19.0)
Bilirubin, Total: 0.3 mg/dL (ref 0.2–1.2)
CO2: 25 mEq/L (ref 22–29)
Calcium: 9.8 mg/dL (ref 8.5–10.5)
Chloride: 103 mEq/L (ref 100–111)
Creatinine: 0.9 mg/dL (ref 0.6–1.0)
Globulin: 3.6 g/dL (ref 2.0–3.6)
Glucose: 74 mg/dL (ref 70–100)
Potassium: 4.4 mEq/L (ref 3.5–5.1)
Protein, Total: 7.6 g/dL (ref 6.0–8.3)
Sodium: 136 mEq/L (ref 136–145)

## 2018-02-19 LAB — HEMOLYSIS INDEX: Hemolysis Index: 18 (ref 0–18)

## 2018-02-19 LAB — GFR: EGFR: >60

## 2018-02-19 NOTE — Discharge Instructions (Signed)
Paresthesias    You have been seen for paresthesias.    Paresthesia is an abnormal sensation (feeling) in any part of the body. The paresthesia itself has no long-term bad effects. People often describe it as tingling, numbness, burning, or pricking of the skin. Many say it feels like "pins and needles," or like the body part is asleep.    Paresthesias can be a symptom of some illnesses. This means there are many things that can cause paresthesias. The paresthesias can be a sign of an underlying medical condition.     Some causes of paresthesias are:   Skin Problems: Irritation of skin by certain chemicals. Swelling of the skin from an injury. A burn or frostbite can feel like numbness.   Pressure on a nerve. This can happen when your arm "falls asleep" from lying on it too long. Carpal tunnel syndrome can do the same thing.   Hyperventilation (rapid or deep breathing).   Deficiency in some vitamins and minerals. This includes vitamins B1, B5, and B12.   Electrolyte problems.   Diabetic neuropathy (nerve disorders) from long-standing diabetes.   Problems with circulation.   Strokes.    You may have had some testing to help find out the cause of your paresthesias.    We still do not know the cause of your paresthesias. However, it is OK for you to go home. You may need more tests to figure out the cause.    See your primary care doctor or the referral specialist for more work-up and management of your paresthesias.    YOU SHOULD SEEK MEDICAL ATTENTION IMMEDIATELY, EITHER HERE OR AT THE NEAREST EMERGENCY DEPARTMENT, IF ANY OF THE FOLLOWING OCCUR:   Your arms get weak, numb or paralyzed (can t move), especially on one side.   You have vision loss, trouble speaking or problems thinking.   Your speech is abnormal or one side of your face droops.   You lose consciousness ("pass out") or almost lose consciousness.   You have numbness or tingling after a head, neck or back injury.   You feel very dizzy  or like the room is spinning.   You have other concerns.

## 2018-02-19 NOTE — EDIE (Signed)
COLLECTIVE?NOTIFICATION?02/19/2018 18:13?BERKLEY, WRIGHTSMAN D?MRN: [id number]    Criteria Met      5 ED Visits in 12 Months    3 Different Facilities in 90 Days    Security and Safety  No recent Security Events currently on file    ED Care Guidelines  There are currently no ED Care Guidelines for this patient. Please check your facility's medical records system.      Prescription Monitoring Program  PDMP query found no report.  Narx Score not available at this time.      E.D. Visit Count (12 mo.)  Facility Visits   [place] 1   [place] - [facility] 2   [place] [facility]) 1   [facility] 3   Total 7   Note: Visits indicate total known visits.      Recent Emergency Department Visit Summary  Date Facility [facility] Type Diagnoses or Chief Complaint   Feb 19, 2018 [place] [facility] [place] [facility] [place] [facility]      electrical shocks      Feb 12, 2018 [place] [facility] [place] [facility] [place] [place] [facility]      triage-      triage-electrical impulses/shoulder pain      Generalized electrical impulses      Psychiatric Evaluation      Pain in arm, unspecified      Encounter for other general examination      Feb 12, 2018 [place] [facility] [place] [facility] [place] [place] [facility]      triage      triage Electrical Impulses, incontinence      Encounter for screening, unspecified      Jan 07, 2018 [place] [facility] [place] [facility] [place] [facility]      headache, chest pain      Shoulder Pain      Headache      Other headache syndrome      Chest pain, unspecified      Homelessness      Jan 06, 2018 [place] [facility] [place] [facility] [place] [place] [facility]      medic-Headache Pt #1 of 2      Knee Pain      Headache      Bronchitis, not specified as acute or chronic      Nov 26, 2017 [place] [facility] [place].  Emergency      pysch evaluation      Psychiatric Evaluation      Brief psychotic disorder      Jul 20, 2017 [place] [place] [facility].  Emergency      dehydration      Hematuria, unspecified      Urinary tract  infection, site not specified      Dehydration      Homelessness          Recent Inpatient Visit Summary  No recorded inpatient visits.     Care Providers  There are no care providers on record at this time.   Collective Portal  This patient has registered at the [facility] Emergency Department   For more information visit: [email or web address]     andnbsp PLEASE NOTE:    1.   Any care recommendations and other clinical information are provided as guidelines or for historical purposes only, and providers should exercise their own clinical judgment when providing care.    2.   You may only use this information for purposes of treatment, payment or health care  operations activities, and subject to the limitations of applicable [employer].    3.   You should consult directly with the organization that provided a care guideline or other clinical history with any questions about additional information or accuracy or completeness of information provided.    ? 2019 [employer] - [email or web address]

## 2018-02-19 NOTE — ED Triage Notes (Signed)
Danielle Brown is a 42 y.o. female [facility] for c/o sensing "electric impulses" all over her body x 3 years. Pt. Was seen previous for this similar complain and requested to be seen again today. Per EMS, BP 120/74, HR 76, 100% RA.

## 2018-02-19 NOTE — ED Provider Notes (Signed)
EMERGENCY DEPARTMENT HISTORY AND PHYSICAL EXAM     Physician/Midlevel provider first contact with patient: 02/19/18 1847         Date: 02/19/2018  Patient Name: Danielle Brown    History of Presenting Illness     Chief Complaint   Patient presents with   . Tingling       History Provided By: Patient    Chief Complaint: electrical shock  Duration: several weeks  Timing:  intermittent  Location: generalized  Severity: moderate  Exacerbating factors: none   Alleviating factors: none  Associated Symptoms: tingling  Pertinent Negatives: fever    Additional History: Danielle Brown is a 42 y.o. female, with PMHx of depression and psychosis, presenting to the ED with c/o several weeks of intermittent, generalized "electrical shocks", moderate in severity. She states she is trying to find out the cause of these "shocks". Pt notes associated tingling over the whole body but no fever.       PCP: Pcp, Noneorunknown, MD  SPECIALISTS:    No current facility-administered medications for this encounter.      No current outpatient prescriptions on file.       Past History     Past Medical History:  Past Medical History:   Diagnosis Date   . Depression    . Psychosis 2013       Past Surgical History:  History reviewed. No pertinent surgical history.    Family History:  Family History   Problem Relation Age of Onset   . Early death Mother    . Alcohol abuse Father    . Alcohol abuse Paternal Grandfather        Social History:  Social History   Substance Use Topics   . Smoking status: Never Smoker   . Smokeless tobacco: Never Used      Comment: No tobacco use in the past 30 days   . Alcohol use No       Allergies:  Allergies   Allergen Reactions   . Penicillins        Review of Systems     Constitutional: Negative for fever or chills.   Neurological: Positive for tingling. Negative for speech changes, weakness, or numbness.  Eyes: Negative for visual changes or eye pain.  HENT: No headache. Negative for sore throat, neck pain, or  runny nose.   Cardiovascular: Negative for chest pain.   Respiratory: Negative for shortness of breath.   Gastrointestinal: Negative for abdominal pain, nausea, vomiting, diarrhea, or blood in stool.   Genitourinary: Negative for dysuria or hematuria.  Musculoskeletal: Negative for gait changes, joint pain or muscle pain.   Skin: Negative for itching or rash.   Hematological: Negative for easy bruising      Physical Exam   BP 120/61   Pulse 82   Temp 98.3 F (36.8 C) (Oral)   Resp 16   SpO2 100%     Physical Exam   Constitutional: Disheveled appearance. In no acute distress. Oriented to person, place, and time.  Head: Normocephalic and atraumatic.   Mouth/Throat: Oropharynx is clear and moist.   Eyes: Conjunctivae normal and EOM are normal. Pupils are equal, round, and reactive to light.    Neck: Normal range of motion. Neck supple.   Cardiovascular: Normal rate, regular rhythm, normal heart sounds and intact distal pulses.  No murmur heard.  Pulmonary/Chest: Effort normal and breath sounds normal.   Abdominal: Soft. Non distended. Non tender. No rebound or guarding  Musculoskeletal:  No peripheral edema. No calf swelling or tenderness.    Neurological: Patient is alert and oriented to person, place, and time. No cranial nerve deficit.  GCS score is 15.   Skin: Skin is warm and dry. No rash  Psychiatric: Affect normal.         Diagnostic Study Results     Labs -     Results     Procedure Component Value Units Date/Time    Comprehensive metabolic panel [[id number]]  (Abnormal) Collected:  02/19/18 1901    Specimen:  Blood Updated:  02/19/18 1939     Glucose 74 mg/dL      BUN 40.9 (H) mg/dL      Creatinine 0.9 mg/dL      Sodium 811 mEq/L      Potassium 4.4 mEq/L      Chloride 103 mEq/L      CO2 25 mEq/L      Calcium 9.8 mg/dL      Protein, Total 7.6 g/dL      Albumin 4.0 g/dL      AST (SGOT) 20 U/L      ALT 18 U/L      Alkaline Phosphatase 70 U/L      Bilirubin, Total 0.3 mg/dL      Globulin 3.6 g/dL       Albumin/Globulin Ratio 1.1     Anion Gap 8.0    Hemolysis index [[id number]] Collected:  02/19/18 1901     Updated:  02/19/18 1939     Hemolysis Index 18    GFR [[id number]] Collected:  02/19/18 1901     Updated:  02/19/18 1939     EGFR >60.0    CBC with differential [[id number]]  (Abnormal) Collected:  02/19/18 1901    Specimen:  Blood from Blood Updated:  02/19/18 1921     WBC 8.17 x10 3/uL      Hgb 11.6 g/dL      Hematocrit 41.3 %      Platelets 279 x10 3/uL      RBC 4.48 x10 6/uL      MCV 84.4 fL      MCH 25.9 pg      MCHC 30.7 (L) g/dL      RDW 15 %      MPV 10.8 fL      Neutrophils 79.2 %      Lymphocytes Automated 12.2 %      Monocytes 7.6 %      Eosinophils Automated 0.4 %      Basophils Automated 0.4 %      Immature Granulocyte 0.2 %      Nucleated RBC 0.0 /100 WBC      Neutrophils Absolute 6.47 (H) x10 3/uL      Abs Lymph Automated 1.00 x10 3/uL      Abs Mono Automated 0.62 x10 3/uL      Abs Eos Automated 0.03 x10 3/uL      Absolute Baso Automated 0.03 x10 3/uL      Absolute Immature Granulocyte 0.02 x10 3/uL      Absolute NRBC 0.00 x10 3/uL           Radiologic Studies -   Radiology Results (24 Hour)     ** No results found for the last 24 hours. **      .    Medical Decision Making   I am the first provider for this patient.    I reviewed the vital signs, available nursing notes, past  medical history, past surgical history, family history and social history.    Vital Signs-Reviewed the patient's vital signs.     Patient Vitals for the past 12 hrs:   BP Temp Pulse Resp   02/19/18 1826 120/61 98.3 F (36.8 C) 82 16       Pulse Oximetry Analysis - Normal 100% on RA    EKG:  Interpreted by the EP.   Time Interpreted: 1858   Rate: 74   Rhythm: Sinus Rhythm    Interpretation: Sinus Rhythm. Normal PR. Normal QRS. No ectopy. No significant ST changes. No STEMI.   Comparison: Compared to EKG from 02/12/2018, previously bradycardic         Old Medical Records: Nursing notes.     ED Course:    8:40 PM - Pt resting in  NAD, VSS. Discussed blood work and previous normal CT results with pt and counseled on diagnosis, f/u plans with TCC, medication use, and signs and symptoms when to return to ED.  Pt is stable and ready for discharge.               Diagnosis     Clinical Impression:   1. Tingling        Treatment Plan:   ED Disposition     ED Disposition Condition Date/Time Comment    Discharge  Sun Feb 19, 2018  8:42 PM Richardean Sale Manfredi discharge to home/self care.    Condition at disposition: Stable            _______________________________      Attestations: This note is prepared by Lerry Paterson, acting as scribe for Audley Hose, MD.    Audley Hose, MD - The scribe's documentation has been prepared under my direction and personally reviewed by me in its entirety.  I confirm that the note above accurately reflects all work, treatment, procedures, and medical decision making performed by me.    _______________________________     Cherlyn Roberts, MD  02/23/18 2255

## 2018-02-21 LAB — ECG 12-LEAD
Atrial Rate: 74 {beats}/min
P Axis: 79 degrees
P-R Interval: 128 ms
Q-T Interval: 350 ms
QRS Duration: 76 ms
QTC Calculation (Bezet): 388 ms
R Axis: 67 degrees
T Axis: 39 degrees
Ventricular Rate: 74 {beats}/min

## 2018-02-28 ENCOUNTER — Emergency Department
Admission: EM | Admit: 2018-02-28 | Discharge: 2018-02-28 | Disposition: A | Discharge status: Home / Self Care | Payer: Charity | Attending: Emergency Medicine | Admitting: Emergency Medicine

## 2018-02-28 DIAGNOSIS — R202 Paresthesia of skin: Secondary | ICD-10-CM | POA: Insufficient documentation

## 2018-02-28 MED ORDER — NAPROXEN 250 MG PO TABS
500.00 mg | ORAL_TABLET | Freq: Once | ORAL | Status: AC
Start: 2018-02-28 — End: 2018-02-28
  Administered 2018-02-28: 03:00:00 500 mg via ORAL
  Filled 2018-02-28: qty 2

## 2018-02-28 MED ORDER — NAPROXEN 500 MG PO TABS
500.00 mg | ORAL_TABLET | Freq: Two times a day (BID) | ORAL | 0 refills | Status: DC
Start: 2018-02-28 — End: 2018-05-01

## 2018-02-28 NOTE — EDIE (Signed)
COLLECTIVE?NOTIFICATION?02/28/2018 02:05?JAHARA, DAIL D?MRN: [id number]    Criteria Met      5 ED Visits in 12 Months    Security and Safety  No recent Security Events currently on file    ED Care Guidelines  There are currently no ED Care Guidelines for this patient. Please check your facility's medical records system.      Prescription Monitoring Program  PDMP query found no report.  Narx Score not available at this time.      E.D. Visit Count (12 mo.)  Facility Visits   [place] 1   [place] - [facility] 3   [place] [facility]: [facility] [place] 1   [facility] 3   Total 8   Note: Visits indicate total known visits.      Recent Emergency Department Visit Summary  Date Facility [facility] Type Diagnoses or Chief Complaint   Feb 28, 2018 [place] [facility] [place] [facility]      L arm and shouler pain x a few months; cough x a few days      Feb 19, 2018 [place] [facility] [place] [place] [facility]      electrical shocks      Tingling      Paresthesia of skin      Feb 12, 2018 [place] [facility] [place] [facility]      triage-      triage-electrical impulses/shoulder pain      Generalized electrical impulses      Psychiatric Evaluation      Pain in arm, unspecified      Encounter for other general examination      Feb 12, 2018 [place] [facility] [facility]      triage      triage Electrical Impulses, incontinence      Encounter for screening, unspecified      Jan 07, 2018 [place] [facility] [place] [place] [facility]      headache, chest pain      Shoulder Pain      Headache      Other headache syndrome      Chest pain, unspecified      Homelessness      Jan 06, 2018 [place] [facility] [facility]      medic-Headache Pt #1 of 2      Knee Pain      Headache      Bronchitis, not specified as acute or chronic      Nov 26, 2017  Emergency Room: [facility]) [place]. [place] [facility]      pysch evaluation      Psychiatric Evaluation      Brief psychotic disorder       Jul 20, 2017 [place] [place] [facility]      dehydration      Hematuria, unspecified      Urinary tract infection, site not specified      Dehydration      Homelessness          Recent Inpatient Visit Summary  No recorded inpatient visits.     Care Providers  There are no care providers on record at this time.   Collective Portal  This patient has registered at the [facility] Emergency Department   For more information visit: [email or web address]     andnbsp PLEASE NOTE:    1.   Any care recommendations and other clinical information are  provided as guidelines or for historical purposes only, and providers should exercise their own clinical judgment when providing care.    2.   You may only use this information for purposes of treatment, payment or health care operations activities, and subject to the limitations of applicable Collective Policies.    3.   You should consult directly with the organization that provided a care guideline or other clinical history with any questions about additional information or accuracy or completeness of information provided.    ? 2019 [employer] - [email or web address]/

## 2018-02-28 NOTE — ED Notes (Signed)
Pt given socks and ortho shoes per MD request.

## 2018-02-28 NOTE — ED Provider Notes (Signed)
Physician/Midlevel provider first contact with patient: 02/28/18 0222         Pt presents due to ongoing pain and tingling in her left arm.  This has been going on intermittently for a couple of months.  Pt states that when she wakes up, she feels it the most.  Pt not taking any meds for this regularly.  No neck pain.    Pt states that she also has been on her feet a lot recently.  Pt states that she has had to walk around in wet shoes.  Hx from the pt.      Review of Systems   Constitutional: Negative.    Musculoskeletal: Positive for joint pain. Negative for falls.   Neurological: Positive for tingling. Negative for headaches.   All other systems reviewed and are negative.    Physical Exam   Constitutional: She is oriented to person, place, and time. She appears well-developed and well-nourished.   HENT:   Head: Normocephalic and atraumatic.   Neck: Normal range of motion. Neck supple.   Cardiovascular: Normal rate and regular rhythm.    Pulmonary/Chest: Effort normal and breath sounds normal.   Abdominal: Soft. She exhibits no distension.   Musculoskeletal: Normal range of motion. She exhibits no edema.   No edema of upper or lower extremities   Neurological: She is alert and oriented to person, place, and time. No cranial nerve deficit. Coordination normal.   Nursing note and vitals reviewed.    Oxygen saturations are 100%      5:56 AM  Discussed tx plan with the pt.  Likely peripheral neuropathy.  Will rx nsaids.  Will have pt f/u in clinic.  Will get dry shoes for the pt to wear.    Impression:  1. Paresthesia           Jethro Bastos, MD  02/28/18 [phone]

## 2018-02-28 NOTE — ED Triage Notes (Signed)
Pain L arm and shoulder z a few weeks with numbness and tingling. Also reports pain L side head, onset just PTA.  Pt is homeless.

## 2018-02-28 NOTE — Discharge Instructions (Signed)
Paresthesias    You have been seen for paresthesias.    Paresthesia is an abnormal sensation (feeling) in any part of the body. The paresthesia itself has no long-term bad effects. People often describe it as tingling, numbness, burning, or pricking of the skin. Many say it feels like "pins and needles," or like the body part is asleep.    Paresthesias can be a symptom of some illnesses. This means there are many things that can cause paresthesias. The paresthesias can be a sign of an underlying medical condition.     Some causes of paresthesias are:   Skin Problems: Irritation of skin by certain chemicals. Swelling of the skin from an injury. A burn or frostbite can feel like numbness.   Pressure on a nerve. This can happen when your arm "falls asleep" from lying on it too long. Carpal tunnel syndrome can do the same thing.   Hyperventilation (rapid or deep breathing).   Deficiency in some vitamins and minerals. This includes vitamins B1, B5, and B12.   Electrolyte problems.   Diabetic neuropathy (nerve disorders) from long-standing diabetes.   Problems with circulation.   Strokes.    You may have had some testing to help find out the cause of your paresthesias.    We still do not know the cause of your paresthesias. However, it is OK for you to go home. You may need more tests to figure out the cause.    See your primary care doctor or the referral specialist for more work-up and management of your paresthesias.    YOU SHOULD SEEK MEDICAL ATTENTION IMMEDIATELY, EITHER HERE OR AT THE NEAREST EMERGENCY DEPARTMENT, IF ANY OF THE FOLLOWING OCCUR:   Your arms get weak, numb or paralyzed (can t move), especially on one side.   You have vision loss, trouble speaking or problems thinking.   Your speech is abnormal or one side of your face droops.   You lose consciousness ("pass out") or almost lose consciousness.   You have numbness or tingling after a head, neck or back injury.   You feel very dizzy  or like the room is spinning.   You have other concerns.

## 2018-02-28 NOTE — EDIE (Signed)
COLLECTIVE?NOTIFICATION?02/28/2018 02:05?[place], Janae D?MRN: [id number]    Criteria Met      5 ED Visits in 12 Months    Security and Safety  No recent Security Events currently on file    ED Care Guidelines  There are currently no ED Care Guidelines for this patient. Please check your facility's medical records system.      Prescription Monitoring Program  PDMP query found no report.  Narx Score not available at this time.      E.D. Visit Count (12 mo.)  Facility Visits   [place] 1   [place] [facility] 3   [place] [facility]: [place] 1   [place] - [facility] 3   Total 8   Note: Visits indicate total known visits.      Recent Emergency Department Visit Summary  Date Facility [place] Type Diagnoses or Chief Complaint   Feb 28, 2018 [place] [facility] [place] [place] [facility]      L arm and shouler pain x a few months; cough x a few days      Feb 19, 2018 [place] [facility] [place] [place] [facility]      electrical shocks      Tingling      Paresthesia of skin      Feb 12, 2018 [place] [facility] [place] [place] [facility]      triage-      triage-electrical impulses/shoulder pain      Generalized electrical impulses      Psychiatric Evaluation      Pain in arm, unspecified      Encounter for other general examination      Feb 12, 2018 [place] [facility] [place] [place] [facility]      triage      triage Electrical Impulses, incontinence      Encounter for screening, unspecified      Jan 07, 2018 [place] [facility] [place] [place] [facility]      headache, chest pain      Shoulder Pain      Headache      Other headache syndrome      Chest pain, unspecified      Homelessness      Jan 06, 2018 [place]  [facility] [place] [place] [facility]      medic-Headache Pt #1 of 2      Knee Pain      Headache      Bronchitis, not specified as acute or chronic      Nov 26, 2017 [place] [facility]: [place] [facility]      pysch evaluation      Psychiatric Evaluation      Brief psychotic disorder       Jul 20, 2017 [place] [place] [facility]      dehydration      Hematuria, unspecified      Urinary tract infection, site not specified      Dehydration      Homelessness          Recent Inpatient Visit Summary  No recorded inpatient visits.     Care Providers  There are no care providers on record at this time.   Collective Portal  This patient has registered at the [facility] Emergency Department   For more information visit: [email or web address]     andnbsp PLEASE NOTE:    1.   Any care recommendations and other clinical information are   provided as guidelines or for historical purposes only, and providers should exercise their own clinical judgment when providing care.    2.   You may only use this information for purposes of treatment, payment or health care operations activities, and subject to the limitations of applicable Collective Policies.    3.   You should consult directly with the organization that provided a care guideline or other clinical history with any questions about additional information or accuracy or completeness of information provided.    ? 2019 [employer] - www.collectivemedical.com

## 2018-04-24 ENCOUNTER — Emergency Department
Admission: EM | Admit: 2018-04-24 | Discharge: 2018-04-24 | Disposition: A | Discharge status: Home / Self Care | Payer: Self-pay | Attending: Emergency Medicine | Admitting: Emergency Medicine

## 2018-04-24 DIAGNOSIS — R202 Paresthesia of skin: Secondary | ICD-10-CM | POA: Insufficient documentation

## 2018-04-24 DIAGNOSIS — R519 Headache, unspecified: Secondary | ICD-10-CM

## 2018-04-24 DIAGNOSIS — R51 Headache: Secondary | ICD-10-CM | POA: Insufficient documentation

## 2018-04-24 MED ORDER — ACETAMINOPHEN 325 MG PO TABS
650.0000 mg | ORAL_TABLET | Freq: Once | ORAL | Status: AC
Start: 2018-04-24 — End: 2018-04-24
  Administered 2018-04-24: 19:00:00 650 mg via ORAL
  Filled 2018-04-24: qty 2
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
## 2018-04-24 NOTE — Discharge Instructions (Signed)
Paresthesias    You have been seen for paresthesias.    Paresthesia is an abnormal sensation (feeling) in any part of the body. The paresthesia itself has no long-term bad effects. People often describe it as tingling, numbness, burning, or pricking of the skin. Many say it feels like "pins and needles," or like the body part is asleep.    Paresthesias can be a symptom of some illnesses. This means there are many things that can cause paresthesias. The paresthesias can be a sign of an underlying medical condition.     Some causes of paresthesias are:   Skin Problems: Irritation of skin by certain chemicals. Swelling of the skin from an injury. A burn or frostbite can feel like numbness.   Pressure on a nerve. This can happen when your arm "falls asleep" from lying on it too long. Carpal tunnel syndrome can do the same thing.   Hyperventilation (rapid or deep breathing).   Deficiency in some vitamins and minerals. This includes vitamins B1, B5, and B12.   Electrolyte problems.   Diabetic neuropathy (nerve disorders) from long-standing diabetes.   Problems with circulation.   Strokes.    You may have had some testing to help find out the cause of your paresthesias.    We still do not know the cause of your paresthesias. However, it is OK for you to go home. You may need more tests to figure out the cause.    See your primary care doctor or the referral specialist for more work-up and management of your paresthesias.    The doctor has given you a prescription to have more tests done to monitor your paresthesias. Take the prescription to the lab on the day of your test.     YOU SHOULD SEEK MEDICAL ATTENTION IMMEDIATELY, EITHER HERE OR AT THE NEAREST EMERGENCY DEPARTMENT, IF ANY OF THE FOLLOWING OCCUR:   Your arms get weak, numb or paralyzed (can't move), especially on one side.   You have vision loss, trouble speaking or problems thinking.   Your speech is abnormal or one side of your face  droops.   You lose consciousness ("pass out") or almost lose consciousness.   You have numbness or tingling after a head, neck or back injury.   You feel very dizzy or like the room is spinning.   You have other concerns.
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
## 2018-04-25 LAB — ECG 12-LEAD
Atrial Rate: 55 {beats}/min
P Axis: 71 degrees
P-R Interval: 136 ms
Q-T Interval: 412 ms
QRS Duration: 86 ms
QTC Calculation (Bezet): 394 ms
R Axis: 61 degrees
T Axis: 36 degrees
Ventricular Rate: 55 {beats}/min
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
## 2018-05-01 ENCOUNTER — Emergency Department
Admission: EM | Admit: 2018-05-01 | Discharge: 2018-05-02 | Disposition: A | Discharge status: Home / Self Care | Payer: Charity | Attending: Emergency Medicine | Admitting: Emergency Medicine

## 2018-05-01 DIAGNOSIS — R5381 Other malaise: Secondary | ICD-10-CM | POA: Insufficient documentation

## 2018-05-01 LAB — TSH: TSH: 1.56 u[IU]/mL (ref 0.35–4.94)

## 2018-05-01 LAB — COMPREHENSIVE METABOLIC PANEL
ALT: 17 U/L (ref 0–55)
AST (SGOT): 15 U/L (ref 5–34)
Albumin/Globulin Ratio: 1.1 (ref 0.9–2.2)
Albumin: 3.9 g/dL (ref 3.5–5.0)
Alkaline Phosphatase: 66 U/L (ref 37–106)
Anion Gap: 10 (ref 5.0–15.0)
BUN: 10 mg/dL (ref 7.0–19.0)
Bilirubin, Total: 0.1 mg/dL — ABNORMAL LOW (ref 0.2–1.2)
CO2: 22 mEq/L (ref 22–29)
Calcium: 9.4 mg/dL (ref 8.5–10.5)
Chloride: 107 mEq/L (ref 100–111)
Creatinine: 0.7 mg/dL (ref 0.6–1.0)
Globulin: 3.7 g/dL — ABNORMAL HIGH (ref 2.0–3.6)
Glucose: 97 mg/dL (ref 70–100)
Potassium: 3.7 mEq/L (ref 3.5–5.1)
Protein, Total: 7.6 g/dL (ref 6.0–8.3)
Sodium: 139 mEq/L (ref 136–145)

## 2018-05-01 LAB — CBC AND DIFFERENTIAL
Absolute NRBC: 0 10*3/uL (ref 0.00–0.00)
Basophils Absolute Automated: 0.03 10*3/uL (ref 0.00–0.08)
Basophils Automated: 0.5 %
Eosinophils Absolute Automated: 0.08 10*3/uL (ref 0.00–0.44)
Eosinophils Automated: 1.4 %
Hematocrit: 38.5 % (ref 34.7–43.7)
Hgb: 11.9 g/dL (ref 11.4–14.8)
Immature Granulocytes Absolute: 0.01 10*3/uL (ref 0.00–0.07)
Immature Granulocytes: 0.2 %
Lymphocytes Absolute Automated: 1.92 10*3/uL (ref 0.42–3.22)
Lymphocytes Automated: 33.9 %
MCH: 26.2 pg (ref 25.1–33.5)
MCHC: 30.9 g/dL — ABNORMAL LOW (ref 31.5–35.8)
MCV: 84.8 fL (ref 78.0–96.0)
MPV: 11.1 fL (ref 8.9–12.5)
Monocytes Absolute Automated: 0.52 10*3/uL (ref 0.21–0.85)
Monocytes: 9.2 %
Neutrophils Absolute: 3.11 10*3/uL (ref 1.10–6.33)
Neutrophils: 54.8 %
Nucleated RBC: 0 /100 WBC (ref 0.0–0.0)
Platelets: 272 10*3/uL (ref 142–346)
RBC: 4.54 10*6/uL (ref 3.90–5.10)
RDW: 15 % (ref 11–15)
WBC: 5.67 10*3/uL (ref 3.10–9.50)

## 2018-05-01 LAB — HCG, SERUM, QUALITATIVE: Hcg Qualitative: NEGATIVE

## 2018-05-01 LAB — GFR: EGFR: >60

## 2018-05-01 LAB — LIPASE: Lipase: 50 U/L (ref 8–78)

## 2018-05-01 LAB — SEDIMENTATION RATE: Sed Rate: 29 mm/Hr — ABNORMAL HIGH (ref 0–20)

## 2018-05-01 LAB — TROPONIN I: Troponin I: <0.01 ng/mL (ref 0.00–0.09)

## 2018-05-01 LAB — MAGNESIUM: Magnesium: 1.8 mg/dL (ref 1.6–2.6)

## 2018-05-01 MED ORDER — SODIUM CHLORIDE 0.9 % IV BOLUS
1000.00 mL | Freq: Once | INTRAVENOUS | Status: AC
Start: 2018-05-01 — End: 2018-05-02
  Administered 2018-05-01: 23:00:00 1000 mL via INTRAVENOUS

## 2018-05-01 MED ORDER — KETOROLAC TROMETHAMINE 30 MG/ML IJ SOLN
30.00 mg | Freq: Once | INTRAMUSCULAR | Status: AC
Start: 2018-05-01 — End: 2018-05-01
  Administered 2018-05-01: 23:00:00 30 mg via INTRAVENOUS
  Filled 2018-05-01: qty 1

## 2018-05-01 NOTE — EDIE (Signed)
COLLECTIVE?NOTIFICATION?05/01/2018 22:45?VEE, BAHE D?MRN: [id number]    Criteria Met      3 Different Facilities in 90 Days    5 ED Visits in 12 Months    Security and Safety  No recent Security Events currently on file    ED Care Guidelines  There are currently no ED Care Guidelines for this patient. Please check your facility's medical records system.          E.D. Visit Count (12 mo.)  Facility Visits   [facility] 2   [place] [facility]: [place] 1   [place] [facility] 3   [place] [facility]: [facility]) 1   [place] [facility] 3   Total 10   Note: Visits indicate total known visits.      Recent Emergency Department Visit Summary  Date Facility [facility] Type Diagnoses or Chief Complaint   May 01, 2018 [facility]: [place] An[facility]. [place] [facility]      headache      Apr 24, 2018 [place] [place] [facility]. [place] [facility]      rescue      Headache      Paresthesia of skin      Feb 28, 2018 [place] [facility] [place] [place] [facility]      L arm and shouler pain x a few months; cough x a few days      Extremity Weakness      Headache      Paresthesia of skin      Feb 19, 2018 [place] [facility] [place] [facility] [place] [place] [facility]      electrical shocks      Tingling      Paresthesia of skin      Feb 12, 2018 [place] [place] [facility]      triage-      triage-electrical impulses/shoulder pain      Generalized electrical impulses      Psychiatric Evaluation      Pain in arm, unspecified      Encounter for other general examination      Feb 12, 2018 [place] [facility] [place] [place] [facility]      triage      triage Electrical Impulses, incontinence      Encounter for screening, unspecified      Jan 07, 2018 [place] [facility] [place] [facility] [place] [place] [facility]      headache, chest pain      Shoulder Pain      Headache      Other headache syndrome      Chest pain, unspecified      Homelessness      Jan 06, 2018 [place] [facility] [place] [place] [facility]      medic-Headache Pt #1 of 2      Knee Pain       Headache      Bronchitis, not specified as acute or chronic      Nov 26, 2017 [place] [facility]: [facility]) [place]. [place] [facility]      pysch evaluation      Psychiatric Evaluation      Brief psychotic disorder      Jul 20, 2017 - [place] [facility]  Emergency      dehydration      Hematuria, unspecified      Urinary tract infection, site not specified      Dehydration      Homelessness  Recent Inpatient Visit Summary  No recorded inpatient visits.     Care Providers  There are no care providers on record at this time.   Collective Portal  This patient has registered at the [place] [facility]: [facility]   For more information visit: [email or web address]     PLEASE NOTE:    1.   Any care recommendations and other clinical information are provided as guidelines or for historical purposes only, and providers should exercise their own clinical judgment when providing care.    2.   You may only use this information for purposes of treatment, payment or health care operations activities, and subject to the limitations of applicable Collective Policies.    3.   You should consult directly with the organization that provided a care guideline or other clinical history with any questions about additional information or accuracy or completeness of information provided.    ? 2019 [place], [employer]. - [email or web address]

## 2018-05-02 NOTE — Discharge Instructions (Signed)
Community Services/Resources    [place] [facility]   [phone]   (assists with housing, Medicaid, and other government agencies)  [place] [facility]   [phone]  [place] [facility]   [phone]    Healthworks   [place]    [phone]    [place]    [phone]    [place] [facility]    [phone]   [email or web address] 44y/o to 44y/o     [place] [facility] ([facility]) [phone]    [place] [facility]     [phone]   (referral and information line)    Drug/Alcohol Referral Service   [phone]  Domestic [place] Hotline   1-800-799-SAFE (7233) Spanish line 800-942-6908  Crisis Hotline           800-838-8238  Chat line vsdvalliance.org  Text 804-793-9999  [place] Information Line for Health and Human Services 2-1-1  [place] Health Insurance Exchange    800-318-2596 or https://localhelp.healthcare.gov/  [place] [facility]    [phone]   Housing Resources     Transportation:   [employer] [phone]  [place] [employer] ([place] [employer]) [phone] (need 48 hour notice)   Monday - Friday 7AM to 7PM  and stops at main hospital entrance  Destination [place] picks up at 7:20 AM and hourly til 6:40 PM   Dest[facility] [place] [facility] 7:40 AM and hourly til 6:40 PM   [employer] (Medicaid Recipient Only) [phone]  Commuter Bus Transportation Services [phone]  [place] [employer] [phone] ([address] in [place])  [place] [employer] [phone] transportation for frail elderly and disabled     Food Pantries:   [place] [employer]      [phone]   Monday, Tuesday, Wednesday, Friday, and Saturday 10AM to 12:45PM   By appointment Tuesday and Thursday 5:45PM to 7:30PM   3 meals for 3 days and eligible 2x a month   Need proof of ID and [place] Resident   [employer]  [phone]   Monday and Tuesday 7PM to 8PM     Need proof of ID first visit   [employer]  (Emergency Food)     [phone]   Call for Appointment   Serve [place], [place], and [place]   [employer] [place]     [phone]   Serve [place] and [place]  [address] [place], [place]     [phone]   Food and Clothing, No deliveries   Monday, Wednesday, Friday 10AM to 12PM  [employer] [facility]      [phone]   [address]  [place],    Monday - Friday 8:30AM-4:30PM Drop-In Day Program (Showers, meals, etc)    For any further resources please consult Case Management
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
## 2018-05-04 LAB — ECG 12-LEAD
Atrial Rate: 70 {beats}/min
P Axis: 92 degrees
P-R Interval: 118 ms
Q-T Interval: 402 ms
QRS Duration: 84 ms
QTC Calculation (Bezet): 434 ms
R Axis: 60 degrees
T Axis: 37 degrees
Ventricular Rate: 70 {beats}/min

## 2018-12-11 ENCOUNTER — Other Ambulatory Visit: Payer: Self-pay

## 2018-12-11 ENCOUNTER — Encounter: Payer: Self-pay | Admitting: *Deleted

## 2018-12-11 ENCOUNTER — Emergency Department
Admission: EM | Admit: 2018-12-11 | Discharge: 2018-12-12 | Disposition: A | Discharge status: Home / Self Care | Payer: Self-pay | Attending: Emergency Medicine | Admitting: Emergency Medicine

## 2018-12-11 DIAGNOSIS — Z5900 Homelessness unspecified: Secondary | ICD-10-CM

## 2018-12-11 DIAGNOSIS — R7401 Elevation of levels of liver transaminase levels: Secondary | ICD-10-CM

## 2018-12-11 DIAGNOSIS — R740 Nonspecific elevation of levels of transaminase and lactic acid dehydrogenase [LDH]: Secondary | ICD-10-CM | POA: Insufficient documentation

## 2018-12-11 LAB — COMPREHENSIVE METABOLIC PANEL
ALBUMIN: 4.2 g/dL (ref 3.5–5.0)
ALK PHOS: 61 U/L (ref 38–126)
ALT: 124 U/L — ABNORMAL HIGH (ref 0–44)
AST: 86 U/L — ABNORMAL HIGH (ref 15–41)
Anion gap: 7 (ref 5–15)
BUN: 18 mg/dL (ref 6–20)
CO2: 29 mmol/L (ref 22–32)
Calcium: 9 mg/dL (ref 8.9–10.3)
Chloride: 100 mmol/L (ref 98–111)
Creatinine, Ser: 0.68 mg/dL (ref 0.44–1.00)
GFR calc Af Amer: >60 mL/min (ref >60)
GFR calc non Af Amer: >60 mL/min (ref >60)
Glucose, Bld: 172 mg/dL — ABNORMAL HIGH (ref 70–99)
Potassium: 3.4 mmol/L — ABNORMAL LOW (ref 3.5–5.1)
Sodium: 136 mmol/L (ref 135–145)
Total Bilirubin: 0.6 mg/dL (ref 0.3–1.2)
Total Protein: 7.7 g/dL (ref 6.5–8.1)

## 2018-12-11 LAB — CBC
HCT: 36.2 % (ref 36.0–46.0)
HEMOGLOBIN: 11.1 g/dL — AB (ref 12.0–15.0)
MCH: 26.1 pg (ref 26.0–34.0)
MCHC: 30.7 g/dL (ref 30.0–36.0)
MCV: 85 fL (ref 80.0–100.0)
Platelets: 336 10*3/uL (ref 150–400)
RBC: 4.26 MIL/uL (ref 3.87–5.11)
RDW: 14.8 % (ref 11.5–15.5)
WBC: 5.8 10*3/uL (ref 4.0–10.5)
nRBC: 0 % (ref 0.0–0.2)

## 2018-12-11 LAB — SALICYLATE LEVEL: Salicylate Lvl: <7 mg/dL (ref 2.8–30.0)

## 2018-12-11 LAB — URINE DRUG SCREEN, QUALITATIVE (ARMC ONLY)
Amphetamines, Ur Screen: NOT DETECTED
Barbiturates, Ur Screen: NOT DETECTED
Benzodiazepine, Ur Scrn: NOT DETECTED
COCAINE METABOLITE, UR ~~LOC~~: NOT DETECTED
Cannabinoid 50 Ng, Ur ~~LOC~~: NOT DETECTED
MDMA (Ecstasy)Ur Screen: NOT DETECTED
Methadone Scn, Ur: NOT DETECTED
Opiate, Ur Screen: NOT DETECTED
Phencyclidine (PCP) Ur S: NOT DETECTED
TRICYCLIC, UR SCREEN: NOT DETECTED

## 2018-12-11 LAB — ETHANOL: Alcohol, Ethyl (B): <10 mg/dL (ref <10)

## 2018-12-11 LAB — ACETAMINOPHEN LEVEL: Acetaminophen (Tylenol), Serum: <10 ug/mL — ABNORMAL LOW (ref 10–30)

## 2018-12-11 NOTE — ED Notes (Signed)
Hourly rounding reveals patient sleeping in room. No complaints, stable, in no acute distress. Q15 minute rounds and monitoring via Security Cameras to continue. 

## 2018-12-11 NOTE — ED Notes (Signed)
Patient appropriate and cooperative, NAD noted

## 2018-12-11 NOTE — Discharge Instructions (Signed)
Your liver labs are slightly abnormal.  [facility].  Do not drink any alcohol or take tylenol until evaluated by primary care doctor.
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
## 2018-12-11 NOTE — ED Notes (Signed)
Report to include Situation, Background, Assessment, and Recommendations received from Rhea RN. Patient alert and oriented, warm and dry, in no acute distress. Patient denies SI, HI, AVH and pain. Patient made aware of Q15 minute rounds and security cameras for their safety. Patient instructed to come to me with needs or concerns. 
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
## 2018-12-11 NOTE — Clinical Social Work Note (Signed)
CSW received a consult for "[facility] ed." Patient homeless and needing shelter. CSW left voicemail x2 for director of [employer] and called the shelter directly [phone] / [phone]), but no one answered (left voicemail). CSW continuing to follow for transitions of care.    Corlis Hove, Theresia Majors, [facility] Clinical Social Worker-Emergency Department [phone]

## 2018-12-12 ENCOUNTER — Encounter: Payer: Self-pay | Admitting: Emergency Medicine

## 2018-12-12 ENCOUNTER — Emergency Department
Admission: EM | Admit: 2018-12-12 | Discharge: 2018-12-12 | Disposition: A | Discharge status: Home / Self Care | Payer: Self-pay | Attending: Emergency Medicine | Admitting: Emergency Medicine

## 2018-12-12 ENCOUNTER — Other Ambulatory Visit: Payer: Self-pay

## 2018-12-12 DIAGNOSIS — Z711 Person with feared health complaint in whom no diagnosis is made: Secondary | ICD-10-CM | POA: Insufficient documentation

## 2018-12-12 DIAGNOSIS — Z590 Homelessness: Secondary | ICD-10-CM | POA: Insufficient documentation
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
## 2018-12-12 NOTE — Clinical Social Work Note (Signed)
Clinical Education officer, museum (CSW) staffed with EDP Dr. Mariea Clonts and [facility] RN Donneta Romberg. CSW made referral for shelter to [employer] [phone]).  CSW met with patient at bedside. CSW introduced self and explained role. Patient stated she's been in /[place] for the past 2 months living on the streets. Patient stated "all my family dead" and she was living in [place] previously. Patient stated she was fired from her job in 2013 and "lost her townhouse and truck." Patient stated she was most recently at [employer] in [place]" (shelter), but was dismissed from the program for getting into an argument with the counselor. Patient stated she does not have an ID, but has her paperwork from prison. Patient stated she does not have, nor has she ever applied for SSI. Patient stated she does not have a source of income or anyone with which she can stay.   CSW discussed and provided patient with a listing of local resources and [place]. CSW also provided a bus pass for patient. Patient stated she is "tired of being sent to all these different shelters and institutions" and wants a "bus ticket back to [place]." [place] observed patient begin to raise her voice as she expressed her frustration about "going from shelter to shelter and institution to institution" and medical needs not being addressed. CSW empathized with patient and validated her feelings. CSW encouraged patient to follow through with the shelter case manager and Social Services to assist with obtaining employment, housing, and applying for SSI, if needed. CSW also encouraged patient to establish a medical provider with [facility] so that her medical needs can be address by a provider ongoing. Patient calm again upon CSW's departure from room. CSW updated [facility] RN [place], who confirmed patient has a coat. CSW signing off as no further Social Work needs identified.   Stephanie [name], Latanya Presser,  [place] Worker-Emergency Department [phone]

## 2018-12-12 NOTE — ED Notes (Signed)
Hourly rounding reveals patient sleeping in room. No complaints, stable, in no acute distress. Q15 minute rounds and monitoring via Security Cameras to continue. 
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
## 2018-12-12 NOTE — ED Notes (Signed)
Patient discharged , patient received discharge papers. Patient received belongings and verbalized she has received all of her belongings. Patient appropriate and cooperative, Denies SI/HI AVH. Vital signs taken. NAD noted. 

## 2018-12-12 NOTE — ED Notes (Signed)
Patient talking to the Child psychotherapist
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
## 2018-12-22 ENCOUNTER — Emergency Department
Admission: EM | Admit: 2018-12-22 | Discharge: 2018-12-23 | Disposition: A | Discharge status: Home / Self Care | Payer: Self-pay | Attending: Emergency Medicine | Admitting: Emergency Medicine

## 2018-12-22 ENCOUNTER — Other Ambulatory Visit: Payer: Self-pay

## 2018-12-22 DIAGNOSIS — Z5900 Homelessness unspecified: Secondary | ICD-10-CM

## 2018-12-22 DIAGNOSIS — T730XXA Starvation, initial encounter: Secondary | ICD-10-CM | POA: Insufficient documentation

## 2018-12-22 DIAGNOSIS — R51 Headache: Secondary | ICD-10-CM

## 2018-12-22 DIAGNOSIS — R519 Headache, unspecified: Secondary | ICD-10-CM

## 2018-12-22 NOTE — ED Triage Notes (Signed)
Patient reports having sharpe pains to her head off/on all afternoon.

## 2018-12-22 NOTE — ED Provider Notes (Signed)
[facility] Emergency Department Provider Note   ____________________________________________   First MD Initiated Contact with Patient 12/22/18 2310     (approximate)  I have reviewed the triage vital signs and the nursing notes.   HISTORY  Chief Complaint Headache    HPI Brett CanalesOwetta D Strong is a 43 y.o. female who reports headache all day.  States she has a history of migraines but does not take anything for them.  Also notes she may have "bugs" as well as point tenderness to her scalp.  Admits she is homeless and currently sleeping at the entrance of the [employer].  Reports occasional nausea but attributes that to hunger.  Denies recent fever, chills, vision changes, neck pain, chest pain, shortness of breath, abdominal pain, vomiting, dysuria, diarrhea.  Denies recent travel or trauma.  Denies use of anticoagulants.    Past medical history None  There are no active problems to display for this patient.   No past surgical history on file.  Prior to Admission medications   Not on File    Allergies Penicillins  No family history on file.  Social History Social History   Tobacco Use  . Smoking status: Never Smoker  . Smokeless tobacco: Never Used  Substance Use Topics  . Alcohol use: Never    Frequency: Never  . Drug use: Never    Review of Systems  Constitutional: No fever/chills Eyes: No visual changes. ENT: No sore throat. Cardiovascular: Denies chest pain. Respiratory: Denies shortness of breath. Gastrointestinal: No abdominal pain.  No nausea, no vomiting.  No diarrhea.  No constipation. Genitourinary: Negative for dysuria. Musculoskeletal: Negative for back pain. Skin: Negative for rash. Neurological: Positive for headache.  Negative for focal weakness or numbness.   ____________________________________________   PHYSICAL EXAM:  VITAL SIGNS: ED Triage Vitals  Enc Vitals Group     BP 12/22/18 2137 (!) 147/85    Pulse Rate 12/22/18 2137 96     Resp 12/22/18 2137 16     Temp 12/22/18 2137 97.7 F (36.5 C)     Temp Source 12/22/18 2137 Oral     SpO2 12/22/18 2137 100 %     Weight --      Height --      Head Circumference --      Peak Flow --      Pain Score 12/22/18 2135 8     Pain Loc --      Pain Edu? --      Excl. in GC? --     Constitutional: Alert and oriented. Well appearing and in no acute distress.  Disheveled and malodorous. Eyes: Conjunctivae are normal. PERRL. EOMI. Head: Atraumatic. Nose: No congestion/rhinnorhea. Mouth/Throat: Mucous membranes are moist.  Oropharynx non-erythematous. Neck: No stridor.  No carotid bruits.  Supple neck without meningismus. Cardiovascular: Normal rate, regular rhythm. Grossly normal heart sounds.  Good peripheral circulation. Respiratory: Normal respiratory effort.  No retractions. Lungs CTAB. Gastrointestinal: Soft and nontender. No distention. No abdominal bruits. No CVA tenderness. Musculoskeletal: No lower extremity tenderness nor edema.  No joint effusions. Neurologic: Alert and oriented x3.  CN II-XII grossly intact.  Normal speech and language. No gross focal neurologic deficits are appreciated. No gait instability. Skin:  Skin is warm, dry and intact. No rash noted. Psychiatric: Mood and affect are normal. Speech and behavior are normal.  ____________________________________________   LABS (all labs ordered are listed, but only abnormal results are displayed)  Labs Reviewed - No data to  display ____________________________________________  EKG  None ____________________________________________  RADIOLOGY  ED MD interpretation: None  Official radiology report(s): No results found.  ____________________________________________   PROCEDURES  Procedure(s) performed: None  Procedures  Critical Care performed: No  ____________________________________________   INITIAL IMPRESSION / ASSESSMENT AND PLAN / ED  COURSE  As part of my medical decision making, I reviewed the following data within the electronic MEDICAL RECORD NUMBER Nursing notes reviewed and incorporated, Old chart reviewed and Notes from prior ED visits    43 year old homeless female who presents initially with a chief complaint of headache, but admits she is homeless and hungry.  It is currently the middle of winter, cold and raining.  Will offer patient shower, food and see if we can get her to the shelter overnight.  If not, will consult social work for assistance.  Clinical Course as of Dec 24 655  Sat Dec 23, 2018  0033 Patient declined social work consult.   [JS]  [id number] Patient slept overnight without distress.  Will discharge once the son comes up.  Strict return precautions given.  Patient verbalizes understanding and agrees with plan of care.   [JS]    Clinical Course User Index [JS] Irean HongSung, Zailynn Brandel J, MD     ____________________________________________   FINAL CLINICAL IMPRESSION(S) / ED DIAGNOSES  Final diagnoses:  Homeless  Hungry, initial encounter  Nonintractable headache, unspecified chronicity pattern, unspecified headache type     ED Discharge Orders    None       Note:  This document was prepared using Dragon voice recognition software and may include unintentional dictation errors.    Irean HongSung, Nivaan Dicenzo J, MD 12/23/18 [id number]

## 2018-12-23 NOTE — ED Notes (Signed)
Pt given sandwich tray, drink, crackers and PB. Offered shower, pt refused.

## 2018-12-23 NOTE — Discharge Instructions (Signed)
Return to the ER for worsening symptoms, persistent vomiting, difficulty breathing or other concerns. °

## 2019-03-08 ENCOUNTER — Other Ambulatory Visit: Payer: Self-pay

## 2019-03-08 ENCOUNTER — Emergency Department
Admission: EM | Admit: 2019-03-08 | Discharge: 2019-03-08 | Disposition: A | Discharge status: Home / Self Care | Payer: Self-pay | Attending: Student in an Organized Health Care Education/Training Program | Admitting: Student in an Organized Health Care Education/Training Program

## 2019-03-08 ENCOUNTER — Encounter: Payer: Self-pay | Admitting: Emergency Medicine

## 2019-03-08 DIAGNOSIS — Z590 Homelessness: Secondary | ICD-10-CM | POA: Insufficient documentation

## 2019-03-08 DIAGNOSIS — R462 Strange and inexplicable behavior: Secondary | ICD-10-CM

## 2019-03-08 DIAGNOSIS — F23 Brief psychotic disorder: Secondary | ICD-10-CM | POA: Insufficient documentation

## 2019-03-08 LAB — COMPREHENSIVE METABOLIC PANEL
ALT: 25 U/L (ref 0–44)
AST: 28 U/L (ref 15–41)
Albumin: 4.2 g/dL (ref 3.5–5.0)
Alkaline Phosphatase: 65 U/L (ref 38–126)
Anion gap: 9 (ref 5–15)
BUN: 18 mg/dL (ref 6–20)
CO2: 26 mmol/L (ref 22–32)
Calcium: 9.4 mg/dL (ref 8.9–10.3)
Chloride: 105 mmol/L (ref 98–111)
Creatinine, Ser: 0.73 mg/dL (ref 0.44–1.00)
GFR calc Af Amer: >60 mL/min (ref >60)
GFR calc non Af Amer: >60 mL/min (ref >60)
Glucose, Bld: 111 mg/dL — ABNORMAL HIGH (ref 70–99)
Potassium: 3.8 mmol/L (ref 3.5–5.1)
Sodium: 140 mmol/L (ref 135–145)
Total Bilirubin: 0.4 mg/dL (ref 0.3–1.2)
Total Protein: 8.1 g/dL (ref 6.5–8.1)

## 2019-03-08 LAB — CBC WITH DIFFERENTIAL/PLATELET
Abs Immature Granulocytes: 0.01 10*3/uL (ref 0.00–0.07)
Basophils Absolute: 0 10*3/uL (ref 0.0–0.1)
Basophils Relative: 1 %
Eosinophils Absolute: 0.1 10*3/uL (ref 0.0–0.5)
Eosinophils Relative: 1 %
HCT: 38.3 % (ref 36.0–46.0)
Hemoglobin: 11.8 g/dL — ABNORMAL LOW (ref 12.0–15.0)
Immature Granulocytes: 0 %
Lymphocytes Relative: 35 %
Lymphs Abs: 1.5 10*3/uL (ref 0.7–4.0)
MCH: 26.1 pg (ref 26.0–34.0)
MCHC: 30.8 g/dL (ref 30.0–36.0)
MCV: 84.7 fL (ref 80.0–100.0)
Monocytes Absolute: 0.5 10*3/uL (ref 0.1–1.0)
Monocytes Relative: 11 %
Neutro Abs: 2.2 10*3/uL (ref 1.7–7.7)
Neutrophils Relative %: 52 %
Platelets: 293 10*3/uL (ref 150–400)
RBC: 4.52 MIL/uL (ref 3.87–5.11)
RDW: 14.4 % (ref 11.5–15.5)
WBC: 4.2 10*3/uL (ref 4.0–10.5)
nRBC: 0 % (ref 0.0–0.2)

## 2019-03-08 LAB — ETHANOL: Alcohol, Ethyl (B): <10 mg/dL (ref <10)

## 2019-03-08 LAB — TSH: TSH: 2.003 u[IU]/mL (ref 0.350–4.500)

## 2019-03-08 LAB — SALICYLATE LEVEL: Salicylate Lvl: <7 mg/dL (ref 2.8–30.0)

## 2019-03-08 LAB — HCG, QUANTITATIVE, PREGNANCY: hCG, Beta Chain, Quant, S: <1 m[IU]/mL (ref <5)

## 2019-03-08 LAB — ACETAMINOPHEN LEVEL: Acetaminophen (Tylenol), Serum: <10 ug/mL — ABNORMAL LOW (ref 10–30)
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
## 2019-03-08 NOTE — ED Notes (Signed)
IVC PAPERS  RESCINDED PER DR  CLARY MD  PT  NOW  VOL
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
## 2019-03-08 NOTE — Discharge Instructions (Addendum)
Follow up with [facility].  Return for any additional questions, worsening symptoms or concerns.
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
## 2019-03-08 NOTE — ED Notes (Signed)
Patient walked to lobby with all belongings. Patient given bus pass and bus schedule. Ambulatory with steady gait. No apparent distress noted.

## 2019-03-08 NOTE — ED Notes (Signed)
PT  PLACED UNDER  IVC PAPERS  INFORMED  LAURA  RN  AND  ODS  OFFICER

## 2019-03-30 ENCOUNTER — Encounter (HOSPITAL_COMMUNITY): Payer: Self-pay | Admitting: Emergency Medicine

## 2019-03-30 ENCOUNTER — Emergency Department (HOSPITAL_COMMUNITY)
Admission: EM | Admit: 2019-03-30 | Discharge: 2019-03-31 | Disposition: A | Discharge status: Other Acute Inpatient Hospital | Payer: Self-pay | Attending: Emergency Medicine | Admitting: Emergency Medicine

## 2019-03-30 DIAGNOSIS — R462 Strange and inexplicable behavior: Secondary | ICD-10-CM

## 2019-03-30 DIAGNOSIS — F259 Schizoaffective disorder, unspecified: Secondary | ICD-10-CM | POA: Insufficient documentation

## 2019-03-30 LAB — BASIC METABOLIC PANEL
Anion gap: 11 (ref 5–15)
BUN: 22 mg/dL — ABNORMAL HIGH (ref 6–20)
CO2: 25 mmol/L (ref 22–32)
Calcium: 9 mg/dL (ref 8.9–10.3)
Chloride: 101 mmol/L (ref 98–111)
Creatinine, Ser: 0.74 mg/dL (ref 0.44–1.00)
GFR calc Af Amer: >60 mL/min (ref >60)
GFR calc non Af Amer: >60 mL/min (ref >60)
Glucose, Bld: 73 mg/dL (ref 70–99)
Potassium: 3.7 mmol/L (ref 3.5–5.1)
Sodium: 137 mmol/L (ref 135–145)

## 2019-03-30 LAB — CBC WITH DIFFERENTIAL/PLATELET
Abs Immature Granulocytes: 0.01 10*3/uL (ref 0.00–0.07)
Basophils Absolute: 0 10*3/uL (ref 0.0–0.1)
Basophils Relative: 0 %
Eosinophils Absolute: 0 10*3/uL (ref 0.0–0.5)
Eosinophils Relative: 0 %
HCT: 39.1 % (ref 36.0–46.0)
Hemoglobin: 11.8 g/dL — ABNORMAL LOW (ref 12.0–15.0)
Immature Granulocytes: 0 %
Lymphocytes Relative: 34 %
Lymphs Abs: 1.8 10*3/uL (ref 0.7–4.0)
MCH: 26 pg (ref 26.0–34.0)
MCHC: 30.2 g/dL (ref 30.0–36.0)
MCV: 86.1 fL (ref 80.0–100.0)
Monocytes Absolute: 0.5 10*3/uL (ref 0.1–1.0)
Monocytes Relative: 8 %
Neutro Abs: 3.1 10*3/uL (ref 1.7–7.7)
Neutrophils Relative %: 58 %
Platelets: 273 10*3/uL (ref 150–400)
RBC: 4.54 MIL/uL (ref 3.87–5.11)
RDW: 14.6 % (ref 11.5–15.5)
WBC: 5.5 10*3/uL (ref 4.0–10.5)
nRBC: 0 % (ref 0.0–0.2)

## 2019-03-30 MED ORDER — IBUPROFEN 200 MG PO TABS
600.0000 mg | ORAL_TABLET | Freq: Once | ORAL | Status: AC
  Administered 2019-03-30: 21:00:00 600 mg via ORAL
  Filled 2019-03-30: qty 3
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
## 2019-03-30 NOTE — ED Notes (Signed)
Pt aware urine sample is needed 
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
## 2019-03-30 NOTE — ED Notes (Signed)
Asked for urine  

## 2019-03-30 NOTE — ED Notes (Signed)
Bed: DI26 Expected date:  Expected time:  Means of arrival:  Comments: EMS 43 yo female headache/psychiatric complaints

## 2019-03-30 NOTE — ED Notes (Signed)
TSS machine in room  

## 2019-03-30 NOTE — Progress Notes (Signed)
TTS consulted with Nira Conn, NP who recommends inpt tx. EDP Mia, PA has been advised. [facility] to review for possible admission.  TTS attempted to inform RN but was told he is currently with another pt.   Princess Bruins, MSW, LCSW Therapeutic Triage Specialist  [phone]

## 2019-03-30 NOTE — ED Notes (Signed)
Pt reminded about a urine specimen. She said she was unable to provide one at the present time.

## 2019-03-31 ENCOUNTER — Other Ambulatory Visit: Payer: Self-pay

## 2019-03-31 ENCOUNTER — Encounter (HOSPITAL_COMMUNITY): Payer: Self-pay

## 2019-03-31 ENCOUNTER — Inpatient Hospital Stay (HOSPITAL_COMMUNITY)
Admission: AD | Admit: 2019-03-31 | Discharge: 2019-04-04 | DRG: 885 | Disposition: A | Discharge status: Home / Self Care | Payer: Self-pay | Source: Intra-hospital | Attending: Psychiatry | Admitting: Psychiatry

## 2019-03-31 DIAGNOSIS — G4700 Insomnia, unspecified: Secondary | ICD-10-CM | POA: Diagnosis present

## 2019-03-31 DIAGNOSIS — F419 Anxiety disorder, unspecified: Secondary | ICD-10-CM | POA: Diagnosis present

## 2019-03-31 DIAGNOSIS — F259 Schizoaffective disorder, unspecified: Principal | ICD-10-CM | POA: Diagnosis present

## 2019-03-31 DIAGNOSIS — Z590 Homelessness: Secondary | ICD-10-CM

## 2019-03-31 MED ORDER — TRAZODONE HCL 50 MG PO TABS: 50.0000 mg | ORAL_TABLET | Freq: Every evening | ORAL | Status: DC | PRN

## 2019-03-31 MED ORDER — ALUM & MAG HYDROXIDE-SIMETH 200-200-20 MG/5ML PO SUSP: 30.0000 mL | ORAL | Status: DC | PRN

## 2019-03-31 MED ORDER — ACETAMINOPHEN 325 MG PO TABS
650.0000 mg | ORAL_TABLET | Freq: Four times a day (QID) | ORAL | Status: DC | PRN
  Administered 2019-03-31: 650 mg via ORAL
  Filled 2019-03-31 (×2): qty 2

## 2019-03-31 MED ORDER — HALOPERIDOL 2 MG PO TABS
2.0000 mg | ORAL_TABLET | Freq: Two times a day (BID) | ORAL | Status: DC
  Administered 2019-03-31 – 2019-04-02 (×4): 2 mg via ORAL
  Filled 2019-03-31 (×7): qty 1

## 2019-03-31 MED ORDER — MAGNESIUM HYDROXIDE 400 MG/5ML PO SUSP: 30.0000 mL | Freq: Every day | ORAL | Status: DC | PRN

## 2019-03-31 MED ORDER — HYDROXYZINE HCL 25 MG PO TABS: 25.0000 mg | ORAL_TABLET | Freq: Three times a day (TID) | ORAL | Status: DC | PRN

## 2019-03-31 NOTE — Progress Notes (Signed)
Writer has observed patient lying in bed asleep , no distress noted. Patient has been asleep since shift change. Safety maintained on unit with 15 min checks.

## 2019-03-31 NOTE — ED Notes (Signed)
Tech giving pt clothes from [employer]

## 2019-03-31 NOTE — [facility] Group Notes (Signed)
[facility] Group Notes: (Clinical Social Work)   03/31/2019      Type of Therapy:  Group Therapy   Participation Level:  Did Not Attend - was invited both individually by [facility] and by overhead announcement   Ambrose Mantle, LCSW 03/31/2019, 2:40 PM    s

## 2019-03-31 NOTE — ED Notes (Signed)
Called [facility] spoke w linda gave report  Called [facility] for transport

## 2019-03-31 NOTE — Progress Notes (Signed)
Pt accepted to [facility] 504-1 to Dr. Otelia Santee, MD. Call to report [phone]. ED staff charge nurse Terri, RN has been advised.

## 2019-03-31 NOTE — Plan of Care (Signed)
Progress note  Pt found in bed; allowed to rest. Pt compliant with medication administration upon awakening. Pt ate lunch and and is now resting again. Pt did complain of a headache that she rates at a 4/10. Pt provided support and encouragement. Pt given medication per protocol and standing orders. Q70m safety checks implemented and continued. Will continue to monitor.   Pt progressing in the following metrics  Problem: Education: Goal: Knowledge of [facility] Education information/materials will improve Outcome: Progressing Goal: Emotional status will improve Outcome: Progressing Goal: Mental status will improve Outcome: Progressing Goal: Verbalization of understanding the information provided will improve Outcome: Progressing

## 2019-03-31 NOTE — H&P (Signed)
Psychiatric Admission Assessment Adult  Patient Identification: Stephanie [name] MRN:  [id number] Date of Evaluation:  03/31/2019 Chief Complaint:  Schizoaffective Disorder Principal Diagnosis: <principal problem not specified> Diagnosis:  Active Problems:   Schizoaffective disorder (HCC)  History of Present Illness: Patient is seen and examined.  Patient is a 43 year old female with a probable past psychiatric history significant for schizophrenia who presented voluntarily to the emergency department.  The patient had been found on [address] yelling and screaming.  These were obscenities by report.  She stated that she had been burned by chemicals and sprayed in her eyes by chemicals.  The patient had apparently been seen by TDS and expressed delusional thinking.  She believes she had 3 heart attacks on the date of admission.  I am familiar with the patient.  She had been seen in the [facility] emergency department on 03/08/2019.  She has multiple ER visits there, and had been seen there for multiple somatic complaints as well as being homeless and hungry.  She was offered psychiatric hospitalization at the time as well as psychiatric medications but declined those.  It did not appear as though the patient was able to be involuntarily committed at that time.  She does report her last psychiatric hospitalization was sometime in [place] in the past.  She did admit that she had taken Seroquel in the past as well as Risperdal.  "None of it really helped".  "Will it help my heart?".  She is lying on the bed, and pleasant.  She denied any suicidal or homicidal ideation.  She is clearly psychotic and delusional, but pleasant.  At least at this point we cannot force medications, but I have offered her Haldol and the long-acting Haldol injection as well as Abilify and the long-acting injection.  She was admitted to the hospital for evaluation and stabilization  Associated Signs/Symptoms: Depression  Symptoms:  anhedonia, insomnia, psychomotor agitation, fatigue, suicidal thoughts without plan, anxiety, loss of energy/fatigue, disturbed sleep, (Hypo) Manic Symptoms:  Delusions, Elevated Mood, Grandiosity, Hallucinations, Impulsivity, Irritable Mood, Anxiety Symptoms:  Excessive Worry, Psychotic Symptoms:  Delusions, Hallucinations: Auditory Paranoia, PTSD Symptoms: Negative Total Time spent with patient: 30 minutes  Past Psychiatric History: It appears as though her last psychiatric inpatient hospitalization was sometime in [place] in the past.  She has multiple emergency room visits.  She is usually calm but delusional in the emergency room's.  She is usually there for somatic complaints, or for food.  It is unclear when the last time she really took any psychiatric medications is.  Is the patient at risk to self? No.  Has the patient been a risk to self in the past 6 months? No.  Has the patient been a risk to self within the distant past? No.  Is the patient a risk to others? No.  Has the patient been a risk to others in the past 6 months? No.  Has the patient been a risk to others within the distant past? No.   Prior Inpatient Therapy:   Prior Outpatient Therapy:    Alcohol Screening: 1. How often do you have a drink containing alcohol?: Never 2. How many drinks containing alcohol do you have on a typical day when you are drinking?: 1 or 2 3. How often do you have six or more drinks on one occasion?: Never AUDIT-C Score: 0 4. How often during the last year have you found that you were not able to stop drinking once you had started?: Never  5. How often during the last year have you failed to do what was normally expected from you becasue of drinking?: Never 6. How often during the last year have you needed a first drink in the morning to get yourself going after a heavy drinking session?: Never 7. How often during the last year have you had a feeling of guilt of  remorse after drinking?: Never 8. How often during the last year have you been unable to remember what happened the night before because you had been drinking?: Never 9. Have you or someone else been injured as a result of your drinking?: No 10. Has a relative or friend or a doctor or another health worker been concerned about your drinking or suggested you cut down?: No Alcohol Use Disorder Identification Test Final Score (AUDIT): 0 Substance Abuse History in the last 12 months:  No. Consequences of Substance Abuse: Negative Previous Psychotropic Medications: Yes  Psychological Evaluations: Yes  Past Medical History: History reviewed. No pertinent past medical history. History reviewed. No pertinent surgical history. Family History: History reviewed. No pertinent family history. Family Psychiatric  History: Patient denied Tobacco Screening: Have you used any form of tobacco in the last 30 days? (Cigarettes, Smokeless Tobacco, Cigars, and/or Pipes): No Social History:  Social History   Substance and Sexual Activity  Alcohol Use Never  . Frequency: Never     Social History   Substance and Sexual Activity  Drug Use Never    Additional Social History:                           Allergies:   Allergies  Allergen Reactions  . Penicillins Nausea Only    Did it involve swelling of the face/tongue/throat, SOB, or low BP? No Did it involve sudden or severe rash/hives, skin peeling, or any reaction on the inside of your mouth or nose? No Did you need to seek medical attention at a hospital or doctor's office? No When did it last happen?unknown  If all above answers are "NO", may proceed with cephalosporin use.    Lab Results:  Results for orders placed or performed during the hospital encounter of 03/30/19 (from the past 48 hour(s))  CBC with Differential     Status: Abnormal   Collection Time: 03/30/19  8:38 PM  Result Value Ref Range   WBC 5.5 4.0 - 10.5 K/uL    RBC 4.54 3.87 - 5.11 MIL/uL   Hemoglobin 11.8 (L) 12.0 - 15.0 g/dL   HCT 16.139.1 09.636.0 - 04.546.0 %   MCV 86.1 80.0 - 100.0 fL   MCH 26.0 26.0 - 34.0 pg   MCHC 30.2 30.0 - 36.0 g/dL   RDW 40.914.6 81.111.5 - 91.415.5 %   Platelets 273 150 - 400 K/uL   nRBC 0.0 0.0 - 0.2 %   Neutrophils Relative % 58 %   Neutro Abs 3.1 1.7 - 7.7 K/uL   Lymphocytes Relative 34 %   Lymphs Abs 1.8 0.7 - 4.0 K/uL   Monocytes Relative 8 %   Monocytes Absolute 0.5 0.1 - 1.0 K/uL   Eosinophils Relative 0 %   Eosinophils Absolute 0.0 0.0 - 0.5 K/uL   Basophils Relative 0 %   Basophils Absolute 0.0 0.0 - 0.1 K/uL   Immature Granulocytes 0 %   Abs Immature Granulocytes 0.01 0.00 - 0.07 K/uL    Comment: Performed at [facility], [address] [place], [place] [id number]  Basic  metabolic panel     Status: Abnormal   Collection Time: 03/30/19  8:38 PM  Result Value Ref Range   Sodium 137 135 - 145 mmol/L   Potassium 3.7 3.5 - 5.1 mmol/L   Chloride 101 98 - 111 mmol/L   CO2 25 22 - 32 mmol/L   Glucose, Bld 73 70 - 99 mg/dL   BUN 22 (H) 6 - 20 mg/dL   Creatinine, Ser 2.59 0.44 - 1.00 mg/dL   Calcium 9.0 8.9 - 56.3 mg/dL   GFR calc non Af Amer >60 >60 mL/min   GFR calc Af Amer >60 >60 mL/min   Anion gap 11 5 - 15    Comment: Performed at [facility], [address] [place], [place] [address]    Blood Alcohol level:  Lab Results  Component Value Date   ETH <10 03/08/2019   ETH <10 12/11/2018    Metabolic Disorder Labs:  No results found for: HGBA1C, MPG No results found for: PROLACTIN No results found for: CHOL, TRIG, HDL, CHOLHDL, VLDL, LDLCALC  Current Medications: Current Facility-Administered Medications  Medication Dose Route Frequency Provider Last Rate Last Dose  . acetaminophen (TYLENOL) tablet 650 mg  650 mg Oral Q6H PRN Nira Conn A, NP   650 mg at 03/31/19 1221  . alum & mag hydroxide-simeth (MAALOX/MYLANTA) 200-200-20 MG/5ML suspension 30 mL  30 mL Oral Q4H PRN  Nira Conn A, NP      . haloperidol (HALDOL) tablet 2 mg  2 mg Oral BID Antonieta Pert, MD   2 mg at 03/31/19 1221  . hydrOXYzine (ATARAX/VISTARIL) tablet 25 mg  25 mg Oral TID PRN Jackelyn Poling, NP      . magnesium hydroxide (MILK OF MAGNESIA) suspension 30 mL  30 mL Oral Daily PRN Nira Conn A, NP      . traZODone (DESYREL) tablet 50 mg  50 mg Oral QHS PRN Nira Conn A, NP       PTA Medications: No medications prior to admission.    Musculoskeletal: Strength & Muscle Tone: within normal limits Gait & Station: normal Patient leans: N/A  Psychiatric Specialty Exam: Physical Exam  Nursing note and vitals reviewed. Constitutional: She is oriented to person, place, and time. She appears well-developed and well-nourished.  HENT:  Head: Normocephalic and atraumatic.  Respiratory: Effort normal.  Neurological: She is alert and oriented to person, place, and time.    ROS  Blood pressure 115/76, pulse 80, temperature 98 F (36.7 C), temperature source Oral, resp. rate 18, height 4' 11.84" (1.52 m), weight 64.4 kg, last menstrual period 03/04/2019.Body mass index is 27.88 kg/m.  General Appearance: Disheveled  Eye Contact:  Fair  Speech:  Normal Rate  Volume:  Normal  Mood:  Dysphoric  Affect:  Constricted  Thought Process:  Disorganized and Descriptions of Associations: Loose  Orientation:  Full (Time, Place, and Person)  Thought Content:  Illogical, Delusions and Paranoid Ideation  Suicidal Thoughts:  No  Homicidal Thoughts:  No  Memory:  Immediate;   Poor Recent;   Poor Remote;   Poor  Judgement:  Impaired  Insight:  Lacking  Psychomotor Activity:  Normal  Concentration:  Concentration: Fair and Attention Span: Fair  Recall:  [employer] of Knowledge:  Fair  Language:  Fair  Akathisia:  Negative  Handed:  Right  AIMS (if indicated):     Assets:  Desire for Improvement Resilience  ADL's:  Impaired  Cognition:  WNL  Sleep:  Treatment Plan  Summary: Daily contact with patient to assess and evaluate symptoms and progress in treatment, Medication management and Plan : Patient is seen and examined.  Patient is a 43 year old female with a probable past psychiatric history significant for schizophrenia or schizoaffective disorder.  She was admitted to the hospital, and we will integrate her into the milieu.  She will be encouraged to attend groups.  She has been offered antipsychotic medications at this time, but she is refusing currently.  She is not suicidal or homicidal.  She is not aggressive at this time.  This was basically the way she was when she was seen in the [facility] emergency department.  She does not fulfill involuntary commitment at this time, she does not fulfill forced medications at this time.  Her laboratories were reviewed, and they are essentially normal.  A drug screen was not obtained, and we will attempt to get that done this morning.  I will offer her Risperdal 0.5 mg p.o. daily and 1 mg p.o. nightly, but from review of the electronic medical record and my familiarity with this patient I seriously doubt she will take this medication.  Observation Level/Precautions:  15 minute checks  Laboratory:  Chemistry Profile  Psychotherapy:    Medications:    Consultations:    Discharge Concerns:    Estimated LOS:  Other:     Physician Treatment Plan for Primary Diagnosis: <principal problem not specified> Long Term Goal(s): Improvement in symptoms so as ready for discharge  Short Term Goals: Ability to identify changes in lifestyle to reduce recurrence of condition will improve, Ability to verbalize feelings will improve, Ability to demonstrate self-control will improve, Ability to identify and develop effective coping behaviors will improve and Ability to maintain clinical measurements within normal limits will improve  Physician Treatment Plan for Secondary Diagnosis: Active Problems:   Schizoaffective disorder  (HCC)  Long Term Goal(s): Improvement in symptoms so as ready for discharge  Short Term Goals: Ability to identify changes in lifestyle to reduce recurrence of condition will improve, Ability to verbalize feelings will improve, Ability to demonstrate self-control will improve, Ability to identify and develop effective coping behaviors will improve and Ability to maintain clinical measurements within normal limits will improve  I certify that inpatient services furnished can reasonably be expected to improve the patient's condition.    Antonieta Pert, MD 5/9/20203:25 PM

## 2019-03-31 NOTE — Tx Team (Signed)
Initial Treatment Plan 03/31/2019 3:41 AM Stephanie [name] ZSW:[id number]    PATIENT STRESSORS: Financial difficulties Health problems Medication change or noncompliance Occupational concerns   PATIENT STRENGTHS: Ability for insight Average or above average intelligence Motivation for treatment/growth   PATIENT IDENTIFIED PROBLEMS:   depression  homeless  psychosis    "learn better coping skill"  "need help with housing"  "find ways to relax"       DISCHARGE CRITERIA:  Ability to meet basic life and health needs Improved stabilization in mood, thinking, and/or behavior Verbal commitment to aftercare and medication compliance  PRELIMINARY DISCHARGE PLAN: Attend aftercare/continuing care group Outpatient therapy Placement in alternative living arrangements  PATIENT/FAMILY INVOLVEMENT: This treatment plan has been presented to and reviewed with the patient, Stephanie [name],.  The patient and family have been given the opportunity to ask questions and make suggestions.  JEHU-APPIAH, Salley Scarlet, RN 03/31/2019, 3:41 AM

## 2019-03-31 NOTE — Progress Notes (Signed)
Patient ID: Stephanie [name], female   DOB: 1976/01/13, 43 y.o.   MRN: [id number] Admission note: Patient is a voluntary admission in no acute distress. Per reports pt was found in a manic episode yelling and screaming. Pt does not appear to be responding to any internal stimuli and answered all of writers questions. Pt reports she is homeless and has been walking a lot for shelter. Pt reports the homeless shelter were not taking people due to the corona virus. Pt was dishelved with strong body odor stating she has not showered in a long time. Pt reports she had a heart attack about 2 months ago and is currently not taking any medications. Pt was calm and cooperative with admission process. Pt denies SI/HI/AVH and pain. Pt admitted to unit per protocol, skin assessment and belonging search done. No skin issues noted. Consent signed by pt. Pt educated on therapeutic milieu rules. Pt was introduced to milieu by nursing staff. Fall risk / suicide safety plan explained to the patient. 15 minutes checks started for safety.

## 2019-03-31 NOTE — [facility] Suicide Risk Assessment (Signed)
[facility]   Nursing information obtained from:  Patient, Review of record Demographic factors:  Unemployed, Low socioeconomic status Current Mental Status:  NA Loss Factors:  Decrease in vocational status, Loss of significant relationship, Decline in physical health Historical Factors:  Family history of mental illness or substance abuse Risk Reduction Factors:  NA  Total Time spent with patient: 30 minutes Principal Problem: <principal problem not specified> Diagnosis:  Active Problems:   Schizoaffective disorder (HCC)  Subjective Data: Patient is seen and examined.  Patient is a 43 year old female with a probable past psychiatric history significant for schizophrenia who presented voluntarily to the emergency department.  The patient had been found on [address] yelling and screaming.  These were obscenities by report.  She stated that she had been burned by chemicals and sprayed in her eyes by chemicals.  The patient had apparently been seen by TDS and expressed delusional thinking.  She believes she had 3 heart attacks on the date of admission.  I am familiar with the patient.  She had been seen in the [facility] emergency department on 03/08/2019.  She has multiple ER visits there, and had been seen there for multiple somatic complaints as well as being homeless and hungry.  She was offered psychiatric hospitalization at the time as well as psychiatric medications but declined those.  It did not appear as though the patient was able to be involuntarily committed at that time.  She does report her last psychiatric hospitalization was sometime in [place] in the past.  She did admit that she had taken Seroquel in the past as well as Risperdal.  "None of it really helped".  "Will it help my heart?".  She is lying on the bed, and pleasant.  She denied any suicidal or homicidal ideation.  She is clearly psychotic and delusional, but pleasant.  At least at this point  we cannot force medications, but I have offered her Haldol and the long-acting Haldol injection as well as Abilify and the long-acting injection.  She was admitted to the hospital for evaluation and stabilization  Continued Clinical Symptoms:  Alcohol Use Disorder Identification Test Final Score (AUDIT): 0 The "Alcohol Use Disorders Identification Test", Guidelines for Use in Primary Care, Second Edition.  World Science writerHealth Organization [facility](WHO). Score between 0-7:  no or low risk or alcohol related problems. Score between 8-15:  moderate risk of alcohol related problems. Score between 16-19:  high risk of alcohol related problems. Score 20 or above:  warrants further diagnostic evaluation for alcohol dependence and treatment.   CLINICAL FACTORS:   Schizophrenia:   Paranoid or undifferentiated type   Musculoskeletal: Strength & Muscle Tone: within normal limits Gait & Station: normal Patient leans: N/A  Psychiatric Specialty Exam: Physical Exam  Nursing note and vitals reviewed. Constitutional: She is oriented to person, place, and time. She appears well-developed and well-nourished.  HENT:  Head: Normocephalic and atraumatic.  Respiratory: Effort normal.  Neurological: She is alert and oriented to person, place, and time.    ROS  Blood pressure 115/76, pulse 80, temperature 98 F (36.7 C), temperature source Oral, resp. rate 18, height 4' 11.84" (1.52 m), weight 64.4 kg, last menstrual period 03/04/2019.Body mass index is 27.88 kg/m.  General Appearance: Disheveled  Eye Contact:  Fair  Speech:  Normal Rate  Volume:  Normal  Mood:  Euthymic  Affect:  Congruent  Thought Process:  Goal Directed and Descriptions of Associations: Circumstantial  Orientation:  Full (Time, Place,  and Person)  Thought Content:  Delusions and Paranoid Ideation  Suicidal Thoughts:  No  Homicidal Thoughts:  No  Memory:  Immediate;   Fair Recent;   Fair Remote;   Fair  Judgement:  Impaired  Insight:   Lacking  Psychomotor Activity:  Normal  Concentration:  Concentration: Fair and Attention Span: Fair  Recall:  [employer] of Knowledge:  Fair  Language:  Good  Akathisia:  Negative  Handed:  Right  AIMS (if indicated):     Assets:  Desire for Improvement Resilience  ADL's:  Impaired  Cognition:  WNL  Sleep:         COGNITIVE FEATURES THAT CONTRIBUTE TO RISK:  None    SUICIDE RISK:   Minimal: No identifiable suicidal ideation.  Patients presenting with no risk factors but with morbid ruminations; may be classified as minimal risk based on the severity of the depressive symptoms  PLAN OF CARE: Patient is seen and examined.  Patient is a 43 year old female with a probable past psychiatric history significant for schizophrenia or schizoaffective disorder.  She was admitted to the hospital, and we will integrate her into the milieu.  She will be encouraged to attend groups.  She has been offered antipsychotic medications at this time, but she is refusing currently.  She is not suicidal or homicidal.  She is not aggressive at this time.  This was basically the way she was when she was seen in the [facility] emergency department.  She does not fulfill involuntary commitment at this time, she does not fulfill forced medications at this time.  Her laboratories were reviewed, and they are essentially normal.  A drug screen was not obtained, and we will attempt to get that done this morning.  I will offer her Risperdal 0.5 mg p.o. daily and 1 mg p.o. nightly, but from review of the electronic medical record and my familiarity with this patient I seriously doubt she will take this medication.  I certify that inpatient services furnished can reasonably be expected to improve the patient's condition.   Antonieta Pert, MD 03/31/2019, 9:45 AM

## 2019-04-01 LAB — RAPID URINE DRUG SCREEN, HOSP PERFORMED
Amphetamines: NOT DETECTED
Barbiturates: NOT DETECTED
Benzodiazepines: NOT DETECTED
Cocaine: NOT DETECTED
Opiates: NOT DETECTED
Tetrahydrocannabinol: NOT DETECTED

## 2019-04-01 NOTE — [facility] Group Notes (Signed)
[facility] LCSW Group Therapy Note  Date/Time:  04/01/2019  11:00AM-12:00PM  Type of Therapy and Topic:  Group Therapy:  Music and Mood  Participation Level:  Did Not Attend   Description of Group: In this process group, members listened to a variety of genres of music and identified that different types of music evoke different responses.  Patients were encouraged to identify music that was soothing for them and music that was energizing for them.  Patients discussed how this knowledge can help with wellness and recovery in various ways including managing depression and anxiety as well as encouraging healthy sleep habits.    Therapeutic Goals: 1. Patients will explore the impact of different varieties of music on mood 2. Patients will verbalize the thoughts they have when listening to different types of music 3. Patients will identify music that is soothing to them as well as music that is energizing to them 4. Patients will discuss how to use this knowledge to assist in maintaining wellness and recovery 5. Patients will explore the use of music as a coping skill  Summary of Patient Progress:  N/A  Therapeutic Modalities: Solution Focused Brief Therapy Activity   Thelton Graca Grossman-Orr, LCSW    

## 2019-04-01 NOTE — Plan of Care (Signed)
Progress note  Pt found in bed; compliant with medication administration. Pt denies any physical pain or symptoms. Pt is still guarded and minimal but pleasant. Pt denies si/hi/ah/vh and verbally agrees to approach staff if these become apparent or before harming herself/others while at [facility]. Pt safe on the unit. Q29m safety checks implemented or continued. Medication provided per protocol and standing orders. Will continue to monitor.  Pt progressing in the following metrics  Problem: Activity: Goal: Sleeping patterns will improve Outcome: Progressing   Problem: Coping: Goal: Ability to demonstrate self-control will improve Outcome: Progressing   Problem: Health Behavior/Discharge Planning: Goal: Compliance with treatment plan for underlying cause of condition will improve Outcome: Progressing   Problem: Safety: Goal: Periods of time without injury will increase Outcome: Progressing

## 2019-04-01 NOTE — Progress Notes (Signed)
[facility] MD Progress Note  04/01/2019 10:26 AM Stephanie [name]  MRN:  [id number] Subjective:  Patient is a 43 year old female with a probable past psychiatric history significant for schizophrenia who presented voluntarily to the emergency department. The patient had been found on [address] yelling and screaming.   Objective: Patient is seen and examined.  Patient is a 43 year old female with a past psychiatric history significant for probable schizophrenia.  She is seen in follow-up.  She has had no problems over the night.  She did take the Haldol yesterday during the day, and also this morning.  She did not take the nighttime dose.  She denied any auditory or visual hallucinations.  She denied any suicidal or homicidal ideation.  Her laboratories came back and they are all essentially normal.  Her vital signs are stable, she is afebrile.  She slept 6.75 hours last night.  Principal Problem: <principal problem not specified> Diagnosis: Active Problems:   Schizoaffective disorder (HCC)  Total Time spent with patient: 15 minutes  Past Psychiatric History: See admission H&P  Past Medical History: History reviewed. No pertinent past medical history. History reviewed. No pertinent surgical history. Family History: History reviewed. No pertinent family history. Family Psychiatric  History: See admission H&P Social History:  Social History   Substance and Sexual Activity  Alcohol Use Never  . Frequency: Never     Social History   Substance and Sexual Activity  Drug Use Never    Social History   Socioeconomic History  . Marital status: Single    Spouse name: Not on file  . Number of children: Not on file  . Years of education: Not on file  . Highest education level: Not on file  Occupational History  . Not on file  Social Needs  . Financial resource strain: Not on file  . Food insecurity:    Worry: Not on file    Inability: Not on file  . Transportation needs:    Medical:  Not on file    Non-medical: Not on file  Tobacco Use  . Smoking status: Never Smoker  . Smokeless tobacco: Never Used  Substance and Sexual Activity  . Alcohol use: Never    Frequency: Never  . Drug use: Never  . Sexual activity: Never  Lifestyle  . Physical activity:    Days per week: Not on file    Minutes per session: Not on file  . Stress: Not on file  Relationships  . Social connections:    Talks on phone: Not on file    Gets together: Not on file    Attends religious service: Not on file    Active member of club or organization: Not on file    Attends meetings of clubs or organizations: Not on file    Relationship status: Not on file  Other Topics Concern  . Not on file  Social History Narrative  . Not on file   Additional Social History:                         Sleep: Good  Appetite:  Good  Current Medications: Current Facility-Administered Medications  Medication Dose Route Frequency Provider Last Rate Last Dose  . acetaminophen (TYLENOL) tablet 650 mg  650 mg Oral Q6H PRN Nira Conn A, NP   650 mg at 03/31/19 1221  . alum & mag hydroxide-simeth (MAALOX/MYLANTA) 200-200-20 MG/5ML suspension 30 mL  30 mL Oral Q4H PRN Jackelyn Poling, NP  [facility]    .  haloperidol (HALDOL) tablet 2 mg  2 mg Oral BID Antonieta Pert, MD   2 mg at 04/01/19 2633  . hydrOXYzine (ATARAX/VISTARIL) tablet 25 mg  25 mg Oral TID PRN Jackelyn Poling, NP      . magnesium hydroxide (MILK OF MAGNESIA) suspension 30 mL  30 mL Oral Daily PRN Nira Conn A, NP      . traZODone (DESYREL) tablet 50 mg  50 mg Oral QHS PRN Jackelyn Poling, NP        Lab Results:  Results for orders placed or performed during the hospital encounter of 03/31/19 (from the past 48 hour(s))  Rapid urine drug screen (hospital performed)     Status: None   Collection Time: 04/01/19  6:34 AM  Result Value Ref Range   Opiates NONE DETECTED NONE DETECTED   Cocaine NONE DETECTED NONE DETECTED   Benzodiazepines NONE  DETECTED NONE DETECTED   Amphetamines NONE DETECTED NONE DETECTED   Tetrahydrocannabinol NONE DETECTED NONE DETECTED   Barbiturates NONE DETECTED NONE DETECTED    Comment: (NOTE) DRUG SCREEN FOR MEDICAL PURPOSES ONLY.  IF CONFIRMATION IS NEEDED FOR ANY PURPOSE, NOTIFY LAB WITHIN 5 DAYS. LOWEST DETECTABLE LIMITS FOR URINE DRUG SCREEN Drug Class                     Cutoff (ng/mL) Amphetamine and metabolites    1000 Barbiturate and metabolites    200 Benzodiazepine                 200 Tricyclics and metabolites     300 Opiates and metabolites        300 Cocaine and metabolites        300 THC                            50 Performed at [facility], [address] [place], [place] [address]     Blood Alcohol level:  Lab Results  Component Value Date   ETH <10 03/08/2019   ETH <10 12/11/2018    Metabolic Disorder Labs: No results found for: HGBA1C, MPG No results found for: PROLACTIN No results found for: CHOL, TRIG, HDL, CHOLHDL, VLDL, LDLCALC  Physical Findings: AIMS: Facial and Oral Movements Muscles of Facial Expression: None, normal Lips and Perioral Area: None, normal Jaw: None, normal Tongue: None, normal,Extremity Movements Upper (arms, wrists, hands, fingers): None, normal Lower (legs, knees, ankles, toes): None, normal, Trunk Movements Neck, shoulders, hips: None, normal, Overall Severity Severity of abnormal movements (highest score from questions above): None, normal Incapacitation due to abnormal movements: None, normal Patient's awareness of abnormal movements (rate only patient's report): No Awareness, Dental Status Current problems with teeth and/or dentures?: No Does patient usually wear dentures?: No  CIWA:    COWS:     Musculoskeletal: Strength & Muscle Tone: within normal limits Gait & Station: normal Patient leans: N/A  Psychiatric Specialty Exam: Physical Exam  Nursing note and vitals reviewed. Constitutional: She is  oriented to person, place, and time. She appears well-developed and well-nourished.  HENT:  Head: Normocephalic and atraumatic.  Respiratory: Effort normal.  Neurological: She is alert and oriented to person, place, and time.    ROS  Blood pressure 123/84, pulse 78, temperature 98.5 F (36.9 C), temperature source Oral, resp. rate 18, height 4' 11.84" (1.52 m), weight 64.4 kg, last menstrual period 03/04/2019.Body mass index is 27.88 kg/m.  General Appearance: Disheveled  Eye Contact:  Fair  Speech:  Slow  Volume:  Decreased  Mood:  Euthymic  Affect:  Congruent  Thought Process:  Goal Directed and Descriptions of Associations: Circumstantial  Orientation:  Full (Time, Place, and Person)  Thought Content:  Delusions  Suicidal Thoughts:  No  Homicidal Thoughts:  No  Memory:  Immediate;   Fair Recent;   Fair Remote;   Fair  Judgement:  Impaired  Insight:  Lacking  Psychomotor Activity:  Decreased  Concentration:  Concentration: Fair and Attention Span: Fair  Recall:  [employer]Fair  Fund of Knowledge:  Fair  Language:  Fair  Akathisia:  Negative  Handed:  Right  AIMS (if indicated):     Assets:  Desire for Improvement Resilience  ADL's:  Impaired  Cognition:  WNL  Sleep:  Number of Hours: 6.75     Treatment Plan Summary: Daily contact with patient to assess and evaluate symptoms and progress in treatment, Medication management and Plan : Patient is seen and examined.  Patient is a 43 year old female with a past psychiatric history significant for schizophrenia who is seen in follow-up.  Diagnosis: #1 schizophrenia  Patient is essentially unchanged but she did take the Haldol yesterday morning as well as this morning.  She is only on 2 mg p.o. twice daily.  No change to this medication.  She has been of no behavioral problems on the unit.  Hopefully we can talk her into the long-acting Haldol injection and have her doing better for at least a little bit more than just a few days. 1.   Continue Haldol 2 mg p.o. twice daily for psychosis. 2.  Continue trazodone 50 mg p.o. nightly as needed insomnia. 3.  Continue hydroxyzine 25 mg p.o. 3 times daily as needed anxiety. 4.  Disposition planning-in progress.  Antonieta PertGreg Lawson Guerline Happ, MD 04/01/2019, 10:26 AM

## 2019-04-01 NOTE — [facility] Group Notes (Signed)
[facility] Group Notes:  (Nursing/MHT/Case Management/Adjunct)  Date:  03/31/2019  Time:  4:00 PM  Type of Therapy:  Nurse Education  Participation Level:  Did Not Attend   Stephanie [name] 04/01/2019, 9:32 AM

## 2019-04-02 MED ORDER — DIPHENHYDRAMINE HCL 50 MG/ML IJ SOLN
INTRAMUSCULAR | Status: AC
  Filled 2019-04-02: qty 1

## 2019-04-02 MED ORDER — HALOPERIDOL 5 MG PO TABS
5.0000 mg | ORAL_TABLET | Freq: Three times a day (TID) | ORAL | Status: DC
  Filled 2019-04-02 (×3): qty 1

## 2019-04-02 MED ORDER — RISPERIDONE 2 MG PO TABS
2.0000 mg | ORAL_TABLET | Freq: Two times a day (BID) | ORAL | Status: DC
  Administered 2019-04-02 – 2019-04-04 (×4): 2 mg via ORAL
  Filled 2019-04-02 (×8): qty 1

## 2019-04-02 MED ORDER — TEMAZEPAM 30 MG PO CAPS
30.0000 mg | ORAL_CAPSULE | Freq: Every day | ORAL | Status: DC
  Filled 2019-04-02: qty 1

## 2019-04-02 MED ORDER — BENZTROPINE MESYLATE 0.5 MG PO TABS
0.5000 mg | ORAL_TABLET | Freq: Two times a day (BID) | ORAL | Status: DC
  Filled 2019-04-02 (×2): qty 1

## 2019-04-02 MED ORDER — DIPHENHYDRAMINE HCL 25 MG PO CAPS
25.0000 mg | ORAL_CAPSULE | Freq: Three times a day (TID) | ORAL | Status: DC
  Administered 2019-04-02 – 2019-04-04 (×6): 25 mg via ORAL
  Filled 2019-04-02 (×12): qty 1

## 2019-04-02 MED ORDER — DIPHENHYDRAMINE HCL 50 MG/ML IJ SOLN
50.0000 mg | Freq: Once | INTRAMUSCULAR | Status: AC
  Administered 2019-04-02: 50 mg via INTRAMUSCULAR
  Filled 2019-04-02: qty 1

## 2019-04-02 NOTE — Progress Notes (Signed)
Recreation Therapy Notes  Date: 5.11.20 Time: 1000 Location: 500 Hall Dayroom  Group Topic: Coping Skills  Goal Area(s) Addresses:  Patient will identify positive coping skills. Patient will identify benefits of using coping skills post d/c.  Behavioral Response:  Engaged  Intervention:  Worksheet, pencils  Activity: Mind map.  LRT filled in the first eight boxes (anger, depression, peer pressure, anxiety, frustration, addiction, OCD/PTSD, and work) with the patients.  Patients were to then come up with three coping skills for each trigger identified.  LRT will write the coping skills on the board the patients were able to come up with.  Education: Pharmacologist, Building control surveyor.   Education Outcome: Acknowledges understanding/In group clarification offered/Needs additional education.   Clinical Observations/Feedback:  Pt was quiet.  Pt completed her sheet.  Pt was observant during group session.    Caroll Rancher, LRT/CTRS     Caroll Rancher A 04/02/2019 11:32 AM

## 2019-04-02 NOTE — Progress Notes (Signed)
Patient has been asleep since shift change, respirations even and no distress noted. Safety maintained on unit with 15 min checks.

## 2019-04-02 NOTE — Progress Notes (Signed)
DAR NOTE: Patient presents with anxious affect and mood.  Denies suicidal thoughts, pain, auditory and visual hallucinations.  Described energy level as high and concentration as good.  Rates depression at 0, hopelessness at 0, and anxiety at 0.  Maintained on routine safety checks.  Medications given as prescribed.  Support and encouragement offered as needed.  Attended group and participated.  States goal for today is "work on going home."  Patient visible in milieu with minimal interaction with staff and peers.  Complain about swollen tongue during lunch time.  Benadryl 50 mg IM given with good effect.  Patient is safe on and off the unit.

## 2019-04-02 NOTE — Tx Team (Signed)
Interdisciplinary Treatment and Diagnostic Plan Update  04/02/2019 Time of Session:  [place] MRN: [id number]  Principal Diagnosis: <principal problem not specified>  Secondary Diagnoses: Active Problems:   Schizoaffective disorder (HCC)   Current Medications:  Current Facility-Administered Medications  Medication Dose Route Frequency Provider Last Rate Last Dose  . diphenhydrAMINE (BENADRYL) 50 MG/ML injection           . acetaminophen (TYLENOL) tablet 650 mg  650 mg Oral Q6H PRN Lindon Romp A, NP   650 mg at 03/31/19 1221  . alum & mag hydroxide-simeth (MAALOX/MYLANTA) 200-200-20 MG/5ML suspension 30 mL  30 mL Oral Q4H PRN Lindon Romp A, NP      . diphenhydrAMINE (BENADRYL) capsule 25 mg  25 mg Oral TID Johnn Hai, MD      . diphenhydrAMINE (BENADRYL) injection 50 mg  50 mg Intramuscular Once Johnn Hai, MD      . hydrOXYzine (ATARAX/VISTARIL) tablet 25 mg  25 mg Oral TID PRN Rozetta Nunnery, NP      . magnesium hydroxide (MILK OF MAGNESIA) suspension 30 mL  30 mL Oral Daily PRN Lindon Romp A, NP      . risperiDONE (RISPERDAL) tablet 2 mg  2 mg Oral BID Johnn Hai, MD      . temazepam (RESTORIL) capsule 30 mg  30 mg Oral QHS Johnn Hai, MD      . traZODone (DESYREL) tablet 50 mg  50 mg Oral QHS PRN Rozetta Nunnery, NP       PTA Medications: No medications prior to admission.    Patient Stressors: Financial difficulties Health problems Medication change or noncompliance Occupational concerns  Patient Strengths: Ability for insight Average or above average intelligence Motivation for treatment/growth  Treatment Modalities: Medication Management, Group therapy, Case management,  1 to 1 session with clinician, Psychoeducation, Recreational therapy.   Physician Treatment Plan for Primary Diagnosis: <principal problem not specified> Long Term Goal(s): Improvement in symptoms so as ready for discharge Improvement in symptoms so as ready for discharge    Short Term Goals: Ability to identify changes in lifestyle to reduce recurrence of condition will improve Ability to verbalize feelings will improve Ability to demonstrate self-control will improve Ability to identify and develop effective coping behaviors will improve Ability to maintain clinical measurements within normal limits will improve Ability to identify changes in lifestyle to reduce recurrence of condition will improve Ability to verbalize feelings will improve Ability to demonstrate self-control will improve Ability to identify and develop effective coping behaviors will improve Ability to maintain clinical measurements within normal limits will improve  Medication Management: Evaluate patient's response, side effects, and tolerance of medication regimen.  Therapeutic Interventions: 1 to 1 sessions, Unit Group sessions and Medication administration.  Evaluation of Outcomes: Not Met  Physician Treatment Plan for Secondary Diagnosis: Active Problems:   Schizoaffective disorder ([place])  Long Term Goal(s): Improvement in symptoms so as ready for discharge Improvement in symptoms so as ready for discharge   Short Term Goals: Ability to identify changes in lifestyle to reduce recurrence of condition will improve Ability to verbalize feelings will improve Ability to demonstrate self-control will improve Ability to identify and develop effective coping behaviors will improve Ability to maintain clinical measurements within normal limits will improve Ability to identify changes in lifestyle to reduce recurrence of condition will improve Ability to verbalize feelings will improve Ability to demonstrate self-control will improve Ability to identify and develop effective coping behaviors will improve Ability to maintain clinical  measurements within normal limits will improve     Medication Management: Evaluate patient's response, side effects, and tolerance of medication  regimen.  Therapeutic Interventions: 1 to 1 sessions, Unit Group sessions and Medication administration.  Evaluation of Outcomes: Not Met   RN Treatment Plan for Primary Diagnosis: <principal problem not specified> Long Term Goal(s): Knowledge of disease and therapeutic regimen to maintain health will improve  Short Term Goals: Ability to identify and develop effective coping behaviors will improve and Compliance with prescribed medications will improve  Medication Management: RN will administer medications as ordered by provider, will assess and evaluate patient's response and provide education to patient for prescribed medication. RN will report any adverse and/or side effects to prescribing provider.  Therapeutic Interventions: 1 on 1 counseling sessions, Psychoeducation, Medication administration, Evaluate responses to treatment, Monitor vital signs and CBGs as ordered, Perform/monitor CIWA, COWS, AIMS and Fall Risk screenings as ordered, Perform wound care treatments as ordered.  Evaluation of Outcomes: Not Met   LCSW Treatment Plan for Primary Diagnosis: <principal problem not specified> Long Term Goal(s): Safe transition to appropriate next level of care at discharge, Engage patient in therapeutic group addressing interpersonal concerns.  Short Term Goals: Engage patient in aftercare planning with referrals and resources, Increase social support and Increase skills for wellness and recovery  Therapeutic Interventions: Assess for all discharge needs, 1 to 1 time with Social worker, Explore available resources and support systems, Assess for adequacy in community support network, Educate family and significant other(s) on suicide prevention, Complete Psychosocial Assessment, Interpersonal group therapy.  Evaluation of Outcomes: Not Met   Progress in Treatment: Attending groups: No. Participating in groups: No. Taking medication as prescribed: No. Toleration medication:  No. Family/Significant other contact made: No, will contact:  when given permission Patient understands diagnosis: No. Discussing patient identified problems/goals with staff: Yes. Medical problems stabilized or resolved: Yes. Denies suicidal/homicidal ideation: Yes. Issues/concerns per patient self-inventory: No. Other: none  New problem(s) identified: No, Describe:  none  New Short Term/Long Term Goal(s):  Patient Goals:  "discharge"  Discharge Plan or Barriers:   Reason for Continuation of Hospitalization: Delusions  Hallucinations Mania Medication stabilization  Estimated Length of Stay: 3-5 days.  Attendees: Patient: Stephanie [name] 04/02/2019 2:49 PM  Physician: Dr. Jake Samples, MD 04/02/2019 2:49 PM  Nursing: Darrol Angel, RN 04/02/2019 2:49 PM  RN Care Manager: 04/02/2019 2:49 PM  Social Worker: Lurline Idol, [place] 04/02/2019 2:49 PM  Recreational Therapist:  04/02/2019 2:49 PM  Other:  04/02/2019 2:49 PM  Other:  04/02/2019 2:49 PM  Other: 04/02/2019 2:49 PM    Scribe for Treatment Team: Joanne Chars, [place] 04/02/2019 2:49 PM

## 2019-04-02 NOTE — Progress Notes (Signed)
[facility] MD Progress Note  04/02/2019 11:50 AM Stephanie [name]  MRN:  [id number] Subjective:    Patient continues to express her fixed delusion that she is suffering from electrical shocks throughout her body on a chronic and intermittent basis she is clearly been under the radar for some time and untreated for chronic period of time and she is homeless with a guards to her schizophrenic/schizoaffective type disorder. Principal Problem: Chronic undertreated psychotic disorder Diagnosis: Active Problems:   Schizoaffective disorder (HCC)  Total Time spent with patient: 20 minutes  Past Medical History: History reviewed. No pertinent past medical history. History reviewed. No pertinent surgical history. Family History: History reviewed. No pertinent family history. Family Psychiatric  History: neg Social History:  Social History   Substance and Sexual Activity  Alcohol Use Never  . Frequency: Never     Social History   Substance and Sexual Activity  Drug Use Never    Social History   Socioeconomic History  . Marital status: Single    Spouse name: Not on file  . Number of children: Not on file  . Years of education: Not on file  . Highest education level: Not on file  Occupational History  . Not on file  Social Needs  . Financial resource strain: Not on file  . Food insecurity:    Worry: Not on file    Inability: Not on file  . Transportation needs:    Medical: Not on file    Non-medical: Not on file  Tobacco Use  . Smoking status: Never Smoker  . Smokeless tobacco: Never Used  Substance and Sexual Activity  . Alcohol use: Never    Frequency: Never  . Drug use: Never  . Sexual activity: Never  Lifestyle  . Physical activity:    Days per week: Not on file    Minutes per session: Not on file  . Stress: Not on file  Relationships  . Social connections:    Talks on phone: Not on file    Gets together: Not on file    Attends religious service: Not on file    Active  member of club or organization: Not on file    Attends meetings of clubs or organizations: Not on file    Relationship status: Not on file  Other Topics Concern  . Not on file  Social History Narrative  . Not on file   Additional Social History:                         Sleep: Good  Appetite:  Good  Current Medications: Current Facility-Administered Medications  Medication Dose Route Frequency Provider Last Rate Last Dose  . acetaminophen (TYLENOL) tablet 650 mg  650 mg Oral Q6H PRN Nira Conn A, NP   650 mg at 03/31/19 1221  . alum & mag hydroxide-simeth (MAALOX/MYLANTA) 200-200-20 MG/5ML suspension 30 mL  30 mL Oral Q4H PRN Nira Conn A, NP      . benztropine (COGENTIN) tablet 0.5 mg  0.5 mg Oral BID Malvin [place], MD      . haloperidol (HALDOL) tablet 5 mg  5 mg Oral TID Malvin [place], MD      . hydrOXYzine (ATARAX/VISTARIL) tablet 25 mg  25 mg Oral TID PRN Nira Conn A, NP      . magnesium hydroxide (MILK OF MAGNESIA) suspension 30 mL  30 mL Oral Daily PRN Nira Conn A, NP      . temazepam (RESTORIL)  capsule 30 mg  30 mg Oral QHS Malvin [place], MD      . traZODone (DESYREL) tablet 50 mg  50 mg Oral QHS PRN Jackelyn Poling, NP        Lab Results:  Results for orders placed or performed during the hospital encounter of 03/31/19 (from the past 48 hour(s))  Rapid urine drug screen (hospital performed)     Status: None   Collection Time: 04/01/19  6:34 AM  Result Value Ref Range   Opiates NONE DETECTED NONE DETECTED   Cocaine NONE DETECTED NONE DETECTED   Benzodiazepines NONE DETECTED NONE DETECTED   Amphetamines NONE DETECTED NONE DETECTED   Tetrahydrocannabinol NONE DETECTED NONE DETECTED   Barbiturates NONE DETECTED NONE DETECTED    Comment: (NOTE) DRUG SCREEN FOR MEDICAL PURPOSES ONLY.  IF CONFIRMATION IS NEEDED FOR ANY PURPOSE, NOTIFY LAB WITHIN 5 DAYS. LOWEST DETECTABLE LIMITS FOR URINE DRUG SCREEN Drug Class                     Cutoff  (ng/mL) Amphetamine and metabolites    1000 Barbiturate and metabolites    200 Benzodiazepine                 200 Tricyclics and metabolites     300 Opiates and metabolites        300 Cocaine and metabolites        300 THC                            50 Performed at [facility], [address], [place], [place] [address]     Blood Alcohol level:  Lab Results  Component Value Date   ETH <10 03/08/2019   ETH <10 12/11/2018    Metabolic Disorder Labs: No results found for: HGBA1C, MPG No results found for: PROLACTIN No results found for: CHOL, TRIG, HDL, CHOLHDL, VLDL, LDLCALC  Physical Findings: AIMS: Facial and Oral Movements Muscles of Facial Expression: None, normal Lips and Perioral Area: None, normal Jaw: None, normal Tongue: None, normal,Extremity Movements Upper (arms, wrists, hands, fingers): None, normal Lower (legs, knees, ankles, toes): None, normal, Trunk Movements Neck, shoulders, hips: None, normal, Overall Severity Severity of abnormal movements (highest score from questions above): None, normal Incapacitation due to abnormal movements: None, normal Patient's awareness of abnormal movements (rate only patient's report): No Awareness, Dental Status Current problems with teeth and/or dentures?: No Does patient usually wear dentures?: No  CIWA:    COWS:     Musculoskeletal: Strength & Muscle Tone: within normal limits Gait & Station: normal Patient leans: N/A  Psychiatric Specialty Exam: Physical Exam  ROS  Blood pressure 133/87, pulse 63, temperature 97.8 F (36.6 C), temperature source Oral, resp. rate 18, height 4' 11.84" (1.52 m), weight 64.4 kg, last menstrual period 03/04/2019.Body mass index is 27.88 kg/m.  General Appearance: Guarded  Eye Contact:  Fair  Speech:  Normal Rate  Volume:  Decreased  Mood:  Anxious and Dysphoric  Affect:  Depressed and Restricted  Thought Process:  Disorganized and Irrelevant  Orientation:   Full (Time, Place, and Person)  Thought Content:  Illogical, Delusions and Hallucinations: Tactile  Suicidal Thoughts:  No  Homicidal Thoughts:  No  Memory:  Immediate;   Poor  Judgement:  Impaired  Insight:  Lacking  Psychomotor Activity:  Normal  Concentration:  Concentration: poor  Recall:  Poor  Fund of Knowledge:  Poor  Language:  Good  Akathisia:  Negative  Handed:  Right  AIMS (if indicated):     Assets:  Resilience Social Support  ADL's:  Intact  Cognition:  WNL  Sleep:  Number of Hours: 5.75     Treatment Plan Summary: Daily contact with patient to assess and evaluate symptoms and progress in treatment, Medication management and Plan Continue reality-based therapy and current measures escalate meds critically Haldol  Sharley Keeler, MD 04/02/2019, 11:50 AM

## 2019-04-02 NOTE — Progress Notes (Signed)
D: Pt denies SI/HI/AVH. Pt is pleasant and cooperative. Pt visible inh dayroom for a little while, pt kept to herself A: Pt was offered support and encouragement.  Pt was encourage to attend groups. Q 15 minute checks were done for safety.  R: safety maintained on unit.

## 2019-04-03 MED ORDER — ARIPIPRAZOLE 2 MG PO TABS
2.0000 mg | ORAL_TABLET | Freq: Once | ORAL | Status: AC
  Administered 2019-04-03: 2 mg via ORAL
  Filled 2019-04-03 (×2): qty 1

## 2019-04-03 NOTE — Progress Notes (Signed)
Recreation Therapy Notes  Date: 5.12.20 Time: 0950 Location: [address] Dayroom  Group Topic: Wellness  Goal Area(s) Addresses:  Patient will define components of whole wellness. Patient will verbalize benefit of whole wellness.  Behavioral Response: Engaged  Intervention:  Music   Activity:  Exercise.  LRT lead the group in a series of stretches.  Patients then took turns leading the group in exercises of their choice.  Patients could take breaks and get water as needed.  Education: Wellness, Building control surveyor.   Education Outcome: Acknowledges education/In group clarification offered/Needs additional education.   Clinical Observations/Feedback:  Pt was active during group session.  Pt was quiet but engaged when prompted.  Pt smiled throughout group.  Pt was able to complete all the exercises presented in group session.    Caroll Rancher, LRT/CTRS     Lillia Abed, Hydie Langan A 04/03/2019 11:02 AM

## 2019-04-03 NOTE — Progress Notes (Signed)
DAR NOTE: Patient presents with calm affect and pleasant mood.  Denies suicidal thoughts, pain, auditory and visual hallucinations.  Described energy level as high and concentration as good.  Rates depression at 0, hopelessness at 0, and anxiety at 0.  Maintained on routine safety checks.  Medications given as prescribed.  Support and encouragement offered as needed.  Attended group and participated.  States goal for today is "release."  Patient visible in milieu with minimal interaction.  Offered no complaint.

## 2019-04-03 NOTE — [facility] Group Notes (Signed)
[facility] LCSW Group Therapy Note  Date/Time: 04/03/2019; 11am  Type of Therapy/Topic:  Group Therapy:  Feelings about Diagnosis  Participation Level:  Minimal   Mood: Pleasant   Description of Group:    This group will allow patients to explore their thoughts and feelings about diagnoses they have received. Patients will be guided to explore their level of understanding and acceptance of these diagnoses. Facilitator will encourage patients to process their thoughts and feelings about the reactions of others to their diagnosis, and will guide patients in identifying ways to discuss their diagnosis with significant others in their lives. This group will be process-oriented, with patients participating in exploration of their own experiences as well as giving and receiving support and challenge from other group members.   Therapeutic Goals: 1. Patient will demonstrate understanding of diagnosis as evidence by identifying two or more symptoms of the disorder:  2. Patient will be able to express two feelings regarding the diagnosis 3. Patient will demonstrate ability to communicate their needs through discussion and/or role plays  Summary of Patient Progress:    Pt was pleasant and engaged throughout the group. Pt did not talk much but she was able to discuss different mental health diagnosis that individuals can experience. Pt observed most of the group and just listened.     Therapeutic Modalities:   Cognitive Behavioral Therapy Brief Therapy Feelings Identification   Stephannie Peters, LCSW
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
## 2019-04-03 NOTE — Progress Notes (Signed)
[facility] MD Progress Note  04/03/2019 10:12 AM Stephanie [name]  MRN:  [id number] Subjective:   Patient states she is better but is only speaking in general terms she continues to have intermittent electrical sensations which is clearly a delusion perhaps a tactile hallucination at any rate she denies auditory or visual hallucinations she denies wanting to harm self or others she openly states she has nowhere else to go and would like to stay hospitalized for prolonged period of time but we will administer long-acting injectable and probable discharge tomorrow Principal Problem: 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 No pertinent past medical history. History reviewed. No pertinent surgical history. Family History: History reviewed. No pertinent family history. Family Psychiatric  History: neg Social History:  Social History   Substance and Sexual Activity  Alcohol Use Never  . Frequency: Never     Social History   Substance and Sexual Activity  Drug Use Never    Social History   Socioeconomic History  . Marital status: Single    Spouse name: Not on file  . Number of children: Not on file  . Years of education: Not on file  . Highest education level: Not on file  Occupational History  . Not on file  Social Needs  . Financial resource strain: Not on file  . Food insecurity:    Worry: Not on file    Inability: Not on file  . Transportation needs:    Medical: Not on file    Non-medical: Not on file  Tobacco Use  . Smoking status: Never Smoker  . Smokeless tobacco: Never Used  Substance and Sexual Activity  . Alcohol use: Never    Frequency: Never  . Drug use: Never  . Sexual activity: Never  Lifestyle  . Physical activity:    Days per week: Not on file    Minutes per session: Not on  file  . Stress: Not on file  Relationships  . Social connections:    Talks on phone: Not on file    Gets together: Not on file    Attends religious service: Not on file    Active member of club or organization: Not on file    Attends meetings of clubs or organizations: Not on file    Relationship status: Not on file  Other Topics Concern  . Not on file  Social History Narrative  . Not on file   Additional Social History:                         Sleep: Fair  Appetite:  Fair  Current Medications: Current Facility-Administered Medications  Medication Dose Route Frequency Provider Last Rate Last Dose  . acetaminophen (TYLENOL) tablet 650 mg  650 mg Oral Q6H PRN Nira Conn A, NP   650 mg at 03/31/19 1221  . alum & mag hydroxide-simeth (MAALOX/MYLANTA) 200-200-20 MG/5ML suspension 30 mL  30 mL Oral Q4H PRN Nira Conn A, NP      . ARIPiprazole (ABILIFY) tablet 2 mg  2 mg Oral Once Malvin Johns, MD      . diphenhydrAMINE (BENADRYL) capsule 25 mg  25 mg Oral TID Malvin Johns, MD   25 mg at 04/03/19 0950  . hydrOXYzine (ATARAX/VISTARIL) tablet 25 mg  25 mg Oral TID PRN Jackelyn Poling, NP      .  magnesium hydroxide (MILK OF MAGNESIA) suspension 30 mL  30 mL Oral Daily PRN Nira ConnBerry, Jason A, NP      . risperiDONE (RISPERDAL) tablet 2 mg  2 mg Oral BID Malvin JohnsFarah, Ladena Jacquez, MD   2 mg at 04/03/19 0949  . temazepam (RESTORIL) capsule 30 mg  30 mg Oral QHS Malvin JohnsFarah, Tytionna Cloyd, MD      . traZODone (DESYREL) tablet 50 mg  50 mg Oral QHS PRN Jackelyn PolingBerry, Jason A, NP        Lab Results: No results found for this or any previous visit (from the past 48 hour(s)).  Blood Alcohol level:  Lab Results  Component Value Date   ETH <10 03/08/2019   ETH <10 12/11/2018    Metabolic Disorder Labs: No results found for: HGBA1C, MPG No results found for: PROLACTIN No results found for: CHOL, TRIG, HDL, CHOLHDL, VLDL, LDLCALC  Physical Findings: AIMS: Facial and Oral Movements Muscles of Facial Expression:  None, normal Lips and Perioral Area: None, normal Jaw: None, normal Tongue: None, normal,Extremity Movements Upper (arms, wrists, hands, fingers): None, normal Lower (legs, knees, ankles, toes): None, normal, Trunk Movements Neck, shoulders, hips: None, normal, Overall Severity Severity of abnormal movements (highest score from questions above): None, normal Incapacitation due to abnormal movements: None, normal Patient's awareness of abnormal movements (rate only patient's report): No Awareness, Dental Status Current problems with teeth and/or dentures?: No Does patient usually wear dentures?: No  CIWA:    COWS:     Musculoskeletal: Strength & Muscle Tone: within normal limits Gait & Station: normal Patient leans: N/A  Psychiatric Specialty Exam: Physical Exam  ROS  Blood pressure 111/77, pulse 67, temperature 97.7 F (36.5 C), temperature source Oral, resp. rate 14, height 4' 11.84" (1.52 m), weight 64.4 kg, last menstrual period 03/04/2019.Body mass index is 27.88 kg/m.  General Appearance: Casual  Eye Contact:  Good  Speech:  Clear and Coherent  Volume:  Normal  Mood:  Dysphoric  Affect:  Appropriate and Congruent  Thought Process:  Irrelevant and Descriptions of Associations: Tangential  Orientation:  Full (Time, Place, and Person)  Thought Content:  Tangential delusional/tactile hallucinations  Suicidal Thoughts:  No  Homicidal Thoughts:  No  Memory:  Immediate;   Fair  Judgement:  Fair  Insight:  Fair  Psychomotor Activity:  Normal  Concentration:  Concentration: Fair  Recall:  FiservFair  Fund of Knowledge:  Fair  Language:  Good  Akathisia:  Negative  Handed:  Right  AIMS (if indicated):     Assets:  Communication Skills Desire for Improvement Leisure Time Physical Health  ADL's:  Intact  Cognition:  WNL  Sleep:  Number of Hours: 6.75     Treatment Plan Summary: Daily contact with patient to assess and evaluate symptoms and progress in treatment,  Medication management and Plan Continue current antipsychotics again administer long-acting injectable probable discharge tomorrow  Malvin JohnsFARAH,Sakib Noguez, MD 04/03/2019, 10:12 AM
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
## 2019-04-04 MED ORDER — ARIPIPRAZOLE ER 400 MG IM SRER: 400.0000 mg | INTRAMUSCULAR | 11 refills | Status: DC

## 2019-04-04 MED ORDER — RISPERIDONE 2 MG PO TABS
4.0000 mg | ORAL_TABLET | Freq: Every day | ORAL | Status: DC
  Filled 2019-04-04: qty 14

## 2019-04-04 MED ORDER — RISPERIDONE 4 MG PO TABS: 4.0000 mg | ORAL_TABLET | Freq: Every day | ORAL | 1 refills | Status: DC

## 2019-04-04 MED ORDER — ARIPIPRAZOLE ER 400 MG IM SRER
400.0000 mg | INTRAMUSCULAR | Status: DC
  Administered 2019-04-04: 400 mg via INTRAMUSCULAR

## 2019-04-04 MED ORDER — BENZTROPINE MESYLATE 1 MG PO TABS: 1.0000 mg | ORAL_TABLET | Freq: Two times a day (BID) | ORAL | 2 refills | Status: DC

## 2019-04-04 MED ORDER — BENZTROPINE MESYLATE 1 MG PO TABS
1.0000 mg | ORAL_TABLET | Freq: Two times a day (BID) | ORAL | Status: DC
  Filled 2019-04-04 (×3): qty 14

## 2019-04-04 NOTE — Plan of Care (Signed)
Pt was able to identify positive coping strategies at completion of recreation therapy group session.   Caroll Rancher, LRT/CTRS

## 2019-04-04 NOTE — Progress Notes (Signed)
Pt currently asleep in bed. Respiration are even and unlabored. Pt in no sign of distress. Will continue to monitor.   

## 2019-04-04 NOTE — [facility] Suicide Risk Assessment (Signed)
[facility] Discharge Suicide Risk Assessment   Principal Problem: Chronic untreated psychotic disorder/homelessness Discharge Diagnoses: Active Problems:   Schizoaffective disorder (HCC)   Total Time spent with patient: 45 minutes   Mental Status Per Nursing Assessment::   On Admission:  NA Current mental status exam-alert oriented fully and cooperative without thoughts of harming self or others, contracting fully, no auditory or visual loose [place] thoughts coherent and goal-directed generally organized in thought and behavior no EPS or TD Demographic Factors:  Low socioeconomic status  Loss Factors: Decrease in vocational status  Historical Factors: NA  Risk Reduction Factors:   Religious beliefs about death  Continued Clinical Symptoms:  Schizophrenia:   Paranoid or undifferentiated type  Cognitive Features That Contribute To Risk:  Loss of executive function    Suicide Risk:  Minimal: No identifiable suicidal ideation.  Patients presenting with no risk factors but with morbid ruminations; may be classified as minimal risk based on the severity of the depressive symptoms    Plan Of Care/Follow-up recommendations:  Activity:  full  Chalmer Zheng, MD 04/04/2019, 8:20 AM

## 2019-04-04 NOTE — Discharge Summary (Signed)
Physician Discharge Summary Note  Patient:  Stephanie [name] is an 43 y.o., female MRN:  [id number] DOB:  06/09/1976 Patient phone:  [phone] (home)  Patient address:   [id number] [place] [id number],  Total Time spent with patient: 20 minutes  Date of Admission:  03/31/2019 Date of Discharge: 04/04/19  Reason for Admission:  Acute psychosis  Principal Problem: Schizoaffective disorder [facility](HCC) Discharge Diagnoses: Principal Problem:   Schizoaffective disorder [facility](HCC)   Past Psychiatric History: It appears as though her last psychiatric inpatient hospitalization was sometime in [place] in the past.  She has multiple emergency room visits.  She is usually calm but delusional in the emergency room's.  She is usually there for somatic complaints, or for food.  It is unclear when the last time she really took any psychiatric medications is.  Past Medical History: History reviewed. No pertinent past medical history. History reviewed. No pertinent surgical history. Family History: History reviewed. No pertinent family history. Family Psychiatric  History: Denies Social History:  Social History   Substance and Sexual Activity  Alcohol Use Never  . Frequency: Never     Social History   Substance and Sexual Activity  Drug Use Never    Social History   Socioeconomic History  . Marital status: Single    Spouse name: Not on file  . Number of children: Not on file  . Years of education: Not on file  . Highest education level: Not on file  Occupational History  . Not on file  Social Needs  . Financial resource strain: Not on file  . Food insecurity:    Worry: Not on file    Inability: Not on file  . Transportation needs:    Medical: Not on file    Non-medical: Not on file  Tobacco Use  . Smoking status: Never Smoker  . Smokeless tobacco: Never Used  Substance and Sexual Activity  . Alcohol use: Never    Frequency: Never  . Drug use: Never  . Sexual activity: Never   Lifestyle  . Physical activity:    Days per week: Not on file    Minutes per session: Not on file  . Stress: Not on file  Relationships  . Social connections:    Talks on phone: Not on file    Gets together: Not on file    Attends religious service: Not on file    Active member of club or organization: Not on file    Attends meetings of clubs or organizations: Not on file    Relationship status: Not on file  Other Topics Concern  . Not on file  Social History Narrative  . Not on file    Hospital Course:   [facility] Assessment 03/31/19: 43 year old female with a probable past psychiatric history significant for schizophrenia who presented voluntarily to the emergency department. The patient had been found on [address] yelling and screaming. These were obscenities by report. She stated that she had been burned by chemicals and sprayed in her eyes by chemicals. The patient had apparently been seen by [facility] and expressed delusional thinking. She believes she had 3 heart attacks on the date of admission. I am familiar with the patient. She had [facility] on [facility]/08/2019. She has multiple ER visits there, and had been seen there for multiple somatic complaints as well as being homeless and hungry. She was offered psychiatric hospitalization at the time as well as psychiatric medications but  declined those. It did not appear as though the patient was able to be involuntarily committed at that time. She does report her last psychiatric hospitalization was sometime in [place] in the past. She did admit that she had taken Seroquel in the past as well as Risperdal. "None of it really helped". "Will it help my heart?".She is lying on the bed, and pleasant. She denied any suicidal or homicidal ideation. She is clearly psychotic and delusional, but pleasant. At least at this point we cannot force medications, but I have offered her Haldol and the  long-acting Haldol injection as well as Abilify and the long-acting injection. She was admitted to the hospital for evaluation and stabilization.  Patient remained on the [facility] unit for 4 days. The patient stabilized on medication and therapy. Patient was discharged on Abilify Maintena 400 mg Q28 days next dose due 05-05-19, Cogentin 1 mg BID, Risperidoe 4 mg QHS, . Patient has shown improvement with improved mood, affect, sleep, appetite, and interaction. Patient has attended group and participated. Patient has been seen in the day room interacting with peers and staff appropriately. Patient denies any SI/HI/AVH and contracts for safety. Patient refuses follow up. Patient is provided with prescriptions for their medications upon discharge.   Physical Findings: AIMS: Facial and Oral Movements Muscles of Facial Expression: None, normal Lips and Perioral Area: None, normal Jaw: None, normal Tongue: None, normal,Extremity Movements Upper (arms, wrists, hands, fingers): None, normal Lower (legs, knees, ankles, toes): None, normal, Trunk Movements Neck, shoulders, hips: None, normal, Overall Severity Severity of abnormal movements (highest score from questions above): None, normal Incapacitation due to abnormal movements: None, normal Patient's awareness of abnormal movements (rate only patient's report): No Awareness, Dental Status Current problems with teeth and/or dentures?: No Does patient usually wear dentures?: No  CIWA:    COWS:     Musculoskeletal: Strength & Muscle Tone: within normal limits Gait & Station: normal Patient leans: N/A  Psychiatric Specialty Exam: Physical Exam  Nursing note and vitals reviewed. Constitutional: She is oriented to person, place, and time. She appears well-developed and well-nourished.  Cardiovascular: Normal rate.  Respiratory: Effort normal.  Musculoskeletal: Normal range of motion.  Neurological: She is alert and oriented to person, place, and time.   Skin: Skin is warm.    Review of Systems  Constitutional: Negative.   HENT: Negative.   Eyes: Negative.   Respiratory: Negative.   Cardiovascular: Negative.   Gastrointestinal: Negative.   Genitourinary: Negative.   Musculoskeletal: Negative.   Skin: Negative.   Neurological: Negative.   Endo/Heme/Allergies: Negative.     Blood pressure 111/77, pulse 67, temperature 97.7 F (36.5 C), temperature source Oral, resp. rate 14, height 4' 11.84" (1.52 m), weight 64.4 kg.Body mass index is 27.88 kg/m.  General Appearance: Casual  Eye Contact:  Good  Speech:  Clear and Coherent and Normal Rate  Volume:  Normal  Mood:  Euthymic  Affect:  Congruent  Thought Process:  Coherent and Descriptions of Associations: Intact  Orientation:  Full (Time, Place, and Person)  Thought Content:  WDL  Suicidal Thoughts:  No  Homicidal Thoughts:  No  Memory:  Immediate;   Good Recent;   Good Remote;   Good  Judgement:  Fair  Insight:  Fair  Psychomotor Activity:  Normal  Concentration:  Concentration: Good and Attention Span: Good  Recall:  Good  Fund of Knowledge:  Good  Language:  Good  Akathisia:  No  Handed:  Right  AIMS (if indicated):  Assets:  Manufacturing systems engineer Desire for Improvement Financial Resources/Insurance Social Support Transportation  ADL's:  Intact  Cognition:  WNL  Sleep:  Number of Hours: 6.75     Have you used any form of tobacco in the last 30 days? (Cigarettes, Smokeless Tobacco, Cigars, and/or Pipes): No  Has this patient used any form of tobacco in the last 30 days? (Cigarettes, Smokeless Tobacco, Cigars, and/or Pipes) Yes, No  Blood Alcohol level:  Lab Results  Component Value Date   ETH <10 03/08/2019   ETH <10 12/11/2018    Metabolic Disorder Labs:  No results found for: HGBA1C, MPG No results found for: PROLACTIN No results found for: CHOL, TRIG, HDL, CHOLHDL, VLDL, LDLCALC  See Psychiatric Specialty Exam and Suicide Risk Assessment  completed by Attending Physician prior to discharge.  Discharge destination:  Home  Is patient on multiple antipsychotic therapies at discharge:  Yes,   Do you recommend tapering to monotherapy for antipsychotics?  Yes   Has Patient had three or more failed trials of antipsychotic monotherapy by history:  No  Recommended Plan for Multiple Antipsychotic Therapies: Taper to monotherapy as described:  at discretion of outpatient provider   Allergies as of 04/04/2019      Reactions   Penicillins Nausea Only   Did it involve swelling of the face/tongue/throat, SOB, or low BP? No Did it involve sudden or severe rash/hives, skin peeling, or any reaction on the inside of your mouth or nose? No Did you need to seek medical attention at a hospital or doctor's office? No When did it last happen?unknown  If all above answers are "NO", may proceed with cephalosporin use.      Medication List    TAKE these medications     Indication  ARIPiprazole ER 400 MG Srer injection Commonly known as:  ABILIFY MAINTENA Inject 2 mLs (400 mg total) into the muscle every 28 (twenty-eight) days. Due approx 6/13  Indication:  MIXED BIPOLAR AFFECTIVE DISORDER, Schizophrenia   benztropine 1 MG tablet Commonly known as:  COGENTIN Take 1 tablet (1 mg total) by mouth 2 (two) times daily.  Indication:  Extrapyramidal Reaction caused by Medications   risperidone 4 MG tablet Commonly known as:  RISPERDAL Take 1 tablet (4 mg total) by mouth at bedtime.  Indication:  Schizophrenia      Follow-up Information    Pt has declined any psychiatric follow up. Follow up.           Follow-up recommendations:  Continue activity as tolerated. Continue diet as recommended by your PCP. Ensure to keep all appointments with outpatient providers.  Comments:  Patient is instructed prior to discharge to: Take all medications as prescribed by his/her mental healthcare provider. Report any adverse effects and or  reactions from the medicines to his/her outpatient provider promptly. Patient has been instructed & cautioned: To not engage in alcohol and or illegal drug use while on prescription medicines. In the event of worsening symptoms, patient is instructed to call the crisis hotline, 911 and or go to the nearest ED for appropriate evaluation and treatment of symptoms. To follow-up with his/her primary care provider for your other medical issues, concerns and or health care needs.    Signed: Gerlene Burdock Kemi Gell, FNP 04/04/2019, 10:57 AM

## 2019-04-04 NOTE — Progress Notes (Signed)
[place] NOVEL CORONAVIRUS (COVID-19) DAILY CHECK-OFF SYMPTOMS - answer yes or no to each - every day NO YES  Have you had a fever in the past 24 hours?  . Fever (Temp > 37.80C / 100F) X   Have you had any of these symptoms in the past 24 hours? . New Cough .  Sore Throat  .  Shortness of Breath .  Difficulty Breathing .  Unexplained Body Aches   X   Have you had any one of these symptoms in the past 24 hours not related to allergies?   . Runny Nose .  Nasal Congestion .  Sneezing   X   If you have had runny nose, nasal congestion, sneezing in the past 24 hours, has it worsened?  X   EXPOSURES - check yes or no X   Have you traveled outside the state in the past 14 days?  X   Have you been in contact with someone with a confirmed diagnosis of COVID-19 or PUI in the past 14 days without wearing appropriate PPE?  X   Have you been living in the same home as a person with confirmed diagnosis of COVID-19 or a PUI (household contact)?    X   Have you been diagnosed with COVID-19?    X              What to do next: Answered NO to all: Answered YES to anything:   Proceed with unit schedule Follow the [facility] Inpatient Flowsheet.   

## 2019-04-04 NOTE — Progress Notes (Signed)
Recreation Therapy Notes  Date: 5.13.20 Time: 1000 Location: 500 Hall Dayroom  Group Topic: Goal Setting  Goal Area(s) Addresses:  Patient will be able to identify at least 3 goals.  Patient will be able to identify obstacles to reaching goals.  Patient will be able to identify what is needed to achieve goals.   Behavioral Response:  Engaged  Intervention:  Worksheet  Activity:  Garment/textile technologist.  Patients were to identify goals they want to accomplish in a week, month, year and 5 years.  Patients were to then identify obstacles to goals, what they need to achieve goals and what they can start doing now to reach goals.  Education:  Discharge Planning, Pharmacologist, Leisure Education   Education Outcome: Acknowledges Education/In Group Clarification Provided/Needs Additional Education  Clinical Observations:  Pt stated in a week she wants to find her own place to stay; month- get license and vehicle back; year- get bills caught up; and in 5 yrs- own her own home.  Pt identified her obstacles as her credit history.  Pt expressed she would need to clean up credit, get a steady job/income.  Pt stated she could start now by doing research.    Caroll Rancher, LRT/CTRS     Lillia Abed, Hara Milholland A 04/04/2019 11:01 AM

## 2019-04-04 NOTE — [facility] Suicide Risk Assessment (Signed)
[facility] INPATIENT:  Family/Significant Other Suicide Prevention Education  Suicide Prevention Education:  Patient Refusal for Family/Significant Other Suicide Prevention Education: The patient [name] has refused to provide written consent for family/significant other to be provided Family/Significant Other Suicide Prevention Education during admission and/or prior to discharge.  Physician notified.  Lorri Frederick, LCSW 04/04/2019, 10:00 AM

## 2019-04-04 NOTE — Progress Notes (Signed)
CSW met with pt to discuss discharge plan.  Pt reports she is homeless and has been homeless for several years.  Pt has been to the [facility] in the past but "got kicked out after a few days" due to an argument.  Pt acknowledged she had been in [place] recently and walked to [place] but without any real plan.  Pt has also been in [place] in the past.  Pt denied that she has any family support that can assist her. CSW talked with her about the shelter situation during the virus pandemic and that there are not available shelter beds at this time.  We discussed the [facility] in [place] as the place that could potentially help her eventually get into the shelter along with other things she may need.  We discussed follow up and pt reports she does not want any psychiatric follow up arranged and does not plan to continue her medication. Winferd Humphrey, MSW, LCSW Clinical Social Worker 04/04/2019 9:54 AM

## 2019-04-04 NOTE — Progress Notes (Signed)
  [facility] Adult Case Management Discharge Plan :  Will you be returning to the same living situation after discharge:  Yes,  Pt is homeless. At discharge, do you have transportation home?: No. Will use bus.  No bus fare due to virus. Do you have the ability to pay for your medications: No. Pt has declined follow up for medications.  Release of information consent forms completed and in the chart;  Patient's signature needed at discharge.  Patient to Follow up at: Follow-up Information    Pt has declined any psychiatric follow up. Follow up.           Next level of care provider has access to [facility] Link:no  Safety Planning and Suicide Prevention discussed: No. Pt declined consent.   Have you used any form of tobacco in the last 30 days? (Cigarettes, Smokeless Tobacco, Cigars, and/or Pipes): No  Has patient been referred to the Quitline?: N/A patient is not a smoker  Patient has been referred for addiction treatment: N/A  Lorri Frederick, LCSW 04/04/2019, 10:00 AM

## 2019-04-11 ENCOUNTER — Encounter: Payer: Self-pay | Admitting: Emergency Medicine

## 2019-04-11 ENCOUNTER — Other Ambulatory Visit: Payer: Self-pay

## 2019-04-11 DIAGNOSIS — R079 Chest pain, unspecified: Secondary | ICD-10-CM | POA: Insufficient documentation

## 2019-04-11 DIAGNOSIS — M79672 Pain in left foot: Secondary | ICD-10-CM | POA: Insufficient documentation

## 2019-04-11 DIAGNOSIS — Z590 Homelessness: Secondary | ICD-10-CM | POA: Insufficient documentation

## 2019-04-11 LAB — URINALYSIS, COMPLETE (UACMP) WITH MICROSCOPIC
Bacteria, UA: NONE SEEN
Bilirubin Urine: NEGATIVE
Glucose, UA: NEGATIVE mg/dL
Ketones, ur: NEGATIVE mg/dL
Nitrite: NEGATIVE
Protein, ur: 30 mg/dL — AB
Specific Gravity, Urine: 1.023 (ref 1.005–1.030)
pH: 5 (ref 5.0–8.0)

## 2019-04-11 LAB — BASIC METABOLIC PANEL
Anion gap: 10 (ref 5–15)
BUN: 18 mg/dL (ref 6–20)
CO2: 27 mmol/L (ref 22–32)
Calcium: 9.3 mg/dL (ref 8.9–10.3)
Chloride: 102 mmol/L (ref 98–111)
Creatinine, Ser: 0.73 mg/dL (ref 0.44–1.00)
GFR calc Af Amer: >60 mL/min (ref >60)
GFR calc non Af Amer: >60 mL/min (ref >60)
Glucose, Bld: 111 mg/dL — ABNORMAL HIGH (ref 70–99)
Potassium: 3.7 mmol/L (ref 3.5–5.1)
Sodium: 139 mmol/L (ref 135–145)

## 2019-04-11 LAB — ETHANOL: Alcohol, Ethyl (B): <10 mg/dL (ref <10)

## 2019-04-11 LAB — CBC
HCT: 38.2 % (ref 36.0–46.0)
Hemoglobin: 11.8 g/dL — ABNORMAL LOW (ref 12.0–15.0)
MCH: 25.6 pg — ABNORMAL LOW (ref 26.0–34.0)
MCHC: 30.9 g/dL (ref 30.0–36.0)
MCV: 82.9 fL (ref 80.0–100.0)
Platelets: 327 10*3/uL (ref 150–400)
RBC: 4.61 MIL/uL (ref 3.87–5.11)
RDW: 14.5 % (ref 11.5–15.5)
WBC: 6.4 10*3/uL (ref 4.0–10.5)
nRBC: 0 % (ref 0.0–0.2)

## 2019-04-11 LAB — URINE DRUG SCREEN, QUALITATIVE (ARMC ONLY)
Amphetamines, Ur Screen: NOT DETECTED
Barbiturates, Ur Screen: NOT DETECTED
Benzodiazepine, Ur Scrn: NOT DETECTED
Cannabinoid 50 Ng, Ur ~~LOC~~: NOT DETECTED
Cocaine Metabolite,Ur ~~LOC~~: NOT DETECTED
MDMA (Ecstasy)Ur Screen: NOT DETECTED
Methadone Scn, Ur: NOT DETECTED
Opiate, Ur Screen: NOT DETECTED
Phencyclidine (PCP) Ur S: NOT DETECTED
Tricyclic, Ur Screen: NOT DETECTED

## 2019-04-11 LAB — ACETAMINOPHEN LEVEL: Acetaminophen (Tylenol), Serum: <10 ug/mL — ABNORMAL LOW (ref 10–30)

## 2019-04-11 LAB — POCT PREGNANCY, URINE: Preg Test, Ur: NEGATIVE

## 2019-04-11 LAB — TROPONIN I: Troponin I: <0.03 ng/mL (ref <0.03)

## 2019-04-11 LAB — SALICYLATE LEVEL: Salicylate Lvl: <7 mg/dL (ref 2.8–30.0)

## 2019-04-11 NOTE — ED Triage Notes (Signed)
Patient to ER via EMS for c/o chest pain, left foot pain, and left sided headache. Patient reports she had "heart attack a couple of days ago". States "they didn't treat me for that though, they treated me for something else". When asked how she knew it was a heart attack (if diagnosed by EKG or blood work), patient states "I don't know, but I had a couple heart attacks in the waiting room while I was waiting, but I didn't say anything.". Patient is currently homeless and has been walking around all day out in the rain. State she is having pain to left foot, particularly all her toes. Denies any injuries.

## 2019-04-11 NOTE — ED Triage Notes (Signed)
Patient presents to the ED via EMS.  Patient walked up to the [employer] drive through and they called 911 for patient.  Patient states she has been walking in the rain all day and is wet and cold.  Patient is also complaining of foot pain.

## 2019-04-12 ENCOUNTER — Emergency Department
Admission: EM | Admit: 2019-04-12 | Discharge: 2019-04-12 | Disposition: A | Discharge status: Home / Self Care | Payer: Self-pay | Attending: Emergency Medicine | Admitting: Emergency Medicine

## 2019-04-12 ENCOUNTER — Emergency Department: Payer: Self-pay

## 2019-04-12 ENCOUNTER — Encounter (HOSPITAL_COMMUNITY): Payer: Self-pay

## 2019-04-12 DIAGNOSIS — M79672 Pain in left foot: Secondary | ICD-10-CM

## 2019-04-12 DIAGNOSIS — R079 Chest pain, unspecified: Secondary | ICD-10-CM

## 2019-04-12 HISTORY — DX: Schizophrenia, unspecified: F20.9

## 2019-04-12 MED ORDER — FOSFOMYCIN TROMETHAMINE 3 G PO PACK
3.0000 g | PACK | Freq: Once | ORAL | Status: AC
  Administered 2019-04-12: 3 g via ORAL

## 2019-04-12 MED ORDER — FOSFOMYCIN TROMETHAMINE 3 G PO PACK
3.0000 g | PACK | Freq: Once | ORAL | Status: DC
  Filled 2019-04-12: qty 3

## 2019-04-12 MED ORDER — IBUPROFEN 600 MG PO TABS
600.0000 mg | ORAL_TABLET | Freq: Once | ORAL | Status: AC
  Administered 2019-04-12: 02:00:00 600 mg via ORAL
  Filled 2019-04-12: qty 1

## 2019-04-12 NOTE — ED Notes (Signed)
Pt uprite on stretcher in exam room with no distress noted; pt reports has been walking a lot and now having pain to left foot/toes; denies any specific injury; also reports has noted her heart beating fast today; Dr Dolores Frame at bedside to examine pt

## 2019-04-12 NOTE — ED Notes (Signed)
X-ray at bedside

## 2019-04-12 NOTE — Discharge Instructions (Addendum)
You may take Tylenol and/or Ibuprofen as needed for discomfort.  Return to the ER for worsening symptoms, persistent vomiting, difficulty breathing or other concerns. °

## 2019-04-12 NOTE — ED Provider Notes (Signed)
[facility] Emergency Department Provider Note   ____________________________________________   First MD Initiated Contact with Patient 04/12/19 [phone]     (approximate)  I have reviewed the triage vital signs and the nursing notes.   HISTORY  Chief Complaint Foot Pain; Homeless; and Chest Pain    HPI Stephanie [name] is a 43 y.o. female brought to the ED via EMS from [employer] with a chief complaint of left foot pain.  Patient is homeless and states she has been walking all day.  Denies fall/injury/trauma.  Also endorses generalized pain from her head to her chest down to her feet.  Reports she "had a heart attack a couple days ago but they did not treat me for that and instead treated me for something else".  Denies fever, cough, shortness of breath, abdominal pain, nausea or vomiting.  Denies recent travel or exposure to persons diagnosed with coronavirus.       Past Medical History:  Diagnosis Date  . Schizophrenia (HCC)     There are no active problems to display for this patient.   Past Surgical History:  Procedure Laterality Date  . INDUCED ABORTION      Prior to Admission medications   Not on File    Allergies Haldol [haloperidol lactate] and Penicillins  No family history on file.  Social History Social History   Tobacco Use  . Smoking status: Never Smoker  . Smokeless tobacco: Never Used  Substance Use Topics  . Alcohol use: Never    Frequency: Never  . Drug use: Not on file    Review of Systems  Constitutional: Positive for generalized pain.  No fever/chills Eyes: No visual changes. ENT: No sore throat. Cardiovascular: Positive for chest pain. Respiratory: Denies shortness of breath. Gastrointestinal: No abdominal pain.  No nausea, no vomiting.  No diarrhea.  No constipation. Genitourinary: Negative for dysuria. Musculoskeletal: Positive for left foot pain.  Negative for back pain. Skin: Negative for rash.  Neurological: Negative for headaches, focal weakness or numbness.   ____________________________________________   PHYSICAL EXAM:  VITAL SIGNS: ED Triage Vitals  Enc Vitals Group     BP 04/11/19 2245 115/62     Pulse Rate 04/11/19 2245 64     Resp 04/11/19 2245 16     Temp 04/11/19 2245 97.7 F (36.5 C)     Temp Source 04/11/19 2245 Oral     SpO2 04/11/19 2245 98 %     Weight 04/11/19 2233 143 lb (64.9 kg)     Height 04/11/19 2233 5\' 1"  (1.549 m)     Head Circumference --      Peak Flow --      Pain Score 04/11/19 2233 7     Pain Loc --      Pain Edu? --      Excl. in GC? --     Constitutional: Alert and oriented.  Disheveled appearing and in no acute distress. Eyes: Conjunctivae are normal. PERRL. EOMI. Head: Atraumatic. Nose: Atraumatic. Mouth/Throat: Mucous membranes are moist.  No dental malocclusion. Neck: No stridor.  No cervical spine tenderness to palpation. Cardiovascular: Normal rate, regular rhythm. Grossly normal heart sounds.  Good peripheral circulation. Respiratory: Normal respiratory effort.  No retractions. Lungs CTAB. Gastrointestinal: Soft and nontender. No distention. No abdominal bruits. No CVA tenderness. Musculoskeletal: Left foot with ungroomed toenails, ashy skin and skin flaking.  No focal tenderness to palpation.  No evidence of frostbite or infection.  2+ distal pulses.  Brisk, less than  5-second capillary refill. Neurologic:  Normal speech and language. No gross focal neurologic deficits are appreciated. No gait instability. Skin:  Skin is warm, dry and intact. No rash noted. Psychiatric: Mood and affect are normal. Speech and behavior are normal.  ____________________________________________   LABS (all labs ordered are listed, but only abnormal results are displayed)  Labs Reviewed  BASIC METABOLIC PANEL - Abnormal; Notable for the following components:      Result Value   Glucose, Bld 111 (*)    All other components within normal  limits  CBC - Abnormal; Notable for the following components:   Hemoglobin 11.8 (*)    MCH 25.6 (*)    All other components within normal limits  ACETAMINOPHEN LEVEL - Abnormal; Notable for the following components:   Acetaminophen (Tylenol), Serum <10 (*)    All other components within normal limits  URINALYSIS, COMPLETE (UACMP) WITH MICROSCOPIC - Abnormal; Notable for the following components:   Color, Urine YELLOW (*)    APPearance HAZY (*)    Hgb urine dipstick SMALL (*)    Protein, ur 30 (*)    Leukocytes,Ua TRACE (*)    All other components within normal limits  TROPONIN I  ETHANOL  SALICYLATE LEVEL  URINE DRUG SCREEN, QUALITATIVE ([facility] ONLY)  POC URINE PREG, ED  POCT PREGNANCY, URINE   ____________________________________________  EKG  ED ECG REPORT I, Avalina Benko J, the attending physician, personally viewed and interpreted this ECG.   Date: 04/12/2019  EKG Time: 2249  Rate: 67  Rhythm: normal EKG, normal sinus rhythm  Axis: Normal  Intervals:none  ST&T Change: Nonspecific  ____________________________________________  RADIOLOGY  ED MD interpretation: Acute cardiopulmonary process; generalized soft tissue swelling left foot  Official radiology report(s): Dg Chest Port 1 View  Result Date: 04/12/2019 CLINICAL DATA:  43 year old homeless female with chest pain. EXAM: PORTABLE CHEST 1 VIEW COMPARISON:  None. FINDINGS: Portable AP upright view at 0223 hours. Low normal lung volumes. Normal cardiac size and mediastinal contours. Visualized tracheal air column is within normal limits. Allowing for portable technique the lungs are clear. No pneumothorax or pleural effusion. Negative visible bowel gas pattern and osseous structures. IMPRESSION: Negative portable chest. Electronically Signed   By: Odessa FlemingH  Hall M.D.   On: 04/12/2019 02:41   Dg Foot Complete Left  Result Date: 04/12/2019 CLINICAL DATA:  43 year old homeless female with pain. EXAM: LEFT FOOT - COMPLETE 3+ VIEW  COMPARISON:  None. FINDINGS: Generalized soft tissue swelling. No soft tissue gas. Bone mineralization is within normal limits. Calcaneus and tarsal bones appear intact and normally aligned. Metatarsals and phalanges appear intact and normally aligned. Normal joint spaces. No acute osseous abnormality identified. IMPRESSION: Generalized soft tissue swelling with no osseous abnormality identified. Electronically Signed   By: Odessa FlemingH  Hall M.D.   On: 04/12/2019 02:43    ____________________________________________   PROCEDURES  Procedure(s) performed (including Critical Care):  Procedures   ____________________________________________   INITIAL IMPRESSION / ASSESSMENT AND PLAN / ED COURSE  As part of my medical decision making, I reviewed the following data within the electronic MEDICAL RECORD NUMBER Nursing notes reviewed and incorporated, Labs reviewed, EKG interpreted, Old chart reviewed, Radiograph reviewed and Notes from prior ED visits     Stephanie ChartersOwetta Zollars was evaluated in Emergency Department on 04/12/2019 for the symptoms described in the history of present illness. She was evaluated in the context of the global COVID-19 pandemic, which necessitated consideration that the patient might be at risk for infection with the SARS-CoV-2 virus that  causes COVID-19. Institutional protocols and algorithms that pertain to the evaluation of patients at risk for COVID-19 are in a state of rapid change based on information released by regulatory bodies including the CDC and federal and state organizations. These policies and algorithms were followed during the patient's care in the ED.   43 year old homeless woman who complains chiefly of left foot pain from walking. States she had a heart attack a couple days ago but was instead treated for something else.  I personally reviewed patient's chart and see that she had an admission for behavioral evaluation at [facility].  There is no record of patient having an MI.   Laboratory results occluding troponin and EKG unremarkable.  Will obtain chest x-ray, x-ray left foot.  Administer fosfomycin packet for mild UTI, Motrin for foot pain.  Clinical Course as of Apr 11 416  Thu Apr 12, 2019  9604 Updated patient on negative imaging results.  Strict return precautions given.  Patient verbalizes understanding and agrees with plan of care.   [JS]    Clinical Course User Index [JS] Irean Hong, MD     ____________________________________________   FINAL CLINICAL IMPRESSION(S) / ED DIAGNOSES  Final diagnoses:  Foot pain, left  Chest pain, unspecified type     ED Discharge Orders    None       Note:  This document was prepared using Dragon voice recognition software and may include unintentional dictation errors.   Irean Hong, MD 04/12/19 [phone]

## 2019-04-21 ENCOUNTER — Emergency Department (HOSPITAL_COMMUNITY)
Admission: EM | Admit: 2019-04-21 | Discharge: 2019-04-21 | Disposition: A | Discharge status: Home / Self Care | Payer: Self-pay | Attending: Emergency Medicine | Admitting: Emergency Medicine

## 2019-04-21 ENCOUNTER — Encounter (HOSPITAL_COMMUNITY): Payer: Self-pay | Admitting: Emergency Medicine

## 2019-04-21 ENCOUNTER — Ambulatory Visit (HOSPITAL_COMMUNITY)
Admission: RE | Admit: 2019-04-21 | Discharge: 2019-04-21 | Disposition: A | Discharge status: Home / Self Care | Payer: No Typology Code available for payment source | Attending: Psychiatry | Admitting: Psychiatry

## 2019-04-21 ENCOUNTER — Ambulatory Visit (HOSPITAL_COMMUNITY)
Admission: AD | Admit: 2019-04-21 | Payer: No Typology Code available for payment source | Source: Home / Self Care | Admitting: Psychiatry

## 2019-04-21 ENCOUNTER — Emergency Department (HOSPITAL_COMMUNITY): Payer: Self-pay

## 2019-04-21 ENCOUNTER — Other Ambulatory Visit: Payer: Self-pay

## 2019-04-21 DIAGNOSIS — R0981 Nasal congestion: Secondary | ICD-10-CM | POA: Insufficient documentation

## 2019-04-21 DIAGNOSIS — J069 Acute upper respiratory infection, unspecified: Secondary | ICD-10-CM | POA: Insufficient documentation

## 2019-04-21 DIAGNOSIS — F209 Schizophrenia, unspecified: Secondary | ICD-10-CM | POA: Insufficient documentation

## 2019-04-21 DIAGNOSIS — R197 Diarrhea, unspecified: Secondary | ICD-10-CM | POA: Insufficient documentation

## 2019-04-21 DIAGNOSIS — R1010 Upper abdominal pain, unspecified: Secondary | ICD-10-CM | POA: Insufficient documentation

## 2019-04-21 DIAGNOSIS — Z79899 Other long term (current) drug therapy: Secondary | ICD-10-CM | POA: Insufficient documentation

## 2019-04-21 DIAGNOSIS — Z590 Homelessness: Secondary | ICD-10-CM | POA: Insufficient documentation

## 2019-04-21 LAB — URINALYSIS, ROUTINE W REFLEX MICROSCOPIC
Bacteria, UA: NONE SEEN
Bilirubin Urine: NEGATIVE
Glucose, UA: NEGATIVE mg/dL
Ketones, ur: NEGATIVE mg/dL
Leukocytes,Ua: NEGATIVE
Nitrite: NEGATIVE
Protein, ur: NEGATIVE mg/dL
Specific Gravity, Urine: 1.013 (ref 1.005–1.030)
pH: 6 (ref 5.0–8.0)

## 2019-04-21 LAB — CBC
HCT: 38.7 % (ref 36.0–46.0)
Hemoglobin: 11.8 g/dL — ABNORMAL LOW (ref 12.0–15.0)
MCH: 26 pg (ref 26.0–34.0)
MCHC: 30.5 g/dL (ref 30.0–36.0)
MCV: 85.4 fL (ref 80.0–100.0)
Platelets: 284 10*3/uL (ref 150–400)
RBC: 4.53 MIL/uL (ref 3.87–5.11)
RDW: 14.7 % (ref 11.5–15.5)
WBC: 4.8 10*3/uL (ref 4.0–10.5)
nRBC: 0 % (ref 0.0–0.2)

## 2019-04-21 LAB — I-STAT BETA HCG BLOOD, ED (MC, WL, AP ONLY): I-stat hCG, quantitative: 9.8 m[IU]/mL — ABNORMAL HIGH (ref <5)

## 2019-04-21 LAB — COMPREHENSIVE METABOLIC PANEL
ALT: 24 U/L (ref 0–44)
AST: 32 U/L (ref 15–41)
Albumin: 4.5 g/dL (ref 3.5–5.0)
Alkaline Phosphatase: 66 U/L (ref 38–126)
Anion gap: 9 (ref 5–15)
BUN: 13 mg/dL (ref 6–20)
CO2: 25 mmol/L (ref 22–32)
Calcium: 9.2 mg/dL (ref 8.9–10.3)
Chloride: 104 mmol/L (ref 98–111)
Creatinine, Ser: 0.73 mg/dL (ref 0.44–1.00)
GFR calc Af Amer: >60 mL/min (ref >60)
GFR calc non Af Amer: >60 mL/min (ref >60)
Glucose, Bld: 72 mg/dL (ref 70–99)
Potassium: 3.9 mmol/L (ref 3.5–5.1)
Sodium: 138 mmol/L (ref 135–145)
Total Bilirubin: 0.3 mg/dL (ref 0.3–1.2)
Total Protein: 8.3 g/dL — ABNORMAL HIGH (ref 6.5–8.1)

## 2019-04-21 LAB — LIPASE, BLOOD: Lipase: 32 U/L (ref 11–51)

## 2019-04-21 MED ORDER — SODIUM CHLORIDE 0.9 % IV BOLUS (SEPSIS)
1000.0000 mL | Freq: Once | INTRAVENOUS | Status: AC
  Administered 2019-04-21: 17:00:00 1000 mL via INTRAVENOUS

## 2019-04-21 MED ORDER — SODIUM CHLORIDE 0.9 % IV SOLN
1000.0000 mL | INTRAVENOUS | Status: DC
  Administered 2019-04-21: 1000 mL via INTRAVENOUS

## 2019-04-21 MED ORDER — ONDANSETRON HCL 4 MG/2ML IJ SOLN
4.0000 mg | Freq: Once | INTRAMUSCULAR | Status: AC
  Administered 2019-04-21: 4 mg via INTRAVENOUS
  Filled 2019-04-21: qty 2

## 2019-04-21 NOTE — ED Notes (Signed)
Beta HCG was given to RN and MD.
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
## 2019-04-21 NOTE — ED Provider Notes (Signed)
[facility]-EMERGENCY DEPT Provider Note   CSN: [id number] Arrival date & time: 04/21/19  1511    History   Chief Complaint Chief Complaint  Patient presents with  . Cough  . Facial Pain  . Abdominal Pain    HPI Stephanie [name] is a 43 y.o. female.     HPI Pt was recently admitted to [facility] hospital from 5/9 to 5/13.  Patient was also seen in the emergency room another time this month for foot pain.  Patient presents today with various complaints.  Patient feels like she might have the flu.  She has felt warm but has not measured a fever.  She has been coughing.  She has some sinus congestion she has some pain in her upper abdomen.  Had some loose stools but denies any vomiting.  Past Medical History:  Diagnosis Date  . Schizophrenia [facility])     Patient Active Problem List   Diagnosis Date Noted  . Schizoaffective disorder (HCC) 03/31/2019    Past Surgical History:  Procedure Laterality Date  . INDUCED ABORTION       OB History   No obstetric history on file.      Home Medications    Prior to Admission medications   Medication Sig Start Date End Date Taking? Authorizing Provider  ARIPiprazole ER (ABILIFY MAINTENA) 400 MG SRER injection Inject 2 mLs (400 mg total) into the muscle every 28 (twenty-eight) days. Due approx 6/13 04/04/19   Malvin Johns, MD  benztropine (COGENTIN) 1 MG tablet Take 1 tablet (1 mg total) by mouth 2 (two) times daily. 04/04/19 04/03/20  Malvin Johns, MD  risperiDONE (RISPERDAL) 4 MG tablet Take 1 tablet (4 mg total) by mouth at bedtime. 04/04/19   Malvin Johns, MD    Family History No family history on file.  Social History Social History   Tobacco Use  . Smoking status: Never Smoker  . Smokeless tobacco: Never Used  Substance Use Topics  . Alcohol use: Never    Frequency: Never  . Drug use: Never     Allergies   Haldol [haloperidol lactate]; Penicillins; and Penicillins   Review of Systems Review of  Systems  All other systems reviewed and are negative.    Physical Exam Updated Vital Signs BP (!) 146/96   Pulse 86   Temp 98.1 F (36.7 C) (Oral)   Resp 17   LMP  (LMP Unknown) Comment: "Last month some time"  SpO2 100%   Physical Exam Vitals signs and nursing note reviewed.  Constitutional:      General: She is not in acute distress.    Appearance: She is well-developed.  HENT:     Head: Normocephalic and atraumatic.     Right Ear: External ear normal.     Left Ear: External ear normal.  Eyes:     General: No scleral icterus.       Right eye: No discharge.        Left eye: No discharge.     Conjunctiva/sclera: Conjunctivae normal.  Neck:     Musculoskeletal: Neck supple.     Trachea: No tracheal deviation.  Cardiovascular:     Rate and Rhythm: Normal rate and regular rhythm.  Pulmonary:     Effort: Pulmonary effort is normal. No respiratory distress.     Breath sounds: Normal breath sounds. No stridor. No wheezing or rales.  Abdominal:     General: Bowel sounds are normal. There is no distension.  Palpations: Abdomen is soft.     Tenderness: There is no abdominal tenderness. There is no guarding or rebound.  Musculoskeletal:        General: No tenderness.  Skin:    General: Skin is warm and dry.     Findings: No rash.  Neurological:     Mental Status: She is alert.     Cranial Nerves: No cranial nerve deficit (no facial droop, extraocular movements intact, no slurred speech).     Sensory: No sensory deficit.     Motor: No abnormal muscle tone or seizure activity.     Coordination: Coordination normal.      ED Treatments / Results  Labs (all labs ordered are listed, but only abnormal results are displayed) Labs Reviewed  COMPREHENSIVE METABOLIC PANEL - Abnormal; Notable for the following components:      Result Value   Total Protein 8.3 (*)    All other components within normal limits  CBC - Abnormal; Notable for the following components:    Hemoglobin 11.8 (*)    All other components within normal limits  URINALYSIS, ROUTINE W REFLEX MICROSCOPIC - Abnormal; Notable for the following components:   Hgb urine dipstick SMALL (*)    All other components within normal limits  I-STAT BETA HCG BLOOD, ED ([facility], [facility], [facility] ONLY) - Abnormal; Notable for the following components:   I-stat hCG, quantitative 9.8 (*)    All other components within normal limits  LIPASE, BLOOD     Radiology Dg Chest Portable 1 View  Result Date: 04/21/2019 CLINICAL DATA:  Productive cough EXAM: PORTABLE CHEST 1 VIEW COMPARISON:  None. FINDINGS: The heart size and mediastinal contours are within normal limits. Both lungs are clear. The visualized skeletal structures are unremarkable. IMPRESSION: No active disease. Electronically Signed   By: Judie Petit.  Shick M.D.   On: 04/21/2019 18:03    Procedures Procedures (including critical care time)  Medications Ordered in ED Medications  sodium chloride 0.9 % bolus 1,000 mL (1,000 mLs Intravenous New Bag/Given 04/21/19 1710)    Followed by  0.9 %  sodium chloride infusion (1,000 mLs Intravenous New Bag/Given 04/21/19 1710)  ondansetron (ZOFRAN) injection 4 mg (4 mg Intravenous Given 04/21/19 1716)     Initial Impression / Assessment and Plan / ED Course  I have reviewed the triage vital signs and the nursing notes.  Pertinent labs & imaging results that were available during my care of the patient were reviewed by me and considered in my medical decision making (see chart for details).  Clinical Course as of Apr 20 1908  Sat Apr 21, 2019  1736 Labs reviewed.  hCG of 9.8 noted.  This is not clinically significant.  I doubt pregnancy.   [JK]    Clinical Course User Index [JK] Linwood Dibbles, MD      Stephanie [name] was evaluated in Emergency Department on 04/21/2019 for the symptoms described in the history of present illness. She was evaluated in the context of the global COVID-19 pandemic, which necessitated  consideration that the patient might be at risk for infection with the SARS-CoV-2 virus that causes COVID-19. Institutional protocols and algorithms that pertain to the evaluation of patients at risk for COVID-19 are in a state of rapid change based on information released by regulatory bodies including the CDC and federal and state organizations. These policies and algorithms were followed during the patient's care in the ED.  Overall, low suspicion for covid.  No fever, no dyspnea, no significant  cough.  ED workup reassuring.  CXR and labs negative.  At this time there does not appear to be any evidence of an acute emergency medical condition and the patient appears stable for discharge with appropriate outpatient follow up.   Final Clinical Impressions(s) / ED Diagnoses   Final diagnoses:  Upper respiratory tract infection, unspecified type    ED Discharge Orders    None       Linwood DibblesKnapp, Jamicheal Heard, MD 04/21/19 Izell [id number]

## 2019-04-21 NOTE — Discharge Instructions (Addendum)
Drink plenty of fluids, rest, take Tylenol as needed, monitor for fever, shortness of breath or other concerning symptoms
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
## 2019-04-21 NOTE — ED Triage Notes (Signed)
Pt reports that she was just discharged from [facility] while ago and she hasnt been felling well these past couple days. Has productive cough in mornings with sinus problems, "electrical pulses in her head" and abd pains.

## 2019-04-30 ENCOUNTER — Encounter (HOSPITAL_COMMUNITY): Payer: Self-pay

## 2019-04-30 DIAGNOSIS — Z79899 Other long term (current) drug therapy: Secondary | ICD-10-CM | POA: Insufficient documentation

## 2019-04-30 DIAGNOSIS — R51 Headache: Secondary | ICD-10-CM | POA: Insufficient documentation
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
## 2019-05-01 ENCOUNTER — Emergency Department (HOSPITAL_COMMUNITY)
Admission: EM | Admit: 2019-05-01 | Discharge: 2019-05-01 | Disposition: A | Discharge status: Home / Self Care | Payer: Self-pay | Attending: Emergency Medicine | Admitting: Emergency Medicine

## 2019-05-01 DIAGNOSIS — R519 Headache, unspecified: Secondary | ICD-10-CM

## 2019-05-01 LAB — POC URINE PREG, ED: Preg Test, Ur: NEGATIVE

## 2019-05-01 MED ORDER — METOCLOPRAMIDE HCL 5 MG/ML IJ SOLN
10.0000 mg | Freq: Once | INTRAMUSCULAR | Status: AC
  Administered 2019-05-01: 10 mg via INTRAVENOUS
  Filled 2019-05-01: qty 2

## 2019-05-01 MED ORDER — KETOROLAC TROMETHAMINE 15 MG/ML IJ SOLN
15.0000 mg | Freq: Once | INTRAMUSCULAR | Status: AC
  Administered 2019-05-01: 15 mg via INTRAVENOUS
  Filled 2019-05-01: qty 1

## 2019-05-01 NOTE — Discharge Instructions (Addendum)
You have been seen today for a headache. Please read and follow all provided instructions.   1. Medications: tylenol/ibuprofen for headaches, usual home medications 2. Treatment: rest, drink plenty of fluids 3. Follow Up: Please follow up with your primary doctor in 2-5 days for discussion of your diagnoses and further evaluation after today's visit; if you do not have a primary care doctor use the resource guide provided to find one; Please return to the ER for any new or worsening symptoms. Please obtain all of your results from medical records or have your doctors office obtain the results - share them with your doctor - you should be seen at your doctors office. Call today to arrange your follow up.   Take medications as prescribed. Please review all of the medicines and only take them if you do not have an allergy to them. Return to the emergency room for worsening condition or new concerning symptoms. Follow up with your regular doctor. If you don't have a regular doctor use one of the numbers below to establish a primary care doctor. ?  It is also a possibility that you have an allergic reaction to any of the medicines that you have been prescribed - Everybody reacts differently to medications and while MOST people have no trouble with most medicines, you may have a reaction such as nausea, vomiting, rash, swelling, shortness of breath. If this is the case, please stop taking the medicine immediately and contact your physician.  ?  You should return to the ER if you develop severe or worsening symptoms.   Emergency Department Resource Guide 1) Find a Doctor and Pay Out of Pocket Although you won't have to find out who is covered by your insurance plan, it is a good idea to ask around and get recommendations. You will then need to call the office and see if the doctor you have chosen will accept you as a new patient and what types of options they offer for patients who are self-pay. Some doctors  offer discounts or will set up payment plans for their patients who do not have insurance, but you will need to ask so you aren't surprised when you get to your appointment.  2) Contact Your Local Health Department Not all health departments have doctors that can see patients for sick visits, but many do, so it is worth a call to see if yours does. If you don't know where your local health department is, you can check in your phone book. The CDC also has a tool to help you locate your state's health department, and many state websites also have listings of all of their local health departments.  3) Find a Walk-in Clinic If your illness is not likely to be very severe or complicated, you may want to try a walk in clinic. These are popping up all over the country in pharmacies, drugstores, and shopping centers. They're usually staffed by nurse practitioners or physician assistants that have been trained to treat common illnesses and complaints. They're usually fairly quick and inexpensive. However, if you have serious medical issues or chronic medical problems, these are probably not your best option.  No Primary Care Doctor: Call Health Connect at  [phone] - they can help you locate a primary care doctor that  accepts your insurance, provides certain services, etc. Physician Referral Service- [phone]  Chronic Pain Problems: Organization         Address  Phone   Notes  [facility]  [phone] Patients need to be referred by their primary care doctor.   Medication Assistance: Organization         Address  Phone   Notes  [facility] Medication [facility] [address] [place], [place] [address] [phone] --Must be a resident of [facility] -- Must have NO insurance coverage whatsoever (no Medicaid/ Medicare, etc.) -- The pt. MUST have a primary care doctor that directs their care regularly and follows them in the community    [facility]  [phone]   [employer]  [phone]    Agencies that provide inexpensive medical care: Organization         Address  Phone   Notes  [place]  [phone]   [facility]    [phone]   [facility] [place], [place] [address] [phone]   [place] [address] [place] [phone]   [facility]    [phone]   [facility]    [phone]   [place] and [place], [place] Phone:  [phone], Fax:  [phone] Hours of Operation:  9 am - 6 pm, M-F.  Also accepts Medicaid/Medicare and self-pay.  [facility] for [address], [address], [place] Phone: [phone], Fax: [phone]. Hours of Operation:  8:30 am - 5:30 pm, M-F.  Also accepts Medicaid and self-pay.  [facility] [place] [address], [place] Phone: [phone]   [place], [place], [place] [phone], Ext. [phone] Mondays & Thursdays: 7-9 AM.  First 15 patients are seen on a first come, first serve basis.    [facility] Providers:  Organization         Address  Phone   Notes  [facility] [address], [place] [phone] Also accepts self-pay patients.  [facility] [address], [place]  [phone]   [place], [address], [place] [phone]   [facility] [address] [place] [phone]   Lucianne Lei [address], [place]   [phone] Only accepts [place] patients after they have their name applied to their card.   Self-Pay (no insurance) in [facility]:  Organization         Address  Phone   Notes  Sickle Cell Patients, [facility] [place] [phone]   [facility] [place] [phone]   [facility] [place]  [place], [place], [place] [phone]   [facility]/Dr. Osei-Bonsu  [address], [place] or [address], [place] [phone] Phone number for both [place] and [place] locations is the same.  [facility], [place] [address] [place] [phone]   [facility] [address], [place] or [address] [phone]   [facility] [address] [place] [phone], phone; [phone], fax Sees patients 1st and 3rd Saturday of every month.  Must not qualify for public or private insurance (i.e. Medicaid, Medicare, [place] Health Choice,  Veterans' Benefits)  Household income should be no more than 200% of the poverty level The clinic cannot treat you if you are pregnant or think you are pregnant  Sexually transmitted diseases are not treated at the clinic.

## 2019-05-01 NOTE — ED Provider Notes (Signed)
[place] DEPT Provider Note   CSN: [id number] Arrival date & time: 04/30/19  1932    History   Chief Complaint Chief Complaint  Patient presents with  . Headache    HPI Stephanie [name] is a 43 y.o. female with a PMH of Schizophrenia presenting with a constant diffuse non radiating headache onset at 6pm yesterday. Patient describes headache as, "A throbbing and shock sensation." Patient reports she was in her hotel room when symptoms started. Patient reports she has had similar headaches since 2015. Patient denies taking any medications prior to arrival. Patient states she has been compliant with her psychiatric medications. Patient denies HI, SI, or hallucinations. Patient denies vision changes, syncope, dizziness, or lightheadedness. Patient denies trauma, fever, chills, cough, congestion, nausea, vomiting, abdominal pain, neck pain, or rash. Patient denies sick contacts or recent travel.     HPI  Past Medical History:  Diagnosis Date  . Schizophrenia [facility])     Patient Active Problem List   Diagnosis Date Noted  . Schizoaffective disorder ([place]) 03/31/2019    Past Surgical History:  Procedure Laterality Date  . INDUCED ABORTION       OB History   No obstetric history on file.      Home Medications    Prior to Admission medications   Medication Sig Start Date End Date Taking? Authorizing Provider  ARIPiprazole ER (ABILIFY MAINTENA) 400 MG SRER injection Inject 2 mLs (400 mg total) into the muscle every 28 (twenty-eight) days. Due approx 6/13 04/04/19   Johnn Hai, MD  benztropine (COGENTIN) 1 MG tablet Take 1 tablet (1 mg total) by mouth 2 (two) times daily. 04/04/19 04/03/20  Johnn Hai, MD  risperiDONE (RISPERDAL) 4 MG tablet Take 1 tablet (4 mg total) by mouth at bedtime. 04/04/19   Johnn Hai, MD    Family History History reviewed. No pertinent family history.  Social History Social History   Tobacco Use  . Smoking  status: Never Smoker  . Smokeless tobacco: Never Used  Substance Use Topics  . Alcohol use: Never    Frequency: Never  . Drug use: Never     Allergies   Haldol [haloperidol lactate]; Penicillins; and Penicillins   Review of Systems Review of Systems  Constitutional: Negative for activity change, appetite change, chills, diaphoresis, fatigue, fever and unexpected weight change.  HENT: Negative for congestion, ear pain, sinus pressure, sinus pain and sore throat.   Eyes: Negative for photophobia and visual disturbance.  Respiratory: Negative for shortness of breath.   Cardiovascular: Negative for chest pain.  Gastrointestinal: Negative for abdominal pain, nausea and vomiting.  Musculoskeletal: Negative for gait problem, myalgias, neck pain and neck stiffness.  Skin: Negative for rash.  Allergic/Immunologic: Negative for immunocompromised state.  Neurological: Positive for headaches. Negative for dizziness, tremors, seizures, syncope, facial asymmetry, speech difficulty, weakness, light-headedness and numbness.  Hematological: Does not bruise/bleed easily.  Psychiatric/Behavioral: Negative for confusion.     Physical Exam Updated Vital Signs BP (!) 152/72 (BP Location: Left Arm)   Pulse 64   Temp 98.6 F (37 C) (Oral)   Resp 20   Ht 5\' 1"  (1.549 m)   Wt 65.8 kg   LMP  (LMP Unknown) Comment: "Last month some time"  SpO2 100%   BMI 27.40 kg/m   Physical Exam Vitals signs and nursing note reviewed.  Constitutional:      General: She is not in acute distress.    Appearance: She is well-developed. She is not diaphoretic.  Comments: Patient is sitting on exam bed in no acute distress.  HENT:     Head: Normocephalic and atraumatic.  Eyes:     General: No scleral icterus.    Extraocular Movements: Extraocular movements intact.     Right eye: Normal extraocular motion and no nystagmus.     Left eye: Normal extraocular motion and no nystagmus.     Pupils: Pupils are  equal, round, and reactive to light. Pupils are equal.  Neck:     Musculoskeletal: Normal range of motion.  Cardiovascular:     Rate and Rhythm: Normal rate and regular rhythm.     Heart sounds: Normal heart sounds. No murmur. No friction rub. No gallop.   Pulmonary:     Effort: Pulmonary effort is normal. No respiratory distress.     Breath sounds: Normal breath sounds. No wheezing or rales.  Abdominal:     Palpations: Abdomen is soft.     Tenderness: There is no abdominal tenderness.  Musculoskeletal: Normal range of motion.  Skin:    General: Skin is warm.     Findings: No erythema or rash.  Neurological:     Mental Status: She is alert and oriented to person, place, and time.    Mental Status:  Alert, oriented, thought content appropriate, able to give a coherent history. Speech fluent without evidence of aphasia. Able to follow 2 step commands without difficulty.  Cranial Nerves:  II:  Peripheral visual fields grossly normal, pupils equal, round, reactive to light III,IV, VI: ptosis not present, extra-ocular motions intact bilaterally  V,VII: smile symmetric, facial light touch sensation equal VIII: hearing grossly normal to voice  IX,X: symmetric elevation of soft palate, uvula elevates symmetrically  XI: bilateral shoulder shrug symmetric and strong XII: midline tongue extension without fassiculations Motor:  Normal tone. 5/5 in upper and lower extremities bilaterally including strong and equal grip strength and dorsiflexion/plantar flexion Sensory: light touch normal in all extremities.  Deep Tendon Reflexes: 2+ and symmetric in the biceps and patella Cerebellar: normal finger-to-nose with bilateral upper extremities Gait: normal gait and balance.  Negative pronator drift. Negative Romberg sign. CV: distal pulses palpable throughout   ED Treatments / Results  Labs (all labs ordered are listed, but only abnormal results are displayed) Labs Reviewed  POC URINE PREG,  ED    EKG None  Radiology No results found.  Procedures Procedures (including critical care time)  Medications Ordered in ED Medications  ketorolac (TORADOL) 15 MG/ML injection 15 mg (15 mg Intravenous Given 05/01/19 0112)  metoCLOPramide (REGLAN) injection 10 mg (10 mg Intravenous Given 05/01/19 0112)     Initial Impression / Assessment and Plan / ED Course  I have reviewed the triage vital signs and the nursing notes.  Pertinent labs & imaging results that were available during my care of the patient were reviewed by me and considered in my medical decision making (see chart for details).  Clinical Course as of Apr 30 122  Tue May 01, 2019  0122 Patient reports headache has improved while in the ER. Patient is comfortable with being discharged at this time.   [AH]    Clinical Course User Index [AH] Carlyle BasquesHernandez, Ece Cumberland P, PA-C      Pt HA treated and improved while in ED.  Presentation is like pts typical HA and non concerning [facility], Meningitis, or temporal arteritis. Pt is afebrile with no focal neuro deficits, nuchal rigidity, or change in vision. Discussed with patient risks and benefits of  head CT and patient agrees that head CT is not required at this time since headaches are similar to previous headaches and she has a normal neurological exam. Pt is to follow up with PCP to discuss prophylactic medication. Discussed return precautions with patient. Pt verbalizes understanding and is agreeable with plan to dc.   Final Clinical Impressions(s) / ED Diagnoses   Final diagnoses:  Bad headache    ED Discharge Orders    None       Leretha DykesHernandez, Mariaha Ellington P, [place] JerseyPA-C 05/01/19 0124    Dione BoozeGlick, David, MD 05/01/19 [phone]

## 2019-05-01 NOTE — ED Notes (Signed)
Bed: WTR5 Expected date:  Expected time:  Means of arrival:  Comments: 

## 2019-05-18 ENCOUNTER — Other Ambulatory Visit: Payer: Self-pay | Admitting: *Deleted

## 2019-05-18 DIAGNOSIS — Z20822 Contact with and (suspected) exposure to covid-19: Secondary | ICD-10-CM

## 2019-05-24 LAB — NOVEL CORONAVIRUS, NAA: SARS-CoV-2, NAA: NOT DETECTED

## 2019-05-24 LAB — SPECIMEN STATUS REPORT

## 2019-06-29 IMAGING — DX PORTABLE CHEST - 1 VIEW
1 series · 1 of 1 positions shown · non-contrast
Comparison: None.

CLINICAL DATA: Productive cough

EXAM:
PORTABLE CHEST 1 VIEW

[chest ap]
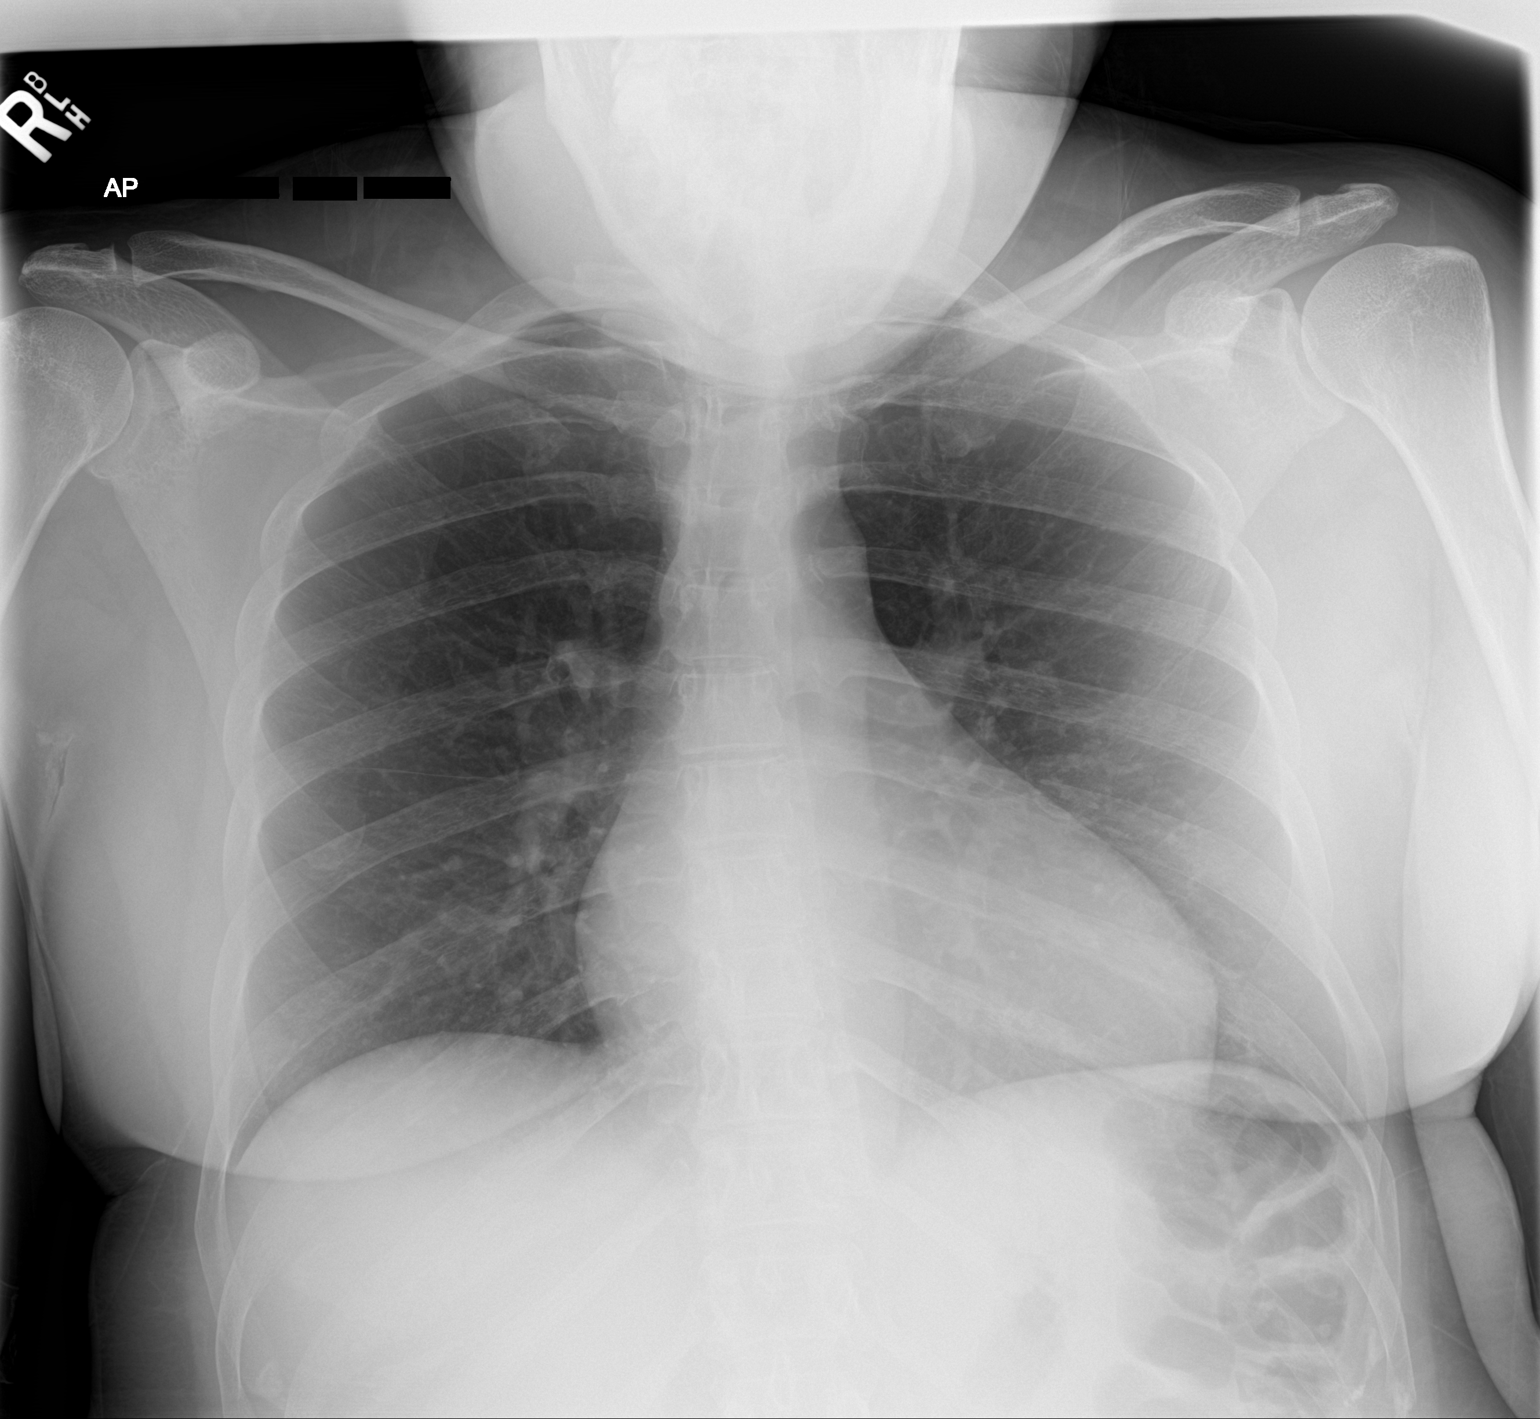

[1 of 1 positions shown; findings below may reference images not displayed]

FINDINGS: The heart size and mediastinal contours are within normal limits.
Both lungs are clear. The visualized skeletal structures are
unremarkable.
IMPRESSION: No active disease.

## 2020-03-14 ENCOUNTER — Emergency Department (HOSPITAL_COMMUNITY)
Admission: EM | Admit: 2020-03-14 | Discharge: 2020-03-14 | Disposition: A | Discharge status: Home / Self Care | Payer: Self-pay | Attending: Emergency Medicine | Admitting: Emergency Medicine

## 2020-03-14 ENCOUNTER — Encounter (HOSPITAL_COMMUNITY): Payer: Self-pay

## 2020-03-14 ENCOUNTER — Other Ambulatory Visit: Payer: Self-pay

## 2020-03-14 DIAGNOSIS — E860 Dehydration: Secondary | ICD-10-CM | POA: Insufficient documentation

## 2020-03-14 DIAGNOSIS — R112 Nausea with vomiting, unspecified: Secondary | ICD-10-CM | POA: Insufficient documentation

## 2020-03-14 DIAGNOSIS — Z20822 Contact with and (suspected) exposure to covid-19: Secondary | ICD-10-CM | POA: Insufficient documentation

## 2020-03-14 DIAGNOSIS — R197 Diarrhea, unspecified: Secondary | ICD-10-CM

## 2020-03-14 LAB — COMPREHENSIVE METABOLIC PANEL
ALT: 18 U/L (ref 0–44)
AST: 25 U/L (ref 15–41)
Albumin: 4.3 g/dL (ref 3.5–5.0)
Alkaline Phosphatase: 77 U/L (ref 38–126)
Anion gap: 17 — ABNORMAL HIGH (ref 5–15)
BUN: 42 mg/dL — ABNORMAL HIGH (ref 6–20)
CO2: 29 mmol/L (ref 22–32)
Calcium: 10 mg/dL (ref 8.9–10.3)
Chloride: 92 mmol/L — ABNORMAL LOW (ref 98–111)
Creatinine, Ser: 2.11 mg/dL — ABNORMAL HIGH (ref 0.44–1.00)
GFR calc Af Amer: 32 mL/min — ABNORMAL LOW (ref >60)
GFR calc non Af Amer: 27 mL/min — ABNORMAL LOW (ref >60)
Glucose, Bld: 184 mg/dL — ABNORMAL HIGH (ref 70–99)
Potassium: 3.3 mmol/L — ABNORMAL LOW (ref 3.5–5.1)
Sodium: 138 mmol/L (ref 135–145)
Total Bilirubin: 0.9 mg/dL (ref 0.3–1.2)
Total Protein: 8.5 g/dL — ABNORMAL HIGH (ref 6.5–8.1)

## 2020-03-14 LAB — CBC WITH DIFFERENTIAL/PLATELET
Abs Immature Granulocytes: 0.01 10*3/uL (ref 0.00–0.07)
Basophils Absolute: 0 10*3/uL (ref 0.0–0.1)
Basophils Relative: 1 %
Eosinophils Absolute: 0 10*3/uL (ref 0.0–0.5)
Eosinophils Relative: 0 %
HCT: 46.1 % — ABNORMAL HIGH (ref 36.0–46.0)
Hemoglobin: 14.5 g/dL (ref 12.0–15.0)
Immature Granulocytes: 0 %
Lymphocytes Relative: 20 %
Lymphs Abs: 0.9 10*3/uL (ref 0.7–4.0)
MCH: 25.8 pg — ABNORMAL LOW (ref 26.0–34.0)
MCHC: 31.5 g/dL (ref 30.0–36.0)
MCV: 82.2 fL (ref 80.0–100.0)
Monocytes Absolute: 0.5 10*3/uL (ref 0.1–1.0)
Monocytes Relative: 12 %
Neutro Abs: 3 10*3/uL (ref 1.7–7.7)
Neutrophils Relative %: 67 %
Platelets: 272 10*3/uL (ref 150–400)
RBC: 5.61 MIL/uL — ABNORMAL HIGH (ref 3.87–5.11)
RDW: 15.2 % (ref 11.5–15.5)
WBC: 4.4 10*3/uL (ref 4.0–10.5)
nRBC: 0 % (ref 0.0–0.2)

## 2020-03-14 LAB — I-STAT BETA HCG BLOOD, ED (MC, WL, AP ONLY): I-stat hCG, quantitative: 6.6 m[IU]/mL — ABNORMAL HIGH (ref <5)

## 2020-03-14 LAB — ETHANOL: Alcohol, Ethyl (B): <10 mg/dL (ref <10)

## 2020-03-14 LAB — RESPIRATORY PANEL BY RT PCR (FLU A&B, COVID)
Influenza A by PCR: NEGATIVE
Influenza B by PCR: NEGATIVE
SARS Coronavirus 2 by RT PCR: NEGATIVE

## 2020-03-14 MED ORDER — ONDANSETRON HCL 4 MG PO TABS
4.0000 mg | ORAL_TABLET | Freq: Three times a day (TID) | ORAL | 0 refills | Status: DC | PRN
Start: 2020-03-14 — End: 2020-03-31

## 2020-03-14 MED ORDER — ONDANSETRON HCL 4 MG/2ML IJ SOLN
4.0000 mg | Freq: Once | INTRAMUSCULAR | Status: AC
  Administered 2020-03-14: 13:00:00 4 mg via INTRAVENOUS
  Filled 2020-03-14: qty 2

## 2020-03-14 MED ORDER — SODIUM CHLORIDE 0.9 % IV BOLUS
1000.0000 mL | Freq: Once | INTRAVENOUS | Status: AC
  Administered 2020-03-14: 1000 mL via INTRAVENOUS

## 2020-03-14 NOTE — Discharge Instructions (Signed)
You are dehydrated.  Please drink plenty of fluid at home.  Take zofran as needed for nausea.  If your condition worsen please return to the ER for further care.

## 2020-03-14 NOTE — ED Provider Notes (Signed)
[facility]-EMERGENCY DEPT Provider Note   CSN: [id number] Arrival date & time: 03/14/20  1112     History Chief Complaint  Patient presents with  . Emesis    Stephanie [name] is a 44 y.o. female.  The history is provided by the patient and medical records. No language interpreter was used.  Emesis    44 year old female with history of schizophrenia brought here via EMS for evaluation of nausea vomiting diarrhea.  Patient report for the past 2 weeks she has had recurrent nausea, vomits at least 2 or 3 times a day as well as having 2 or 3 episodes of loose stools daily.  She states the symptom has been persistent, she has some congestion and occasional cough and she states that she may have the flu.  She denies any fever but does endorse some chills.  No chest pain or shortness of breath no dysuria hematuria.  She have not taken any of her medication.  Patient however denies any SI or HI auditory or visual hallucination.  She denies any recent sick contact.  No specific treatment tried.  She does at home by herself, her case manager had a visit today and encourage patient to seek treatment.  Patient does not complain of any symptoms abdominal pain or body aches.  No loss of taste or smell.  Past Medical History:  Diagnosis Date  . Schizophrenia [facility])     Patient Active Problem List   Diagnosis Date Noted  . Schizoaffective disorder ([facility]) 03/31/2019    Past Surgical History:  Procedure Laterality Date  . INDUCED ABORTION       OB History   No obstetric history on file.     History reviewed. No pertinent family history.  Social History   Tobacco Use  . Smoking status: Never Smoker  . Smokeless tobacco: Never Used  Substance Use Topics  . Alcohol use: Never  . Drug use: Never    Home Medications Prior to Admission medications   Medication Sig Start Date End Date Taking? Authorizing Provider  ARIPiprazole ER (ABILIFY MAINTENA) 400 MG SRER  injection Inject 2 mLs (400 mg total) into the muscle every 28 (twenty-eight) days. Due approx 6/13 04/04/19   Malvin Johns, MD  benztropine (COGENTIN) 1 MG tablet Take 1 tablet (1 mg total) by mouth 2 (two) times daily. 04/04/19 04/03/20  Malvin Johns, MD  risperiDONE (RISPERDAL) 4 MG tablet Take 1 tablet (4 mg total) by mouth at bedtime. 04/04/19   Malvin Johns, MD    Allergies    Haldol [haloperidol lactate], Penicillins, and Penicillins  Review of Systems   Review of Systems  Gastrointestinal: Positive for vomiting.  All other systems reviewed and are negative.   Physical Exam Updated Vital Signs BP 123/74 (BP Location: Left Arm)   Pulse 72   Temp (!) 97.5 F (36.4 C) (Oral)   Resp 18   SpO2 100%   Physical Exam Vitals and nursing note reviewed.  Constitutional:      General: She is not in acute distress.    Appearance: She is well-developed.  HENT:     Head: Atraumatic.     Mouth/Throat:     Mouth: Mucous membranes are moist.  Eyes:     Conjunctiva/sclera: Conjunctivae normal.  Cardiovascular:     Rate and Rhythm: Normal rate and regular rhythm.     Pulses: Normal pulses.     Heart sounds: Normal heart sounds.  Pulmonary:     Effort: Pulmonary  effort is normal.     Breath sounds: Normal breath sounds.  Abdominal:     Palpations: Abdomen is soft.     Tenderness: There is no abdominal tenderness.  Musculoskeletal:     Cervical back: Neck supple.  Skin:    Findings: No rash.  Neurological:     Mental Status: She is alert.     GCS: GCS eye subscore is 4. GCS verbal subscore is 5. GCS motor subscore is 6.     Comments: Slow but normal speech.  She is alert and oriented x3.  Psychiatric:        Behavior: Behavior is cooperative.        Thought Content: Thought content is not paranoid. Thought content does not include homicidal or suicidal ideation.     ED Results / Procedures / Treatments   Labs (all labs ordered are listed, but only abnormal results are  displayed) Labs Reviewed  COMPREHENSIVE METABOLIC PANEL - Abnormal; Notable for the following components:      Result Value   Potassium 3.3 (*)    Chloride 92 (*)    Glucose, Bld 184 (*)    BUN 42 (*)    Creatinine, Ser 2.11 (*)    Total Protein 8.5 (*)    GFR calc non Af Amer 27 (*)    GFR calc Af Amer 32 (*)    Anion gap 17 (*)    All other components within normal limits  CBC WITH DIFFERENTIAL/PLATELET - Abnormal; Notable for the following components:   RBC 5.61 (*)    HCT 46.1 (*)    MCH 25.8 (*)    All other components within normal limits  I-STAT BETA HCG BLOOD, ED ([facility], [facility], [facility] ONLY) - Abnormal; Notable for the following components:   I-stat hCG, quantitative 6.6 (*)    All other components within normal limits  RESPIRATORY PANEL BY RT PCR (FLU A&B, COVID)  ETHANOL  RAPID URINE DRUG SCREEN, HOSP PERFORMED  URINALYSIS, ROUTINE W REFLEX MICROSCOPIC    EKG None  Radiology No results found.  Procedures Procedures (including critical care time)  Medications Ordered in ED Medications  sodium chloride 0.9 % bolus 1,000 mL (1,000 mLs Intravenous New Bag/Given (Non-Interop) 03/14/20 1229)  ondansetron (ZOFRAN) injection 4 mg (4 mg Intravenous Given 03/14/20 1233)  sodium chloride 0.9 % bolus 1,000 mL (1,000 mLs Intravenous New Bag/Given (Non-Interop) 03/14/20 1340)    ED Course  I have reviewed the triage vital signs and the nursing notes.  Pertinent labs & imaging results that were available during my care of the patient were reviewed by me and considered in my medical decision making (see chart for details).    MDM Rules/Calculators/A&P                      BP 123/74 (BP Location: Left Arm)   Pulse 72   Temp (!) 97.5 F (36.4 C) (Oral)   Resp 18   SpO2 100%   Final Clinical Impression(s) / ED Diagnoses Final diagnoses:  Nausea vomiting and diarrhea  Dehydration    Rx / DC Orders ED Discharge Orders         Ordered    ondansetron (ZOFRAN) 4 MG tablet   Every 8 hours PRN     03/14/20 1509         11:58 AM Patient report nausea vomiting diarrhea for the past 2 to 3 weeks and states she thinks she may have the flu.  On exam, patient is well-appearing, speaks in slow sentences but is alert and oriented.  She does not appear to be dehydrated.  Work-up initiated.  IV fluid given along with antinausea medication  1:53 PM Labs remarkable for impaired renal function with BUN 42, creatinine 2.1.    3:07 PM Mildly elevated i-STAT hCG however unlikely that patient is pregnant.  Her Covid test is negative.  Patient received 2 L of IV fluid and felt better.  No active vomiting in the ED.  She was able to urinate and denies any urinary symptoms.  At this time she is tolerates p.o. and stable to be discharged.  She may return if her symptoms worsen.   Domenic Moras, PA-C 03/14/20 1510    Veryl Speak, MD 03/14/20 [phone]

## 2020-03-14 NOTE — ED Notes (Signed)
Please call for transportation home, Veneta Penton, [phone]. Or call her supervisor, Anette Riedel, [phone].

## 2020-03-14 NOTE — ED Triage Notes (Signed)
Pt BIBA from home.   Per EMS-  EMS called by MH case manager d/t pt "acting different"- "pt slurring words" pt c/o n/v x3 weeks, only PO intake today was a sprite. Denies abd pain.   AOx4, ambulatory on scene. NSR  130/67 70 98% RA 19 RR CBG 182 97.3 temporal

## 2020-03-20 ENCOUNTER — Other Ambulatory Visit: Payer: Self-pay

## 2020-03-20 ENCOUNTER — Emergency Department (HOSPITAL_COMMUNITY)
Admission: EM | Admit: 2020-03-20 | Discharge: 2020-03-21 | Disposition: A | Discharge status: Home / Self Care | Payer: Self-pay | Attending: Emergency Medicine | Admitting: Emergency Medicine

## 2020-03-20 ENCOUNTER — Encounter (HOSPITAL_COMMUNITY): Payer: Self-pay | Admitting: Emergency Medicine

## 2020-03-20 DIAGNOSIS — R109 Unspecified abdominal pain: Secondary | ICD-10-CM | POA: Insufficient documentation

## 2020-03-20 DIAGNOSIS — R441 Visual hallucinations: Secondary | ICD-10-CM | POA: Insufficient documentation

## 2020-03-20 DIAGNOSIS — N3000 Acute cystitis without hematuria: Secondary | ICD-10-CM | POA: Insufficient documentation

## 2020-03-20 DIAGNOSIS — F209 Schizophrenia, unspecified: Secondary | ICD-10-CM | POA: Insufficient documentation

## 2020-03-20 DIAGNOSIS — F23 Brief psychotic disorder: Secondary | ICD-10-CM | POA: Insufficient documentation

## 2020-03-20 DIAGNOSIS — F259 Schizoaffective disorder, unspecified: Secondary | ICD-10-CM | POA: Diagnosis present

## 2020-03-20 DIAGNOSIS — R440 Auditory hallucinations: Secondary | ICD-10-CM | POA: Insufficient documentation

## 2020-03-20 LAB — CBC WITH DIFFERENTIAL/PLATELET
Abs Immature Granulocytes: 0.02 10*3/uL (ref 0.00–0.07)
Basophils Absolute: 0 10*3/uL (ref 0.0–0.1)
Basophils Relative: 0 %
Eosinophils Absolute: 0.1 10*3/uL (ref 0.0–0.5)
Eosinophils Relative: 1 %
HCT: 33.4 % — ABNORMAL LOW (ref 36.0–46.0)
Hemoglobin: 10.6 g/dL — ABNORMAL LOW (ref 12.0–15.0)
Immature Granulocytes: 0 %
Lymphocytes Relative: 28 %
Lymphs Abs: 1.3 10*3/uL (ref 0.7–4.0)
MCH: 26.1 pg (ref 26.0–34.0)
MCHC: 31.7 g/dL (ref 30.0–36.0)
MCV: 82.3 fL (ref 80.0–100.0)
Monocytes Absolute: 0.6 10*3/uL (ref 0.1–1.0)
Monocytes Relative: 13 %
Neutro Abs: 2.7 10*3/uL (ref 1.7–7.7)
Neutrophils Relative %: 58 %
Platelets: 210 10*3/uL (ref 150–400)
RBC: 4.06 MIL/uL (ref 3.87–5.11)
RDW: 15.9 % — ABNORMAL HIGH (ref 11.5–15.5)
WBC: 4.7 10*3/uL (ref 4.0–10.5)
nRBC: 0 % (ref 0.0–0.2)

## 2020-03-20 LAB — URINALYSIS, ROUTINE W REFLEX MICROSCOPIC
Bilirubin Urine: NEGATIVE
Glucose, UA: NEGATIVE mg/dL
Hgb urine dipstick: NEGATIVE
Ketones, ur: NEGATIVE mg/dL
Nitrite: NEGATIVE
Protein, ur: 30 mg/dL — AB
Specific Gravity, Urine: 1.008 (ref 1.005–1.030)
pH: 9 — ABNORMAL HIGH (ref 5.0–8.0)

## 2020-03-20 LAB — COMPREHENSIVE METABOLIC PANEL
ALT: 28 U/L (ref 0–44)
AST: 30 U/L (ref 15–41)
Albumin: 3.6 g/dL (ref 3.5–5.0)
Alkaline Phosphatase: 63 U/L (ref 38–126)
Anion gap: 11 (ref 5–15)
BUN: 12 mg/dL (ref 6–20)
CO2: 26 mmol/L (ref 22–32)
Calcium: 7.8 mg/dL — ABNORMAL LOW (ref 8.9–10.3)
Chloride: 103 mmol/L (ref 98–111)
Creatinine, Ser: 1.1 mg/dL — ABNORMAL HIGH (ref 0.44–1.00)
GFR calc Af Amer: >60 mL/min (ref >60)
GFR calc non Af Amer: >60 mL/min (ref >60)
Glucose, Bld: 120 mg/dL — ABNORMAL HIGH (ref 70–99)
Potassium: 3.7 mmol/L (ref 3.5–5.1)
Sodium: 140 mmol/L (ref 135–145)
Total Bilirubin: 0.4 mg/dL (ref 0.3–1.2)
Total Protein: 6.8 g/dL (ref 6.5–8.1)

## 2020-03-20 LAB — ETHANOL: Alcohol, Ethyl (B): <10 mg/dL (ref <10)

## 2020-03-20 LAB — RAPID URINE DRUG SCREEN, HOSP PERFORMED
Amphetamines: NOT DETECTED
Barbiturates: NOT DETECTED
Benzodiazepines: NOT DETECTED
Cocaine: NOT DETECTED
Opiates: NOT DETECTED
Tetrahydrocannabinol: NOT DETECTED

## 2020-03-20 LAB — PREGNANCY, URINE: Preg Test, Ur: NEGATIVE

## 2020-03-20 MED ORDER — CEPHALEXIN 500 MG PO CAPS
500.0000 mg | ORAL_CAPSULE | Freq: Once | ORAL | Status: AC
  Administered 2020-03-21: 500 mg via ORAL
  Filled 2020-03-20: qty 1

## 2020-03-20 MED ORDER — STERILE WATER FOR INJECTION IJ SOLN
INTRAMUSCULAR | Status: AC
  Filled 2020-03-20: qty 10

## 2020-03-20 MED ORDER — ZIPRASIDONE MESYLATE 20 MG IM SOLR
INTRAMUSCULAR | Status: AC
  Filled 2020-03-20: qty 20
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
## 2020-03-20 NOTE — ED Notes (Signed)
Pt given soup and ginger ale, RN is aware.
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
## 2020-03-20 NOTE — Progress Notes (Signed)
Renaye Rakers, NP recommends inpt tx. EDP Geoffery Lyons, MD and Sammuel Hines, RN have been advised.

## 2020-03-20 NOTE — Progress Notes (Signed)
Patient meets inpatient criteria per Stephanie Rakers, NP. Patient has been faxed out to the following facilities for review:   [facility]  [facility]  [facility]  [facility]  TTS will continue to follow and assist with securing bed placement.   Stephanie [name], MSW, LCSW-A Clinical Disposition Social Worker [facility]/TTS [phone]

## 2020-03-20 NOTE — Progress Notes (Signed)
TTS trying to reach nurse to set up telepsych cart for assessment but no answer
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
## 2020-03-21 DIAGNOSIS — F250 Schizoaffective disorder, bipolar type: Secondary | ICD-10-CM

## 2020-03-21 NOTE — ED Notes (Signed)
Pt to room 27. Pt guarded, irritable, resting in bed

## 2020-03-21 NOTE — BH Assessment (Signed)
BHH Assessment Progress Note  Per Ophelia Shoulder, FNP, this pt does not require psychiatric hospitalization at this time.  Pt is to be discharged from [facility] with recommendation to follow up with [facility].  This has been included in pt's discharge instructions.  Pt's nurse, Waynetta Sandy, has been notified.  Doylene Canning, MA Triage Specialist [phone]

## 2020-03-21 NOTE — ED Notes (Signed)
Requested for patient to change out into scrubs.

## 2020-03-21 NOTE — ED Notes (Signed)
Pt DC off unit to home per provider. Pt alert, cooperative at discharge, no s/s of distress.pt psych cleared.  DC information given to and reviewed with pt, pt acknowledged understanding. Belongings given to pt. Pt ambulatory off unit, escorted by MHT. Pt walking, according to pt she lives close.

## 2020-03-21 NOTE — BHH Suicide Risk Assessment (Cosign Needed)
Suicide Risk Assessment  Discharge Assessment   [facility] Discharge Suicide Risk Assessment   Principal Problem: Schizoaffective disorder [facility]) Discharge Diagnoses: Principal Problem:   Schizoaffective disorder ([place])  Stephanie [name], 44 y.o., female patient seen via telepsych by this provider, and Dr. Dwyane Dee on 03/21/2020.  Chart reviewed. th Dr. Dwyane Dee on 03/21/20.  On evaluation Stephanie [name] reports, " I 'm here because I've been hearing voices for months".  She reports chronic auditory hallucinations consistent with previous reports but is not seen responding to internal stimulus during assessment.  Her presentation today is significantly improved since yesterday, as today she is clear and coherent and willing to talk with the mental health team.   She states she continues to hear voices but this appears to be her baseline in the setting diagnosed schizoaffective disorder with non-adherence to psychotropic medications.  She is connected with [facility] but it is unclear of when she last saw them. She states she lives alone and is able to manage her care independently.  She denies family or friends that can be contacted for collateral.  She states she is unemployed and relies on [employer] to pay her bills.  During the assessment, She asks a lot of questions about psyhciatric inpatient hospitalization but answers most of them herself, she appears very treatment savvy. She is not interested in starting/resuming psychiatric medications today.      During evaluation Stephanie [name] is laying on the hospital gurney; she is alert/oriented x 4; calm/cooperative; and mood congruent with affect.  Patient is speaking in a clear tone at moderate volume, and normal pace; with good eye contact.  Her thought process is coherent and relevant; There is no indication that she is currently responding to internal/external stimuli or experiencing delusional thought content.  Patient denies  suicidal/self-harm/homicidal ideation, psychosis, and paranoia.  Patient has remained calm throughout assessment and has answered questions appropriately.    Total Time spent with patient: 30 minutes  Musculoskeletal: Strength & Muscle Tone: within normal limits Gait & Station: normal Patient leans: N/A  Psychiatric Specialty Exam:   Blood pressure 108/80, pulse 81, temperature 97.8 F (36.6 C), temperature source Oral, resp. rate 17, height 5\' 1"  (1.549 m), weight 65.8 kg, SpO2 98 %.Body mass index is 27.4 kg/m.  General Appearance: Casual and Fairly Groomed  Eye Contact::  Good  Speech:  Clear and Coherent and Normal Rate409  Volume:  Normal  Mood:  Euthymic and patient appears relaxed laying on hospital gurney  Affect:  Appropriate and Congruent  Thought Process:  Coherent and Descriptions of Associations: Intact  Orientation:  Full (Time, Place, and Person)  Thought Content:  Logical  Suicidal Thoughts:  No  Homicidal Thoughts:  No  Memory:  Immediate;   Fair Recent;   Fair Remote;   Fair  Judgement:  Other:  Fair in the setting of dx schizoaffective disorder and medical non-adherence  Insight:  Fair  Psychomotor Activity:  Normal  Concentration:  Good  Recall:  Good  Fund of Knowledge:Good  Language: Good  Akathisia:  Negative  Handed:  Right  AIMS (if indicated):     Assets:  Communication Skills Housing Social Support  Sleep:     Cognition: Impaired,  Mild  ADL's:  Intact   Mental Status Per Nursing Assessment::   On Admission:     Demographic Factors:  Living alone  Loss Factors: NA  Historical Factors: Impulsivity and in the setting of dx psychiatric disorder and medication non-compliance  Risk Reduction Factors:   Positive social support  Continued Clinical Symptoms:  Previous Psychiatric Diagnoses and Treatments  Cognitive Features That Contribute To Risk:  Closed-mindedness    Suicide Risk:  Mild:  Suicidal ideation of limited frequency,  intensity, duration, and specificity.  There are no identifiable plans, no associated intent, mild dysphoria and related symptoms, good self-control (both objective and subjective assessment), few other risk factors, and identifiable protective factors, including available and accessible social support.   Plan Of Care/Follow-up recommendations:  Initially had planned to gain collateral information to determine if she would benefit from inpatient stay for medication mgmt but in the setting of her increasing frustration with being hospitalized and request for leave, will psych clear her and recommend discharge.   Plan- As per above assessment , there are no current grounds for involuntary commitment at this time. Patient is not currently interested in inpatient services but accepts resources for outpatient.  I have asked Stephanie [name], SW to provide resources for follow-up with [facility].     Spoke with Dr. Richrd Humbles, EDP; informed of above recommendation and disposition   Stephanie Abrahams, NP 03/21/2020, 2:38 PM

## 2020-03-21 NOTE — ED Notes (Signed)
Pt calling out, irritable, rambling, talking about being ready to go home.

## 2020-03-21 NOTE — Discharge Instructions (Signed)
For your mental health needs, you are advised to follow up with [facility].  Call them at your earliest opportunity to schedule an intake appointment:       [facility]      [address]      [place], [place] [address]      [phone]      Crisis number: [phone]  

## 2020-03-21 NOTE — ED Notes (Signed)
Pt alert, irritated. Calling out. Rambling. Threatening. Pt reassured.

## 2020-03-24 ENCOUNTER — Encounter (HOSPITAL_COMMUNITY): Payer: Self-pay | Admitting: Emergency Medicine

## 2020-03-24 ENCOUNTER — Other Ambulatory Visit: Payer: Self-pay

## 2020-03-24 ENCOUNTER — Emergency Department (HOSPITAL_COMMUNITY)
Admission: EM | Admit: 2020-03-24 | Discharge: 2020-03-25 | Disposition: A | Discharge status: Other Acute Inpatient Hospital | Payer: Self-pay | Attending: Emergency Medicine | Admitting: Emergency Medicine

## 2020-03-24 DIAGNOSIS — F29 Unspecified psychosis not due to a substance or known physiological condition: Secondary | ICD-10-CM

## 2020-03-24 DIAGNOSIS — F99 Mental disorder, not otherwise specified: Secondary | ICD-10-CM

## 2020-03-24 DIAGNOSIS — Z20822 Contact with and (suspected) exposure to covid-19: Secondary | ICD-10-CM | POA: Insufficient documentation

## 2020-03-24 DIAGNOSIS — F209 Schizophrenia, unspecified: Secondary | ICD-10-CM | POA: Insufficient documentation

## 2020-03-24 LAB — CBC WITH DIFFERENTIAL/PLATELET
Abs Immature Granulocytes: 0.01 10*3/uL (ref 0.00–0.07)
Basophils Absolute: 0 10*3/uL (ref 0.0–0.1)
Basophils Relative: 0 %
Eosinophils Absolute: 0 10*3/uL (ref 0.0–0.5)
Eosinophils Relative: 1 %
HCT: 35.5 % — ABNORMAL LOW (ref 36.0–46.0)
Hemoglobin: 10.7 g/dL — ABNORMAL LOW (ref 12.0–15.0)
Immature Granulocytes: 0 %
Lymphocytes Relative: 23 %
Lymphs Abs: 1.3 10*3/uL (ref 0.7–4.0)
MCH: 25.3 pg — ABNORMAL LOW (ref 26.0–34.0)
MCHC: 30.1 g/dL (ref 30.0–36.0)
MCV: 83.9 fL (ref 80.0–100.0)
Monocytes Absolute: 0.5 10*3/uL (ref 0.1–1.0)
Monocytes Relative: 8 %
Neutro Abs: 3.8 10*3/uL (ref 1.7–7.7)
Neutrophils Relative %: 68 %
Platelets: 306 10*3/uL (ref 150–400)
RBC: 4.23 MIL/uL (ref 3.87–5.11)
RDW: 16.1 % — ABNORMAL HIGH (ref 11.5–15.5)
WBC: 5.6 10*3/uL (ref 4.0–10.5)
nRBC: 0 % (ref 0.0–0.2)

## 2020-03-24 LAB — URINALYSIS, ROUTINE W REFLEX MICROSCOPIC
Bilirubin Urine: NEGATIVE
Glucose, UA: NEGATIVE mg/dL
Hgb urine dipstick: NEGATIVE
Ketones, ur: 80 mg/dL — AB
Nitrite: NEGATIVE
Protein, ur: 100 mg/dL — AB
Specific Gravity, Urine: 1.017 (ref 1.005–1.030)
pH: 5 (ref 5.0–8.0)

## 2020-03-24 LAB — COMPREHENSIVE METABOLIC PANEL
ALT: 29 U/L (ref 0–44)
AST: 32 U/L (ref 15–41)
Albumin: 4.3 g/dL (ref 3.5–5.0)
Alkaline Phosphatase: 64 U/L (ref 38–126)
Anion gap: 14 (ref 5–15)
BUN: 9 mg/dL (ref 6–20)
CO2: 24 mmol/L (ref 22–32)
Calcium: 8.8 mg/dL — ABNORMAL LOW (ref 8.9–10.3)
Chloride: 103 mmol/L (ref 98–111)
Creatinine, Ser: 0.97 mg/dL (ref 0.44–1.00)
GFR calc Af Amer: >60 mL/min (ref >60)
GFR calc non Af Amer: >60 mL/min (ref >60)
Glucose, Bld: 79 mg/dL (ref 70–99)
Potassium: 3.8 mmol/L (ref 3.5–5.1)
Sodium: 141 mmol/L (ref 135–145)
Total Bilirubin: 0.7 mg/dL (ref 0.3–1.2)
Total Protein: 8.4 g/dL — ABNORMAL HIGH (ref 6.5–8.1)

## 2020-03-24 LAB — ETHANOL: Alcohol, Ethyl (B): <10 mg/dL (ref <10)

## 2020-03-24 LAB — RAPID URINE DRUG SCREEN, HOSP PERFORMED
Amphetamines: NOT DETECTED
Barbiturates: NOT DETECTED
Benzodiazepines: NOT DETECTED
Cocaine: NOT DETECTED
Opiates: NOT DETECTED
Tetrahydrocannabinol: NOT DETECTED

## 2020-03-24 LAB — PREGNANCY, URINE: Preg Test, Ur: NEGATIVE

## 2020-03-24 MED ORDER — ACETAMINOPHEN 325 MG PO TABS: 650.0000 mg | ORAL_TABLET | ORAL | Status: DC | PRN

## 2020-03-24 MED ORDER — ONDANSETRON HCL 4 MG PO TABS: 4.0000 mg | ORAL_TABLET | Freq: Three times a day (TID) | ORAL | Status: DC | PRN
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
## 2020-03-24 NOTE — BH Assessment (Signed)
Tele Assessment Note   Patient Name: Stephanie [name] MRN: [id number] Referring Physician: Raeford Razor, MD Location of Patient: [facility] Location of Provider: Behavioral Health TTS Department  Stephanie [name] is a 44 y.o. female who was brought to [facility] due to becoming destructive towards her apartment, including, but not limited to, tearing down a cabinet off of the wall and throwing the refrigerator out onto the lawn. Pt had also apparently thrown some of her own belongings, such as her clothing, onto the lawn as well. Pt is reportedly not taking medication and denies the diagnosis of schizophrenia.  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 She shares she was in X-Men and that Sharyn Lull wanted her to be in the movie with him again, as did Beckie Salts. She stated Trump won the presidency, so he is indeed the current president, and she stated that COVID is a conspiracy.  Pt's protective factors include no HI and it appears that she does not use substances, as her UDA was negative.  Pt declined to provide clinician with verbal consent to contact any of her family members, as she no longer has a relationship with any of them.  Pt is oriented x4; she couldn't remember the date but utilized her wristband to find it. She also mentioned Trump still being president, but many people continue to believe this, so it's difficult to use this as a form of determining orientation and will have to be considered in the future by clinician as to whether it is, indeed, a good measuring tool to use. Pt's recent and remote memory was UTA. Pt was cooperative, at times during the assessment, but at most times she had flight of ideas, and near the end, when clinician was attempting to end the  assessment, pt accused clinician of calling her by her wrong name/something different (which she didn't), became angry, and then began yelling for clinician to "stop calling me that!" which, again, clinician clinician did not only not say, she didn't continue to not say it; it is believed pt was experiencing AH. Pt's insight, judgement, and impulse control is impaired at this time.   Diagnosis: F20.9, Schizophrenia   Past Medical History:  Past Medical History:  Diagnosis Date  . Schizophrenia [facility])     Past Surgical History:  Procedure Laterality Date  . INDUCED ABORTION      Family History: History reviewed. No pertinent family history.  Social History:  reports that she has never smoked. She has never used smokeless tobacco. She reports that she does not drink alcohol or use drugs.  Additional Social History:  Alcohol / Drug Use Pain Medications: Please see MAR Prescriptions: Please see MAR Over the Counter: Please see MAR Longest period of sobriety (when/how long): Pt denies SA - DUA is negative  CIWA: CIWA-Ar BP: (!) 151/72 Pulse Rate: 72 COWS:    Allergies:  Allergies  Allergen Reactions  . Haldol [Haloperidol Lactate] Anaphylaxis  . Penicillins Nausea Only    Did it involve swelling of the face/tongue/throat, SOB, or low BP? No Did it involve sudden or severe rash/hives, skin peeling, or any reaction on the inside of your mouth or nose? No Did you need to seek medical attention at a hospital  or doctor's office? No When did it last happen?unknown  If all above answers are "NO", may proceed with cephalosporin use.   Marland Kitchen Penicillins Nausea And Vomiting    Home Medications: (Not in a hospital admission)   OB/GYN Status:  No LMP recorded.  General Assessment Data Location of Assessment: [facility] TTS Assessment: In system Is this a Tele or Face-to-Face Assessment?: Tele Assessment Is this an Initial Assessment or a Re-assessment for this encounter?: Initial  Assessment Patient Accompanied by:: N/A Language Other than English: No Living Arrangements: Other (Comment)(Pt lives in her own apt through Chief Operating Officer) What gender do you identify as?: Female Marital status: Single Pregnancy Status: No Living Arrangements: Alone Can pt return to current living arrangement?: (Unknown - tore off cabinets in apt, threw fridge on lawn) Admission Status: Voluntary Is patient capable of signing voluntary admission?: Yes Referral Source: MD Insurance type: Self-pay     Crisis Care Plan Living Arrangements: Alone Legal Guardian: (Self) Name of Psychiatrist: UTA due to [place] Name of Therapist: UTA due to AMS  Education Status Is patient currently in school?: No Is the patient employed, unemployed or receiving disability?: (UTA due to [place])  Risk to self with the past 6 months Suicidal Ideation: No Has patient been a risk to self within the past 6 months prior to admission? : No Suicidal Intent: No Has patient had any suicidal intent within the past 6 months prior to admission? : No Is patient at risk for suicide?: No Suicidal Plan?: No Has patient had any suicidal plan within the past 6 months prior to admission? : No Access to Means: No What has been your use of drugs/alcohol within the last 12 months?: Pt denies - UDA was negative Previous Attempts/Gestures: No How many times?: 0 Other Self Harm Risks: Pt is currently manic and delusional Triggers for Past Attempts: None known Intentional Self Injurious Behavior: None Family Suicide History: Unable to assess Recent stressful life event(s): Other (Comment)(Delusions) Persecutory voices/beliefs?: No Depression: No Depression Symptoms: Feeling angry/irritable Substance abuse history and/or treatment for substance abuse?: No Suicide prevention information given to non-admitted patients: Not applicable  Risk to Others within the past 6 months Homicidal Ideation: No Does patient have any  lifetime risk of violence toward others beyond the six months prior to admission? : No Thoughts of Harm to Others: No Current Homicidal Intent: No Current Homicidal Plan: No Access to Homicidal Means: No Identified Victim: None noted History of harm to others?: No Assessment of Violence: None Noted Violent Behavior Description: None noted Does patient have access to weapons?: No(Pt denied access to guns/weapons) Criminal Charges Pending?: (States shemight have been charged w/ destruction of property) Does patient have a court date: (Unsure) Is patient on probation?: No  Psychosis Hallucinations: Auditory, Visual(Pt denies, though AVH is reacting to internal stimuli) Delusions: Grandiose, Persecutory  Mental Status Report Appearance/Hygiene: In scrubs Eye Contact: Good Motor Activity: Other (Comment), Agitation(Pt is sitting up in her bed, becomes agitated at times) Speech: Aggressive, Loud Level of Consciousness: Irritable, Alert Mood: Anxious, Suspicious Affect: Irritable Anxiety Level: Minimal Thought Processes: Flight of Ideas Judgement: Impaired Orientation: Person, Place, Time Obsessive Compulsive Thoughts/Behaviors: Moderate  Cognitive Functioning Concentration: Poor Memory: Unable to Assess Is patient IDD: No Insight: Poor Impulse Control: Poor Appetite: (UTA) Have you had any weight changes? : (UTA) Sleep: Unable to Assess Total Hours of Sleep: (UTA) Vegetative Symptoms: Unable to Assess  ADLScreening [facility] Assessment Services) Patient's cognitive ability adequate to safely complete daily activities?:  Yes Patient able to express need for assistance with ADLs?: Yes Independently performs ADLs?: Yes (appropriate for developmental age)  Prior Inpatient Therapy Prior Inpatient Therapy: Yes Prior Therapy Dates: 2020 Prior Therapy Facilty/Provider(s): [facility] Reason for Treatment: Schizophrenia  Prior Outpatient Therapy Prior Outpatient Therapy: (UTA) Does  patient have an ACCT team?: No Does patient have Intensive In-House Services?  : No Does patient have [facility] services? : No Does patient have P4CC services?: No  ADL Screening (condition at time of admission) Patient's cognitive ability adequate to safely complete daily activities?: Yes Is the patient deaf or have difficulty hearing?: No Does the patient have difficulty seeing, even when wearing glasses/contacts?: No Does the patient have difficulty concentrating, remembering, or making decisions?: Yes Patient able to express need for assistance with ADLs?: Yes Does the patient have difficulty dressing or bathing?: No Independently performs ADLs?: Yes (appropriate for developmental age) Does the patient have difficulty walking or climbing stairs?: No Weakness of Legs: None Weakness of Arms/Hands: None  Home Assistive Devices/Equipment Home Assistive Devices/Equipment: None  Therapy Consults (therapy consults require a physician order) PT Evaluation Needed: No OT Evalulation Needed: No SLP Evaluation Needed: No Abuse/Neglect Assessment (Assessment to be complete while patient is alone) Abuse/Neglect Assessment Can Be Completed: Unable to assess, patient is non-responsive or altered mental status Values / Beliefs Cultural Requests During Hospitalization: (UTA) Spiritual Requests During Hospitalization: (UTA) Consults Spiritual Care Consult Needed: (UTA) Transition of Care Team Consult Needed: (UTA) Advance Directives (For Healthcare) Does Patient Have a Medical Advance Directive?: Unable to assess, patient is non-responsive or altered mental status          Disposition: Nira Conn, NP, reviewed pt's chart and information and stated pt meets criteria for inpatient hospitalization. Pt is currently under review at [facility]. This information was provided to pt's EDP, Dr. Raeford Razor, at 2047.   Disposition Initial Assessment Completed for this Encounter: Yes  This  service was provided via telemedicine using a 2-way, interactive audio and video technology.  Names of all persons participating in this telemedicine service and their role in this encounter. Name: Stephanie [name] Role: Patient  Name: Nira Conn Role: Nurse Practitioner  Name: Duard Brady Role: Clinician    Ralph Dowdy 03/24/2020 8:23 PM

## 2020-03-24 NOTE — Progress Notes (Signed)
Received Stephanie [name] this PM awake in her room watching the TV with the sitter at the bedside. She spoke with TTS, afterwards she was verbally  responding to internal stimuli. She was assessed standing up in her room quietly for several hours. She was medicated with Geodon  per order and  eventually was able to rest in her bed and slept through the night without incident.
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
## 2020-03-25 ENCOUNTER — Encounter (HOSPITAL_COMMUNITY): Payer: Self-pay | Admitting: Nurse Practitioner

## 2020-03-25 ENCOUNTER — Inpatient Hospital Stay (HOSPITAL_COMMUNITY)
Admission: AD | Admit: 2020-03-25 | Discharge: 2020-03-31 | DRG: 885 | Disposition: A | Discharge status: Home / Self Care | Payer: Federal, State, Local not specified - Other | Source: Intra-hospital | Attending: Psychiatry | Admitting: Psychiatry

## 2020-03-25 DIAGNOSIS — G43909 Migraine, unspecified, not intractable, without status migrainosus: Secondary | ICD-10-CM | POA: Diagnosis present

## 2020-03-25 DIAGNOSIS — Z9114 Patient's other noncompliance with medication regimen: Secondary | ICD-10-CM | POA: Diagnosis not present

## 2020-03-25 DIAGNOSIS — F250 Schizoaffective disorder, bipolar type: Secondary | ICD-10-CM | POA: Diagnosis not present

## 2020-03-25 DIAGNOSIS — F3160 Bipolar disorder, current episode mixed, unspecified: Secondary | ICD-10-CM | POA: Diagnosis present

## 2020-03-25 DIAGNOSIS — F259 Schizoaffective disorder, unspecified: Principal | ICD-10-CM | POA: Diagnosis present

## 2020-03-25 DIAGNOSIS — G4700 Insomnia, unspecified: Secondary | ICD-10-CM | POA: Diagnosis present

## 2020-03-25 LAB — URINALYSIS, COMPLETE (UACMP) WITH MICROSCOPIC
Bilirubin Urine: NEGATIVE
Glucose, UA: NEGATIVE mg/dL
Hgb urine dipstick: NEGATIVE
Ketones, ur: 5 mg/dL — AB
Nitrite: NEGATIVE
Protein, ur: NEGATIVE mg/dL
Specific Gravity, Urine: 1.011 (ref 1.005–1.030)
pH: 5 (ref 5.0–8.0)

## 2020-03-25 LAB — RESPIRATORY PANEL BY RT PCR (FLU A&B, COVID)
Influenza A by PCR: NEGATIVE
Influenza B by PCR: NEGATIVE
SARS Coronavirus 2 by RT PCR: NEGATIVE

## 2020-03-25 LAB — URINE CULTURE

## 2020-03-25 MED ORDER — LORAZEPAM 1 MG PO TABS
1.0000 mg | ORAL_TABLET | ORAL | Status: AC | PRN
  Administered 2020-03-28: 1 mg via ORAL

## 2020-03-25 MED ORDER — ZIPRASIDONE MESYLATE 20 MG IM SOLR: 10.0000 mg | Freq: Two times a day (BID) | INTRAMUSCULAR | Status: DC | PRN

## 2020-03-25 MED ORDER — ALUM & MAG HYDROXIDE-SIMETH 200-200-20 MG/5ML PO SUSP
30.0000 mL | ORAL | Status: DC | PRN
  Administered 2020-03-28: 30 mL via ORAL

## 2020-03-25 MED ORDER — MAGNESIUM HYDROXIDE 400 MG/5ML PO SUSP: 30.0000 mL | Freq: Every day | ORAL | Status: DC | PRN

## 2020-03-25 MED ORDER — OLANZAPINE 10 MG PO TBDP
10.0000 mg | ORAL_TABLET | Freq: Three times a day (TID) | ORAL | Status: DC | PRN
  Administered 2020-03-28: 10 mg via ORAL
  Filled 2020-03-25: qty 1

## 2020-03-25 MED ORDER — LORAZEPAM 1 MG PO TABS
1.0000 mg | ORAL_TABLET | ORAL | Status: DC | PRN
  Filled 2020-03-25: qty 1

## 2020-03-25 MED ORDER — RISPERIDONE 2 MG PO TBDP
4.0000 mg | ORAL_TABLET | Freq: Every day | ORAL | Status: DC
  Administered 2020-03-25 – 2020-03-30 (×5): 4 mg via ORAL
  Filled 2020-03-25 (×3): qty 2
  Filled 2020-03-25: qty 14
  Filled 2020-03-25 (×4): qty 2

## 2020-03-25 MED ORDER — ZIPRASIDONE MESYLATE 20 MG IM SOLR
10.0000 mg | Freq: Once | INTRAMUSCULAR | Status: AC
  Administered 2020-03-25: 10 mg via INTRAMUSCULAR
  Filled 2020-03-25: qty 20

## 2020-03-25 MED ORDER — ARIPIPRAZOLE 10 MG PO TABS
10.0000 mg | ORAL_TABLET | Freq: Every day | ORAL | Status: DC
  Administered 2020-03-25 – 2020-03-28 (×4): 10 mg via ORAL
  Filled 2020-03-25 (×6): qty 1

## 2020-03-25 MED ORDER — STERILE WATER FOR INJECTION IJ SOLN
INTRAMUSCULAR | Status: AC
  Filled 2020-03-25: qty 10

## 2020-03-25 MED ORDER — HYDROXYZINE HCL 25 MG PO TABS
25.0000 mg | ORAL_TABLET | Freq: Three times a day (TID) | ORAL | Status: DC | PRN
  Filled 2020-03-25: qty 10

## 2020-03-25 MED ORDER — ZIPRASIDONE MESYLATE 20 MG IM SOLR: 20.0000 mg | INTRAMUSCULAR | Status: DC | PRN

## 2020-03-25 MED ORDER — ACETAMINOPHEN 325 MG PO TABS
650.0000 mg | ORAL_TABLET | Freq: Four times a day (QID) | ORAL | Status: DC | PRN
  Administered 2020-03-28 – 2020-03-30 (×3): 650 mg via ORAL
  Filled 2020-03-25 (×3): qty 2

## 2020-03-25 NOTE — Progress Notes (Signed)
Admission Note: Patient is a 44 year old female admitted to the unit for symptoms of paranoia, hallucinations and medication noncompliance.  Patient appears disorganized, disheveled and guarded during assessment.  Presents with a blunted affect and paranoid mood.  States she is here to work on her anger.  Admission plan of care reviewed and consent signed.  Skin assessment and personal belongings completed.  Skin is dry and intact.  No contraband found.  Patient oriented to the unit, staff and room.  Verbalizes understanding of unit rules and protocols.  Patient is safe on the unit.

## 2020-03-25 NOTE — BH Assessment (Signed)
Pt has been accepted to [facility] pending pt's negative COVID test. This information was provided to pt's nurse, Joanne RN, at 0328.  Room: 502-1 Accepting: Jason Berry, NP Attending: Dr. John Clary Call to Report: [phone] 

## 2020-03-25 NOTE — ED Notes (Signed)
Pt has been accepted to [facility] pending pt's negative COVID test. This information was provided to pt's nurse, Dominga Ferry, at [phone].  Room: 502-1 Accepting: Nira Conn, NP Attending: Dr. Jeanene Erb Call to Report: [phone]
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
## 2020-03-25 NOTE — ED Notes (Signed)
Pt DCd off unit to [facility] per provider. Pt alert, calm, cooperative, no s/s distress. DC information given to [employer] for facility. Belongings given to [employer] for facility. Pt ambulatory off unit, escorted and transported by [employer].
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
## 2020-03-25 NOTE — Progress Notes (Signed)
   03/25/20 2121  Psych Admission Type (Psych Patients Only)  Admission Status Voluntary  Psychosocial Assessment  Patient Complaints None  Eye Contact Fair  Facial Expression Anxious  Affect Blunted  Speech Logical/coherent  Interaction Assertive  Motor Activity Slow  Appearance/Hygiene Disheveled  Behavior Characteristics Cooperative  Mood Anxious  Thought Process  Coherency WDL  Content WDL  Delusions UTA  Perception WDL  Hallucination None reported or observed  Judgment Poor  Confusion None  Danger to Self  Current suicidal ideation? Denies  Danger to Others  Danger to Others None reported or observed   Pt seen at med window. Pleasant and takes her meds. Denies SI, HI, AVH and pain.

## 2020-03-26 LAB — LIPID PANEL
Cholesterol: 173 mg/dL (ref 0–200)
HDL: 47 mg/dL (ref >40)
LDL Cholesterol: 113 mg/dL — ABNORMAL HIGH (ref 0–99)
Total CHOL/HDL Ratio: 3.7 RATIO
Triglycerides: 67 mg/dL (ref <150)
VLDL: 13 mg/dL (ref 0–40)

## 2020-03-26 LAB — HEMOGLOBIN A1C
Hgb A1c MFr Bld: 6.6 % — ABNORMAL HIGH (ref 4.8–5.6)
Mean Plasma Glucose: 142.72 mg/dL

## 2020-03-26 LAB — TSH: TSH: 1.219 u[IU]/mL (ref 0.350–4.500)

## 2020-03-26 MED ORDER — SUMATRIPTAN SUCCINATE 50 MG PO TABS
50.0000 mg | ORAL_TABLET | ORAL | Status: DC | PRN
  Administered 2020-03-27 – 2020-03-28 (×2): 50 mg via ORAL
  Filled 2020-03-26 (×2): qty 1

## 2020-03-26 MED ORDER — ARIPIPRAZOLE ER 400 MG IM SRER
400.0000 mg | Freq: Once | INTRAMUSCULAR | Status: AC
  Administered 2020-03-26: 400 mg via INTRAMUSCULAR

## 2020-03-26 NOTE — Progress Notes (Signed)
Recreation Therapy Notes  Date: 5.5.21 Time: 1000 Location: 500 Hall Dayroom  Group Topic: Communication  Goal Area(s) Addresses:  Patient will effectively communicate with peers in group.  Patient will verbalize benefit of healthy communication. Patient will verbalize positive effect of healthy communication on post d/c goals.  Patient will identify communication techniques that made activity effective for group.   Behavioral Response: Engaged  Intervention:  Paper, pencils, geometric drawings   Activity: Draw This!:  Three patients volunteered to describe pictures of geometrical shapes to the remaining group.  These three patients were to be as detailed as possible when describing the pictures.  The remaining patients had to draw the pictures as they were described.  If these patients missed any of the instructions, they could only as the presenter to repeat themselves.  They could not ask any detailed questions.  Education: Communication, Discharge Planning  Education Outcome: Acknowledges understanding/In group clarification offered/Needs additional education.   Clinical Observations/Feedback: Pt was very active during activity.  As a presenter, pt felt she left a bunch out and could have been more specific.  Pt explained the activity showed communication is very important because how you express things can affect how the other person takes what you say.    Caroll Rancher, LRT/CTRS     Caroll Rancher A 03/26/2020 11:14 AM

## 2020-03-26 NOTE — [facility] Group Notes (Signed)
LCSW Aftercare Discharge Planning Group Note  03/26/2020   Type of Group and Topic: Psychoeducational Group: Discharge Planning  Participation Level: Did Not Attend  Description of Group  Discharge planning group reviews patient's anticipated discharge plans and assists patients to anticipate and address any barriers to wellness/recovery in the community. Suicide prevention education is reviewed with patients in group.  Therapeutic Goals  1. Patients will state their anticipated discharge plan and mental health aftercare  2. Patients will identify potential barriers to wellness in the community setting  3. Patients will engage in problem solving, solution focused discussion of ways to anticipate and address barriers to wellness/recovery  Summary of Patient Progress  Plan for Discharge/Comments:  Transportation Means:  Supports:  Therapeutic Modalities:  Motivational Interviewing  Dannia Snook C Kreig Parson, LCSWA  03/26/2020 1:55 PM 

## 2020-03-26 NOTE — Plan of Care (Signed)
Progress note  D: pt found in bed; compliant with medication administration. Pt denies any physical complaints or pain. Pt rates their depression/hopelessness/anxiety a 0/0/0 out of 10 respectively. Pt is minimal on assessment but pleasant. Pt states they want to work on anger management. When asked what brought the pt in, the pt stated that they took everything out of their apt. The pt states it was time for it to just go. Pt denies si/hi/ah/vh and verbally agrees to approach staff if these become apparent or before harming themself/others while at [facility].  A: Pt provided support and encouragement. Pt given medication per protocol and standing orders. Q73m safety checks implemented and continued.  R: Pt safe on the unit. Will continue to monitor.  Pt progressing in the following metrics  Problem: Education: Goal: Knowledge of [facility] Education information/materials will improve Outcome: Progressing   Problem: Coping: Goal: Coping ability will improve Outcome: Progressing Goal: Will verbalize feelings Outcome: Progressing   Problem: Health Behavior/Discharge Planning: Goal: Compliance with prescribed medication regimen will improve Outcome: Progressing
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
## 2020-03-26 NOTE — Progress Notes (Signed)
[facility] MD Progress Note  03/26/2020 10:53 AM Stephanie [name]  MRN:  [id number] Subjective: Patient is a 44 year old female with a past psychiatric history significant for schizophrenia who presented to the [facility] emergency department on 03/24/2020 after tearing up her apartment, showing evidence of psychosis.  Objective: Patient is seen and examined.  Patient is a 44 year old female with the above-stated past psychiatric history who is seen in follow-up.  She is doing better today.  She did take her medications.  She stated that the voices are "getting with the program and stopping saying things".  She denied any suicidal or homicidal ideation.  She denied any gross auditory or visual hallucinations despite what she said above.  She stated she staying in her room with the lights down because fluorescent lights make her migraine headaches worse.  No new laboratories.  Her vital signs are stable, she is afebrile.  Principal Problem: <principal problem not specified> Diagnosis: Active Problems:   Schizoaffective disorder (HCC)  Total Time spent with patient: 20 minutes  Past Psychiatric History: See admission H&P  Past Medical History:  Past Medical History:  Diagnosis Date  . Schizophrenia [facility])     Past Surgical History:  Procedure Laterality Date  . INDUCED ABORTION     Family History: History reviewed. No pertinent family history. Family Psychiatric  History: See admission H&P Social History:  Social History   Substance and Sexual Activity  Alcohol Use Never     Social History   Substance and Sexual Activity  Drug Use Never    Social History   Socioeconomic History  . Marital status: Unknown    Spouse name: Not on file  . Number of children: Not on file  . Years of education: Not on file  . Highest education level: Not on file  Occupational History  . Not on file  Tobacco Use  . Smoking status: Never Smoker  . Smokeless tobacco: Never Used   Substance and Sexual Activity  . Alcohol use: Never  . Drug use: Never  . Sexual activity: Never  Other Topics Concern  . Not on file  Social History Narrative   ** Merged History Encounter **       Social Determinants of Health   Financial Resource Strain:   . Difficulty of Paying Living Expenses:   Food Insecurity:   . Worried About Charity fundraiser in the Last Year:   . Arboriculturist in the Last Year:   Transportation Needs:   . Film/video editor (Medical):   Marland Kitchen Lack of Transportation (Non-Medical):   Physical Activity:   . Days of Exercise per Week:   . Minutes of Exercise per Session:   Stress:   . Feeling of Stress :   Social Connections:   . Frequency of Communication with Friends and Family:   . Frequency of Social Gatherings with Friends and Family:   . Attends Religious Services:   . Active Member of Clubs or Organizations:   . Attends Archivist Meetings:   Marland Kitchen Marital Status:    Additional Social History:                         Sleep: Good  Appetite:  Good  Current Medications: Current Facility-Administered Medications  Medication Dose Route Frequency Provider Last Rate Last Admin  . acetaminophen (TYLENOL) tablet 650 mg  650 mg Oral Q6H PRN Rozetta Nunnery, NP      .  alum & mag hydroxide-simeth (MAALOX/MYLANTA) 200-200-20 MG/5ML suspension 30 mL  30 mL Oral Q4H PRN Nira Conn A, NP      . ARIPiprazole (ABILIFY) tablet 10 mg  10 mg Oral Daily Antonieta Pert, MD   10 mg at 03/26/20 0748  . hydrOXYzine (ATARAX/VISTARIL) tablet 25 mg  25 mg Oral TID PRN Nira Conn A, NP      . ziprasidone (GEODON) injection 10 mg  10 mg Intramuscular Q12H PRN Jackelyn Poling, NP       And  . LORazepam (ATIVAN) tablet 1 mg  1 mg Oral PRN Nira Conn A, NP      . OLANZapine zydis (ZYPREXA) disintegrating tablet 10 mg  10 mg Oral Q8H PRN Antonieta Pert, MD       And  . LORazepam (ATIVAN) tablet 1 mg  1 mg Oral PRN Antonieta Pert,  MD       And  . ziprasidone (GEODON) injection 20 mg  20 mg Intramuscular PRN Antonieta Pert, MD      . magnesium hydroxide (MILK OF MAGNESIA) suspension 30 mL  30 mL Oral Daily PRN Nira Conn A, NP      . risperiDONE (RISPERDAL M-TABS) disintegrating tablet 4 mg  4 mg Oral QHS Antonieta Pert, MD   4 mg at 03/25/20 2056    Lab Results:  Results for orders placed or performed during the hospital encounter of 03/25/20 (from the past 48 hour(s))  Urinalysis, Complete w Microscopic     Status: Abnormal   Collection Time: 03/25/20  2:45 PM  Result Value Ref Range   Color, Urine YELLOW YELLOW   APPearance CLOUDY (A) CLEAR   Specific Gravity, Urine 1.011 1.005 - 1.030   pH 5.0 5.0 - 8.0   Glucose, UA NEGATIVE NEGATIVE mg/dL   Hgb urine dipstick NEGATIVE NEGATIVE   Bilirubin Urine NEGATIVE NEGATIVE   Ketones, ur 5 (A) NEGATIVE mg/dL   Protein, ur NEGATIVE NEGATIVE mg/dL   Nitrite NEGATIVE NEGATIVE   Leukocytes,Ua MODERATE (A) NEGATIVE   RBC / HPF 6-10 0 - 5 RBC/hpf   WBC, UA 21-50 0 - 5 WBC/hpf   Bacteria, UA FEW (A) NONE SEEN   Squamous Epithelial / LPF 21-50 0 - 5   Mucus PRESENT     Comment: Performed at [facility], [address] [place], [place] [address]  Hemoglobin A1c     Status: Abnormal   Collection Time: 03/26/20  6:15 AM  Result Value Ref Range   Hgb A1c MFr Bld 6.6 (H) 4.8 - 5.6 %    Comment: (NOTE) Pre diabetes:          5.7%-6.4% Diabetes:              >6.4% Glycemic control for   <7.0% adults with diabetes    Mean Plasma Glucose 142.72 mg/dL    Comment: Performed at [facility], [address] [place], [place] [address]  Lipid panel     Status: Abnormal   Collection Time: 03/26/20  6:15 AM  Result Value Ref Range   Cholesterol 173 0 - 200 mg/dL   Triglycerides 67 <720 mg/dL   HDL 47 >94 mg/dL   Total CHOL/HDL Ratio 3.7 RATIO   VLDL 13 0 - 40 mg/dL   LDL Cholesterol 709 (H) 0 - 99 mg/dL    Comment:        Total  Cholesterol/HDL:CHD Risk Coronary Heart Disease Risk Table  Men   Women  1/2 Average Risk   3.4   3.3  Average Risk       5.0   4.4  2 X Average Risk   9.6   7.1  3 X Average Risk  23.4   11.0        Use the calculated Patient Ratio above and the CHD Risk Table to determine the patient's CHD Risk.        ATP III CLASSIFICATION (LDL):  <100     mg/dL   Optimal  762-831  mg/dL   Near or Above                    Optimal  130-159  mg/dL   Borderline  517-616  mg/dL   High  >073     mg/dL   Very High Performed at [facility], [address], [place], [place] [address]   TSH     Status: None   Collection Time: 03/26/20  6:15 AM  Result Value Ref Range   TSH 1.219 0.350 - 4.500 uIU/mL    Comment: Performed by a 3rd Generation assay with a functional sensitivity of <=0.01 uIU/mL. Performed at [facility], [address], [place], [place] [address]     Blood Alcohol level:  Lab Results  Component Value Date   ETH <10 03/24/2020   ETH <10 03/20/2020    Metabolic Disorder Labs: Lab Results  Component Value Date   HGBA1C 6.6 (H) 03/26/2020   MPG 142.72 03/26/2020   No results found for: PROLACTIN Lab Results  Component Value Date   CHOL 173 03/26/2020   TRIG 67 03/26/2020   HDL 47 03/26/2020   CHOLHDL 3.7 03/26/2020   VLDL 13 03/26/2020   LDLCALC 113 (H) 03/26/2020    Physical Findings: AIMS: Facial and Oral Movements Muscles of Facial Expression: None, normal Lips and Perioral Area: None, normal Jaw: None, normal Tongue: None, normal,Extremity Movements Upper (arms, wrists, hands, fingers): None, normal Lower (legs, knees, ankles, toes): None, normal, Trunk Movements Neck, shoulders, hips: None, normal, Overall Severity Severity of abnormal movements (highest score from questions above): None, normal Incapacitation due to abnormal movements: None, normal Patient's awareness of abnormal movements (rate  only patient's report): No Awareness, Dental Status Current problems with teeth and/or dentures?: No Does patient usually wear dentures?: No  CIWA:    COWS:     Musculoskeletal: Strength & Muscle Tone: within normal limits Gait & Station: normal Patient leans: N/A  Psychiatric Specialty Exam: Physical Exam  Nursing note and vitals reviewed. Constitutional: She is oriented to person, place, and time. She appears well-developed and well-nourished.  HENT:  Head: Normocephalic and atraumatic.  Respiratory: Effort normal.  Neurological: She is alert and oriented to person, place, and time.    Review of Systems  Blood pressure 123/81, pulse 77, temperature 97.7 F (36.5 C), temperature source Oral, resp. rate 18, height 5\' 1"  (1.549 m), weight 68.9 kg, SpO2 100 %.Body mass index is 28.72 kg/m.  General Appearance: Disheveled  Eye Contact:  Fair  Speech:  Normal Rate  Volume:  Decreased  Mood:  Dysphoric  Affect:  Flat  Thought Process:  Goal Directed and Descriptions of Associations: Loose  Orientation:  Full (Time, Place, and Person)  Thought Content:  Delusions, Hallucinations: Auditory and Paranoid Ideation  Suicidal Thoughts:  No  Homicidal Thoughts:  No  Memory:  Immediate;   Fair Recent;   Fair Remote;  Fair  Judgement:  Intact  Insight:  Fair  Psychomotor Activity:  Normal  Concentration:  Concentration: Fair and Attention Span: Fair  Recall:  [employer] of Knowledge:  Fair  Language:  Good  Akathisia:  Negative  Handed:  Right  AIMS (if indicated):     Assets:  Desire for Improvement Resilience  ADL's:  Intact  Cognition:  WNL  Sleep:  Number of Hours: 6.75     Treatment Plan Summary: Daily contact with patient to assess and evaluate symptoms and progress in treatment, Medication management and Plan : Patient is seen and examined.  Patient is a 45 year old female with the above-stated past psychiatric history who is seen in follow-up.   Diagnosis: #1  schizophrenia, #2 migraine headaches  Patient is seen in follow-up.  She is doing better today.  She took her oral medications.  We will attempt to give her the long-acting Abilify injection today.  No change in her medications per se.  I will add sumatriptan as needed for migraine headaches.  1.  Continue Abilify 10 mg p.o. daily for psychosis. 2.  Give long-acting Abilify injection 400 mg IM x1 for psychosis today. 3.  Continue Risperdal 4 mg p.o. nightly for psychosis. 4.  Add sumatriptan 50 mg p.o. as needed migraine headache. 5.  Disposition planning-in progress. Antonieta Pert, MD 03/26/2020, 10:53 AM
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
## 2020-03-26 NOTE — Tx Team (Signed)
Interdisciplinary Treatment and Diagnostic Plan Update  03/26/2020 Time of Session: 9:00am Stephanie [name] MRN: [id number]  Principal Diagnosis: <principal problem not specified>  Secondary Diagnoses: Active Problems:   Schizoaffective disorder ([place])   Current Medications:  Current Facility-Administered Medications  Medication Dose Route Frequency Provider Last Rate Last Admin  . acetaminophen (TYLENOL) tablet 650 mg  650 mg Oral Q6H PRN Lindon Romp A, NP      . alum & mag hydroxide-simeth (MAALOX/MYLANTA) 200-200-20 MG/5ML suspension 30 mL  30 mL Oral Q4H PRN Lindon Romp A, NP      . ARIPiprazole (ABILIFY) tablet 10 mg  10 mg Oral Daily Sharma Covert, MD   10 mg at 03/26/20 0748  . hydrOXYzine (ATARAX/VISTARIL) tablet 25 mg  25 mg Oral TID PRN Lindon Romp A, NP      . ziprasidone (GEODON) injection 10 mg  10 mg Intramuscular Q12H PRN Rozetta Nunnery, NP       And  . LORazepam (ATIVAN) tablet 1 mg  1 mg Oral PRN Lindon Romp A, NP      . OLANZapine zydis (ZYPREXA) disintegrating tablet 10 mg  10 mg Oral Q8H PRN Sharma Covert, MD       And  . LORazepam (ATIVAN) tablet 1 mg  1 mg Oral PRN Sharma Covert, MD       And  . ziprasidone (GEODON) injection 20 mg  20 mg Intramuscular PRN Sharma Covert, MD      . magnesium hydroxide (MILK OF MAGNESIA) suspension 30 mL  30 mL Oral Daily PRN Lindon Romp A, NP      . risperiDONE (RISPERDAL M-TABS) disintegrating tablet 4 mg  4 mg Oral QHS Sharma Covert, MD   4 mg at 03/25/20 2056   PTA Medications: Medications Prior to Admission  Medication Sig Dispense Refill Last Dose  . ARIPiprazole ER (ABILIFY MAINTENA) 400 MG SRER injection Inject 2 mLs (400 mg total) into the muscle every 28 (twenty-eight) days. Due approx 6/13 (Patient not taking: Reported on 03/14/2020) 1 each 11   . benztropine (COGENTIN) 1 MG tablet Take 1 tablet (1 mg total) by mouth 2 (two) times daily. (Patient not taking: Reported on 03/14/2020) 60 tablet 2    . ondansetron (ZOFRAN) 4 MG tablet Take 1 tablet (4 mg total) by mouth every 8 (eight) hours as needed for nausea or vomiting. 12 tablet 0   . risperiDONE (RISPERDAL) 4 MG tablet Take 1 tablet (4 mg total) by mouth at bedtime. (Patient not taking: Reported on 03/14/2020) 30 tablet 1     Patient Stressors:    Patient Strengths:    Treatment Modalities: Medication Management, Group therapy, Case management,  1 to 1 session with clinician, Psychoeducation, Recreational therapy.   Physician Treatment Plan for Primary Diagnosis: <principal problem not specified> Long Term Goal(s): Improvement in symptoms so as ready for discharge Improvement in symptoms so as ready for discharge   Short Term Goals: Ability to identify changes in lifestyle to reduce recurrence of condition will improve Ability to verbalize feelings will improve Ability to disclose and discuss suicidal ideas Ability to demonstrate self-control will improve Ability to identify and develop effective coping behaviors will improve Ability to maintain clinical measurements within normal limits will improve Compliance with prescribed medications will improve Ability to identify changes in lifestyle to reduce recurrence of condition will improve Ability to verbalize feelings will improve Ability to disclose and discuss suicidal ideas Ability to demonstrate self-control will improve Ability  to identify and develop effective coping behaviors will improve Ability to maintain clinical measurements within normal limits will improve Compliance with prescribed medications will improve  Medication Management: Evaluate patient's response, side effects, and tolerance of medication regimen.  Therapeutic Interventions: 1 to 1 sessions, Unit Group sessions and Medication administration.  Evaluation of Outcomes: Not Met  Physician Treatment Plan for Secondary Diagnosis: Active Problems:   Schizoaffective disorder ([place])  Long Term  Goal(s): Improvement in symptoms so as ready for discharge Improvement in symptoms so as ready for discharge   Short Term Goals: Ability to identify changes in lifestyle to reduce recurrence of condition will improve Ability to verbalize feelings will improve Ability to disclose and discuss suicidal ideas Ability to demonstrate self-control will improve Ability to identify and develop effective coping behaviors will improve Ability to maintain clinical measurements within normal limits will improve Compliance with prescribed medications will improve Ability to identify changes in lifestyle to reduce recurrence of condition will improve Ability to verbalize feelings will improve Ability to disclose and discuss suicidal ideas Ability to demonstrate self-control will improve Ability to identify and develop effective coping behaviors will improve Ability to maintain clinical measurements within normal limits will improve Compliance with prescribed medications will improve     Medication Management: Evaluate patient's response, side effects, and tolerance of medication regimen.  Therapeutic Interventions: 1 to 1 sessions, Unit Group sessions and Medication administration.  Evaluation of Outcomes: Not Met   RN Treatment Plan for Primary Diagnosis: <principal problem not specified> Long Term Goal(s): Knowledge of disease and therapeutic regimen to maintain health will improve  Short Term Goals: Ability to verbalize feelings will improve, Ability to identify and develop effective coping behaviors will improve and Compliance with prescribed medications will improve  Medication Management: RN will administer medications as ordered by provider, will assess and evaluate patient's response and provide education to patient for prescribed medication. RN will report any adverse and/or side effects to prescribing provider.  Therapeutic Interventions: 1 on 1 counseling sessions, Psychoeducation,  Medication administration, Evaluate responses to treatment, Monitor vital signs and CBGs as ordered, Perform/monitor CIWA, COWS, AIMS and Fall Risk screenings as ordered, Perform wound care treatments as ordered.  Evaluation of Outcomes: Not Met   LCSW Treatment Plan for Primary Diagnosis: <principal problem not specified> Long Term Goal(s): Safe transition to appropriate next level of care at discharge, Engage patient in therapeutic group addressing interpersonal concerns.  Short Term Goals: Engage patient in aftercare planning with referrals and resources, Increase social support, Facilitate acceptance of mental health diagnosis and concerns, Identify triggers associated with mental health/substance abuse issues and Increase skills for wellness and recovery  Therapeutic Interventions: Assess for all discharge needs, 1 to 1 time with Social worker, Explore available resources and support systems, Assess for adequacy in community support network, Educate family and significant other(s) on suicide prevention, Complete Psychosocial Assessment, Interpersonal group therapy.  Evaluation of Outcomes: Not Met  Progress in Treatment: Attending groups: No. New to unit. Participating in groups: No. Taking medication as prescribed: Declining medications selectively. Toleration medication: Yes. Family/Significant other contact made: No, will contact:  supports if consents are granted. Patient understands diagnosis: No. Discussing patient identified problems/goals with staff: No. Medical problems stabilized or resolved: Yes. Denies suicidal/homicidal ideation: Yes. Issues/concerns per patient self-inventory: Yes.  New problem(s) identified: Yes, Describe:  unstable housing.  New Short Term/Long Term Goal(s): medication management for mood stabilization; elimination of SI thoughts; development of comprehensive mental wellness/sobriety plan.  Patient Goals:  "Work  on anger management."  Discharge  Plan or Barriers:  Patient recently admitted to unit, CSW assessing for appropriate referrals.  Reason for Continuation of Hospitalization: Anxiety Delusions  Mania Medication stabilization  Estimated Length of Stay: 5-7 days  Attendees: Patient: Stephanie [name] 03/26/2020 10:02 AM  Physician: Queen Blossom 03/26/2020 10:02 AM  Nursing:  03/26/2020 10:02 AM  RN Care Manager: 03/26/2020 10:02 AM  Social Worker: Stephanie Acre, [place] 03/26/2020 10:02 AM  Recreational Therapist:  03/26/2020 10:02 AM  Other:  03/26/2020 10:02 AM  Other:  03/26/2020 10:02 AM  Other: 03/26/2020 10:02 AM    Scribe for Treatment Team: Joellen Jersey, [place] 03/26/2020 10:02 AM

## 2020-03-26 NOTE — [facility] Suicide Risk Assessment (Signed)
[facility] INPATIENT:  Family/Significant Other Suicide Prevention Education  Suicide Prevention Education:  Patient Refusal for Family/Significant Other Suicide Prevention Education: The patient Stephanie [name] has refused to provide written consent for family/significant other to be provided Family/Significant Other Suicide Prevention Education during admission and/or prior to discharge.  Physician notified.  Darreld Mclean 03/26/2020, 2:13 PM

## 2020-03-27 NOTE — Progress Notes (Signed)
[facility] MD Progress Note  03/27/2020 11:25 AM Stephanie [name]  MRN:  [id number] Subjective:  Patient is a 44 year old female with a past psychiatric history significant for schizophrenia who presented to the [facility] emergency department on 03/24/2020 after tearing up her apartment, showing evidence of psychosis.  Objective: Patient is seen and examined.  Patient is a 44 year old female with the above-stated past psychiatric history is seen in follow-up.  She continues to slowly improve.  She denied any auditory hallucinations today.  She did take her long-acting Abilify injection yesterday.  She denied any suicidal or homicidal ideation.  She stated that she is still having a headache, but had not tried the sumatriptan.  I recommended that she get that a shot.  We discussed the possibility of discharge tomorrow.  She denied any side effects to her current medications.  Her blood pressure in the morning was stable at 132/82, but repeat showed it mildly elevated at 141/90.  She has a low-grade fever at 99.1.  Pulses normal.  She slept approximately 6 hours last night.  No new laboratories.  Principal Problem: <principal problem not specified> Diagnosis: Active Problems:   Schizoaffective disorder (HCC)  Total Time spent with patient: 15 minutes  Past Psychiatric History: See admission H&P  Past Medical History:  Past Medical History:  Diagnosis Date  . Schizophrenia [facility])     Past Surgical History:  Procedure Laterality Date  . INDUCED ABORTION     Family History: History reviewed. No pertinent family history. Family Psychiatric  History: See admission H&P Social History:  Social History   Substance and Sexual Activity  Alcohol Use Never     Social History   Substance and Sexual Activity  Drug Use Never    Social History   Socioeconomic History  . Marital status: Unknown    Spouse name: Not on file  . Number of children: Not on file  . Years of education: Not  on file  . Highest education level: Not on file  Occupational History  . Not on file  Tobacco Use  . Smoking status: Never Smoker  . Smokeless tobacco: Never Used  Substance and Sexual Activity  . Alcohol use: Never  . Drug use: Never  . Sexual activity: Never  Other Topics Concern  . Not on file  Social History Narrative   ** Merged History Encounter **       Social Determinants of Health   Financial Resource Strain:   . Difficulty of Paying Living Expenses:   Food Insecurity:   . Worried About Charity fundraiser in the Last Year:   . Arboriculturist in the Last Year:   Transportation Needs:   . Film/video editor (Medical):   Marland Kitchen Lack of Transportation (Non-Medical):   Physical Activity:   . Days of Exercise per Week:   . Minutes of Exercise per Session:   Stress:   . Feeling of Stress :   Social Connections:   . Frequency of Communication with Friends and Family:   . Frequency of Social Gatherings with Friends and Family:   . Attends Religious Services:   . Active Member of Clubs or Organizations:   . Attends Archivist Meetings:   Marland Kitchen Marital Status:    Additional Social History:                         Sleep: Fair  Appetite:  Good  Current Medications:  Current Facility-Administered Medications  Medication Dose Route Frequency Provider Last Rate Last Admin  . acetaminophen (TYLENOL) tablet 650 mg  650 mg Oral Q6H PRN Nira Conn A, NP      . alum & mag hydroxide-simeth (MAALOX/MYLANTA) 200-200-20 MG/5ML suspension 30 mL  30 mL Oral Q4H PRN Nira Conn A, NP      . ARIPiprazole (ABILIFY) tablet 10 mg  10 mg Oral Daily Antonieta Pert, MD   10 mg at 03/27/20 0758  . hydrOXYzine (ATARAX/VISTARIL) tablet 25 mg  25 mg Oral TID PRN Nira Conn A, NP      . ziprasidone (GEODON) injection 10 mg  10 mg Intramuscular Q12H PRN Jackelyn Poling, NP       And  . LORazepam (ATIVAN) tablet 1 mg  1 mg Oral PRN Nira Conn A, NP      . OLANZapine  zydis (ZYPREXA) disintegrating tablet 10 mg  10 mg Oral Q8H PRN Antonieta Pert, MD       And  . LORazepam (ATIVAN) tablet 1 mg  1 mg Oral PRN Antonieta Pert, MD       And  . ziprasidone (GEODON) injection 20 mg  20 mg Intramuscular PRN Antonieta Pert, MD      . magnesium hydroxide (MILK OF MAGNESIA) suspension 30 mL  30 mL Oral Daily PRN Nira Conn A, NP      . risperiDONE (RISPERDAL M-TABS) disintegrating tablet 4 mg  4 mg Oral QHS Antonieta Pert, MD   4 mg at 03/25/20 2056  . SUMAtriptan (IMITREX) tablet 50 mg  50 mg Oral Q2H PRN Antonieta Pert, MD        Lab Results:  Results for orders placed or performed during the hospital encounter of 03/25/20 (from the past 48 hour(s))  Urinalysis, Complete w Microscopic     Status: Abnormal   Collection Time: 03/25/20  2:45 PM  Result Value Ref Range   Color, Urine YELLOW YELLOW   APPearance CLOUDY (A) CLEAR   Specific Gravity, Urine 1.011 1.005 - 1.030   pH 5.0 5.0 - 8.0   Glucose, UA NEGATIVE NEGATIVE mg/dL   Hgb urine dipstick NEGATIVE NEGATIVE   Bilirubin Urine NEGATIVE NEGATIVE   Ketones, ur 5 (A) NEGATIVE mg/dL   Protein, ur NEGATIVE NEGATIVE mg/dL   Nitrite NEGATIVE NEGATIVE   Leukocytes,Ua MODERATE (A) NEGATIVE   RBC / HPF 6-10 0 - 5 RBC/hpf   WBC, UA 21-50 0 - 5 WBC/hpf   Bacteria, UA FEW (A) NONE SEEN   Squamous Epithelial / LPF 21-50 0 - 5   Mucus PRESENT     Comment: Performed at [facility], [address], [place], [place] [address]  Hemoglobin A1c     Status: Abnormal   Collection Time: 03/26/20  6:15 AM  Result Value Ref Range   Hgb A1c MFr Bld 6.6 (H) 4.8 - 5.6 %    Comment: (NOTE) Pre diabetes:          5.7%-6.4% Diabetes:              >6.4% Glycemic control for   <7.0% adults with diabetes    Mean Plasma Glucose 142.72 mg/dL    Comment: Performed at [facility], [address], [place], [place] [address]  Lipid panel     Status: Abnormal   Collection Time:  03/26/20  6:15 AM  Result Value Ref Range   Cholesterol 173 0 - 200 mg/dL  Triglycerides 67 <150 mg/dL   HDL 47 >68 mg/dL   Total CHOL/HDL Ratio 3.7 RATIO   VLDL 13 0 - 40 mg/dL   LDL Cholesterol 032 (H) 0 - 99 mg/dL    Comment:        Total Cholesterol/HDL:CHD Risk Coronary Heart Disease Risk Table                     Men   Women  1/2 Average Risk   3.4   3.3  Average Risk       5.0   4.4  2 X Average Risk   9.6   7.1  3 X Average Risk  23.4   11.0        Use the calculated Patient Ratio above and the CHD Risk Table to determine the patient's CHD Risk.        ATP III CLASSIFICATION (LDL):  <100     mg/dL   Optimal  122-482  mg/dL   Near or Above                    Optimal  130-159  mg/dL   Borderline  500-370  mg/dL   High  >488     mg/dL   Very High Performed at [facility], [address] [place], [place] [address]   TSH     Status: None   Collection Time: 03/26/20  6:15 AM  Result Value Ref Range   TSH 1.219 0.350 - 4.500 uIU/mL    Comment: Performed by a 3rd Generation assay with a functional sensitivity of <=0.01 uIU/mL. Performed at [facility], [address], [place], [place] [address]     Blood Alcohol level:  Lab Results  Component Value Date   ETH <10 03/24/2020   ETH <10 03/20/2020    Metabolic Disorder Labs: Lab Results  Component Value Date   HGBA1C 6.6 (H) 03/26/2020   MPG 142.72 03/26/2020   No results found for: PROLACTIN Lab Results  Component Value Date   CHOL 173 03/26/2020   TRIG 67 03/26/2020   HDL 47 03/26/2020   CHOLHDL 3.7 03/26/2020   VLDL 13 03/26/2020   LDLCALC 113 (H) 03/26/2020    Physical Findings: AIMS: Facial and Oral Movements Muscles of Facial Expression: None, normal Lips and Perioral Area: None, normal Jaw: None, normal Tongue: None, normal,Extremity Movements Upper (arms, wrists, hands, fingers): None, normal Lower (legs, knees, ankles, toes): None, normal, Trunk  Movements Neck, shoulders, hips: None, normal, Overall Severity Severity of abnormal movements (highest score from questions above): None, normal Incapacitation due to abnormal movements: None, normal Patient's awareness of abnormal movements (rate only patient's report): No Awareness, Dental Status Current problems with teeth and/or dentures?: No Does patient usually wear dentures?: No  CIWA:    COWS:     Musculoskeletal: Strength & Muscle Tone: within normal limits Gait & Station: normal Patient leans: N/A  Psychiatric Specialty Exam: Physical Exam  Nursing note and vitals reviewed. Constitutional: She is oriented to person, place, and time. She appears well-developed and well-nourished.  HENT:  Head: Normocephalic and atraumatic.  Respiratory: Effort normal.  Neurological: She is alert and oriented to person, place, and time.    Review of Systems  Blood pressure (!) 141/90, pulse 75, temperature 99.1 F (37.3 C), temperature source Oral, resp. rate 18, height 5\' 1"  (1.549 m), weight 68.9 kg, SpO2 100 %.Body mass index is 28.72 kg/m.  General Appearance: Casual  Eye Contact:  Fair  Speech:  Normal Rate  Volume:  Normal  Mood:  Euthymic  Affect:  Congruent  Thought Process:  Coherent and Descriptions of Associations: Intact  Orientation:  Full (Time, Place, and Person)  Thought Content:  Logical  Suicidal Thoughts:  No  Homicidal Thoughts:  No  Memory:  Immediate;   Fair Recent;   Fair Remote;   Fair  Judgement:  Intact  Insight:  Fair  Psychomotor Activity:  Normal  Concentration:  Concentration: Good and Attention Span: Good  Recall:  Good  Fund of Knowledge:  Fair  Language:  Good  Akathisia:  Negative  Handed:  Right  AIMS (if indicated):     Assets:  Desire for Improvement Resilience  ADL's:  Intact  Cognition:  WNL  Sleep:  Number of Hours: 5.75     Treatment Plan Summary: Daily contact with patient to assess and evaluate symptoms and progress in  treatment, Medication management and Plan : Patient is seen and examined.  Patient is a 44 year old female with the above-stated past psychiatric history who is seen in follow-up.   Diagnosis: #1 schizophrenia, #2 migraine headaches  Patient is seen in follow-up.  She took her long-acting Abilify injection yesterday.  She is doing well.  I will talk to social work about housing given her behavior that led to her being admitted here the first place.  She may not have an apartment to go back to.  No changes in her current medications.  1.  Continue Abilify 10 mg p.o. daily for several more days for psychosis. 2.  Patient received the Abilify long-acting injection 400 mg IM x1 on 03/26/2020 for psychosis. 3.  Continue Risperdal 4 mg p.o. nightly for psychosis. 4.  Continue sumatriptan 50 mg p.o. every 2 hours as needed migraine headache. 5.  Discussed with social work housing situation. 6.  Disposition planning-in progress.  Antonieta Pert, MD 03/27/2020, 11:25 AM

## 2020-03-27 NOTE — Progress Notes (Signed)
   03/27/20 0449  Psych Admission Type (Psych Patients Only)  Admission Status Voluntary  Psychosocial Assessment  Patient Complaints None  Eye Contact Fair  Facial Expression Other (Comment) (WNL)  Affect Appropriate to circumstance  Speech Logical/coherent  Interaction Assertive  Motor Activity Slow  Appearance/Hygiene In scrubs  Behavior Characteristics Cooperative  Mood Pleasant  Thought Process  Coherency WDL  Content WDL  Delusions None reported or observed  Perception WDL  Judgment WDL  Confusion None  Danger to Self  Current suicidal ideation? Denies  Danger to Others  Danger to Others None reported or observed   Pt has slept majority of shift. Seen at nurse's station asking for something to drink. Pt informed that her Abilify can have the side effect of sleepiness. Pt c/o soreness at injection site. Pt advised to exercise arm to work out soreness. Pt went back to room to lie down.

## 2020-03-27 NOTE — Progress Notes (Signed)
Recreation Therapy Notes  Date: 5.6.21 Time: 0930-1020 Location: [address] Dayroom  Group Topic: Self-Esteem  Goal Area(s) Addresses:  Patient will successfully identify positive attributes about themselves.  Patient will successfully identify benefit of improved self-esteem.   Behavioral Response: Engaged  Intervention: Scientist, clinical (histocompatibility and immunogenetics), scissors, glue sticks, magazines  Activity: Collage About Me.  Patients were to use the magazines to find words and pictures the highlight things they like, things they want to try, things they have accomplished or things that inspire them.  Patients would then glue the things they found onto the construction paper.  Education:  Self-Esteem, Building control surveyor.   Education Outcome: Acknowledges education/In group clarification offered/Needs additional education  Clinical Observations/Feedback: Pt found a picture that represented her love of reading, a picture of a dog to represent her [place] terrier and a picture of shrimp and grits because she likes them.  Pt also expressed a love of baths and playing Guitar Hero.  Pt stated always was her favorite word and green is her favorite word.       Caroll Rancher, LRT/CTRS    Caroll Rancher A 03/27/2020 10:34 AM

## 2020-03-27 NOTE — [facility] Counselor (Signed)
CSW attempted to reach Fleet Contras, Radiation protection practitioner, with [facility]. CSW left a detailed HIPAA compliant voicemail to inquire about bed availability and requested a returned call.  Enid Cutter, MSW, LCSW-A Clinical Social Worker [facility]

## 2020-03-27 NOTE — Progress Notes (Addendum)
   03/27/20 2045  Psych Admission Type (Psych Patients Only)  Admission Status Voluntary  Psychosocial Assessment  Patient Complaints None  Eye Contact Fair  Facial Expression Other (Comment) (WNL)  Affect Appropriate to circumstance  Speech Logical/coherent  Interaction Assertive  Motor Activity Slow  Appearance/Hygiene In scrubs;Unremarkable  Behavior Characteristics Cooperative  Mood Pleasant  Thought Process  Coherency WDL  Content WDL  Delusions None reported or observed  Perception WDL  Hallucination None reported or observed  Judgment WDL  Confusion None  Danger to Self  Current suicidal ideation? Denies  Danger to Others  Danger to Others None reported or observed  Pt seen at med window. No complaints. Reported migraine headache earlier to day shift RN. Given dose of Imitrex. Pt reporting pain 0/10 now.

## 2020-03-27 NOTE — Progress Notes (Signed)
   03/27/20 0607  Vital Signs  Temp 99.1 F (37.3 C)  Temp Source Oral  Pulse Rate 75  BP (!) 141/90  BP Location Right Arm  BP Method Automatic  Patient Position (if appropriate) Standing  D: Patient denies SI/HI/AVH. Patient  Presents with a depressed affect.  Patient took all morning medicine. Patient complained of a headache 5/10,  50 mg of Imitrex was given.   A:  Patient reported that her headache was relieved by the Imitrex. Support and encouragement provided. Routine safety checks conducted every 15 minutes. Patient to inform  staff with any concerns.  R:  No adverse drug reactions noted.  Patient contracts for safety.  Patient compliant with medication and treatment plan. Patient cooperative and calm. PSafety maintained.

## 2020-03-28 MED ORDER — ARIPIPRAZOLE 15 MG PO TABS
15.0000 mg | ORAL_TABLET | Freq: Every day | ORAL | Status: DC
  Administered 2020-03-29 – 2020-03-31 (×3): 15 mg via ORAL
  Filled 2020-03-28: qty 1
  Filled 2020-03-28: qty 7
  Filled 2020-03-28 (×3): qty 1

## 2020-03-28 NOTE — Progress Notes (Signed)
[facility] MD Progress Note  03/28/2020 11:12 AM Stephanie [name]  MRN:  [id number] Subjective:  Patient is a 44 year old female with a past psychiatric history significant for schizophrenia who presented to the [facility] emergency department on 03/24/2020 after tearing up her apartment, showing evidence of psychosis.  Objective: Patient is seen and examined.  Patient is a 44 year old female with the above-stated past psychiatric history is seen in follow-up.  On initial evaluation she is pleasant, but when she finds out that she is not being discharged today she becomes irritated.  Some of her baseline delusions came out.  She stated that she has a mortgage, too expensive dogs, and a car that she needs to get to.  She stated that she needs to get it taken to [facility] where all of her things are" hospitalist to be tickets to places, that is how I got to [facility]".  She stated that one doctors told her that she does not have psychiatric illness but I told her that she does.  She is unhappy about that.  Social work was able to collect some information from [employer].  We were unable to make contact with them this morning.  We are also looking into [facility] ending homelessness given her discharge plans.  Her vital signs are stable, she is afebrile.  He slept 5.75 hours last night.  She denied any auditory or visual hallucinations.  She denied any suicidal or homicidal ideation.  She does have fixed delusions.  Principal Problem: <principal problem not specified> Diagnosis: Active Problems:   Schizoaffective disorder (HCC)  Total Time spent with patient: 20 minutes  Past Psychiatric History: See admission H&P  Past Medical History:  Past Medical History:  Diagnosis Date  . Schizophrenia [facility])     Past Surgical History:  Procedure Laterality Date  . INDUCED ABORTION     Family History: History reviewed. No pertinent family history. Family Psychiatric  History: See  admission H&P Social History:  Social History   Substance and Sexual Activity  Alcohol Use Never     Social History   Substance and Sexual Activity  Drug Use Never    Social History   Socioeconomic History  . Marital status: Unknown    Spouse name: Not on file  . Number of children: Not on file  . Years of education: Not on file  . Highest education level: Not on file  Occupational History  . Not on file  Tobacco Use  . Smoking status: Never Smoker  . Smokeless tobacco: Never Used  Substance and Sexual Activity  . Alcohol use: Never  . Drug use: Never  . Sexual activity: Never  Other Topics Concern  . Not on file  Social History Narrative   ** Merged History Encounter **       Social Determinants of Health   Financial Resource Strain:   . Difficulty of Paying Living Expenses:   Food Insecurity:   . Worried About Charity fundraiser in the Last Year:   . Arboriculturist in the Last Year:   Transportation Needs:   . Film/video editor (Medical):   Marland Kitchen Lack of Transportation (Non-Medical):   Physical Activity:   . Days of Exercise per Week:   . Minutes of Exercise per Session:   Stress:   . Feeling of Stress :   Social Connections:   . Frequency of Communication with Friends and Family:   . Frequency of Social Gatherings with Friends  and Family:   . Attends Religious Services:   . Active Member of Clubs or Organizations:   . Attends Banker Meetings:   Marland Kitchen Marital Status:    Additional Social History:                         Sleep: Good  Appetite:  Good  Current Medications: Current Facility-Administered Medications  Medication Dose Route Frequency Provider Last Rate Last Admin  . acetaminophen (TYLENOL) tablet 650 mg  650 mg Oral Q6H PRN Nira Conn A, NP   650 mg at 03/28/20 0804  . alum & mag hydroxide-simeth (MAALOX/MYLANTA) 200-200-20 MG/5ML suspension 30 mL  30 mL Oral Q4H PRN Nira Conn A, NP      . ARIPiprazole  (ABILIFY) tablet 10 mg  10 mg Oral Daily Antonieta Pert, MD   10 mg at 03/28/20 0801  . hydrOXYzine (ATARAX/VISTARIL) tablet 25 mg  25 mg Oral TID PRN Nira Conn A, NP      . ziprasidone (GEODON) injection 10 mg  10 mg Intramuscular Q12H PRN Jackelyn Poling, NP       And  . LORazepam (ATIVAN) tablet 1 mg  1 mg Oral PRN Nira Conn A, NP      . OLANZapine zydis (ZYPREXA) disintegrating tablet 10 mg  10 mg Oral Q8H PRN Antonieta Pert, MD       And  . LORazepam (ATIVAN) tablet 1 mg  1 mg Oral PRN Antonieta Pert, MD       And  . ziprasidone (GEODON) injection 20 mg  20 mg Intramuscular PRN Antonieta Pert, MD      . magnesium hydroxide (MILK OF MAGNESIA) suspension 30 mL  30 mL Oral Daily PRN Nira Conn A, NP      . risperiDONE (RISPERDAL M-TABS) disintegrating tablet 4 mg  4 mg Oral QHS Antonieta Pert, MD   4 mg at 03/27/20 2045  . SUMAtriptan (IMITREX) tablet 50 mg  50 mg Oral Q2H PRN Antonieta Pert, MD   50 mg at 03/27/20 1139    Lab Results: No results found for this or any previous visit (from the past 48 hour(s)).  Blood Alcohol level:  Lab Results  Component Value Date   ETH <10 03/24/2020   ETH <10 03/20/2020    Metabolic Disorder Labs: Lab Results  Component Value Date   HGBA1C 6.6 (H) 03/26/2020   MPG 142.72 03/26/2020   No results found for: PROLACTIN Lab Results  Component Value Date   CHOL 173 03/26/2020   TRIG 67 03/26/2020   HDL 47 03/26/2020   CHOLHDL 3.7 03/26/2020   VLDL 13 03/26/2020   LDLCALC 113 (H) 03/26/2020    Physical Findings: AIMS: Facial and Oral Movements Muscles of Facial Expression: None, normal Lips and Perioral Area: None, normal Jaw: None, normal Tongue: None, normal,Extremity Movements Upper (arms, wrists, hands, fingers): None, normal Lower (legs, knees, ankles, toes): None, normal, Trunk Movements Neck, shoulders, hips: None, normal, Overall Severity Severity of abnormal movements (highest score from  questions above): None, normal Incapacitation due to abnormal movements: None, normal Patient's awareness of abnormal movements (rate only patient's report): No Awareness, Dental Status Current problems with teeth and/or dentures?: No Does patient usually wear dentures?: No  CIWA:    COWS:     Musculoskeletal: Strength & Muscle Tone: within normal limits Gait & Station: normal Patient leans: N/A  Psychiatric Specialty Exam: Physical Exam  Nursing  note and vitals reviewed. Constitutional: She is oriented to person, place, and time. She appears well-developed and well-nourished.  HENT:  Head: Normocephalic and atraumatic.  Respiratory: Effort normal.  Neurological: She is alert and oriented to person, place, and time.    Review of Systems  Blood pressure 123/69, pulse 81, temperature 98.7 F (37.1 C), temperature source Oral, resp. rate 18, height 5\' 1"  (1.549 m), weight 68.9 kg, SpO2 100 %.Body mass index is 28.72 kg/m.  General Appearance: Casual  Eye Contact:  Fair  Speech:  Normal Rate  Volume:  Increased  Mood:  Irritable  Affect:  Congruent  Thought Process:  Coherent and Descriptions of Associations: Loose  Orientation:  Full (Time, Place, and Person)  Thought Content:  Delusions  Suicidal Thoughts:  No  Homicidal Thoughts:  No  Memory:  Immediate;   Fair Recent;   Fair Remote;   Fair  Judgement:  Impaired  Insight:  Lacking  Psychomotor Activity:  Increased  Concentration:  Concentration: Fair and Attention Span: Fair  Recall:  of Knowledge:  Fair  Language:  Good  Akathisia:  Negative  Handed:  Right  AIMS (if indicated):     Assets:  Desire for Improvement Resilience  ADL's:  Intact  Cognition:  WNL  Sleep:  Number of Hours: 5.75     Treatment Plan Summary: Daily contact with patient to assess and evaluate symptoms and progress in treatment, Medication management and Plan : Patient is seen and examined.  Patient is a 44 year old female  with the above-stated past psychiatric history who is seen in follow-up.   Diagnosis #1 schizoaffective disorder; bipolar type, #2 migraine headaches  Patient is seen in follow-up.  She is polite and pleasant until she finds out that she is not being able to be discharged.  She found out that she would not be given a ticket to get to [place].  Some of her face delusions about owning a house, expensive dogs and cars became more prominent.  She then stated that there was a doctor who who worked "downstairs" who told that she does not have a mental illness, but I have told her that she has mental illness and she is upset about that.  She stated she does not want to take medications for the rest of her life.  She received the Abilify long-acting injection on 03/26/2020.  She continues on oral Abilify during the day.  I will increase that to 15 mg p.o. daily.  We will continue the Risperdal 4 mg p.o. nightly, but if she continues to get agitated that may have to be adjusted as well.  Housing continues to be a primary issue with her discharge plan.  1.  Increase Abilify to 15 mg p.o. daily for psychosis. 2.  Continue hydroxyzine 25 mg p.o. 3 times daily as needed anxiety. 3.  Continue agitation protocol with olanzapine/lorazepam or Geodon as needed for agitation. 4.  Continue Risperdal 4 mg p.o. nightly for psychosis. 5.  Continue sumatriptan 50 mg p.o. every 2 hours as needed migraine headache. 6.  Disposition planning-in progress.  05/26/2020, MD 03/28/2020, 11:12 AM

## 2020-03-28 NOTE — Progress Notes (Signed)
Patient asleep in bed majority of evening.   Denies SI, HI, AVH and pain at this time. Pleasant and cooperative on approach. Compliant with medications this shift. Denies any issues or concerns this evening. Pt remains on Q 15 minutes safety checks and without self harm gestures. Writer encouraged pt to voice concerns. Pt remains safe on unit.

## 2020-03-28 NOTE — Progress Notes (Signed)
Recreation Therapy Notes  Date: 5.7.21 Time: 1000 Location: 500 Hall Dayroom  Group Topic: Communication, Team Building, Problem Solving  Goal Area(s) Addresses:  Patient will effectively work with peer towards shared goal.  Patient will identify skill used to make activity successful.  Patient will identify how skills used during activity can be used to reach post d/c goals.   Behavioral Response: Engaged  Intervention: STEM Activity   Activity: Straw Bridge.  In groups, patients were given 15 straws and 96ft masking tape.  Patients were to build a freestanding bridge that can hold a small puzzle box without falling over.  Education: Pharmacist, community, Building control surveyor.   Education Outcome: Acknowledges education  Clinical Observations/Feedback:  Pt worked quietly and on her own to construct a bridge that would hold the puzzle box.  Pt was engaged and on task throughout group session.    Caroll Rancher, LRT/CTRS    Lillia Abed, Ichiro Chesnut A 03/28/2020 11:40 AM

## 2020-03-28 NOTE — Progress Notes (Signed)
Patient did not attend group because she was asleep.  

## 2020-03-28 NOTE — BHH Counselor (Signed)
CSW attempted to reach housing assistance staff at [employer] again without success. CSW was able to leave a detailed voicemail for the Permanent Housing Program.  Fleet Contras from [facility] reports there is no Loss adjuster, chartered.  Enid Cutter, MSW, LCSW-A Clinical Social Worker [facility] Adult Unit

## 2020-03-28 NOTE — Progress Notes (Signed)
   03/28/20 1300  Psych Admission Type (Psych Patients Only)  Admission Status Voluntary  Psychosocial Assessment  Patient Complaints None  Eye Contact Fair  Facial Expression Other (Comment) (WNL)  Affect Appropriate to circumstance  Speech Logical/coherent  Interaction Assertive  Motor Activity Slow  Appearance/Hygiene In scrubs;Unremarkable  Behavior Characteristics Cooperative  Mood Pleasant  Thought Process  Coherency WDL  Content WDL  Delusions None reported or observed  Perception WDL  Hallucination None reported or observed  Judgment WDL  Confusion None  Danger to Self  Current suicidal ideation? Denies  Danger to Others  Danger to Others None reported or observed

## 2020-03-28 NOTE — BHH Counselor (Signed)
CSW spoke with patient at bedside regarding discharge and disposition.   There is no current shelter availability through [facility]. Patient expresses interest in being discharged and reports she plans to "go back where I came from." Patient nor CSW has spoken with the apartment management company. CSW granted permission to make contact for discharge planning.  Patient reports that she has an apartment [address] (not a complex), that is paid for through [employer]. Patient reports she has worked with "Ms.Brooks," at [employer]. CSW attempted to contact [employer] multiple times without success. Ms.Brooks cannot be located by [employer], CSW was unable to reach staff through [employer], [employer], [employer], or the [employer].  CSW continuing to follow.  Enid Cutter, MSW, LCSW-A Clinical Social Worker [facility] Adult Unit

## 2020-03-28 NOTE — Progress Notes (Signed)
SPIRITUALITY GROUP NOTE  Spirituality group facilitated by Wilkie Aye, MDiv, BCC.  Group Description:  Group focused on topic of hope.  Patients participated in facilitated discussion around topic, connecting with one another around experiences and definitions for hope.  Group members engaged with visual explorer photos, reflecting on what hope looks like for them today.  Group engaged in discussion around how their definitions of hope are present today in hospital.   Modalities: Psycho-social ed, Adlerian, Narrative, MI Patient Progress: Present in group.  Left for half of group time, but returned and engaged in discussion.  She noted that she uses prescription glasses and does not have these - they are in [place] - so she cannot see the board

## 2020-03-29 NOTE — Progress Notes (Signed)
   03/29/20 2130  Psych Admission Type (Psych Patients Only)  Admission Status Voluntary  Psychosocial Assessment  Patient Complaints None  Eye Contact Fair  Facial Expression Other (Comment) (WNL)  Affect Appropriate to circumstance  Speech Logical/coherent  Interaction Assertive  Motor Activity Slow  Appearance/Hygiene In scrubs;Unremarkable  Behavior Characteristics Cooperative;Appropriate to situation  Mood Pleasant  Thought Process  Coherency WDL  Content WDL  Delusions None reported or observed  Perception WDL  Hallucination None reported or observed  Judgment WDL  Confusion None  Danger to Self  Current suicidal ideation? Denies  Danger to Others  Danger to Others None reported or observed  Danger to Others Abnormal  Harmful Behavior to others No threats or harm toward other people  Destructive Behavior No threats or harm toward property

## 2020-03-29 NOTE — BHH Group Notes (Signed)
  BHH/BMU LCSW Group Therapy Note  Date/Time:  03/29/2020 11:15AM-12:00PM  Type of Therapy and Topic:  Group Therapy:  Feelings About Hospitalization  Participation Level:  Active   Description of Group This process group involved patients discussing their feelings related to being hospitalized, as well as the benefits they see to being in the hospital.  These feelings and benefits were itemized.  The group then brainstormed specific ways in which they could seek those same benefits when they discharge and return home.  Therapeutic Goals 1. Patient will identify and describe positive and negative feelings related to hospitalization 2. Patient will verbalize benefits of hospitalization to themselves personally 3. Patients will brainstorm together ways they can obtain similar benefits in the outpatient setting, identify barriers to wellness and possible solutions  Summary of Patient Progress:  The patient actively engaged in introductory check-in, sharing of feeling "Happy, and overjoyed this morning". Pt expressed her primary feelings about being hospitalized are gratitude for various reasons of which she confirmed CSW clarification of her referencing support, stability, and structure. Pt further acknowledged the benefits received from consulting with doctors, nurses, and other professionals that have been working to assist her needs promptly. Pt was removed for MD consultation. Upon return to group, pt proved receptive to groups exploration of abilities to obtain supports in community settings, acknowledging prior experience with community supports, and asking clarifying questions surrounding additional supports she believes she would benefit from. Pt proved receptive to alternate group members input and CSW feedback.  Therapeutic Modalities Cognitive Behavioral Therapy Motivational Interviewing    Micheline Maze 03/29/2020  12:49 PM

## 2020-03-29 NOTE — BHH Group Notes (Signed)
Adult Psychoeducational Group Note  Date:  03/29/2020 Time:  12:33 PM  Group Topic/Focus:  Goals Group:   The focus of this group is to help patients establish daily goals to achieve during treatment and discuss how the patient can incorporate goal setting into their daily lives to aide in recovery.  Participation Level:  Did Not Attend   Dione Housekeeper 03/29/2020, 12:33 PM

## 2020-03-29 NOTE — Progress Notes (Signed)
[facility] MD Progress Note  03/29/2020 12:15 PM Stephanie [name]  MRN:  [id number] Subjective:  Patient is a 44 year old female with a past psychiatric history significant for schizophrenia who presented to the [facility] emergency department on 03/24/2020 after tearing up her apartment, showing evidence of psychosis. Principal Problem: <principal problem not specified>   Objective: Patient is seen and examined.  Patient is a 44 year old female with the above-stated past psychiatric history seen in follow-up.  She is much better today.  Much less irritable.  She denied any auditory or visual hallucinations.  Her delusions appear to be stable at this point.  She has also agreed to allow [place] to talk to the [employer] folks to try and find her housing.  Social work is working on that.  She denied any side effects to her current medications.  Her vital signs are stable, she is afebrile.  He slept approximately 10 hours last night.  No new laboratories.  Diagnosis: Active Problems:   Schizoaffective disorder ([place])  Total Time spent with patient: 15 minutes  Past Psychiatric History: The admission H&P  Past Medical History:  Past Medical History:  Diagnosis Date  . Schizophrenia [facility])     Past Surgical History:  Procedure Laterality Date  . INDUCED ABORTION     Family History: History reviewed. No pertinent family history. Family Psychiatric  History: See admission H&P Social History:  Social History   Substance and Sexual Activity  Alcohol Use Never     Social History   Substance and Sexual Activity  Drug Use Never    Social History   Socioeconomic History  . Marital status: Unknown    Spouse name: Not on file  . Number of children: Not on file  . Years of education: Not on file  . Highest education level: Not on file  Occupational History  . Not on file  Tobacco Use  . Smoking status: Never Smoker  . Smokeless tobacco: Never Used  Substance and Sexual  Activity  . Alcohol use: Never  . Drug use: Never  . Sexual activity: Never  Other Topics Concern  . Not on file  Social History Narrative   ** Merged History Encounter **       Social Determinants of Health   Financial Resource Strain:   . Difficulty of Paying Living Expenses:   Food Insecurity:   . Worried About Charity fundraiser in the Last Year:   . Arboriculturist in the Last Year:   Transportation Needs:   . Film/video editor (Medical):   Marland Kitchen Lack of Transportation (Non-Medical):   Physical Activity:   . Days of Exercise per Week:   . Minutes of Exercise per Session:   Stress:   . Feeling of Stress :   Social Connections:   . Frequency of Communication with Friends and Family:   . Frequency of Social Gatherings with Friends and Family:   . Attends Religious Services:   . Active Member of Clubs or Organizations:   . Attends Archivist Meetings:   Marland Kitchen Marital Status:    Additional Social History:                         Sleep: Good  Appetite:  Good  Current Medications: Current Facility-Administered Medications  Medication Dose Route Frequency Provider Last Rate Last Admin  . acetaminophen (TYLENOL) tablet 650 mg  650 mg Oral Q6H PRN Lindon Romp  A, NP   650 mg at 03/28/20 1406  . alum & mag hydroxide-simeth (MAALOX/MYLANTA) 200-200-20 MG/5ML suspension 30 mL  30 mL Oral Q4H PRN Nira Conn A, NP   30 mL at 03/28/20 1700  . ARIPiprazole (ABILIFY) tablet 15 mg  15 mg Oral Daily Antonieta Pert, MD   15 mg at 03/29/20 0825  . hydrOXYzine (ATARAX/VISTARIL) tablet 25 mg  25 mg Oral TID PRN Nira Conn A, NP      . ziprasidone (GEODON) injection 10 mg  10 mg Intramuscular Q12H PRN Jackelyn Poling, NP       And  . LORazepam (ATIVAN) tablet 1 mg  1 mg Oral PRN Nira Conn A, NP      . magnesium hydroxide (MILK OF MAGNESIA) suspension 30 mL  30 mL Oral Daily PRN Nira Conn A, NP      . OLANZapine zydis (ZYPREXA) disintegrating tablet 10 mg   10 mg Oral Q8H PRN Antonieta Pert, MD   10 mg at 03/28/20 1406   And  . ziprasidone (GEODON) injection 20 mg  20 mg Intramuscular PRN Antonieta Pert, MD      . risperiDONE (RISPERDAL M-TABS) disintegrating tablet 4 mg  4 mg Oral QHS Antonieta Pert, MD   4 mg at 03/28/20 2128  . SUMAtriptan (IMITREX) tablet 50 mg  50 mg Oral Q2H PRN Antonieta Pert, MD   50 mg at 03/28/20 1406    Lab Results: No results found for this or any previous visit (from the past 48 hour(s)).  Blood Alcohol level:  Lab Results  Component Value Date   ETH <10 03/24/2020   ETH <10 03/20/2020    Metabolic Disorder Labs: Lab Results  Component Value Date   HGBA1C 6.6 (H) 03/26/2020   MPG 142.72 03/26/2020   No results found for: PROLACTIN Lab Results  Component Value Date   CHOL 173 03/26/2020   TRIG 67 03/26/2020   HDL 47 03/26/2020   CHOLHDL 3.7 03/26/2020   VLDL 13 03/26/2020   LDLCALC 113 (H) 03/26/2020    Physical Findings: AIMS: Facial and Oral Movements Muscles of Facial Expression: None, normal Lips and Perioral Area: None, normal Jaw: None, normal Tongue: None, normal,Extremity Movements Upper (arms, wrists, hands, fingers): None, normal Lower (legs, knees, ankles, toes): None, normal, Trunk Movements Neck, shoulders, hips: None, normal, Overall Severity Severity of abnormal movements (highest score from questions above): None, normal Incapacitation due to abnormal movements: None, normal Patient's awareness of abnormal movements (rate only patient's report): No Awareness, Dental Status Current problems with teeth and/or dentures?: No Does patient usually wear dentures?: No  CIWA:    COWS:     Musculoskeletal: Strength & Muscle Tone: within normal limits Gait & Station: normal Patient leans: N/A  Psychiatric Specialty Exam: Physical Exam  Nursing note and vitals reviewed. Constitutional: She is oriented to person, place, and time. She appears well-developed and  well-nourished.  HENT:  Head: Normocephalic and atraumatic.  Respiratory: Effort normal.  Neurological: She is alert and oriented to person, place, and time.    Review of Systems  Blood pressure 125/84, pulse 78, temperature 98.1 F (36.7 C), temperature source Oral, resp. rate 18, height 5\' 1"  (1.549 m), weight 68.9 kg, SpO2 100 %.Body mass index is 28.72 kg/m.  General Appearance: Casual  Eye Contact:  Fair  Speech:  Normal Rate  Volume:  Normal  Mood:  Euthymic  Affect:  Congruent  Thought Process:  Coherent and Descriptions of  Associations: Intact  Orientation:  Full (Time, Place, and Person)  Thought Content:  Delusions  Suicidal Thoughts:  No  Homicidal Thoughts:  No  Memory:  Immediate;   Fair Recent;   Fair Remote;   Fair  Judgement:  Intact  Insight:  Fair  Psychomotor Activity:  Normal  Concentration:  Concentration: Fair and Attention Span: Fair  Recall:  [employer] of Knowledge:  Fair  Language:  Fair  Akathisia:  Negative  Handed:  Right  AIMS (if indicated):     Assets:  Desire for Improvement Resilience  ADL's:  Intact  Cognition:  WNL  Sleep:  Number of Hours: 10.5     Treatment Plan Summary: Daily contact with patient to assess and evaluate symptoms and progress in treatment, Medication management and Plan : Patient is seen and examined.  Patient is a 44 year old female with the above-stated past psychiatric history who is seen in follow-up.   Diagnosis #1 schizoaffective disorder; bipolar type, #2 migraine headaches  Patient is seen in follow-up.  She is doing better today.  The increased dose of oral Abilify as well as allowing the injectable Abilify to get into her system appears to be finally working.  She has agreed to work with [employer] for housing.  I will discuss this with social work.  No change in her medications, and as soon as the housing issue was addressed we will hopefully be able to get her out of the hospital.  1.   Continue Abilify 15 mg p.o. daily for psychosis. 2.  Continue hydroxyzine 25 mg p.o. 3 times daily as needed anxiety. 3.  Continue Zyprexa/lorazepam/Geodon agitation protocol as needed. 4.  Continue Risperdal 4 mg p.o. nightly for psychosis. 5.  Continue sumatriptan 50 mg p.o. every 2 hours as needed migraine headaches. 6.  Continue Geodon 20 mg IM as needed agitation. 7.  Disposition planning-in progress. Antonieta Pert, MD 03/29/2020, 12:15 PM

## 2020-03-29 NOTE — Progress Notes (Signed)
D. Pt has been calm and cooperative on the unit- visible in the milieu interacting well with peers and staff. Per pts self inventory, pt rated her depression, hopelessness and anxiety all 0's. Pt writes that her goal today is to work on her "release plan" Pt currently denies SI/HI and AVH  A. Labs and vitals monitored. Pt given and educated on medications. Pt supported emotionally and encouraged to express concerns and ask questions.   R. Pt remains safe with 15 minute checks. Will continue POC.

## 2020-03-30 MED ORDER — CARBAMAZEPINE 100 MG PO CHEW
100.0000 mg | CHEWABLE_TABLET | Freq: Three times a day (TID) | ORAL | Status: DC
  Administered 2020-03-30 – 2020-03-31 (×4): 100 mg via ORAL
  Filled 2020-03-30 (×2): qty 1
  Filled 2020-03-30: qty 21
  Filled 2020-03-30: qty 1
  Filled 2020-03-30 (×2): qty 21
  Filled 2020-03-30 (×5): qty 1

## 2020-03-30 NOTE — Progress Notes (Signed)
D. Pt has been calm and cooperative-observed attending group led by RN this am.Per pt's self inventory, pt rated her depression, hopelessness and anxiety all 0's. Pt writes that her goal today is "staying awake for longer periods of time", and writes that she will achieve this by "limiting the amount of time spent in my room".  Pt currently denies SI/HI and AVH   A. Labs and vitals monitored. Pt compliant with medications. Pt supported emotionally and encouraged to express concerns and ask questions.   R. Pt remains safe with 15 minute checks. Will continue POC.

## 2020-03-30 NOTE — Progress Notes (Signed)
Adult Psychoeducational Group Note  Date:  03/30/2020 Time:  10:09 PM  Group Topic/Focus:  Wrap-Up Group:   The focus of this group is to help patients review their daily goal of treatment and discuss progress on daily workbooks.  Participation Level:  Did Not Attend  Participation Quality:  Did Not Attend  Affect:  Did Not Attend  Cognitive:  Did Not Attend  Insight: None  Engagement in Group:  Did Not Attend  Modes of Intervention:  Did Not Attend  Additional Comments:  Pt did not attend evening wrap up group tonight.  Felipa Furnace 03/30/2020, 10:09 PM

## 2020-03-30 NOTE — Progress Notes (Signed)
[facility] MD Progress Note  03/30/2020 10:46 AM Stephanie RIEHLE  MRN:  [id number] Subjective:  Patient is a 44 year old female with a past psychiatric history significant for schizophrenia who presented to the [facility] emergency department on 03/24/2020 after tearing up her apartment, showing evidence of psychosis.  Objective: Patient is seen and examined.  Patient is a 44 year old female with the above-stated past psychiatric history who is seen in follow-up.  She had a good day yesterday, but apparently did not sleep well last night.  Nursing notes show she only slept 4.5 hours last night.  She denied having any other issues with regard to any psychotic symptoms.  She does have some delusional thinking, but no active auditory or visual hallucinations per her report.  She did complain of some right lower quadrant more pelvic upper thigh pain, and believes she has some form of urinary tract infection.  Nursing stated that they felt as though she had been compliant with her medications last night.  Her urinalysis from 5/4 was a contaminated sample.  There were squamous epithelial cells 21-50, few bacteria, and 21-50 white blood cells.  We will repeat that urinalysis today.  Her previous culture showed multiple organisms.  Her vital signs are stable, she is afebrile.  Principal Problem: <principal problem not specified> Diagnosis: Active Problems:   Schizoaffective disorder ([facility])  Total Time spent with patient: 20 minutes  Past Psychiatric History: See admission H&P  Past Medical History:  Past Medical History:  Diagnosis Date  . Schizophrenia [facility])     Past Surgical History:  Procedure Laterality Date  . INDUCED ABORTION     Family History: History reviewed. No pertinent family history. Family Psychiatric  History: See admission H&P Social History:  Social History   Substance and Sexual Activity  Alcohol Use Never     Social History   Substance and Sexual Activity   Drug Use Never    Social History   Socioeconomic History  . Marital status: Unknown    Spouse name: Not on file  . Number of children: Not on file  . Years of education: Not on file  . Highest education level: Not on file  Occupational History  . Not on file  Tobacco Use  . Smoking status: Never Smoker  . Smokeless tobacco: Never Used  Substance and Sexual Activity  . Alcohol use: Never  . Drug use: Never  . Sexual activity: Never  Other Topics Concern  . Not on file  Social History Narrative   ** Merged History Encounter **       Social Determinants of Health   Financial Resource Strain:   . Difficulty of Paying Living Expenses:   Food Insecurity:   . Worried About Programme researcher, broadcasting/film/video in the Last Year:   . Barista in the Last Year:   Transportation Needs:   . Freight forwarder (Medical):   Marland Kitchen Lack of Transportation (Non-Medical):   Physical Activity:   . Days of Exercise per Week:   . Minutes of Exercise per Session:   Stress:   . Feeling of Stress :   Social Connections:   . Frequency of Communication with Friends and Family:   . Frequency of Social Gatherings with Friends and Family:   . Attends Religious Services:   . Active Member of Clubs or Organizations:   . Attends Banker Meetings:   Marland Kitchen Marital Status:    Additional Social History:  Sleep: Fair  Appetite:  Fair  Current Medications: Current Facility-Administered Medications  Medication Dose Route Frequency Provider Last Rate Last Admin  . acetaminophen (TYLENOL) tablet 650 mg  650 mg Oral Q6H PRN Nira Conn A, NP   650 mg at 03/28/20 1406  . alum & mag hydroxide-simeth (MAALOX/MYLANTA) 200-200-20 MG/5ML suspension 30 mL  30 mL Oral Q4H PRN Nira Conn A, NP   30 mL at 03/28/20 1700  . ARIPiprazole (ABILIFY) tablet 15 mg  15 mg Oral Daily Antonieta Pert, MD   15 mg at 03/30/20 0841  . carbamazepine (TEGRETOL) chewable tablet 100 mg   100 mg Oral TID Antonieta Pert, MD      . hydrOXYzine (ATARAX/VISTARIL) tablet 25 mg  25 mg Oral TID PRN Nira Conn A, NP      . ziprasidone (GEODON) injection 10 mg  10 mg Intramuscular Q12H PRN Jackelyn Poling, NP       And  . LORazepam (ATIVAN) tablet 1 mg  1 mg Oral PRN Nira Conn A, NP      . magnesium hydroxide (MILK OF MAGNESIA) suspension 30 mL  30 mL Oral Daily PRN Nira Conn A, NP      . OLANZapine zydis (ZYPREXA) disintegrating tablet 10 mg  10 mg Oral Q8H PRN Antonieta Pert, MD   10 mg at 03/28/20 1406   And  . ziprasidone (GEODON) injection 20 mg  20 mg Intramuscular PRN Antonieta Pert, MD      . risperiDONE (RISPERDAL M-TABS) disintegrating tablet 4 mg  4 mg Oral QHS Antonieta Pert, MD   4 mg at 03/29/20 2122  . SUMAtriptan (IMITREX) tablet 50 mg  50 mg Oral Q2H PRN Antonieta Pert, MD   50 mg at 03/28/20 1406    Lab Results: No results found for this or any previous visit (from the past 48 hour(s)).  Blood Alcohol level:  Lab Results  Component Value Date   ETH <10 03/24/2020   ETH <10 03/20/2020    Metabolic Disorder Labs: Lab Results  Component Value Date   HGBA1C 6.6 (H) 03/26/2020   MPG 142.72 03/26/2020   No results found for: PROLACTIN Lab Results  Component Value Date   CHOL 173 03/26/2020   TRIG 67 03/26/2020   HDL 47 03/26/2020   CHOLHDL 3.7 03/26/2020   VLDL 13 03/26/2020   LDLCALC 113 (H) 03/26/2020    Physical Findings: AIMS: Facial and Oral Movements Muscles of Facial Expression: None, normal Lips and Perioral Area: None, normal Jaw: None, normal Tongue: None, normal,Extremity Movements Upper (arms, wrists, hands, fingers): None, normal Lower (legs, knees, ankles, toes): None, normal, Trunk Movements Neck, shoulders, hips: None, normal, Overall Severity Severity of abnormal movements (highest score from questions above): None, normal Incapacitation due to abnormal movements: None, normal Patient's awareness of  abnormal movements (rate only patient's report): No Awareness, Dental Status Current problems with teeth and/or dentures?: No Does patient usually wear dentures?: No  CIWA:    COWS:     Musculoskeletal: Strength & Muscle Tone: within normal limits Gait & Station: normal Patient leans: N/A  Psychiatric Specialty Exam: Physical Exam  Nursing note and vitals reviewed. Constitutional: She is oriented to person, place, and time. She appears well-developed and well-nourished.  HENT:  Head: Normocephalic and atraumatic.  Respiratory: Effort normal.  Neurological: She is alert and oriented to person, place, and time.    Review of Systems  Blood pressure 138/78, pulse 87, temperature 98.9 F (  37.2 C), temperature source Oral, resp. rate 18, height 5\' 1"  (1.549 m), weight 68.9 kg, SpO2 100 %.Body mass index is 28.72 kg/m.  General Appearance: Casual  Eye Contact:  Fair  Speech:  Normal Rate  Volume:  Normal  Mood:  Euthymic  Affect:  Congruent  Thought Process:  Coherent and Descriptions of Associations: Circumstantial  Orientation:  Full (Time, Place, and Person)  Thought Content:  Delusions  Suicidal Thoughts:  No  Homicidal Thoughts:  No  Memory:  Immediate;   Fair Recent;   Fair Remote;   Fair  Judgement:  Intact  Insight:  Fair  Psychomotor Activity:  Normal  Concentration:  Concentration: Fair and Attention Span: Fair  Recall:  [employer] of Knowledge:  Fair  Language:  Good  Akathisia:  Negative  Handed:  Right  AIMS (if indicated):     Assets:  Desire for Improvement Resilience  ADL's:  Intact  Cognition:  WNL  Sleep:  Number of Hours: 4.5     Treatment Plan Summary: Daily contact with patient to assess and evaluate symptoms and progress in treatment, Medication management and Plan : Patient is seen and examined.  Patient is a 44 year old female with the above-stated past psychiatric history who is seen in follow-up.   Diagnosis #1 schizoaffective disorder;  bipolar type, #2 migraine headaches, #3 right lower quadrant pain  Patient is seen in follow-up.  She had a good day yesterday, but did not sleep as well last night.  No change in her antipsychotic medication.  I will order trazodone 50 mg p.o. nightly as needed for insomnia.  I will also add Tegretol 100 mg p.o. 3 times daily for some mood stability to help with that.  She is basically ready for discharge, but she is working with an [employer] training and education officer for housing.  Discharge will most likely be tomorrow or the day after.  We will order urinalysis today, and try and get a noncontaminated sample so if she does have infection we can treated.  She is not febrile at least at this point.  1.  Continue Abilify 15 mg p.o. daily for psychosis. 2.  Continue hydroxyzine 25 mg p.o. 3 times daily as needed anxiety. 3.  Continue Zyprexa/lorazepam/Geodon agitation protocol as needed. 4.  Continue Risperdal 4 mg p.o. nightly for psychosis. 5.  Continue sumatriptan 50 mg p.o. every 2 hours as needed migraine headaches. 6.  Continue Geodon 20 mg IM as needed agitation. 7.  Add Tegretol 100 mg p.o. 3 times daily for mood stability. 8.  Obtain new urinalysis for possibly urinary tract infection. 9.  Disposition planning-in progress.  Sharma Covert, MD 03/30/2020, 10:46 AM

## 2020-03-31 LAB — CBC WITH DIFFERENTIAL/PLATELET
Abs Immature Granulocytes: 0.02 10*3/uL (ref 0.00–0.07)
Basophils Absolute: 0 10*3/uL (ref 0.0–0.1)
Basophils Relative: 0 %
Eosinophils Absolute: 0.1 10*3/uL (ref 0.0–0.5)
Eosinophils Relative: 2 %
HCT: 33.6 % — ABNORMAL LOW (ref 36.0–46.0)
Hemoglobin: 10.3 g/dL — ABNORMAL LOW (ref 12.0–15.0)
Immature Granulocytes: 0 %
Lymphocytes Relative: 38 %
Lymphs Abs: 1.8 10*3/uL (ref 0.7–4.0)
MCH: 25.7 pg — ABNORMAL LOW (ref 26.0–34.0)
MCHC: 30.7 g/dL (ref 30.0–36.0)
MCV: 83.8 fL (ref 80.0–100.0)
Monocytes Absolute: 0.4 10*3/uL (ref 0.1–1.0)
Monocytes Relative: 8 %
Neutro Abs: 2.4 10*3/uL (ref 1.7–7.7)
Neutrophils Relative %: 52 %
Platelets: 347 10*3/uL (ref 150–400)
RBC: 4.01 MIL/uL (ref 3.87–5.11)
RDW: 17 % — ABNORMAL HIGH (ref 11.5–15.5)
WBC: 4.7 10*3/uL (ref 4.0–10.5)
nRBC: 0 % (ref 0.0–0.2)

## 2020-03-31 LAB — HEPATIC FUNCTION PANEL
ALT: 33 U/L (ref 0–44)
AST: 28 U/L (ref 15–41)
Albumin: 3.6 g/dL (ref 3.5–5.0)
Alkaline Phosphatase: 56 U/L (ref 38–126)
Bilirubin, Direct: 0.1 mg/dL (ref 0.0–0.2)
Indirect Bilirubin: 0.5 mg/dL (ref 0.3–0.9)
Total Bilirubin: 0.6 mg/dL (ref 0.3–1.2)
Total Protein: 7.2 g/dL (ref 6.5–8.1)

## 2020-03-31 LAB — URINALYSIS, COMPLETE (UACMP) WITH MICROSCOPIC
Bacteria, UA: NONE SEEN
Bilirubin Urine: NEGATIVE
Glucose, UA: NEGATIVE mg/dL
Hgb urine dipstick: NEGATIVE
Ketones, ur: NEGATIVE mg/dL
Nitrite: NEGATIVE
Protein, ur: NEGATIVE mg/dL
Specific Gravity, Urine: 1.012 (ref 1.005–1.030)
pH: 6 (ref 5.0–8.0)

## 2020-03-31 LAB — CARBAMAZEPINE LEVEL, TOTAL: Carbamazepine Lvl: 2.8 ug/mL — ABNORMAL LOW (ref 4.0–12.0)

## 2020-03-31 MED ORDER — HYDROXYZINE HCL 25 MG PO TABS: 25.0000 mg | ORAL_TABLET | Freq: Three times a day (TID) | ORAL | 0 refills | Status: DC | PRN

## 2020-03-31 MED ORDER — RISPERIDONE 4 MG PO TBDP: 4.0000 mg | ORAL_TABLET | Freq: Every day | ORAL | 0 refills | Status: DC

## 2020-03-31 MED ORDER — CARBAMAZEPINE 100 MG PO CHEW: 100.0000 mg | CHEWABLE_TABLET | Freq: Three times a day (TID) | ORAL | 0 refills | Status: DC

## 2020-03-31 MED ORDER — ARIPIPRAZOLE 15 MG PO TABS: 15.0000 mg | ORAL_TABLET | Freq: Every day | ORAL | 0 refills | Status: DC

## 2020-03-31 NOTE — Tx Team (Signed)
Interdisciplinary Treatment and Diagnostic Plan Update  03/31/2020 Time of Session: 9:00am Stephanie [name] MRN: [id number]  Principal Diagnosis: <principal problem not specified>  Secondary Diagnoses: Active Problems:   Schizoaffective disorder (HCC)   Current Medications:  Current Facility-Administered Medications  Medication Dose Route Frequency Provider Last Rate Last Admin  . acetaminophen (TYLENOL) tablet 650 mg  650 mg Oral Q6H PRN Nira Conn A, NP   650 mg at 03/30/20 1636  . alum & mag hydroxide-simeth (MAALOX/MYLANTA) 200-200-20 MG/5ML suspension 30 mL  30 mL Oral Q4H PRN Nira Conn A, NP   30 mL at 03/28/20 1700  . ARIPiprazole (ABILIFY) tablet 15 mg  15 mg Oral Daily Antonieta Pert, MD   15 mg at 03/31/20 0806  . carbamazepine (TEGRETOL) chewable tablet 100 mg  100 mg Oral TID Antonieta Pert, MD   100 mg at 03/31/20 1740  . hydrOXYzine (ATARAX/VISTARIL) tablet 25 mg  25 mg Oral TID PRN Nira Conn A, NP      . ziprasidone (GEODON) injection 10 mg  10 mg Intramuscular Q12H PRN Jackelyn Poling, NP       And  . LORazepam (ATIVAN) tablet 1 mg  1 mg Oral PRN Nira Conn A, NP      . magnesium hydroxide (MILK OF MAGNESIA) suspension 30 mL  30 mL Oral Daily PRN Nira Conn A, NP      . OLANZapine zydis (ZYPREXA) disintegrating tablet 10 mg  10 mg Oral Q8H PRN Antonieta Pert, MD   10 mg at 03/28/20 1406   And  . ziprasidone (GEODON) injection 20 mg  20 mg Intramuscular PRN Antonieta Pert, MD      . risperiDONE (RISPERDAL M-TABS) disintegrating tablet 4 mg  4 mg Oral QHS Antonieta Pert, MD   4 mg at 03/30/20 2116  . SUMAtriptan (IMITREX) tablet 50 mg  50 mg Oral Q2H PRN Antonieta Pert, MD   50 mg at 03/28/20 1406   PTA Medications: Medications Prior to Admission  Medication Sig Dispense Refill Last Dose  . ARIPiprazole ER (ABILIFY MAINTENA) 400 MG SRER injection Inject 2 mLs (400 mg total) into the muscle every 28 (twenty-eight) days. Due approx 6/13  (Patient not taking: Reported on 03/14/2020) 1 each 11   . benztropine (COGENTIN) 1 MG tablet Take 1 tablet (1 mg total) by mouth 2 (two) times daily. (Patient not taking: Reported on 03/14/2020) 60 tablet 2   . ondansetron (ZOFRAN) 4 MG tablet Take 1 tablet (4 mg total) by mouth every 8 (eight) hours as needed for nausea or vomiting. 12 tablet 0   . risperiDONE (RISPERDAL) 4 MG tablet Take 1 tablet (4 mg total) by mouth at bedtime. (Patient not taking: Reported on 03/14/2020) 30 tablet 1     Patient Stressors:    Patient Strengths:    Treatment Modalities: Medication Management, Group therapy, Case management,  1 to 1 session with clinician, Psychoeducation, Recreational therapy.   Physician Treatment Plan for Primary Diagnosis: <principal problem not specified> Long Term Goal(s): Improvement in symptoms so as ready for discharge Improvement in symptoms so as ready for discharge   Short Term Goals: Ability to identify changes in lifestyle to reduce recurrence of condition will improve Ability to verbalize feelings will improve Ability to disclose and discuss suicidal ideas Ability to demonstrate self-control will improve Ability to identify and develop effective coping behaviors will improve Ability to maintain clinical measurements within normal limits will improve Compliance with prescribed medications will  improve Ability to identify changes in lifestyle to reduce recurrence of condition will improve Ability to verbalize feelings will improve Ability to disclose and discuss suicidal ideas Ability to demonstrate self-control will improve Ability to identify and develop effective coping behaviors will improve Ability to maintain clinical measurements within normal limits will improve Compliance with prescribed medications will improve  Medication Management: Evaluate patient's response, side effects, and tolerance of medication regimen.  Therapeutic Interventions: 1 to 1 sessions,  Unit Group sessions and Medication administration.  Evaluation of Outcomes: Adequate for Discharge  Physician Treatment Plan for Secondary Diagnosis: Active Problems:   Schizoaffective disorder (HCC)  Long Term Goal(s): Improvement in symptoms so as ready for discharge Improvement in symptoms so as ready for discharge   Short Term Goals: Ability to identify changes in lifestyle to reduce recurrence of condition will improve Ability to verbalize feelings will improve Ability to disclose and discuss suicidal ideas Ability to demonstrate self-control will improve Ability to identify and develop effective coping behaviors will improve Ability to maintain clinical measurements within normal limits will improve Compliance with prescribed medications will improve Ability to identify changes in lifestyle to reduce recurrence of condition will improve Ability to verbalize feelings will improve Ability to disclose and discuss suicidal ideas Ability to demonstrate self-control will improve Ability to identify and develop effective coping behaviors will improve Ability to maintain clinical measurements within normal limits will improve Compliance with prescribed medications will improve     Medication Management: Evaluate patient's response, side effects, and tolerance of medication regimen.  Therapeutic Interventions: 1 to 1 sessions, Unit Group sessions and Medication administration.  Evaluation of Outcomes: Adequate for Discharge   RN Treatment Plan for Primary Diagnosis: <principal problem not specified> Long Term Goal(s): Knowledge of disease and therapeutic regimen to maintain health will improve  Short Term Goals: Ability to verbalize feelings will improve, Ability to identify and develop effective coping behaviors will improve and Compliance with prescribed medications will improve  Medication Management: RN will administer medications as ordered by provider, will assess and evaluate  patient's response and provide education to patient for prescribed medication. RN will report any adverse and/or side effects to prescribing provider.  Therapeutic Interventions: 1 on 1 counseling sessions, Psychoeducation, Medication administration, Evaluate responses to treatment, Monitor vital signs and CBGs as ordered, Perform/monitor CIWA, COWS, AIMS and Fall Risk screenings as ordered, Perform wound care treatments as ordered.  Evaluation of Outcomes: Adequate for Discharge   LCSW Treatment Plan for Primary Diagnosis: <principal problem not specified> Long Term Goal(s): Safe transition to appropriate next level of care at discharge, Engage patient in therapeutic group addressing interpersonal concerns.  Short Term Goals: Engage patient in aftercare planning with referrals and resources, Increase social support, Facilitate acceptance of mental health diagnosis and concerns, Identify triggers associated with mental health/substance abuse issues and Increase skills for wellness and recovery  Therapeutic Interventions: Assess for all discharge needs, 1 to 1 time with Social worker, Explore available resources and support systems, Assess for adequacy in community support network, Educate family and significant other(s) on suicide prevention, Complete Psychosocial Assessment, Interpersonal group therapy.  Evaluation of Outcomes: Adequate for Discharge  Progress in Treatment: Attending groups: No.  Participating in groups: No. Taking medication as prescribed: Declining medications selectively. Toleration medication: Yes. Family/Significant other contact made: No, will contact:  supports if consents are granted. Patient understands diagnosis: No. Discussing patient identified problems/goals with staff: No. Medical problems stabilized or resolved: Yes. Denies suicidal/homicidal ideation: Yes. Issues/concerns per patient self-inventory:  Yes.  New problem(s) identified: Yes, Describe:   unstable housing.  New Short Term/Long Term Goal(s): medication management for mood stabilization; elimination of SI thoughts; development of comprehensive mental wellness/sobriety plan.  Patient Goals:  "Work on anger management."  Discharge Plan or Barriers:  Declines all follow up. CSW listed walk in information for [facility] the [place]  Reason for Continuation of Hospitalization: Anxiety  Estimated Length of Stay: discharging today  Attendees: Patient: Stephanie [name] 03/31/2020 9:37 AM  Physician: Queen Blossom 03/31/2020 9:37 AM  Nursing:  03/31/2020 9:37 AM  RN Care Manager: 03/31/2020 9:37 AM  Social Worker: Stephanie Acre, Latanya Presser 03/31/2020 9:37 AM  Recreational Therapist:  03/31/2020 9:37 AM  Other:  03/31/2020 9:37 AM  Other:  03/31/2020 9:37 AM  Other: 03/31/2020 9:37 AM    Scribe for Treatment Team: Joellen Jersey, [place] 03/31/2020 9:37 AM

## 2020-03-31 NOTE — BHH Counselor (Signed)
Patient verbalizes readiness for discharge, she reports that she can return to her apartment and she would like to walk home at discharge. Patient continues to decline referrals for outpatient follow up and collateral consents.  Enid Cutter, MSW, LCSW-A Clinical Social Worker [facility]

## 2020-03-31 NOTE — Progress Notes (Signed)
Recreation Therapy Notes  INPATIENT RECREATION TR PLAN  Patient Details Name: Stephanie [name] MRN: [id number] DOB: Jun 07, 1976 Today's Date: 03/31/2020  Rec Therapy Plan Is patient appropriate for Therapeutic Recreation?: Yes Treatment times per week: about 3 days Estimated Length of Stay: 5-7 days TR Treatment/Interventions: Group participation ([name])  Discharge Criteria Pt will be discharged from therapy if:: Discharged Treatment plan/goals/alternatives discussed and agreed upon by:: Patient/family  Discharge Summary Short term goals set: See patient care plan Short term goals met: Complete Progress toward goals comments: Groups attended Which groups?: Self-esteem, Communication, Other ([name])(Triggers, Team building) Reason goals not met: None Therapeutic equipment acquired: N/A Reason patient discharged from therapy: Discharge from hospital Pt/family agrees with progress & goals achieved: Yes Date patient discharged from therapy: 03/31/20    Stephanie [name], LRT/CTRS  Stephanie [name], Stephanie [name] 03/31/2020, 11:34 AM

## 2020-03-31 NOTE — Progress Notes (Signed)
  [facility] Adult Case Management Discharge Plan :  Will you be returning to the same living situation after discharge:  Yes,  home. At discharge, do you have transportation home?: Yes,  plans to walk home. Declines transportation assistance. Bus pass placed on chart. Do you have the ability to pay for your medications: No. Provided samples, declines follow up.  Release of information consent forms completed and in the chart;  Patient's signature needed at discharge.  Patient to Follow up at: Follow-up Information    Patient declined Follow up.   Contact information: Declines all follow up       [facility] [employer]. Go to.   Specialty: Professional Counselor Why: Please follow up with clinic for outpatient services during walk-in hours; Monday-Friday 8:30a.-12:00p and 1:00p-2:30p. Be sure to bring the following; photo ID, insurance card, SSN, current medications and discharge paperwork from this hospitalization.  Contact information: [facility] [place] [address] [place] [address] [phone]           Next level of care provider has access to [facility] Link:no  Safety Planning and Suicide Prevention discussed: Yes,  with patient. Patient declines consents.  Have you used any form of tobacco in the last 30 days? (Cigarettes, Smokeless Tobacco, Cigars, and/or Pipes): No  Has patient been referred to the Quitline?: N/A patient is not a smoker  Patient has been referred for addiction treatment: Pt. refused referral  Darreld Mclean, LCSWA 03/31/2020, 9:32 AM

## 2020-03-31 NOTE — Plan of Care (Signed)
Pt was able to identify triggers at completion of recreation therapy group sessions.   Liller Yohn, LRT/CTRS 

## 2020-03-31 NOTE — Progress Notes (Signed)
Pt discharged to lobby. Pt was stable and appreciative at that time. All papers, samples and prescriptions were given and valuables returned. Verbal understanding expressed. Denies SI/HI and A/VH. Pt given opportunity to express concerns and ask questions.  

## 2020-03-31 NOTE — Progress Notes (Signed)
Recreation Therapy Notes  Date: 5.10.21 Time: 1000 Location: 500 Hall Dayroom  Group Topic: Triggers  Goal Area(s) Addresses:  Patient will identify triggers. Patient will identify coping skills to deal with triggers.  Behavioral Response: Engaged  Intervention: Worksheet, pencils  Activity: Triggers.  Patients were to identify their three biggest triggers, how they avoid their triggers and how they deal with triggers head on when they can't be avoided.  Education: Communication, Discharge Planning  Education Outcome: Acknowledges understanding/In group clarification offered/Needs additional education.   Clinical Observations/Feedback: Pt identified biggest triggers as racially charged conversations, people who cannot hold their alcohol and people who talk too much.  Pt avoids these triggers by staying away from them (people, places), change the subject and walk away/stop talking to certain people.  Pt deals with triggers head on by changing the subject, listen until she finds an opportunity to leave and offer to drive them home or have a non-alcoholic drink with them.     Caroll Rancher, LRT/CTRS     Lillia Abed, Shyan Scalisi A 03/31/2020 11:09 AM

## 2020-03-31 NOTE — Progress Notes (Signed)
D: Pt continues to be very flat and depressed on the unit today. Pt also continues to be very isolative. Pt reported good day although not interacting with peers or staff.  Pt reported being negative SI/HI, no AH/VH noted. A: 15 min checks continued for patient safety. R: Pts safety maintained.
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
## 2020-03-31 NOTE — [facility] Suicide Risk Assessment (Signed)
[facility] Discharge Suicide Risk Assessment   Principal Problem:  Schizoaffective Disorder  Discharge Diagnoses: Active Problems:   Schizoaffective disorder (HCC)   Total Time spent with patient: 30 minutes  Musculoskeletal: Strength & Muscle Tone: within normal limits Gait & Station: normal Patient leans: N/A  Psychiatric Specialty Exam: Review of Systems no headache, no chest pain, no shortness of breath, no vomiting . No rash  Blood pressure 133/83, pulse 96, temperature 99.3 F (37.4 C), temperature source Oral, resp. rate 18, height 5\' 1"  (1.549 m), weight 68.9 kg, SpO2 100 %.Body mass index is 28.72 kg/m.  General Appearance: Casual  Eye Contact::  Fair  Speech:  Normal Rate409  Volume:  Normal  Mood:  reports " my mood is good today", denies feeling depressed   Affect:  vaguely blunted, does smile briefly at times during session  Thought Process:  Linear  Orientation:  Other:  fully alert and attentive, oriented x 3   Thought Content:  denies hallucinations,no delusions are expressed, does not present internally preoccupied   Suicidal Thoughts:  No denies suicidal or self injurious ideations  Homicidal Thoughts:  No denies homicidal or violent ideations  Memory:  recent and remote grossly intact   Judgement:  Fair/ improving   Insight:  fair/ improving   Psychomotor Activity:  Normal- no psychomotor agitation or restlessness at this time  Concentration:  Good  Recall:  Good  Fund of Knowledge:Good  Language: Good  Akathisia:  Negative  Handed:  Right  AIMS (if indicated):     Assets:  Desire for Improvement Resilience  Sleep:  Number of Hours: 6.25  Cognition: WNL  ADL's:  Intact   Mental Status Per Nursing Assessment::   On Admission:  NA  Demographic Factors:  Single, lives alone  Loss Factors: Medication non compliance , history of chronic mental illness   Historical Factors: Prior psychiatric admissions , has been diagnosed with Schizophrenia and with  Schizoaffective Disorder in the past.    Risk Reduction Factors:   Positive coping skills or problem solving skills  Continued Clinical Symptoms:  At this time patient is alert, attentive, calm, polite on approach, reports she is feeling better than on admission. Mood is " better" and describes as 9/10 with 10 being best. Affect is vaguely restricted but does smile briefly at times, no thought disorder is noted at this time, denies suicidal or self injurious ideations, denies homicidal or violent ideations, no hallucinations , does not appear internally preoccupied, no delusions are currently endorsed and presents oriented x 3. Denies medication side effects- we reviewed medication side effect profile to include potential risk of metabolic/movement disorders/TD/ NMS on Risperdal/ Abilify and potential for severe rash, CBC abnormalities and severe rash on Tegretol. She is also aware that Tegretol has teratogenic risk.  Behavior on unit in good control / limited interaction with peers/ polite on approach. I have reviewed chart notes- Dr. [phone] note relating to discharge planning for today   Cognitive Features That Contribute To Risk:  No gross cognitive deficits noted upon discharge. Is alert , attentive, and oriented x 3   Suicide Risk:  Mild:  Suicidal ideation of limited frequency, intensity, duration, and specificity.  There are no identifiable plans, no associated intent, mild dysphoria and related symptoms, good self-control (both objective and subjective assessment), few other risk factors, and identifiable protective factors, including available and accessible social support.  Follow-up Information    Patient declined Follow up.   Contact information: Declines all follow up  [facility] to.   Specialty: Professional Counselor Why: Please follow up with clinic for outpatient services during walk-in hours; Monday-Friday 8:30a.-12:00p and 1:00p-2:30p.  Be sure to bring the following; photo ID, insurance card, SSN, current medications and discharge paperwork from this hospitalization.  Contact information: [facility] [place] [address] [phone]           Plan Of Care/Follow-up recommendations:  Activity:  as tolerated Diet:  heart healthy Tests:  NA Other:  See below  Patient is expressing improvement and readiness for discharge- there are no grounds for involuntary commitment at this time, leaving unit in good spirits/  plans to return home. Plans to follow up as above. We reviewed importance of following with PCP for ongoing monitoring and to follow up HgbA1C, which was mildly elevated at 6.6  Will refer to [facility] for medical follow up as needed .    Jenne Campus, MD 03/31/2020, 10:39 AM

## 2020-04-14 ENCOUNTER — Emergency Department (HOSPITAL_COMMUNITY)
Admission: EM | Admit: 2020-04-14 | Discharge: 2020-04-14 | Disposition: A | Discharge status: Home / Self Care | Payer: Self-pay | Attending: Emergency Medicine | Admitting: Emergency Medicine

## 2020-04-14 ENCOUNTER — Other Ambulatory Visit: Payer: Self-pay

## 2020-04-14 ENCOUNTER — Encounter (HOSPITAL_COMMUNITY): Payer: Self-pay

## 2020-04-14 DIAGNOSIS — R519 Headache, unspecified: Secondary | ICD-10-CM | POA: Insufficient documentation

## 2020-04-14 DIAGNOSIS — R55 Syncope and collapse: Secondary | ICD-10-CM | POA: Insufficient documentation

## 2020-04-14 DIAGNOSIS — Z79899 Other long term (current) drug therapy: Secondary | ICD-10-CM | POA: Insufficient documentation

## 2020-04-14 DIAGNOSIS — E860 Dehydration: Secondary | ICD-10-CM | POA: Insufficient documentation

## 2020-04-14 DIAGNOSIS — R42 Dizziness and giddiness: Secondary | ICD-10-CM | POA: Insufficient documentation

## 2020-04-14 LAB — BASIC METABOLIC PANEL
Anion gap: 13 (ref 5–15)
BUN: 15 mg/dL (ref 6–20)
CO2: 21 mmol/L — ABNORMAL LOW (ref 22–32)
Calcium: 9.7 mg/dL (ref 8.9–10.3)
Chloride: 107 mmol/L (ref 98–111)
Creatinine, Ser: 0.97 mg/dL (ref 0.44–1.00)
GFR calc Af Amer: >60 mL/min (ref >60)
GFR calc non Af Amer: >60 mL/min (ref >60)
Glucose, Bld: 67 mg/dL — ABNORMAL LOW (ref 70–99)
Potassium: 3.8 mmol/L (ref 3.5–5.1)
Sodium: 141 mmol/L (ref 135–145)

## 2020-04-14 LAB — CBG MONITORING, ED
Glucose-Capillary: 40 mg/dL — CL (ref 70–99)
Glucose-Capillary: 93 mg/dL (ref 70–99)

## 2020-04-14 LAB — CBC
HCT: 34.1 % — ABNORMAL LOW (ref 36.0–46.0)
Hemoglobin: 10.3 g/dL — ABNORMAL LOW (ref 12.0–15.0)
MCH: 25.8 pg — ABNORMAL LOW (ref 26.0–34.0)
MCHC: 30.2 g/dL (ref 30.0–36.0)
MCV: 85.5 fL (ref 80.0–100.0)
Platelets: 365 10*3/uL (ref 150–400)
RBC: 3.99 MIL/uL (ref 3.87–5.11)
RDW: 17.7 % — ABNORMAL HIGH (ref 11.5–15.5)
WBC: 6.4 10*3/uL (ref 4.0–10.5)
nRBC: 0 % (ref 0.0–0.2)

## 2020-04-14 LAB — URINALYSIS, ROUTINE W REFLEX MICROSCOPIC
Bilirubin Urine: NEGATIVE
Glucose, UA: NEGATIVE mg/dL
Hgb urine dipstick: NEGATIVE
Ketones, ur: 20 mg/dL — AB
Nitrite: NEGATIVE
Protein, ur: 100 mg/dL — AB
Specific Gravity, Urine: 1.025 (ref 1.005–1.030)
WBC, UA: >50 WBC/hpf — ABNORMAL HIGH (ref 0–5)
pH: 5 (ref 5.0–8.0)

## 2020-04-14 LAB — I-STAT BETA HCG BLOOD, ED (MC, WL, AP ONLY): I-stat hCG, quantitative: 7.6 m[IU]/mL — ABNORMAL HIGH (ref <5)

## 2020-04-14 MED ORDER — SODIUM CHLORIDE 0.9% FLUSH: 3.0000 mL | Freq: Once | INTRAVENOUS | Status: DC

## 2020-04-14 MED ORDER — SODIUM CHLORIDE 0.9 % IV BOLUS
1000.0000 mL | Freq: Once | INTRAVENOUS | Status: AC
  Administered 2020-04-14: 1000 mL via INTRAVENOUS

## 2020-04-14 NOTE — ED Notes (Signed)
Pt given 2 cups of orange juice to drink, will reassess blood sugar.

## 2020-04-14 NOTE — ED Triage Notes (Signed)
Patient states she almost passed out today while walking in the heat.

## 2020-04-14 NOTE — ED Provider Notes (Signed)
[facility] DEPT Provider Note   CSN: [id number] Arrival date & time: 04/14/20  1515     History Chief Complaint  Patient presents with  . Near Syncope    Stephanie [name] is a 44 y.o. female.  HPI     Reports being in the heat today, feeling the electric shock feeling in her head and lightheadedness. Developed lightheadedness after walking in the heat. No chest pain, no dyspnea, no vomiting or diarrhea. Denies black or bloody stools.  Has had sensation of "electric shock" to head, started yesterday left sided headache, then today noted right sided headache, then  Reports has been going on for months to a year, constant when it happens, not associated with anything, feels like being electrocuted, has had it before and been evaluated for it. Feels similar to what she has had before.  Nothing seems to make it better or worse.  Stops on its own.   Reports they have said she has schizophrenia but that she doesn't believe this is true and doesn't need to be on medications.  She becomes agitated with my history   Past Medical History:  Diagnosis Date  . Schizophrenia [facility])     Patient Active Problem List   Diagnosis Date Noted  . Schizoaffective disorder ([place]) 03/31/2019    Past Surgical History:  Procedure Laterality Date  . INDUCED ABORTION       OB History   No obstetric history on file.     Family History  Problem Relation Age of Onset  . Cancer Father     Social History   Tobacco Use  . Smoking status: Never Smoker  . Smokeless tobacco: Never Used  Substance Use Topics  . Alcohol use: Never  . Drug use: Never    Home Medications Prior to Admission medications   Medication Sig Start Date End Date Taking? Authorizing Provider  ARIPiprazole (ABILIFY) 15 MG tablet Take 1 tablet (15 mg total) by mouth daily. 04/01/20  Yes Starkes-Perry, Gayland Curry, FNP  ARIPiprazole ER (ABILIFY MAINTENA) 400 MG SRER injection Inject 2 mLs (400 mg  total) into the muscle every 28 (twenty-eight) days. Due approx 6/13 04/04/19  Yes Johnn Hai, MD  carbamazepine (TEGRETOL) 100 MG chewable tablet Chew 1 tablet (100 mg total) by mouth 3 (three) times daily. 03/31/20  Yes Starkes-Perry, Gayland Curry, FNP  risperiDONE (RISPERDAL M-TABS) 4 MG disintegrating tablet Take 1 tablet (4 mg total) by mouth at bedtime. 03/31/20  Yes Starkes-Perry, Gayland Curry, FNP  hydrOXYzine (ATARAX/VISTARIL) 25 MG tablet Take 1 tablet (25 mg total) by mouth 3 (three) times daily as needed for anxiety. Patient not taking: Reported on 04/14/2020 03/31/20   Suella Broad, FNP    Allergies    Haldol [haloperidol lactate], Penicillins, and Penicillins  Review of Systems   Review of Systems  Constitutional: Negative for fever.  HENT: Negative for sore throat.   Eyes: Negative for visual disturbance.  Respiratory: Negative for cough and shortness of breath.   Cardiovascular: Negative for chest pain.  Gastrointestinal: Negative for abdominal pain, nausea and vomiting.  Genitourinary: Negative for difficulty urinating.  Musculoskeletal: Negative for back pain and neck pain.  Skin: Negative for rash.  Neurological: Positive for light-headedness and headaches ("electric shock" similar to prior). Negative for syncope.    Physical Exam Updated Vital Signs BP 137/74   Pulse 70   Temp 98.5 F (36.9 C) (Oral)   Resp 18   Ht 5\' 1"  (1.549 m)  Wt 64.4 kg   LMP 03/15/2020 (Approximate)   SpO2 100%   BMI 26.83 kg/m   Physical Exam Vitals and nursing note reviewed.  Constitutional:      General: She is not in acute distress.    Appearance: She is well-developed. She is not diaphoretic.  HENT:     Head: Normocephalic and atraumatic.  Eyes:     Conjunctiva/sclera: Conjunctivae normal.  Cardiovascular:     Rate and Rhythm: Normal rate and regular rhythm.     Heart sounds: Normal heart sounds. No murmur. No friction rub. No gallop.   Pulmonary:     Effort: Pulmonary  effort is normal. No respiratory distress.     Breath sounds: Normal breath sounds. No wheezing or rales.  Abdominal:     General: There is no distension.     Palpations: Abdomen is soft.     Tenderness: There is no abdominal tenderness. There is no guarding.  Musculoskeletal:        General: No tenderness.     Cervical back: Normal range of motion.  Skin:    General: Skin is warm and dry.     Findings: No erythema or rash.  Neurological:     Mental Status: She is alert and oriented to person, place, and time.  Psychiatric:        Mood and Affect: Affect is labile.        Thought Content: Thought content does not include homicidal or suicidal ideation. Thought content does not include homicidal or suicidal plan.     ED Results / Procedures / Treatments   Labs (all labs ordered are listed, but only abnormal results are displayed) Labs Reviewed  BASIC METABOLIC PANEL - Abnormal; Notable for the following components:      Result Value   CO2 21 (*)    Glucose, Bld 67 (*)    All other components within normal limits  CBC - Abnormal; Notable for the following components:   Hemoglobin 10.3 (*)    HCT 34.1 (*)    MCH 25.8 (*)    RDW 17.7 (*)    All other components within normal limits  URINALYSIS, ROUTINE W REFLEX MICROSCOPIC - Abnormal; Notable for the following components:   APPearance CLOUDY (*)    Ketones, ur 20 (*)    Protein, ur 100 (*)    Leukocytes,Ua LARGE (*)    WBC, UA >50 (*)    Bacteria, UA RARE (*)    All other components within normal limits  CBG MONITORING, ED - Abnormal; Notable for the following components:   Glucose-Capillary 40 (*)    All other components within normal limits  I-STAT BETA HCG BLOOD, ED ([facility], [facility], [facility] ONLY) - Abnormal; Notable for the following components:   I-stat hCG, quantitative 7.6 (*)    All other components within normal limits  CBG MONITORING, ED    EKG EKG Interpretation  Date/Time:  Monday Apr 14 2020 15:28:52 EDT Ventricular  Rate:  83 PR Interval:    QRS Duration: 83 QT Interval:  356 QTC Calculation: 419 R Axis:   86 Text Interpretation: Sinus rhythm Biatrial enlargement Probable left ventricular hypertrophy 12 Lead; Mason-Likar New ST depressions inferiolateral leads Confirmed by Alvira Monday ([id number]) on 04/14/2020 3:52:02 PM   Radiology No results found.  Procedures Procedures (including critical care time)  Medications Ordered in ED Medications  sodium chloride 0.9 % bolus 1,000 mL (0 mLs Intravenous Stopped 04/14/20 1939)    ED Course  I  have reviewed the triage vital signs and the nursing notes.  Pertinent labs & imaging results that were available during my care of the patient were reviewed by me and considered in my medical decision making (see chart for details).    MDM Rules/Calculators/A&P                      44yo female with history of schizophrenia presents with concern for lightheadedness after walking in the heat.  Becomes agitated during my history and refusing to answer more questions, but denies SI/HI. Suspect mild psychosis in setting of schizophrenia but do not see signs of danger to herself or others.  She is not telling me quality of headache other than "electrick shock" but does state it has been present for a long time and in this setting I doubt this represents SAH.    Regarding lightheadedness, no sign of significant electrolyte abnormality, pregnancy test similar to prior with hcg 7.6.  UA appears contaminated. Mild anemia not significantly changed. Glucose initially 40, improved with eating.  No CP or dyspnea.  Suspect dehydration in setting of walking in the heat leading to lightheadedness. Patient discharged in stable condition with understanding of reasons to return.    Final Clinical Impression(s) / ED Diagnoses Final diagnoses:  Near syncope  Dehydration    Rx / DC Orders ED Discharge Orders    None       Alvira Monday, MD 04/15/20 [phone]

## 2020-04-17 ENCOUNTER — Encounter (HOSPITAL_COMMUNITY): Payer: Self-pay | Admitting: Emergency Medicine

## 2020-04-17 ENCOUNTER — Emergency Department (HOSPITAL_COMMUNITY)
Admission: EM | Admit: 2020-04-17 | Discharge: 2020-04-18 | Disposition: A | Discharge status: Home / Self Care | Payer: Self-pay | Attending: Emergency Medicine | Admitting: Emergency Medicine

## 2020-04-17 DIAGNOSIS — Z79899 Other long term (current) drug therapy: Secondary | ICD-10-CM | POA: Insufficient documentation

## 2020-04-17 DIAGNOSIS — F209 Schizophrenia, unspecified: Secondary | ICD-10-CM | POA: Insufficient documentation

## 2020-04-17 DIAGNOSIS — F250 Schizoaffective disorder, bipolar type: Secondary | ICD-10-CM

## 2020-04-17 DIAGNOSIS — R45851 Suicidal ideations: Secondary | ICD-10-CM | POA: Insufficient documentation

## 2020-04-17 DIAGNOSIS — R440 Auditory hallucinations: Secondary | ICD-10-CM

## 2020-04-17 DIAGNOSIS — Z20822 Contact with and (suspected) exposure to covid-19: Secondary | ICD-10-CM | POA: Insufficient documentation

## 2020-04-17 DIAGNOSIS — F320 Major depressive disorder, single episode, mild: Secondary | ICD-10-CM | POA: Insufficient documentation

## 2020-04-17 LAB — CBC WITH DIFFERENTIAL/PLATELET
Abs Immature Granulocytes: 0.03 10*3/uL (ref 0.00–0.07)
Basophils Absolute: 0 10*3/uL (ref 0.0–0.1)
Basophils Relative: 0 %
Eosinophils Absolute: 0.1 10*3/uL (ref 0.0–0.5)
Eosinophils Relative: 1 %
HCT: 36.1 % (ref 36.0–46.0)
Hemoglobin: 11.2 g/dL — ABNORMAL LOW (ref 12.0–15.0)
Immature Granulocytes: 0 %
Lymphocytes Relative: 31 %
Lymphs Abs: 2.4 10*3/uL (ref 0.7–4.0)
MCH: 26.3 pg (ref 26.0–34.0)
MCHC: 31 g/dL (ref 30.0–36.0)
MCV: 84.7 fL (ref 80.0–100.0)
Monocytes Absolute: 0.6 10*3/uL (ref 0.1–1.0)
Monocytes Relative: 8 %
Neutro Abs: 4.5 10*3/uL (ref 1.7–7.7)
Neutrophils Relative %: 60 %
Platelets: 388 10*3/uL (ref 150–400)
RBC: 4.26 MIL/uL (ref 3.87–5.11)
RDW: 17.6 % — ABNORMAL HIGH (ref 11.5–15.5)
WBC: 7.7 10*3/uL (ref 4.0–10.5)
nRBC: 0 % (ref 0.0–0.2)

## 2020-04-17 LAB — RAPID URINE DRUG SCREEN, HOSP PERFORMED
Amphetamines: NOT DETECTED
Barbiturates: NOT DETECTED
Benzodiazepines: NOT DETECTED
Cocaine: NOT DETECTED
Opiates: NOT DETECTED
Tetrahydrocannabinol: NOT DETECTED

## 2020-04-17 LAB — COMPREHENSIVE METABOLIC PANEL
ALT: 16 U/L (ref 0–44)
AST: 22 U/L (ref 15–41)
Albumin: 4.6 g/dL (ref 3.5–5.0)
Alkaline Phosphatase: 73 U/L (ref 38–126)
Anion gap: 10 (ref 5–15)
BUN: 19 mg/dL (ref 6–20)
CO2: 24 mmol/L (ref 22–32)
Calcium: 9.5 mg/dL (ref 8.9–10.3)
Chloride: 105 mmol/L (ref 98–111)
Creatinine, Ser: 0.79 mg/dL (ref 0.44–1.00)
GFR calc Af Amer: >60 mL/min (ref >60)
GFR calc non Af Amer: >60 mL/min (ref >60)
Glucose, Bld: 99 mg/dL (ref 70–99)
Potassium: 3.5 mmol/L (ref 3.5–5.1)
Sodium: 139 mmol/L (ref 135–145)
Total Bilirubin: 0.3 mg/dL (ref 0.3–1.2)
Total Protein: 8.6 g/dL — ABNORMAL HIGH (ref 6.5–8.1)

## 2020-04-17 LAB — I-STAT BETA HCG BLOOD, ED (MC, WL, AP ONLY): I-stat hCG, quantitative: 11.8 m[IU]/mL — ABNORMAL HIGH (ref <5)

## 2020-04-17 LAB — ETHANOL: Alcohol, Ethyl (B): <10 mg/dL (ref <10)

## 2020-04-17 LAB — SARS CORONAVIRUS 2 BY RT PCR (HOSPITAL ORDER, PERFORMED IN ~~LOC~~ HOSPITAL LAB): SARS Coronavirus 2: NEGATIVE

## 2020-04-17 MED ORDER — ACETAMINOPHEN 500 MG PO TABS
1000.0000 mg | ORAL_TABLET | Freq: Once | ORAL | Status: AC
  Administered 2020-04-18: 1000 mg via ORAL
  Filled 2020-04-17 (×2): qty 2

## 2020-04-17 MED ORDER — PROCHLORPERAZINE MALEATE 10 MG PO TABS
10.0000 mg | ORAL_TABLET | Freq: Once | ORAL | Status: AC
  Administered 2020-04-18: 10 mg via ORAL
  Filled 2020-04-17 (×2): qty 1

## 2020-04-17 MED ORDER — DIPHENHYDRAMINE HCL 25 MG PO CAPS
25.0000 mg | ORAL_CAPSULE | Freq: Once | ORAL | Status: AC
  Administered 2020-04-18: 25 mg via ORAL
  Filled 2020-04-17 (×2): qty 1

## 2020-04-17 NOTE — ED Triage Notes (Signed)
Patient brought in by [employer] for IVC-states she has a history of schizophrenia-went to [facility] but left-founding wandering in the street

## 2020-04-18 MED ORDER — ARIPIPRAZOLE 5 MG PO TABS
15.0000 mg | ORAL_TABLET | Freq: Every day | ORAL | Status: DC
  Administered 2020-04-18: 15 mg via ORAL
  Filled 2020-04-18: qty 1

## 2020-04-18 MED ORDER — CARBAMAZEPINE 100 MG PO CHEW
100.0000 mg | CHEWABLE_TABLET | Freq: Three times a day (TID) | ORAL | Status: DC
  Administered 2020-04-18: 100 mg via ORAL
  Filled 2020-04-18 (×2): qty 1

## 2020-04-18 MED ORDER — ACETAMINOPHEN 325 MG PO TABS: 650.0000 mg | ORAL_TABLET | ORAL | Status: DC | PRN

## 2020-04-18 MED ORDER — HYDROXYZINE HCL 25 MG PO TABS: 25.0000 mg | ORAL_TABLET | Freq: Three times a day (TID) | ORAL | Status: DC | PRN

## 2020-04-18 MED ORDER — RISPERIDONE 1 MG PO TBDP: 4.0000 mg | ORAL_TABLET | Freq: Every day | ORAL | Status: DC

## 2020-04-18 NOTE — BH Assessment (Signed)
[facility] Assessment Progress Note  Per Nelly Rout, MD, this pt does not require psychiatric hospitalization at this time.  Pt presents under IVC initiated by a local housing resource provider which has been rescinded by Dr Lucianne Muss.  Pt is to be discharged from [facility] with follow up appointments at [facility].  At 11:29 I called them and spoke to [place].  She has scheduled pt for intake on Tuesday, 04/22/2020 at 15:00, and for psychiatry on Tuesday, 04/29/2020 at 10:00.  Appointments have been included in pt's discharge instructions.  Pt's nurse has been notified.  Doylene Canning, MA Triage Specialist [phone]

## 2020-04-18 NOTE — ED Notes (Signed)
Pt given oj and graham crackers.

## 2020-04-18 NOTE — ED Provider Notes (Signed)
[place] DEPT Provider Note   CSN: [id number] Arrival date & time: 04/17/20  1413     History Chief Complaint  Patient presents with  . IVC    Stephanie [name] is a 44 y.o. female.  HPI      44 year old female with history of schizophrenia presents under IVC from [facility] after she had presented to [facility] stating that she had suicidal ideation and auditory hallucinations.  She reports that she was at the [facility] and hearing voices, so she went to [place] because she was hearing voices and said that she had thoughts of killing herself.  On my history, she reports she told them that she was having suicidal ideation, but she was lying.  She reports that he continued electric shock sensation in her head which has been ongoing for several years.  Reports that she has been hearing voices.  She was also recently evaluated by me for lightheadedness, at that time was having active auditory hallucinations (hearing me say things I had not) but was not a threat to herself or others and reported that the schizophrenia was just something someone made up about her and that she doesn't need medications.    Today, she acknowledges hearing voices, being off of her medications.  Had earlier reported SI and checked into [facility] and then left and was IVCd by [employer].  Past Medical History:  Diagnosis Date  . Schizophrenia [facility])     Patient Active Problem List   Diagnosis Date Noted  . Schizoaffective disorder ([place]) 03/31/2019    Past Surgical History:  Procedure Laterality Date  . INDUCED ABORTION       OB History   No obstetric history on file.     Family History  Problem Relation Age of Onset  . Cancer Father     Social History   Tobacco Use  . Smoking status: Never Smoker  . Smokeless tobacco: Never Used  Substance Use Topics  . Alcohol use: Never  . Drug use: Never    Home Medications Prior to Admission medications   Medication Sig Start  Date End Date Taking? Authorizing Provider  ARIPiprazole ER (ABILIFY MAINTENA) 400 MG SRER injection Inject 2 mLs (400 mg total) into the muscle every 28 (twenty-eight) days. Due approx 6/13 04/04/19  Yes Johnn Hai, MD  ARIPiprazole (ABILIFY) 15 MG tablet Take 1 tablet (15 mg total) by mouth daily. Patient not taking: Reported on 04/17/2020 04/01/20   Suella Broad, FNP  carbamazepine (TEGRETOL) 100 MG chewable tablet Chew 1 tablet (100 mg total) by mouth 3 (three) times daily. Patient not taking: Reported on 04/17/2020 03/31/20   Suella Broad, FNP  hydrOXYzine (ATARAX/VISTARIL) 25 MG tablet Take 1 tablet (25 mg total) by mouth 3 (three) times daily as needed for anxiety. Patient not taking: Reported on 04/14/2020 03/31/20   Suella Broad, FNP  risperiDONE (RISPERDAL M-TABS) 4 MG disintegrating tablet Take 1 tablet (4 mg total) by mouth at bedtime. Patient not taking: Reported on 04/17/2020 03/31/20   Suella Broad, FNP    Allergies    Haldol [haloperidol lactate], Penicillins, and Penicillins  Review of Systems   Review of Systems  Physical Exam Updated Vital Signs BP 135/78   Temp 98.4 F (36.9 C) (Oral)   Resp 18   SpO2 100%   Physical Exam Vitals and nursing note reviewed.  Constitutional:      General: She is not in acute distress.  Appearance: She is well-developed. She is not diaphoretic.  HENT:     Head: Normocephalic and atraumatic.  Eyes:     Conjunctiva/sclera: Conjunctivae normal.  Cardiovascular:     Rate and Rhythm: Normal rate and regular rhythm.     Heart sounds: Normal heart sounds.  Pulmonary:     Effort: Pulmonary effort is normal. No respiratory distress.  Abdominal:     General: There is no distension.     Palpations: Abdomen is soft.     Tenderness: There is no abdominal tenderness. There is no guarding.  Musculoskeletal:        General: No tenderness.     Cervical back: Normal range of motion.  Skin:    General:  Skin is warm and dry.     Findings: No erythema or rash.  Neurological:     Mental Status: She is alert and oriented to person, place, and time.  Psychiatric:        Attention and Perception: She perceives auditory hallucinations.        Mood and Affect: Affect is labile.        Thought Content: Thought content does not include homicidal ideation. Suicidal: denies at this tiem but stated earlier today. Thought content does not include homicidal or suicidal plan.     ED Results / Procedures / Treatments   Labs (all labs ordered are listed, but only abnormal results are displayed) Labs Reviewed  COMPREHENSIVE METABOLIC PANEL - Abnormal; Notable for the following components:      Result Value   Total Protein 8.6 (*)    All other components within normal limits  CBC WITH DIFFERENTIAL/PLATELET - Abnormal; Notable for the following components:   Hemoglobin 11.2 (*)    RDW 17.6 (*)    All other components within normal limits  I-STAT BETA HCG BLOOD, ED ([facility], [facility], [facility] ONLY) - Abnormal; Notable for the following components:   I-stat hCG, quantitative 11.8 (*)    All other components within normal limits  SARS CORONAVIRUS 2 BY RT PCR (HOSPITAL ORDER, PERFORMED IN [facility])  ETHANOL  RAPID URINE DRUG SCREEN, HOSP PERFORMED    EKG None  Radiology No results found.  Procedures Procedures (including critical care time)  Medications Ordered in ED Medications  acetaminophen (TYLENOL) tablet 1,000 mg (1,000 mg Oral Given 04/18/20 0017)  prochlorperazine (COMPAZINE) tablet 10 mg (10 mg Oral Given 04/18/20 0017)  diphenhydrAMINE (BENADRYL) capsule 25 mg (25 mg Oral Given 04/18/20 0017)    ED Course  I have reviewed the triage vital signs and the nursing notes.  Pertinent labs & imaging results that were available during my care of the patient were reviewed by me and considered in my medical decision making (see chart for details).    MDM Rules/Calculators/A&P                       44 year old female with history of schizophrenia presents under IVC from [facility] after she had presented to [facility] stating that she had suicidal ideation and auditory hallucinations.  No medical concerns today, labs stable, hcg with chronic mild elevation.  She is medically cleared.  She does appear psychotic and given she had described suicidal ideation previously today (even though denies now) will IVC as I feel she will benefit from further care.     Final Clinical Impression(s) / ED Diagnoses Final diagnoses:  Auditory hallucinations  Suicidal ideation    Rx / DC Orders ED  Discharge Orders    None       Alvira Monday, MD 04/18/20 [phone]
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
## 2020-04-18 NOTE — Consult Note (Signed)
  Patient seen and reassessed with Dr. Lucianne Muss. Patient is alert and oriented. At the time of the evaluation she is pleasant and cooperative. She states she came in last night after she felt "electric shocks in her head." Today she is doing better and is observed smiling and engaging well with Clinical research associate. She is open to therapy and a follow up has been placed at [facility] next week for medication management.She denies suicidal ideations homicidal ideations, and or hallucinations.  Will psych clear and discharge at this time.

## 2020-04-18 NOTE — ED Notes (Signed)
Pt doing tts at this time

## 2020-04-18 NOTE — Discharge Instructions (Signed)
For your behavioral health needs you are advised to follow up with [facility].  You have an intake appointment with Richardson Dopp, LCSW scheduled for Tuesday, April 22, 2020 at 3:00 pm.  You also have a psychiatry appointment with Peter Congo, NP scheduled for Tuesday, April 29, 2020 at 10:00 am:       [facility]      [address]      [place], [place] [address]      [phone]

## 2020-04-22 ENCOUNTER — Ambulatory Visit (HOSPITAL_COMMUNITY): Payer: Federal, State, Local not specified - Other | Admitting: Licensed Clinical Social Worker

## 2020-04-29 ENCOUNTER — Encounter (HOSPITAL_COMMUNITY): Payer: Self-pay | Admitting: Psychiatry

## 2020-04-29 ENCOUNTER — Ambulatory Visit (HOSPITAL_COMMUNITY): Payer: Self-pay | Admitting: Psychiatry

## 2020-04-29 ENCOUNTER — Other Ambulatory Visit: Payer: Self-pay

## 2020-08-06 DIAGNOSIS — F209 Schizophrenia, unspecified: Secondary | ICD-10-CM | POA: Diagnosis present

## 2020-10-08 ENCOUNTER — Other Ambulatory Visit: Payer: Self-pay

## 2020-10-08 DIAGNOSIS — F250 Schizoaffective disorder, bipolar type: Secondary | ICD-10-CM | POA: Insufficient documentation
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
## 2020-10-08 NOTE — Discharge Instructions (Signed)
Discharge to ED

## 2020-10-09 ENCOUNTER — Ambulatory Visit (HOSPITAL_COMMUNITY)
Admission: EM | Admit: 2020-10-09 | Discharge: 2020-10-09 | Disposition: A | Discharge status: Other Acute Inpatient Hospital | Payer: No Payment, Other | Attending: Psychiatry | Admitting: Psychiatry

## 2020-10-09 ENCOUNTER — Encounter (HOSPITAL_COMMUNITY): Payer: Self-pay | Admitting: *Deleted

## 2020-10-09 ENCOUNTER — Other Ambulatory Visit: Payer: Self-pay

## 2020-10-09 ENCOUNTER — Emergency Department (HOSPITAL_COMMUNITY)
Admission: EM | Admit: 2020-10-09 | Discharge: 2020-10-10 | Disposition: A | Discharge status: Home / Self Care | Payer: Self-pay | Attending: Emergency Medicine | Admitting: Emergency Medicine

## 2020-10-09 DIAGNOSIS — Z046 Encounter for general psychiatric examination, requested by authority: Secondary | ICD-10-CM

## 2020-10-09 DIAGNOSIS — R259 Unspecified abnormal involuntary movements: Secondary | ICD-10-CM | POA: Insufficient documentation

## 2020-10-09 DIAGNOSIS — F250 Schizoaffective disorder, bipolar type: Secondary | ICD-10-CM | POA: Insufficient documentation

## 2020-10-09 DIAGNOSIS — Z20822 Contact with and (suspected) exposure to covid-19: Secondary | ICD-10-CM | POA: Insufficient documentation

## 2020-10-09 LAB — CBC
HCT: 40.9 % (ref 36.0–46.0)
Hemoglobin: 12.4 g/dL (ref 12.0–15.0)
MCH: 25.3 pg — ABNORMAL LOW (ref 26.0–34.0)
MCHC: 30.3 g/dL (ref 30.0–36.0)
MCV: 83.3 fL (ref 80.0–100.0)
Platelets: 374 10*3/uL (ref 150–400)
RBC: 4.91 MIL/uL (ref 3.87–5.11)
RDW: 15.8 % — ABNORMAL HIGH (ref 11.5–15.5)
WBC: 6.6 10*3/uL (ref 4.0–10.5)
nRBC: 0 % (ref 0.0–0.2)

## 2020-10-09 LAB — RAPID URINE DRUG SCREEN, HOSP PERFORMED
Amphetamines: NOT DETECTED
Barbiturates: NOT DETECTED
Benzodiazepines: NOT DETECTED
Cocaine: NOT DETECTED
Opiates: NOT DETECTED
Tetrahydrocannabinol: NOT DETECTED

## 2020-10-09 LAB — COMPREHENSIVE METABOLIC PANEL
ALT: 21 U/L (ref 0–44)
AST: 21 U/L (ref 15–41)
Albumin: 4.5 g/dL (ref 3.5–5.0)
Alkaline Phosphatase: 95 U/L (ref 38–126)
Anion gap: 15 (ref 5–15)
BUN: 13 mg/dL (ref 6–20)
CO2: 24 mmol/L (ref 22–32)
Calcium: 10.2 mg/dL (ref 8.9–10.3)
Chloride: 100 mmol/L (ref 98–111)
Creatinine, Ser: 1.07 mg/dL — ABNORMAL HIGH (ref 0.44–1.00)
GFR, Estimated: >60 mL/min (ref >60)
Glucose, Bld: 83 mg/dL (ref 70–99)
Potassium: 3.7 mmol/L (ref 3.5–5.1)
Sodium: 139 mmol/L (ref 135–145)
Total Bilirubin: 0.6 mg/dL (ref 0.3–1.2)
Total Protein: 9.1 g/dL — ABNORMAL HIGH (ref 6.5–8.1)

## 2020-10-09 LAB — I-STAT BETA HCG BLOOD, ED (MC, WL, AP ONLY): I-stat hCG, quantitative: <5 m[IU]/mL (ref <5)

## 2020-10-09 LAB — RESPIRATORY PANEL BY RT PCR (FLU A&B, COVID)
Influenza A by PCR: NEGATIVE
Influenza B by PCR: NEGATIVE
SARS Coronavirus 2 by RT PCR: NEGATIVE

## 2020-10-09 LAB — SALICYLATE LEVEL: Salicylate Lvl: <7 mg/dL — ABNORMAL LOW (ref 7.0–30.0)

## 2020-10-09 LAB — ACETAMINOPHEN LEVEL: Acetaminophen (Tylenol), Serum: <10 ug/mL — ABNORMAL LOW (ref 10–30)

## 2020-10-09 LAB — ETHANOL: Alcohol, Ethyl (B): <10 mg/dL (ref <10)

## 2020-10-09 MED ORDER — LORAZEPAM 1 MG PO TABS: 1.0000 mg | ORAL_TABLET | Freq: Once | ORAL | Status: DC | PRN

## 2020-10-09 MED ORDER — ZOLPIDEM TARTRATE 5 MG PO TABS: 5.0000 mg | ORAL_TABLET | Freq: Every evening | ORAL | Status: DC | PRN

## 2020-10-09 MED ORDER — ACETAMINOPHEN 325 MG PO TABS: 650.0000 mg | ORAL_TABLET | ORAL | Status: DC | PRN

## 2020-10-09 MED ORDER — ZIPRASIDONE MESYLATE 20 MG IM SOLR
10.0000 mg | Freq: Once | INTRAMUSCULAR | Status: AC
  Administered 2020-10-09: 10 mg via INTRAMUSCULAR
  Filled 2020-10-09: qty 20

## 2020-10-09 MED ORDER — ALUM & MAG HYDROXIDE-SIMETH 200-200-20 MG/5ML PO SUSP: 30.0000 mL | Freq: Four times a day (QID) | ORAL | Status: DC | PRN

## 2020-10-09 MED ORDER — ONDANSETRON HCL 4 MG PO TABS: 4.0000 mg | ORAL_TABLET | Freq: Three times a day (TID) | ORAL | Status: DC | PRN

## 2020-10-09 MED ORDER — NICOTINE 21 MG/24HR TD PT24: 21.0000 mg | MEDICATED_PATCH | Freq: Every day | TRANSDERMAL | Status: DC | PRN

## 2020-10-09 NOTE — ED Notes (Signed)
Pt ambulatory with steady gait to bathroom
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
## 2020-10-09 NOTE — ED Notes (Signed)
TTS in process at this time  

## 2020-10-09 NOTE — ED Notes (Signed)
Pt cursing out loud to self, pt states " tell them to stop talking to me." Pt refuses COVID swab for testing at this time.
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
## 2020-10-09 NOTE — ED Notes (Signed)
Pt resting in bed. NADN. Sitter present at bedside.

## 2020-10-09 NOTE — ED Notes (Signed)
Ordered breakfast 

## 2020-10-09 NOTE — BH Assessment (Signed)
Clinician sent Kerrie Pleasure, RN a message via secure chat in Epic asked her to fax [phone]) pt's IVC paperwork, once pt's paperwork is received clinician will complete pt's assessment.    Redmond Pulling, MS, [facility], [place] Triage Specialist [phone]
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
## 2020-10-09 NOTE — ED Notes (Signed)
Pt now resting quietly in bed.
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
## 2020-10-09 NOTE — ED Notes (Addendum)
Pt resting in stretcher, NADN, sitter precautions in place, side rails up. Pt calm and cooperative
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
## 2020-10-09 NOTE — ED Notes (Signed)
Pt having outburst cursing in hallway stating " stop talking about my damn family because they are all dead."

## 2020-10-09 NOTE — ED Notes (Signed)
Pt resting in bed. Sitter at bedside. Calm.

## 2020-10-09 NOTE — ED Notes (Signed)
Lunch Tray Ordered @ 1000. 

## 2020-10-10 ENCOUNTER — Other Ambulatory Visit: Payer: Self-pay

## 2020-10-10 ENCOUNTER — Encounter (HOSPITAL_COMMUNITY): Payer: Self-pay | Admitting: Psychiatry

## 2020-10-10 ENCOUNTER — Inpatient Hospital Stay (HOSPITAL_COMMUNITY)
Admission: AD | Admit: 2020-10-10 | Discharge: 2020-10-15 | DRG: 885 | Disposition: A | Discharge status: Home / Self Care | Payer: No Typology Code available for payment source | Attending: Psychiatry | Admitting: Psychiatry

## 2020-10-10 DIAGNOSIS — Z20822 Contact with and (suspected) exposure to covid-19: Secondary | ICD-10-CM | POA: Diagnosis present

## 2020-10-10 DIAGNOSIS — Z79899 Other long term (current) drug therapy: Secondary | ICD-10-CM

## 2020-10-10 DIAGNOSIS — Z9114 Patient's other noncompliance with medication regimen: Secondary | ICD-10-CM

## 2020-10-10 DIAGNOSIS — F209 Schizophrenia, unspecified: Secondary | ICD-10-CM | POA: Diagnosis present

## 2020-10-10 DIAGNOSIS — G4700 Insomnia, unspecified: Secondary | ICD-10-CM | POA: Diagnosis present

## 2020-10-10 DIAGNOSIS — F419 Anxiety disorder, unspecified: Secondary | ICD-10-CM | POA: Diagnosis present

## 2020-10-10 DIAGNOSIS — F200 Paranoid schizophrenia: Secondary | ICD-10-CM | POA: Diagnosis not present

## 2020-10-10 MED ORDER — ZIPRASIDONE MESYLATE 20 MG IM SOLR: 20.0000 mg | Freq: Four times a day (QID) | INTRAMUSCULAR | Status: DC | PRN

## 2020-10-10 MED ORDER — RISPERIDONE 2 MG PO TBDP
2.0000 mg | ORAL_TABLET | Freq: Three times a day (TID) | ORAL | Status: DC | PRN
  Administered 2020-10-12: 2 mg via ORAL
  Filled 2020-10-10: qty 1

## 2020-10-10 MED ORDER — HYDROXYZINE HCL 25 MG PO TABS
25.0000 mg | ORAL_TABLET | Freq: Three times a day (TID) | ORAL | Status: DC | PRN
  Filled 2020-10-10: qty 1
  Filled 2020-10-10: qty 10

## 2020-10-10 MED ORDER — ACETAMINOPHEN 325 MG PO TABS
650.0000 mg | ORAL_TABLET | Freq: Four times a day (QID) | ORAL | Status: DC | PRN
  Administered 2020-10-12: 650 mg via ORAL
  Filled 2020-10-10: qty 2

## 2020-10-10 MED ORDER — LORAZEPAM 1 MG PO TABS
1.0000 mg | ORAL_TABLET | ORAL | Status: DC | PRN
  Administered 2020-10-12: 1 mg via ORAL
  Filled 2020-10-10: qty 1

## 2020-10-10 MED ORDER — TRAZODONE HCL 50 MG PO TABS
50.0000 mg | ORAL_TABLET | Freq: Every evening | ORAL | Status: DC | PRN
  Administered 2020-10-10: 50 mg via ORAL
  Filled 2020-10-10: qty 7
  Filled 2020-10-10 (×3): qty 1

## 2020-10-10 MED ORDER — ALUM & MAG HYDROXIDE-SIMETH 200-200-20 MG/5ML PO SUSP: 30.0000 mL | ORAL | Status: DC | PRN

## 2020-10-10 MED ORDER — ARIPIPRAZOLE 15 MG PO TABS
15.0000 mg | ORAL_TABLET | Freq: Every day | ORAL | Status: DC
  Administered 2020-10-10: 15 mg via ORAL
  Filled 2020-10-10 (×3): qty 1

## 2020-10-10 MED ORDER — RISPERIDONE 2 MG PO TBDP
4.0000 mg | ORAL_TABLET | Freq: Every day | ORAL | Status: DC
  Administered 2020-10-10 – 2020-10-14 (×5): 4 mg via ORAL
  Filled 2020-10-10: qty 14
  Filled 2020-10-10 (×8): qty 2

## 2020-10-10 MED ORDER — CARBAMAZEPINE 100 MG PO CHEW
100.0000 mg | CHEWABLE_TABLET | Freq: Three times a day (TID) | ORAL | Status: DC
  Administered 2020-10-10 – 2020-10-15 (×12): 100 mg via ORAL
  Filled 2020-10-10 (×6): qty 1
  Filled 2020-10-10: qty 21
  Filled 2020-10-10 (×3): qty 1
  Filled 2020-10-10: qty 21
  Filled 2020-10-10 (×2): qty 1
  Filled 2020-10-10: qty 21
  Filled 2020-10-10 (×6): qty 1

## 2020-10-10 MED ORDER — MAGNESIUM HYDROXIDE 400 MG/5ML PO SUSP: 30.0000 mL | Freq: Every day | ORAL | Status: DC | PRN

## 2020-10-10 NOTE — ED Notes (Signed)
Pt continues resting in stretcher, si precautions remain in place, pt sleeping but easily arousable.

## 2020-10-10 NOTE — ED Notes (Signed)
Pt given 3 packs of graham crackers. 

## 2020-10-10 NOTE — [facility] Counselor (Signed)
10/10/2020: Per Hassie Bruce, RN pt accepted to [facility] pending discharged, day shift AC to coordinate.    Redmond Pulling, MS, [facility], [facility] Triage Specialist [phone]

## 2020-10-10 NOTE — ED Notes (Signed)
Spoke to [facility], Pt accepted after 1230.
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
## 2020-10-10 NOTE — Tx Team (Signed)
Initial Treatment Plan 10/10/2020 1:58 PM [name] TIW:[id number]    PATIENT STRESSORS: Medication change or noncompliance Other: Chronic mental illness   PATIENT STRENGTHS: Capable of independent living General fund of knowledge Physical Health   PATIENT IDENTIFIED PROBLEMS: Psychosis      "I want to be able to focus better"  "To have better attention span"             DISCHARGE CRITERIA:  Improved stabilization in mood, thinking, and/or behavior Need for constant or close observation no longer present Reduction of life-threatening or endangering symptoms to within safe limits Verbal commitment to aftercare and medication compliance  PRELIMINARY DISCHARGE PLAN: Medication management  PATIENT/FAMILY INVOLVEMENT: This treatment plan has been presented to and reviewed with the patient, Stephanie [name].  The patient and family have been given the opportunity to ask questions and make suggestions.  Levin Bacon, RN 10/10/2020, 1:58 PM
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
## 2020-10-10 NOTE — Progress Notes (Signed)
   10/10/20 2256  Psych Admission Type (Psych Patients Only)  Admission Status Involuntary  Psychosocial Assessment  Patient Complaints None  Eye Contact Brief  Facial Expression Flat  Affect Blunted  Speech Soft;Slow  Interaction Cautious;Forwards little;No initiation;Minimal  Motor Activity Other (Comment) (wnl)  Appearance/Hygiene Disheveled  Behavior Characteristics Cooperative;Guarded  Mood Pleasant  Thought Process  Coherency Circumstantial  Content WDL  Delusions None reported or observed  Perception WDL  Hallucination None reported or observed  Judgment Impaired  Confusion None  Danger to Self  Current suicidal ideation? Denies  Danger to Others  Danger to Others None reported or observed   Pt in room wrapped in a blanket lying on the bench. Pt states it is more comfortable than the beds. Pt forwards little and is guarded. Denies SI, HI, AVH and pain.

## 2020-10-10 NOTE — ED Notes (Signed)
Report to Will, RN of [facility]. [employer] in bound for patient transport.

## 2020-10-10 NOTE — Progress Notes (Signed)
Pt accepted to [facility], bed 500-1   Reola Calkins, NP is the accepting provider.    Dr. Jola Babinski is the attending provider.    Call report to [phone]    Dylan @ [facility] ED notified.     Pt is involuntary and will be transported by law enforcement  Pt is scheduled to arrive at [facility] after 1230pm.   Wells Guiles, MSW, LCSW, LCAS Clinical Social Worker II Disposition CSW [phone]

## 2020-10-10 NOTE — ED Notes (Signed)
Pt resting in bed. Awake, Calm, and oriented at this time. Will continue to monitor. Sitter at bedside.

## 2020-10-10 NOTE — Progress Notes (Signed)
SPIRITUALITY GROUP NOTE  Pt attended spirituality group facilitated by Wilkie Aye, MDIv, BCC.  Group Description:  Group focused on topic of hope.  Patients participated in facilitated discussion around topic, connecting with one another around experiences and definitions for hope.  Group members engaged with visual explorer photos, reflecting on what hope looks like for them today.  Group engaged in discussion around how their definitions of hope are present today in hospital.   Modalities: Psycho-social ed, Adlerian, Narrative, MI  Patient Progress:   Did not attend
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
## 2020-10-11 DIAGNOSIS — F200 Paranoid schizophrenia: Secondary | ICD-10-CM | POA: Diagnosis not present

## 2020-10-11 MED ORDER — ARIPIPRAZOLE ER 400 MG IM SRER
400.0000 mg | Freq: Once | INTRAMUSCULAR | Status: DC
  Filled 2020-10-11: qty 2

## 2020-10-11 MED ORDER — OLANZAPINE 10 MG IM SOLR: 5.0000 mg | Freq: Two times a day (BID) | INTRAMUSCULAR | Status: DC | PRN

## 2020-10-11 MED ORDER — ARIPIPRAZOLE ER 400 MG IM SRER
400.0000 mg | INTRAMUSCULAR | Status: AC
  Administered 2020-10-11: 400 mg via INTRAMUSCULAR

## 2020-10-11 MED ORDER — ARIPIPRAZOLE 10 MG PO TABS
20.0000 mg | ORAL_TABLET | Freq: Every day | ORAL | Status: DC
  Administered 2020-10-12 – 2020-10-15 (×4): 20 mg via ORAL
  Filled 2020-10-11 (×4): qty 2
  Filled 2020-10-11: qty 14
  Filled 2020-10-11: qty 2

## 2020-10-11 NOTE — Progress Notes (Signed)
[facility] MD Progress Note  10/11/2020 12:30 PM Stephanie [name]  MRN:  [id number] Subjective: Patient is a 44 year old female with a past psychiatric history significant for schizophrenia who presented to the [facility] emergency department on 10/09/2020 secondary to paranoia.  She had presented to the behavioral health urgent care center under involuntary commitment, but because the involuntary commitment had to be transferred to [facility].  She was then transferred to our facility.  Objective: Patient is seen and examined.  Patient is a 44 year old female with the above-stated past psychiatric history who is seen in follow-up.  She has refused all oral medications.  We attempted to give her long-acting Abilify injection this morning, but she has refused that.  She stated that she is not schizophrenic, and that we have no right doing this to her.  I discussed with her the fact that we would probably pursue forced medication protocol on her, and I prefer that we did this in a more collaborative way, but she has declined that.  She is essentially unchanged from her admission note last night.  Review of her laboratories from 11/18 showed no new laboratories overnight.  She has refused medicines as well as the EKG.  Her vital signs are stable, she is afebrile.  She slept 6.75 hours last night.  Principal Problem: <principal problem not specified> Diagnosis: Active Problems:   Schizophrenia (HCC)  Total Time spent with patient: 20 minutes  Past Psychiatric History: See admission H&P  Past Medical History:  Past Medical History:  Diagnosis Date  . Schizophrenia [facility])     Past Surgical History:  Procedure Laterality Date  . INDUCED ABORTION     Family History:  Family History  Problem Relation Age of Onset  . Cancer Father    Family Psychiatric  History: See admission H&P Social History:  Social History   Substance and Sexual Activity  Alcohol Use Never     Social History    Substance and Sexual Activity  Drug Use Never    Social History   Socioeconomic History  . Marital status: Unknown    Spouse name: Not on file  . Number of children: Not on file  . Years of education: Not on file  . Highest education level: Not on file  Occupational History  . Not on file  Tobacco Use  . Smoking status: Never Smoker  . Smokeless tobacco: Never Used  Vaping Use  . Vaping Use: Former  Substance and Sexual Activity  . Alcohol use: Never  . Drug use: Never  . Sexual activity: Never  Other Topics Concern  . Not on file  Social History Narrative   ** Merged History Encounter **       Social Determinants of Health   Financial Resource Strain:   . Difficulty of Paying Living Expenses: Not on file  Food Insecurity:   . Worried About Programme researcher, broadcasting/film/video in the Last Year: Not on file  . Ran Out of Food in the Last Year: Not on file  Transportation Needs:   . Lack of Transportation (Medical): Not on file  . Lack of Transportation (Non-Medical): Not on file  Physical Activity:   . Days of Exercise per Week: Not on file  . Minutes of Exercise per Session: Not on file  Stress:   . Feeling of Stress : Not on file  Social Connections:   . Frequency of Communication with Friends and Family: Not on file  . Frequency of Social  Gatherings with Friends and Family: Not on file  . Attends Religious Services: Not on file  . Active Member of Clubs or Organizations: Not on file  . Attends Banker Meetings: Not on file  . Marital Status: Not on file   Additional Social History:                         Sleep: Good  Appetite:  Good  Current Medications: Current Facility-Administered Medications  Medication Dose Route Frequency Provider Last Rate Last Admin  . acetaminophen (TYLENOL) tablet 650 mg  650 mg Oral Q6H PRN Antonieta Pert, MD      . alum & mag hydroxide-simeth (MAALOX/MYLANTA) 200-200-20 MG/5ML suspension 30 mL  30 mL Oral Q4H  PRN Antonieta Pert, MD      . Melene Muller ON 10/12/2020] ARIPiprazole (ABILIFY) tablet 20 mg  20 mg Oral Daily Antonieta Pert, MD      . ARIPiprazole ER (ABILIFY MAINTENA) injection 400 mg  400 mg Intramuscular Once Antonieta Pert, MD      . carbamazepine (TEGRETOL) chewable tablet 100 mg  100 mg Oral TID Antonieta Pert, MD   100 mg at 10/10/20 1723  . hydrOXYzine (ATARAX/VISTARIL) tablet 25 mg  25 mg Oral TID PRN Antonieta Pert, MD      . risperiDONE (RISPERDAL M-TABS) disintegrating tablet 2 mg  2 mg Oral Q8H PRN Antonieta Pert, MD       And  . LORazepam (ATIVAN) tablet 1 mg  1 mg Oral PRN Antonieta Pert, MD       And  . ziprasidone (GEODON) injection 20 mg  20 mg Intramuscular Q6H PRN Antonieta Pert, MD      . magnesium hydroxide (MILK OF MAGNESIA) suspension 30 mL  30 mL Oral Daily PRN Antonieta Pert, MD      . risperiDONE (RISPERDAL M-TABS) disintegrating tablet 4 mg  4 mg Oral QHS Antonieta Pert, MD   4 mg at 10/10/20 2057  . traZODone (DESYREL) tablet 50 mg  50 mg Oral QHS PRN Antonieta Pert, MD   50 mg at 10/10/20 2057    Lab Results:  Results for orders placed or performed during the hospital encounter of 10/09/20 (from the past 48 hour(s))  Rapid urine drug screen (hospital performed)     Status: None   Collection Time: 10/09/20  6:45 PM  Result Value Ref Range   Opiates NONE DETECTED NONE DETECTED   Cocaine NONE DETECTED NONE DETECTED   Benzodiazepines NONE DETECTED NONE DETECTED   Amphetamines NONE DETECTED NONE DETECTED   Tetrahydrocannabinol NONE DETECTED NONE DETECTED   Barbiturates NONE DETECTED NONE DETECTED    Comment: (NOTE) DRUG SCREEN FOR MEDICAL PURPOSES ONLY.  IF CONFIRMATION IS NEEDED FOR ANY PURPOSE, NOTIFY LAB WITHIN 5 DAYS.  LOWEST DETECTABLE LIMITS FOR URINE DRUG SCREEN Drug Class                     Cutoff (ng/mL) Amphetamine and metabolites    1000 Barbiturate and metabolites    200 Benzodiazepine                  200 Tricyclics and metabolites     300 Opiates and metabolites        300 Cocaine and metabolites        300 THC  50 Performed at [facility], [address], [place], [place] [address]     Blood Alcohol level:  Lab Results  Component Value Date   [facility] <10 10/09/2020   ETH <10 04/17/2020    Metabolic Disorder Labs: Lab Results  Component Value Date   HGBA1C 6.6 (H) 03/26/2020   MPG 142.72 03/26/2020   No results found for: PROLACTIN Lab Results  Component Value Date   CHOL 173 03/26/2020   TRIG 67 03/26/2020   HDL 47 03/26/2020   CHOLHDL 3.7 03/26/2020   VLDL 13 03/26/2020   LDLCALC 113 (H) 03/26/2020    Physical Findings: AIMS: Facial and Oral Movements Muscles of Facial Expression: None, normal Lips and Perioral Area: None, normal Jaw: None, normal Tongue: None, normal,Extremity Movements Upper (arms, wrists, hands, fingers): None, normal Lower (legs, knees, ankles, toes): None, normal, Trunk Movements Neck, shoulders, hips: None, normal, Overall Severity Severity of abnormal movements (highest score from questions above): None, normal Incapacitation due to abnormal movements: None, normal Patient's awareness of abnormal movements (rate only patient's report): No Awareness, Dental Status Current problems with teeth and/or dentures?: No Does patient usually wear dentures?: No  CIWA:    COWS:     Musculoskeletal: Strength & Muscle Tone: within normal limits Gait & Station: normal Patient leans: N/A  Psychiatric Specialty Exam: Physical Exam Vitals and nursing note reviewed.  HENT:     Head: Normocephalic and atraumatic.  Pulmonary:     Effort: Pulmonary effort is normal.  Neurological:     General: No focal deficit present.     Mental Status: She is alert and oriented to person, place, and time.     Review of Systems  Blood pressure 122/62, pulse 64, temperature 99.3 F (37.4 C), temperature source Oral, resp. rate  18, height 5\' 1"  (1.549 m), weight 71.7 kg, SpO2 100 %.Body mass index is 29.85 kg/m.  General Appearance: Disheveled  Eye Contact:  Good  Speech:  Pressured  Volume:  Increased  Mood:  Dysphoric and Irritable  Affect:  Labile  Thought Process:  Goal Directed and Descriptions of Associations: Loose  Orientation:  Negative  Thought Content:  Delusions, Hallucinations: Auditory and Paranoid Ideation  Suicidal Thoughts:  No  Homicidal Thoughts:  No  Memory:  Immediate;   Poor Recent;   Poor Remote;   Poor  Judgement:  Impaired  Insight:  Lacking  Psychomotor Activity:  Increased  Concentration:  Concentration: Poor and Attention Span: Poor  Recall:  Poor  Fund of Knowledge:  Poor  Language:  Good  Akathisia:  Negative  Handed:  Right  AIMS (if indicated):     Assets:  Desire for Improvement Resilience  ADL's:  Impaired  Cognition:  WNL  Sleep:  Number of Hours: 6.75     Treatment Plan Summary: Daily contact with patient to assess and evaluate symptoms and progress in treatment, Medication management and Plan : Patient is seen and examined.  Patient is a 44 year old female with the above-stated past psychiatric history who is seen in follow-up.   Diagnosis: 1.  Schizophrenia.  Pertinent findings on examination today: 1.  Patient is paranoid and resistant to medications. 2.  Patient has total lack of insight and denies any previous history of schizophrenia. 3.  Attempted to discuss with patient a willingness to take medications, but she has refused that.  Plan: 1.  We will request consultation for forced medication protocol. 2.  We will go on and give her the long-acting Abilify  injection today if we can get the forced medication protocol in place. 3.  Continue Abilify 20 mg p.o. daily for psychosis. 4.  Continue Tegretol 100 mg p.o. 3 times daily for mood stability and decreased irritability. 5.  Continue Risperdal 4 mg p.o. nightly for psychosis. 6.  Continue  hydroxyzine 25 mg p.o. 3 times daily as needed anxiety. 7.  Continue trazodone 50 mg p.o. nightly as needed insomnia. 8.  Abilify long-acting injection 400 mg IM x1. 9.  Disposition planning-in progress.  Antonieta PertGreg Lawson Sairah Knobloch, MD 10/11/2020, 12:30 PM
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
## 2020-10-11 NOTE — Progress Notes (Signed)
Patient difficult to assess as she responded "no" to all the assessment questions (SI/HI, AVH, depression, anxiety, sleep, appetite). On approach, the patient was observed covered up in a blanket and sleeping on the bench in her room. She presented to the medication window to take her scheduled medications, but instead cheeked her medications. When this writer confronted the patient about cheeking her medications and asked her to swallow the medications, she then spit the medications into the cup of water, walked off to her room, and came back up to the nurses station with a bag of trash and dumped it into the trashcan. Dr. Jola Babinski was made aware of the patient's behaviors. The patient then refused the Abilify Maintena, stating that she have the right to refuse medications. Dr. Jola Babinski spoke with the patient and provided education. The patient continued to refuse the injection. The patient was later administered the Abilify Maintena per force med order, with a show of force. The patient did not have to be manually held and agreed to let this writer administered the medication.   Orders reviewed. Vital signs reviewed. Verbal support provided. 15 minute checks performed for safety. Patient monitored for medication side effects.
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
## 2020-10-11 NOTE — [facility] Suicide Risk Assessment (Signed)
[facility] INPATIENT:  Family/Significant Other Suicide Prevention Education  Suicide Prevention Education:  Patient Refusal for Family/Significant Other Suicide Prevention Education: The patient Stephanie [name] has refused to provide written consent for family/significant other to be provided Family/Significant Other Suicide Prevention Education during admission and/or prior to discharge. CSW attempted to provide suicide prevention education information to the Patient however she refused. Physician notified.  Evorn Gong 10/11/2020, 5:18 PM

## 2020-10-12 DIAGNOSIS — F200 Paranoid schizophrenia: Secondary | ICD-10-CM | POA: Diagnosis not present

## 2020-10-12 NOTE — Progress Notes (Signed)
Patient has been isolative to her room tonight. She was encouraged to have snack before her medicine but declined. She reported that she remembered me from the last time she was here. She was compliant with her medication. She remains on her bench in her room to sit and sleep. Support given and safety maintained   10/11/20 2115  Psych Admission Type (Psych Patients Only)  Admission Status Involuntary  Psychosocial Assessment  Patient Complaints None  Eye Contact Brief  Facial Expression Flat  Affect Appropriate to circumstance;Blunted  Speech Soft;Slow  Interaction No initiation;Minimal;Isolative  Motor Activity Slow  Appearance/Hygiene Disheveled  Behavior Characteristics Appropriate to situation;Unwilling to participate  Mood Preoccupied;Pleasant  Thought Process  Coherency Concrete thinking;Circumstantial  Content Blaming others;Preoccupation;Paranoia  Delusions Paranoid  Perception Derealization  Hallucination None reported or observed  Judgment Impaired  Confusion None  Danger to Self  Current suicidal ideation? Denies  Danger to Others  Danger to Others None reported or observed   with 15 min checks.

## 2020-10-12 NOTE — [facility] Group Notes (Signed)
[facility] LCSW Group Therapy Note  Date/Time:  10/12/2020 11:00 AM-11:56 AM  Type of Therapy and Topic:  Group Therapy:  Feelings About Hospitalization  Participation Level:  Minimal  Description of Group This process group involved patients discussing their feelings related to being hospitalized, as well as the benefits they see to being in the hospital.  These feelings and benefits were itemized.  The group then brainstormed specific ways in which they could seek those same benefits when they discharge and return home.  Therapeutic Goals 1. Patient will identify and describe positive and negative feelings related to hospitalization 2. Patient will verbalize benefits of hospitalization to themselves personally 3. Patients will brainstorm together ways they can obtain similar benefits in the outpatient setting, identify barriers to wellness and possible solutions 4. Patients will commit to one change upon discharge.   Summary of Patient Progress:  Patient came into group late and left group early. Pt shared "this is a good experience compared to where I was before here".   Therapeutic Modalities Cognitive Behavioral Therapy Motivational Interviewing   Raeanne Gathers, LCSW 10/12/2020, 12:27 PM

## 2020-10-12 NOTE — Progress Notes (Signed)
D:  Patient denied SI and HI, contracts for safety.  Denied A/V hallucinations.  Denied pain. A:  Medications administered per MD orders.  Emotional support and encouragement given patient. R:  Safety maintained with 15 minute checks. Patient laid on the bench in her room most of the day with her pillow and blanket.  Patient smiled and talked pleasantly with nurse when she entered her room.

## 2020-10-12 NOTE — [facility] Group Notes (Signed)
Adult Psychoeducational Group Note  Date:  10/12/2020 Time:  10:09 PM  Group Topic/Focus:  Wrap-Up Group:   The focus of this group is to help patients review their daily goal of treatment and discuss progress on daily workbooks.  Participation Level:  Active  Participation Quality:  Appropriate and Attentive  Affect:  Appropriate  Cognitive:  Alert and Appropriate  Insight: Appropriate and Good  Engagement in Group:  Engaged  Modes of Intervention:  Discussion and Education  Additional Comments:  Pt attended and participated in wrapup group this evening and rated their day an 8/10. Pt stated that they had a good day, due to them completing their goal to go to groups. Pt enjoyed the discussions that they had in group.  Chrisandra Netters 10/12/2020, 10:09 PM

## 2020-10-12 NOTE — Plan of Care (Signed)
Nurse discussed anxiety, depression and coping skills with patient.  

## 2020-10-12 NOTE — Progress Notes (Signed)
[facility] MD Progress Note  10/12/2020 1:33 PM Stephanie [name]  MRN:  [id number] Subjective:  Patient is a 44 year old female with a past psychiatric history significant for schizophrenia who presented to the [facility] emergency department on 10/09/2020 secondary to paranoia.  She had presented to the behavioral health urgent care center under involuntary commitment, but because the involuntary commitment had to be transferred to [facility].  She was then transferred to our facility.  Objective: Patient is seen and examined.  Patient is a 44 year old female with the above-stated past psychiatric history who is seen in follow-up.  Unfortunately yesterday we had to go to the forced medication protocol because she had refused all medications.  After the forced medication protocol was in place she more than willingly excepted the Abilify injection.  She has apparently been compliant with the rest of her medications.  This morning she still sleeping on the bench in her room versus the bed.  She is more pleasant.  She remains paranoid.  She denied auditory and visual hallucinations, but her paranoia is still significantly present.  She does appear to be slightly more pleasant.  Her vital signs are stable, she is afebrile.  She slept 8.5 hours last night.  No new laboratories.  She denied any suicidal or homicidal ideation.  Principal Problem: <principal problem not specified> Diagnosis: Active Problems:   Schizophrenia (HCC)  Total Time spent with patient: 20 minutes  Past Psychiatric History: See admission H&P  Past Medical History:  Past Medical History:  Diagnosis Date  . Schizophrenia [facility])     Past Surgical History:  Procedure Laterality Date  . INDUCED ABORTION     Family History:  Family History  Problem Relation Age of Onset  . Cancer Father    Family Psychiatric  History: See admission H&P Social History:  Social History   Substance and Sexual Activity  Alcohol Use  Never     Social History   Substance and Sexual Activity  Drug Use Never    Social History   Socioeconomic History  . Marital status: Unknown    Spouse name: Not on file  . Number of children: Not on file  . Years of education: Not on file  . Highest education level: Not on file  Occupational History  . Not on file  Tobacco Use  . Smoking status: Never Smoker  . Smokeless tobacco: Never Used  Vaping Use  . Vaping Use: Former  Substance and Sexual Activity  . Alcohol use: Never  . Drug use: Never  . Sexual activity: Never  Other Topics Concern  . Not on file  Social History Narrative   ** Merged History Encounter **       Social Determinants of Health   Financial Resource Strain:   . Difficulty of Paying Living Expenses: Not on file  Food Insecurity:   . Worried About Programme researcher, broadcasting/film/video in the Last Year: Not on file  . Ran Out of Food in the Last Year: Not on file  Transportation Needs:   . Lack of Transportation (Medical): Not on file  . Lack of Transportation (Non-Medical): Not on file  Physical Activity:   . Days of Exercise per Week: Not on file  . Minutes of Exercise per Session: Not on file  Stress:   . Feeling of Stress : Not on file  Social Connections:   . Frequency of Communication with Friends and Family: Not on file  . Frequency of Social Gatherings  with Friends and Family: Not on file  . Attends Religious Services: Not on file  . Active Member of Clubs or Organizations: Not on file  . Attends Banker Meetings: Not on file  . Marital Status: Not on file   Additional Social History:                         Sleep: Good  Appetite:  Good  Current Medications: Current Facility-Administered Medications  Medication Dose Route Frequency Provider Last Rate Last Admin  . acetaminophen (TYLENOL) tablet 650 mg  650 mg Oral Q6H PRN Antonieta Pert, MD   650 mg at 10/12/20 1006  . alum & mag hydroxide-simeth (MAALOX/MYLANTA)  200-200-20 MG/5ML suspension 30 mL  30 mL Oral Q4H PRN Antonieta Pert, MD      . ARIPiprazole (ABILIFY) tablet 20 mg  20 mg Oral Daily Antonieta Pert, MD   20 mg at 10/12/20 6226  . carbamazepine (TEGRETOL) chewable tablet 100 mg  100 mg Oral TID Antonieta Pert, MD   100 mg at 10/12/20 1300  . hydrOXYzine (ATARAX/VISTARIL) tablet 25 mg  25 mg Oral TID PRN Antonieta Pert, MD      . risperiDONE (RISPERDAL M-TABS) disintegrating tablet 2 mg  2 mg Oral Q8H PRN Antonieta Pert, MD   2 mg at 10/12/20 1048   And  . LORazepam (ATIVAN) tablet 1 mg  1 mg Oral PRN Antonieta Pert, MD   1 mg at 10/12/20 1049   And  . ziprasidone (GEODON) injection 20 mg  20 mg Intramuscular Q6H PRN Antonieta Pert, MD      . magnesium hydroxide (MILK OF MAGNESIA) suspension 30 mL  30 mL Oral Daily PRN Antonieta Pert, MD      . OLANZapine [facility]) injection 5 mg  5 mg Intramuscular BID PRN Antonieta Pert, MD      . risperiDONE (RISPERDAL M-TABS) disintegrating tablet 4 mg  4 mg Oral QHS Antonieta Pert, MD   4 mg at 10/11/20 2109  . traZODone (DESYREL) tablet 50 mg  50 mg Oral QHS PRN Antonieta Pert, MD   50 mg at 10/10/20 2057    Lab Results: No results found for this or any previous visit (from the past 48 hour(s)).  Blood Alcohol level:  Lab Results  Component Value Date   ETH <10 10/09/2020   ETH <10 04/17/2020    Metabolic Disorder Labs: Lab Results  Component Value Date   HGBA1C 6.6 (H) 03/26/2020   MPG 142.72 03/26/2020   No results found for: PROLACTIN Lab Results  Component Value Date   CHOL 173 03/26/2020   TRIG 67 03/26/2020   HDL 47 03/26/2020   CHOLHDL 3.7 03/26/2020   VLDL 13 03/26/2020   LDLCALC 113 (H) 03/26/2020    Physical Findings: AIMS: Facial and Oral Movements Muscles of Facial Expression: None, normal Lips and Perioral Area: None, normal Jaw: None, normal Tongue: None, normal,Extremity Movements Upper (arms, wrists, hands, fingers):  None, normal Lower (legs, knees, ankles, toes): None, normal, Trunk Movements Neck, shoulders, hips: None, normal, Overall Severity Severity of abnormal movements (highest score from questions above): None, normal Incapacitation due to abnormal movements: None, normal Patient's awareness of abnormal movements (rate only patient's report): No Awareness, Dental Status Current problems with teeth and/or dentures?: No Does patient usually wear dentures?: No  CIWA:    COWS:     Musculoskeletal: Strength &  Muscle Tone: within normal limits Gait & Station: normal Patient leans: N/A  Psychiatric Specialty Exam: Physical Exam Vitals and nursing note reviewed.  HENT:     Head: Normocephalic and atraumatic.  Pulmonary:     Effort: Pulmonary effort is normal.  Neurological:     General: No focal deficit present.     Mental Status: She is alert.     Review of Systems  Blood pressure 122/62, pulse 64, temperature 99.3 F (37.4 C), temperature source Oral, resp. rate 18, height 5\' 1"  (1.549 m), weight 71.7 kg, SpO2 100 %.Body mass index is 29.85 kg/m.  General Appearance: Disheveled  Eye Contact:  Fair  Speech:  Normal Rate  Volume:  Decreased  Mood:  Dysphoric  Affect:  Flat  Thought Process:  Coherent and Descriptions of Associations: Loose  Orientation:  Full (Time, Place, and Person)  Thought Content:  Delusions and Paranoid Ideation  Suicidal Thoughts:  No  Homicidal Thoughts:  No  Memory:  Immediate;   Fair Recent;   Fair Remote;   Fair  Judgement:  Impaired  Insight:  Fair  Psychomotor Activity:  Normal  Concentration:  Concentration: Fair and Attention Span: Fair  Recall:  of Knowledge:  Fair  Language:  Fair  Akathisia:  Negative  Handed:  Right  AIMS (if indicated):     Assets:  Desire for Improvement Resilience  ADL's:  Impaired  Cognition:  WNL  Sleep:  Number of Hours: 8.5     Treatment Plan Summary: Daily contact with patient to assess and  evaluate symptoms and progress in treatment, Medication management and Plan : Patient is seen and examined.  Patient is a 44 year old female with the above-stated past psychiatric history who is seen in follow-up.   Diagnosis: 1.  Schizophrenia.  Pertinent findings on examination today: 1.  Forced medication protocol was put in place, and she received the long-acting Abilify injection yesterday. 2.  Initially denied auditory hallucinations but did relent and talked about the voices "that I hear". 3.  She remains paranoid.  Plan: 1.  Patient received the long-acting Abilify injection 400 mg IM x1 on 10/11/2020. 2.  Continue Abilify 20 mg p.o. daily for psychosis. 3.  Continue Tegretol 100 mg p.o. 3 times daily for mood stability and decreased irritability. 4.  Continue Risperdal 4 mg p.o. nightly for psychosis. 5.  Continue hydroxyzine 25 mg p.o. 3 times daily as needed anxiety. 6.  Continue trazodone 50 mg p.o. nightly as needed insomnia. 7.    Disposition planning-in progress.  10/13/2020, MD 10/12/2020, 1:33 PM

## 2020-10-12 NOTE — [facility] Group Notes (Signed)
Adult Psychoeducational Group Not Date:  10/12/2020 Time:  0900-1045 Group Topic/Focus: PROGRESSIVE RELAXATION. A group where deep breathing is taught and tensing and relaxation muscle groups is used. Imagery is used as well.  Pts are asked to imagine 3 pillars that hold them up when they are not able to hold themselves up.  Participation Level:  Pt sat in the chair in the fetal position  Participation Quality:  Did answer some of the questions that were posed to her  Affect:  Flat/ poor eye contact  Cognitive:  Oriented  Insight: lacking  Engagement in Group:  Listening perhaps   Modes of Intervention:  Activity, Discussion, Education, and Support  Additional Comments:  Pt sat in a fetal position in the chair with her back to the room. Did not make eye contact.  Dione Housekeeper 10/12/2020`

## 2020-10-12 NOTE — Progress Notes (Addendum)
Patient was in the dayroom more tonight watching tv and played cards with peers. Pleasant and cooperative   10/12/20 2040  Psych Admission Type (Psych Patients Only)  Admission Status Involuntary  Psychosocial Assessment  Patient Complaints None  Eye Contact Brief  Facial Expression Flat  Affect Appropriate to circumstance;Blunted  Speech Soft;Slow  Interaction Childlike  Motor Activity Slow  Appearance/Hygiene Disheveled  Behavior Characteristics Cooperative;Calm  Mood Preoccupied;Pleasant  Thought Process  Coherency Concrete thinking;Circumstantial  Content Blaming others;Preoccupation;Paranoia  Delusions Paranoid  Perception Derealization  Hallucination None reported or observed  Judgment Impaired  Confusion None  Danger to Self  Current suicidal ideation? Denies  Danger to Others  Danger to Others None reported or observed   card game with peers. She was pleasant and cooperative.

## 2020-10-13 DIAGNOSIS — F200 Paranoid schizophrenia: Secondary | ICD-10-CM | POA: Diagnosis not present

## 2020-10-13 NOTE — [facility] Group Notes (Signed)
The focus of this group is to help patients establish daily goals to achieve during treatment and discuss how the patient can incorporate goal setting into their daily lives to aide in recovery.  Pt did not attend group 

## 2020-10-13 NOTE — Tx Team (Signed)
Interdisciplinary Treatment and Diagnostic Plan Update  10/13/2020 Time of Session: 11:00am [name] MRN: [id number]  Principal Diagnosis: <principal problem not specified>  Secondary Diagnoses: Active Problems:   Schizophrenia (HCC)   Current Medications:  Current Facility-Administered Medications  Medication Dose Route Frequency Provider Last Rate Last Admin  . acetaminophen (TYLENOL) tablet 650 mg  650 mg Oral Q6H PRN Antonieta Pert, MD   650 mg at 10/12/20 1006  . alum & mag hydroxide-simeth (MAALOX/MYLANTA) 200-200-20 MG/5ML suspension 30 mL  30 mL Oral Q4H PRN Antonieta Pert, MD      . ARIPiprazole (ABILIFY) tablet 20 mg  20 mg Oral Daily Antonieta Pert, MD   20 mg at 10/13/20 0733  . carbamazepine (TEGRETOL) chewable tablet 100 mg  100 mg Oral TID Antonieta Pert, MD   100 mg at 10/13/20 1243  . hydrOXYzine (ATARAX/VISTARIL) tablet 25 mg  25 mg Oral TID PRN Antonieta Pert, MD      . risperiDONE (RISPERDAL M-TABS) disintegrating tablet 2 mg  2 mg Oral Q8H PRN Antonieta Pert, MD   2 mg at 10/12/20 1048   And  . LORazepam (ATIVAN) tablet 1 mg  1 mg Oral PRN Antonieta Pert, MD   1 mg at 10/12/20 1049   And  . ziprasidone (GEODON) injection 20 mg  20 mg Intramuscular Q6H PRN Antonieta Pert, MD      . magnesium hydroxide (MILK OF MAGNESIA) suspension 30 mL  30 mL Oral Daily PRN Antonieta Pert, MD      . OLANZapine [facility]) injection 5 mg  5 mg Intramuscular BID PRN Antonieta Pert, MD      . risperiDONE (RISPERDAL M-TABS) disintegrating tablet 4 mg  4 mg Oral QHS Antonieta Pert, MD   4 mg at 10/12/20 2136  . traZODone (DESYREL) tablet 50 mg  50 mg Oral QHS PRN Antonieta Pert, MD   50 mg at 10/10/20 2057   PTA Medications: Medications Prior to Admission  Medication Sig Dispense Refill Last Dose  . ARIPiprazole ER (ABILIFY MAINTENA) 400 MG SRER injection Inject 2 mLs (400 mg total) into the muscle every 28 (twenty-eight) days. Due  approx 6/13 (Patient not taking: Reported on 10/10/2020) 1 each 11     Patient Stressors: Medication change or noncompliance Other: Chronic mental illness  Patient Strengths: Capable of independent living General fund of knowledge Physical Health  Treatment Modalities: Medication Management, Group therapy, Case management,  1 to 1 session with clinician, Psychoeducation, Recreational therapy.   Physician Treatment Plan for Primary Diagnosis: <principal problem not specified> Long Term Goal(s): Improvement in symptoms so as ready for discharge Improvement in symptoms so as ready for discharge   Short Term Goals: Ability to identify changes in lifestyle to reduce recurrence of condition will improve Ability to verbalize feelings will improve Ability to demonstrate self-control will improve Ability to identify and develop effective coping behaviors will improve Ability to maintain clinical measurements within normal limits will improve Compliance with prescribed medications will improve Ability to identify changes in lifestyle to reduce recurrence of condition will improve Ability to verbalize feelings will improve Ability to demonstrate self-control will improve Ability to identify and develop effective coping behaviors will improve Ability to maintain clinical measurements within normal limits will improve Compliance with prescribed medications will improve  Medication Management: Evaluate patient's response, side effects, and tolerance of medication regimen.  Therapeutic Interventions: 1 to 1 sessions, Unit Group sessions and Medication administration.  Evaluation of Outcomes: Progressing  Physician Treatment Plan for Secondary Diagnosis: Active Problems:   Schizophrenia (HCC)  Long Term Goal(s): Improvement in symptoms so as ready for discharge Improvement in symptoms so as ready for discharge   Short Term Goals: Ability to identify changes in lifestyle to reduce  recurrence of condition will improve Ability to verbalize feelings will improve Ability to demonstrate self-control will improve Ability to identify and develop effective coping behaviors will improve Ability to maintain clinical measurements within normal limits will improve Compliance with prescribed medications will improve Ability to identify changes in lifestyle to reduce recurrence of condition will improve Ability to verbalize feelings will improve Ability to demonstrate self-control will improve Ability to identify and develop effective coping behaviors will improve Ability to maintain clinical measurements within normal limits will improve Compliance with prescribed medications will improve     Medication Management: Evaluate patient's response, side effects, and tolerance of medication regimen.  Therapeutic Interventions: 1 to 1 sessions, Unit Group sessions and Medication administration.  Evaluation of Outcomes: Progressing   RN Treatment Plan for Primary Diagnosis: <principal problem not specified> Long Term Goal(s): Knowledge of disease and therapeutic regimen to maintain health will improve  Short Term Goals: Ability to remain free from injury will improve, Ability to verbalize frustration and anger appropriately will improve, Ability to verbalize feelings will improve, Ability to identify and develop effective coping behaviors will improve and Compliance with prescribed medications will improve  Medication Management: RN will administer medications as ordered by provider, will assess and evaluate patient's response and provide education to patient for prescribed medication. RN will report any adverse and/or side effects to prescribing provider.  Therapeutic Interventions: 1 on 1 counseling sessions, Psychoeducation, Medication administration, Evaluate responses to treatment, Monitor vital signs and CBGs as ordered, Perform/monitor CIWA, COWS, AIMS and Fall Risk screenings as  ordered, Perform wound care treatments as ordered.  Evaluation of Outcomes: Progressing   LCSW Treatment Plan for Primary Diagnosis: <principal problem not specified> Long Term Goal(s): Safe transition to appropriate next level of care at discharge, Engage patient in therapeutic group addressing interpersonal concerns.  Short Term Goals: Engage patient in aftercare planning with referrals and resources, Increase social support, Increase ability to appropriately verbalize feelings, Identify triggers associated with mental health/substance abuse issues and Increase skills for wellness and recovery  Therapeutic Interventions: Assess for all discharge needs, 1 to 1 time with Social worker, Explore available resources and support systems, Assess for adequacy in community support network, Educate family and significant other(s) on suicide prevention, Complete Psychosocial Assessment, Interpersonal group therapy.  Evaluation of Outcomes: Progressing   Progress in Treatment: Attending groups: Yes. Participating in groups: No. Taking medication as prescribed: Yes. Toleration medication: Yes. Family/Significant other contact made: No, will contact:  patient declined consents Patient understands diagnosis: No. Discussing patient identified problems/goals with staff: Yes. Medical problems stabilized or resolved: Yes. Denies suicidal/homicidal ideation: Yes. Issues/concerns per patient self-inventory: No.   New problem(s) identified: No, Describe:  none  New Short Term/Long Term Goal(s): medication stabilization, elimination of SI thoughts, development of comprehensive mental wellness plan.   Patient Goals: "To get out of here"  Discharge Plan or Barriers: Patient is to return home to her apartment. Patient has declined all follow up at this time. CSW will continue to explore after care with this patient.  Reason for Continuation of Hospitalization: Delusions  Hallucinations Medication  stabilization  Estimated Length of Stay: 3-5 days  Attendees: Patient: Stephanie [name] 10/13/2020  Physician:  10/13/2020  Nursing:  10/13/2020  RN Care Manager: 10/13/2020   Social Worker: Ruthann Cancer, LCSW 10/13/2020   Recreational Therapist:  10/13/2020   Other:  10/13/2020  Other:  10/13/2020   Other: 10/13/2020     Scribe for Treatment Team: Otelia Santee, LCSW 10/13/2020 2:41 PM

## 2020-10-13 NOTE — Progress Notes (Signed)
[facility] MD Progress Note  10/13/2020 2:20 PM Stephanie [name]  MRN:  [id number] Subjective:  Patient is a 44 year old female with a past psychiatric history significant for schizophrenia who presented to the [facility] emergency department on 10/09/2020 secondary to paranoia.  She had presented to the behavioral health urgent care center under involuntary commitment, but because the involuntary commitment had to be transferred to [facility].  She was then transferred to our facility.  Objective: Patient is seen and examined.  Patient is a 44 year old female with the above-stated past psychiatric history who is seen in follow-up.  Pt interviewed in her room. Discussed abilify; pt unclear why she needs PO abilify since she had LAI- discussed need for 2 week PO overlap, verbalized understanding. She denies SI/HI/VH. She reports that the last time she experienced AH was yesterday; described as "repeating a nickname I used to go by"; denied CAH. Pt is calm and cooperative. Per documentation, appears that paranoia has somewhat decreased. She inquires about discharged, informed that she will not be discharged today but likely sometime later this week; verbalized understanding, states that she plans to return to her apartment.   Principal Problem: <principal problem not specified> Diagnosis: Active Problems:   Schizophrenia (HCC)  Total Time spent with patient: 20 minutes  Past Psychiatric History: See admission H&P  Past Medical History:  Past Medical History:  Diagnosis Date  . Schizophrenia [facility])     Past Surgical History:  Procedure Laterality Date  . INDUCED ABORTION     Family History:  Family History  Problem Relation Age of Onset  . Cancer Father    Family Psychiatric  History: See admission H&P Social History:  Social History   Substance and Sexual Activity  Alcohol Use Never     Social History   Substance and Sexual Activity  Drug Use Never    Social History    Socioeconomic History  . Marital status: Unknown    Spouse name: Not on file  . Number of children: Not on file  . Years of education: Not on file  . Highest education level: Not on file  Occupational History  . Not on file  Tobacco Use  . Smoking status: Never Smoker  . Smokeless tobacco: Never Used  Vaping Use  . Vaping Use: Former  Substance and Sexual Activity  . Alcohol use: Never  . Drug use: Never  . Sexual activity: Never  Other Topics Concern  . Not on file  Social History Narrative   ** Merged History Encounter **       Social Determinants of Health   Financial Resource Strain:   . Difficulty of Paying Living Expenses: Not on file  Food Insecurity:   . Worried About Programme researcher, broadcasting/film/video in the Last Year: Not on file  . Ran Out of Food in the Last Year: Not on file  Transportation Needs:   . Lack of Transportation (Medical): Not on file  . Lack of Transportation (Non-Medical): Not on file  Physical Activity:   . Days of Exercise per Week: Not on file  . Minutes of Exercise per Session: Not on file  Stress:   . Feeling of Stress : Not on file  Social Connections:   . Frequency of Communication with Friends and Family: Not on file  . Frequency of Social Gatherings with Friends and Family: Not on file  . Attends Religious Services: Not on file  . Active Member of Clubs or Organizations: Not on  file  . Attends Banker Meetings: Not on file  . Marital Status: Not on file   Additional Social History:                         Sleep: Good  Appetite:  Good  Current Medications: Current Facility-Administered Medications  Medication Dose Route Frequency Provider Last Rate Last Admin  . acetaminophen (TYLENOL) tablet 650 mg  650 mg Oral Q6H PRN Antonieta Pert, MD   650 mg at 10/12/20 1006  . alum & mag hydroxide-simeth (MAALOX/MYLANTA) 200-200-20 MG/5ML suspension 30 mL  30 mL Oral Q4H PRN Antonieta Pert, MD      . ARIPiprazole  (ABILIFY) tablet 20 mg  20 mg Oral Daily Antonieta Pert, MD   20 mg at 10/13/20 0733  . carbamazepine (TEGRETOL) chewable tablet 100 mg  100 mg Oral TID Antonieta Pert, MD   100 mg at 10/13/20 1243  . hydrOXYzine (ATARAX/VISTARIL) tablet 25 mg  25 mg Oral TID PRN Antonieta Pert, MD      . risperiDONE (RISPERDAL M-TABS) disintegrating tablet 2 mg  2 mg Oral Q8H PRN Antonieta Pert, MD   2 mg at 10/12/20 1048   And  . LORazepam (ATIVAN) tablet 1 mg  1 mg Oral PRN Antonieta Pert, MD   1 mg at 10/12/20 1049   And  . ziprasidone (GEODON) injection 20 mg  20 mg Intramuscular Q6H PRN Antonieta Pert, MD      . magnesium hydroxide (MILK OF MAGNESIA) suspension 30 mL  30 mL Oral Daily PRN Antonieta Pert, MD      . OLANZapine [facility]) injection 5 mg  5 mg Intramuscular BID PRN Antonieta Pert, MD      . risperiDONE (RISPERDAL M-TABS) disintegrating tablet 4 mg  4 mg Oral QHS Antonieta Pert, MD   4 mg at 10/12/20 2136  . traZODone (DESYREL) tablet 50 mg  50 mg Oral QHS PRN Antonieta Pert, MD   50 mg at 10/10/20 2057    Lab Results: No results found for this or any previous visit (from the past 48 hour(s)).  Blood Alcohol level:  Lab Results  Component Value Date   ETH <10 10/09/2020   ETH <10 04/17/2020    Metabolic Disorder Labs: Lab Results  Component Value Date   HGBA1C 6.6 (H) 03/26/2020   MPG 142.72 03/26/2020   No results found for: PROLACTIN Lab Results  Component Value Date   CHOL 173 03/26/2020   TRIG 67 03/26/2020   HDL 47 03/26/2020   CHOLHDL 3.7 03/26/2020   VLDL 13 03/26/2020   LDLCALC 113 (H) 03/26/2020    Physical Findings: AIMS: Facial and Oral Movements Muscles of Facial Expression: None, normal Lips and Perioral Area: None, normal Jaw: None, normal Tongue: None, normal,Extremity Movements Upper (arms, wrists, hands, fingers): None, normal Lower (legs, knees, ankles, toes): None, normal, Trunk Movements Neck, shoulders, hips:  None, normal, Overall Severity Severity of abnormal movements (highest score from questions above): None, normal Incapacitation due to abnormal movements: None, normal Patient's awareness of abnormal movements (rate only patient's report): No Awareness, Dental Status Current problems with teeth and/or dentures?: No Does patient usually wear dentures?: No  CIWA:    COWS:     Musculoskeletal: Strength & Muscle Tone: within normal limits Gait & Station: normal Patient leans: N/A  Psychiatric Specialty Exam: Physical Exam Vitals and nursing note reviewed.  HENT:  Head: Normocephalic and atraumatic.  Pulmonary:     Effort: Pulmonary effort is normal.  Neurological:     General: No focal deficit present.     Mental Status: She is alert.     Review of Systems   Blood pressure 133/71, pulse 80, temperature 98.1 F (36.7 C), temperature source Oral, resp. rate 18, height 5\' 1"  (1.549 m), weight 71.7 kg, SpO2 100 %.Body mass index is 29.85 kg/m.  General Appearance: Disheveled  Eye Contact:  Fair  Speech:  Normal Rate  Volume:  Decreased  Mood:  Dysphoric  Affect:  Flat  Thought Process:  Coherent and Descriptions of Associations: Loose  Orientation:  Full (Time, Place, and Person)  Thought Content:  Paranoid Ideation  Suicidal Thoughts:  No  Homicidal Thoughts:  No  Memory:  Immediate;   Fair Recent;   Fair Remote;   Fair  Judgement:  Impaired  Insight:  Fair  Psychomotor Activity:  Normal  Concentration:  Concentration: Fair and Attention Span: Fair  Recall:  of Knowledge:  Fair  Language:  Fair  Akathisia:  Negative  Handed:  Right  AIMS (if indicated):     Assets:  Desire for Improvement Resilience  ADL's:  Impaired  Cognition:  WNL  Sleep:  Number of Hours: 6.75     Treatment Plan Summary: Daily contact with patient to assess and evaluate symptoms and progress in treatment, Medication management and Plan : Patient is seen and examined.  Patient  is a 44 year old female with the above-stated past psychiatric history who is seen in follow-up.   Diagnosis: 1.  Schizophrenia.  Pertinent findings on examination today: 1.  Forced medication protocol was put in place, and she received the long-acting Abilify injection yesterday. 2.  Initially denied auditory hallucinations but did relent and talked about the voices "that I hear". 3.  She remains paranoid. 4. Will need tegretol level ; steady states reached in 2-5 days, will need to be trough level prior to a dose  Plan: 1.  Patient received the long-acting Abilify injection 400 mg IM x1 on 10/11/2020. 2.  Continue Abilify 20 mg p.o. daily for psychosis. 3.  Continue Tegretol 100 mg p.o. 3 times daily for mood stability and decreased irritability. 4.  Continue Risperdal 4 mg p.o. nightly for psychosis. 5.  Continue hydroxyzine 25 mg p.o. 3 times daily as needed anxiety. 6.  Continue trazodone 50 mg p.o. nightly as needed insomnia. 7.    Disposition planning-in progress.  10/13/2020, MD 10/13/2020, 2:20 PM
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
## 2020-10-13 NOTE — Progress Notes (Signed)
D:  Patient denied SI and HI, contracts for safety.  Denied A/V hallucinations.  Denied pain. A:  Medications administered per MD orders.  Emotional support and encouragement given patient. R:  Safety maintained with 15 minute checks.  

## 2020-10-13 NOTE — Progress Notes (Signed)
   10/13/20 2100  Psych Admission Type (Psych Patients Only)  Admission Status Involuntary  Psychosocial Assessment  Patient Complaints None  Eye Contact Brief  Facial Expression Flat  Affect Appropriate to circumstance;Blunted  Speech Soft;Slow  Interaction Childlike  Motor Activity Slow  Appearance/Hygiene Disheveled  Behavior Characteristics Cooperative  Mood Depressed  Thought Process  Coherency Concrete thinking;Circumstantial  Content WDL  Delusions Paranoid  Perception UTA  Hallucination None reported or observed  Judgment Impaired  Confusion None  Danger to Self  Current suicidal ideation? Denies  Danger to Others  Danger to Others None reported or observed   Pt seen sitting in dayroom but not interacting. Pt denies SI, HI, AVH and pain. Pt takes meds without incident.
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
## 2020-10-13 NOTE — BHH Group Notes (Signed)
  LCSW Group Therapy Note   10/13/2020  Type of Therapy and Topic: Group Therapy: Feelings Around Returning Home & Establishing a Supportive Framework and Supporting Oneself When Supports Not Available  Participation Level: None   Description of Group: Patients first processed thoughts and feelings about upcoming discharge. These included fears of upcoming changes, lack of change, new living environments, judgements and expectations from others and overall stigma of mental health issues. The group then discussed the definition of a supportive framework, what that looks and feels like, and how do to discern it from an unhealthy non-supportive network. The group identified different types of supports as well as what to do when your family/friends are less than helpful or unavailable    Therapeutic Goals  1.  Patient will identify one healthy supportive network that they can use at discharge.  2.  Patient will identify one factor of a supportive framework and how to tell it from an unhealthy network.  3.  Patient able to identify one coping skill to use when they do not have positive supports from others.  4.  Patient will demonstrate ability to communicate their needs through discussion and/or role plays.  Summary of Patient Progress: Patient attended group, however offered no participation.      Therapeutic Modalities Cognitive Behavioral Therapy Motivational Interviewing  Ruthann Cancer MSW, LCSW Clincal Social Worker  [facility]

## 2020-10-14 DIAGNOSIS — F200 Paranoid schizophrenia: Secondary | ICD-10-CM | POA: Diagnosis not present

## 2020-10-14 NOTE — Progress Notes (Signed)
Recreation Therapy Notes  Date: 11.23.21 Time: 1000 Location: 500 Hall Dayroom   Group Topic: Support System   Goal Area(s) Addresses:  Patient will identify members of their support system. Patient will verbalize benefit of healthy supports. Patient will verbalize positive effect of healthy supports post d/c.  Patient will identify negative relationships in support system and discuss alternatives.    Behavioral Response: Engaged   Intervention: Scientist, clinical (histocompatibility and immunogenetics), colored pencils   Activity: Furniture conservator/restorer.  LRT led art activity in getting patients to identify members of their support system good and bad outside of the hospital.  Patients were to then map out proximity of the people in their support system in relation to themselves.   Education: Heritage manager, Special educational needs teacher, Discharge Planning   Education Outcome: Acknowledges understanding/In group clarification offered/Needs additional education.   Clinical Observations/Feedback: Pt was attentive and active in group.  Pt was able to identify the people in her support system.  One her map, pt used a number to coorespond with each person's name.  After completing the map, pt expressed her college friends were her closer supports and her family was farther away.  When asked if there would ever be changes made to the map, pt expressed only if she had a "falling out" with her friends.     Meryle Ready, LRT/CTRS   Caroll Rancher A 10/14/2020 11:42 AM

## 2020-10-14 NOTE — Progress Notes (Signed)
Adult Psychoeducational Group Note  Date:  10/14/2020 Time:  10:51 PM  Group Topic/Focus:  Wrap-Up Group:   The focus of this group is to help patients review their daily goal of treatment and discuss progress on daily workbooks.  Participation Level:  Did Not Attend  Participation Quality:  Did Not Attend  Affect:  Did Not Attend  Cognitive:  Did Not Attend  Insight: None  Engagement in Group:  Did Not Attend  Modes of Intervention:  Did Not Attend  Additional Comments:  Pt did not attend evening wrap up group tonight.  Felipa Furnace 10/14/2020, 10:51 PM

## 2020-10-14 NOTE — Progress Notes (Signed)
   10/14/20 1400  Psych Admission Type (Psych Patients Only)  Admission Status Involuntary  Psychosocial Assessment  Patient Complaints Worrying  Eye Contact Fair  Facial Expression Flat  Affect Appropriate to circumstance  Speech Soft  Interaction Cautious  Motor Activity Other (Comment) (WDL)  Appearance/Hygiene Unremarkable  Behavior Characteristics Cooperative  Mood Depressed  Thought Process  Coherency Concrete thinking  Content WDL  Delusions None reported or observed  Perception WDL  Hallucination None reported or observed  Judgment Poor  Confusion None  Danger to Self  Current suicidal ideation? Denies  Danger to Others  Danger to Others None reported or observed  Pt reports she is leaving tomorrow. When asked if there was anything keeping her from continuing her medication, she said she could not pay for them. Pt reports no insurance or job at this time. She said she had been unable to find work. Pt reports someone helps with her rent and electricity including a local church. Discussed resources at [facility] in [place]. Will report to SW. Pt denies si and hi. Safety maintained on the unit.

## 2020-10-14 NOTE — Progress Notes (Signed)
Adult Psychoeducational Group Note  Date:  10/14/2020 Time:  3:55 AM  Group Topic/Focus:  Wrap-Up Group:   The focus of this group is to help patients review their daily goal of treatment and discuss progress on daily workbooks.  Participation Level:  Active  Participation Quality:  Appropriate  Affect:  Appropriate  Cognitive:  Appropriate  Insight: Appropriate  Engagement in Group:  Developing/Improving  Modes of Intervention:  Discussion  Additional Comments:  Pt stated her goal for today was to focus on her treatment plan and attend all groups held. Pt stated she accomplished her goal today. Pt stated her relationship with her family needs to improve.  Pt stated she felt better about herself today. Pt stated she took all medication from her providers today. Pt stated she was able to talk with her doctor and social worker about her care today. Pt rated her overall day a 10. Pt stated her appetite was pretty good today and she received all meals today. Pt stated her sleep last night was pretty good. Pt stated she was in no physical pain today.  Pt deny auditory or visual hallucinations. Pt denies thoughts of harming herself or others. Pt stated she would alert staff if anything changes.  Felipa Furnace 10/14/2020, 3:55 AM

## 2020-10-14 NOTE — Progress Notes (Addendum)
[facility] MD Progress Note  10/14/2020 1:43 PM Stephanie [name]  MRN:  [id number] Subjective:  Patient is a 44 year old female with a past psychiatric history significant for schizophrenia who presented to the [facility] emergency department on 10/09/2020 secondary to paranoia.  She had presented to the behavioral health urgent care center under involuntary commitment, but because the involuntary commitment had to be transferred to [facility].  She was then transferred to our facility.  Objective: Patient is seen and examined.  Patient is a 44 year old female with the above-stated past psychiatric history who is seen in follow-up.  Pt found in the dayroom and agreeable to go to her room for interview for privacy reasons. Patient states that she feels she is doing well and describes her mood as  "good". She reports sleeping well and having a good appetite. She denies SI/HI/AVH. She denies paranoia. Discuss plan to draw tegretol level tonight- pt amenable and verbalizes understanding.   Principal Problem: <principal problem not specified> Diagnosis: Active Problems:   Schizophrenia (HCC)  Total Time spent with patient: 20 minutes  Past Psychiatric History: See admission H&P  Past Medical History:  Past Medical History:  Diagnosis Date  . Schizophrenia [facility])     Past Surgical History:  Procedure Laterality Date  . INDUCED ABORTION     Family History:  Family History  Problem Relation Age of Onset  . Cancer Father    Family Psychiatric  History: See admission H&P Social History:  Social History   Substance and Sexual Activity  Alcohol Use Never     Social History   Substance and Sexual Activity  Drug Use Never    Social History   Socioeconomic History  . Marital status: Unknown    Spouse name: Not on file  . Number of children: Not on file  . Years of education: Not on file  . Highest education level: Not on file  Occupational History  . Not on file   Tobacco Use  . Smoking status: Never Smoker  . Smokeless tobacco: Never Used  Vaping Use  . Vaping Use: Former  Substance and Sexual Activity  . Alcohol use: Never  . Drug use: Never  . Sexual activity: Never  Other Topics Concern  . Not on file  Social History Narrative   ** Merged History Encounter **       Social Determinants of Health   Financial Resource Strain:   . Difficulty of Paying Living Expenses: Not on file  Food Insecurity:   . Worried About Programme researcher, broadcasting/film/video in the Last Year: Not on file  . Ran Out of Food in the Last Year: Not on file  Transportation Needs:   . Lack of Transportation (Medical): Not on file  . Lack of Transportation (Non-Medical): Not on file  Physical Activity:   . Days of Exercise per Week: Not on file  . Minutes of Exercise per Session: Not on file  Stress:   . Feeling of Stress : Not on file  Social Connections:   . Frequency of Communication with Friends and Family: Not on file  . Frequency of Social Gatherings with Friends and Family: Not on file  . Attends Religious Services: Not on file  . Active Member of Clubs or Organizations: Not on file  . Attends Banker Meetings: Not on file  . Marital Status: Not on file   Additional Social History:  Sleep: Good  Appetite:  Good  Current Medications: Current Facility-Administered Medications  Medication Dose Route Frequency Provider Last Rate Last Admin  . acetaminophen (TYLENOL) tablet 650 mg  650 mg Oral Q6H PRN Antonieta Pert, MD   650 mg at 10/12/20 1006  . alum & mag hydroxide-simeth (MAALOX/MYLANTA) 200-200-20 MG/5ML suspension 30 mL  30 mL Oral Q4H PRN Antonieta Pert, MD      . ARIPiprazole (ABILIFY) tablet 20 mg  20 mg Oral Daily Antonieta Pert, MD   20 mg at 10/14/20 0744  . carbamazepine (TEGRETOL) chewable tablet 100 mg  100 mg Oral TID Antonieta Pert, MD   100 mg at 10/14/20 1139  . hydrOXYzine  (ATARAX/VISTARIL) tablet 25 mg  25 mg Oral TID PRN Antonieta Pert, MD      . risperiDONE (RISPERDAL M-TABS) disintegrating tablet 2 mg  2 mg Oral Q8H PRN Antonieta Pert, MD   2 mg at 10/12/20 1048   And  . LORazepam (ATIVAN) tablet 1 mg  1 mg Oral PRN Antonieta Pert, MD   1 mg at 10/12/20 1049   And  . ziprasidone (GEODON) injection 20 mg  20 mg Intramuscular Q6H PRN Antonieta Pert, MD      . magnesium hydroxide (MILK OF MAGNESIA) suspension 30 mL  30 mL Oral Daily PRN Antonieta Pert, MD      . OLANZapine [facility]) injection 5 mg  5 mg Intramuscular BID PRN Antonieta Pert, MD      . risperiDONE (RISPERDAL M-TABS) disintegrating tablet 4 mg  4 mg Oral QHS Antonieta Pert, MD   4 mg at 10/13/20 2038  . traZODone (DESYREL) tablet 50 mg  50 mg Oral QHS PRN Antonieta Pert, MD   50 mg at 10/10/20 2057    Lab Results: No results found for this or any previous visit (from the past 48 hour(s)).  Blood Alcohol level:  Lab Results  Component Value Date   ETH <10 10/09/2020   ETH <10 04/17/2020    Metabolic Disorder Labs: Lab Results  Component Value Date   HGBA1C 6.6 (H) 03/26/2020   MPG 142.72 03/26/2020   No results found for: PROLACTIN Lab Results  Component Value Date   CHOL 173 03/26/2020   TRIG 67 03/26/2020   HDL 47 03/26/2020   CHOLHDL 3.7 03/26/2020   VLDL 13 03/26/2020   LDLCALC 113 (H) 03/26/2020    Physical Findings: AIMS: Facial and Oral Movements Muscles of Facial Expression: None, normal Lips and Perioral Area: None, normal Jaw: None, normal Tongue: None, normal,Extremity Movements Upper (arms, wrists, hands, fingers): None, normal Lower (legs, knees, ankles, toes): None, normal, Trunk Movements Neck, shoulders, hips: None, normal, Overall Severity Severity of abnormal movements (highest score from questions above): None, normal Incapacitation due to abnormal movements: None, normal Patient's awareness of abnormal movements (rate  only patient's report): No Awareness, Dental Status Current problems with teeth and/or dentures?: No Does patient usually wear dentures?: No  CIWA:    COWS:     Musculoskeletal: Strength & Muscle Tone: within normal limits Gait & Station: normal Patient leans: N/A  Psychiatric Specialty Exam: Physical Exam Vitals and nursing note reviewed.  HENT:     Head: Normocephalic and atraumatic.  Pulmonary:     Effort: Pulmonary effort is normal.  Neurological:     General: No focal deficit present.     Mental Status: She is alert.     Review of Systems  Constitutional: Negative for activity change and appetite change.  Respiratory: Negative for chest tightness.   Cardiovascular: Negative for chest pain.  Gastrointestinal: Negative for abdominal pain, diarrhea and nausea.  Musculoskeletal: Negative for arthralgias and back pain.  Neurological: Negative for light-headedness.    Blood pressure 133/71, pulse 80, temperature 98.1 F (36.7 C), temperature source Oral, resp. rate 18, height 5\' 1"  (1.549 m), weight 71.7 kg, SpO2 100 %.Body mass index is 29.85 kg/m.  General Appearance: Casual  Eye Contact:  Fair  Speech:  Normal Rate  Volume:  Decreased  Mood:  "good"  Affect:  Appropriate and Constricted  Thought Process:  Coherent and Descriptions of Associations: Loose  Orientation:  Full (Time, Place, and Person)  Thought Content:  WDL  Suicidal Thoughts:  No  Homicidal Thoughts:  No  Memory:  Immediate;   Fair Recent;   Fair Remote;   Fair  Judgement:  Impaired  Insight:  Fair  Psychomotor Activity:  Normal  Concentration:  Concentration: Good and Attention Span: Good  Recall:  Good  Fund of Knowledge:  Good  Language:  Good  Akathisia:  Negative  Handed:  Right  AIMS (if indicated):     Assets:  Desire for Improvement Resilience  ADL's:  Impaired  Cognition:  WNL  Sleep:  Number of Hours: 4     Treatment Plan Summary: Daily contact with patient to assess and  evaluate symptoms and progress in treatment, Medication management and Plan : Patient is seen and examined.  Patient is a 44 year old female with the above-stated past psychiatric history who is seen in follow-up.   Diagnosis: 1.  Schizophrenia.  Pertinent findings on examination today: 1.  Forced medication protocol was put in place, and she received the long-acting Abilify injection yesterday. 2.  Initially denied auditory hallucinations but did relent and talked about the voices "that I hear". 3.  She remains paranoid. 4. Will need tegretol level ; steady states reached in 2-5 days, will need to be trough level prior to a dose- ordered for 11/23 prior to dose at 1700  Plan: 1.  Patient received the long-acting Abilify injection 400 mg IM x1 on 10/11/2020. 2.  Continue Abilify 20 mg p.o. daily for psychosis. 3.  Continue Tegretol 100 mg p.o. 3 times daily for mood stability and decreased irritability. Tegretol level today @ 1700 4.  Continue Risperdal 4 mg p.o. nightly for psychosis. 5.  Continue hydroxyzine 25 mg p.o. 3 times daily as needed anxiety. 6.  Continue trazodone 50 mg p.o. nightly as needed insomnia. 7.    Disposition planning-in progress.  10/13/2020, MD 10/14/2020, 1:43 PM

## 2020-10-15 DIAGNOSIS — F200 Paranoid schizophrenia: Secondary | ICD-10-CM | POA: Diagnosis not present

## 2020-10-15 LAB — LIPID PANEL
Cholesterol: 154 mg/dL (ref 0–200)
HDL: 54 mg/dL (ref >40)
LDL Cholesterol: 58 mg/dL (ref 0–99)
Total CHOL/HDL Ratio: 2.9 RATIO
Triglycerides: 212 mg/dL — ABNORMAL HIGH (ref <150)
VLDL: 42 mg/dL — ABNORMAL HIGH (ref 0–40)

## 2020-10-15 LAB — CARBAMAZEPINE LEVEL, TOTAL: Carbamazepine Lvl: 9.2 ug/mL (ref 4.0–12.0)

## 2020-10-15 MED ORDER — HYDROXYZINE HCL 25 MG PO TABS: 25.0000 mg | ORAL_TABLET | Freq: Three times a day (TID) | ORAL | 0 refills | Status: DC | PRN

## 2020-10-15 MED ORDER — ARIPIPRAZOLE 20 MG PO TABS: 20.0000 mg | ORAL_TABLET | Freq: Every day | ORAL | 0 refills | Status: DC

## 2020-10-15 MED ORDER — TRAZODONE HCL 50 MG PO TABS: 50.0000 mg | ORAL_TABLET | Freq: Every evening | ORAL | 0 refills | Status: DC | PRN

## 2020-10-15 MED ORDER — CARBAMAZEPINE 100 MG PO CHEW: 100.0000 mg | CHEWABLE_TABLET | Freq: Three times a day (TID) | ORAL | 0 refills | Status: DC

## 2020-10-15 MED ORDER — RISPERIDONE 4 MG PO TBDP: 4.0000 mg | ORAL_TABLET | Freq: Every day | ORAL | 0 refills | Status: DC

## 2020-10-15 NOTE — Progress Notes (Signed)
Recreation Therapy Notes  INPATIENT RECREATION TR PLAN  Patient Details Name: Stephanie [name] MRN: [id number] DOB: 1976-06-13 Today's Date: 10/15/2020  Rec Therapy Plan Is patient appropriate for Therapeutic Recreation?: Yes Treatment times per week: about 3 days Estimated Length of Stay: 5-7 days TR Treatment/Interventions: Group participation (Comment)  Discharge Criteria Pt will be discharged from therapy if:: Discharged Treatment plan/goals/alternatives discussed and agreed upon by:: Patient/family  Discharge Summary Short term goals set: See pt care plan Short term goals met: Adequate for discharge Progress toward goals comments: Groups attended Which groups?: Other (Comment) (Support Systems; Triggers) Reason goals not met: None Therapeutic equipment acquired: N/A Reason patient discharged from therapy: Discharge from hospital Pt/family agrees with progress & goals achieved: Yes Date patient discharged from therapy: 10/15/20    Victorino Sparrow, LRT/CTRS  Ria Comment, Jonel Sick A 10/15/2020, 12:04 PM

## 2020-10-15 NOTE — Progress Notes (Signed)
Cooperative with treatment, patient spent most of shift resting in bed, patient was medication compliant. She remains depressed, paranoid but she denies AVH. Patient was mostly isolative to her room and spent most of shift resting in bed.

## 2020-10-15 NOTE — Plan of Care (Signed)
Pt was able to show some improvement in communication skills at completion of recreation therapy group sessions.    Caroll Rancher, LRT/CTRS

## 2020-10-15 NOTE — Progress Notes (Signed)
  [facility] Adult Case Management Discharge Plan :  Will you be returning to the same living situation after discharge:  Yes,  to apartment   At discharge, do you have transportation home?: No. Do you have the ability to pay for your medications: No.  Release of information consent forms completed and in the chart;  Patient's signature needed at discharge.  Patient to Follow up at:  Follow-up Information    [facility]. Go on 10/24/2020.   Specialty: Behavioral Health Why: 10/24/20 at 12:00 pm for therapy services.  You also have an appointment for medication management on 11/10/20 at 7:45 am.  Both appointments will be walk in, first come, first served, held in person. Contact information: [address] [place] [address] [phone]              Next level of care provider has access to [facility] Link:no  Safety Planning and Suicide Prevention discussed: Yes,  with patient  Have you used any form of tobacco in the last 30 days? (Cigarettes, Smokeless Tobacco, Cigars, and/or Pipes): No  Has patient been referred to the Quitline?: N/A patient is not a smoker  Patient has been referred for addiction treatment: N/A  Otelia Santee, LCSW 10/15/2020, 9:46 AM

## 2020-10-15 NOTE — Progress Notes (Signed)
RN met with pt and reviewed pt's discharge instructions.  Pt verbalized understanding of discharge instructions and pt did not have any questions. RN reviewed and provided pt with a copy of SRA, AVS and Transition Record.  RN returned pt's belongings to pt. Paper prescriptions and medication samples were given to pt.  Pt denied SI/HI/AVH and voiced no concerns.  Pt was appreciative of the care pt received at [facility].  Patient discharged to the lobby without incident.

## 2020-10-15 NOTE — BHH Suicide Risk Assessment (Signed)
[facility] Discharge Suicide Risk Assessment   Principal Problem: <principal problem not specified> Discharge Diagnoses: Active Problems:   Schizophrenia (HCC)   Total Time spent with patient: 20 minutes  Musculoskeletal: Strength & Muscle Tone: within normal limits Gait & Station: normal Patient leans: N/A  Psychiatric Specialty Exam: Review of Systems  Constitutional: Negative for activity change and appetite change.  Respiratory: Negative for chest tightness.   Cardiovascular: Negative for chest pain.  Gastrointestinal: Negative for abdominal pain.  Psychiatric/Behavioral: Negative for agitation, dysphoric mood and hallucinations.    Blood pressure (!) 144/83, pulse 72, temperature 98.4 F (36.9 C), temperature source Oral, resp. rate 20, height 5\' 1"  (1.549 m), weight 71.7 kg, SpO2 100 %.Body mass index is 29.85 kg/m.  General Appearance: Casual and Fairly Groomed  Eye Contact::  Good  Speech:  Clear and Coherent and Normal Rate409  Volume:  Normal  Mood:  "fair"  Affect:  Appropriate, Congruent and Constricted  Thought Process:  Linear  Orientation:  Full (Time, Place, and Person)  Thought Content:  WDL  Suicidal Thoughts:  No  Homicidal Thoughts:  No  Memory:  Immediate;   Good Recent;   Good  Judgement:  Good  Insight:  Good  Psychomotor Activity:  Normal  Concentration:  Good  Recall:  Good  Fund of Knowledge:Good  Language: Good  Akathisia:  No  Handed:  Right  AIMS (if indicated):     Assets:  Communication Skills Desire for Improvement Housing Physical Health  Sleep:  Number of Hours: 4  Cognition: WNL  ADL's:  Intact   Mental Status Per Nursing Assessment::   On Admission:  NA  Demographic Factors:  Low socioeconomic status and Living alone  Loss Factors: medication non compliance, h/o chronic mental illness  Historical Factors: prior admissions  Risk Reduction Factors:   Positive coping skills or problem solving skills  Continued Clinical  Symptoms:  Previous Psychiatric Diagnoses and Treatments  Cognitive Features That Contribute To Risk:  None    Suicide Risk:  Minimal: No identifiable suicidal ideation.  Patients presenting with no risk factors but with morbid ruminations; may be classified as minimal risk based on the severity of the depressive symptoms   Follow-up Information    [facility]. Go on 10/24/2020.   Specialty: Behavioral Health Why: 10/24/20 at 12:00 pm for therapy services.  You also have an appointment for medication management on 11/10/20 at 7:45 am.  Both appointments will be walk in, first come, first served, held in person. Contact information: [address] [place] [phone]              Plan Of Care/Follow-up recommendations:  Activity:  as tolerated Diet:  regular Other:      Carbamazepine level therapeutic at 9.2 at time of discharge. Patient to follow up [phone] above for continued care  Patient is instructed prior to discharge to: Take all medications as prescribed by his/her mental healthcare provider. Report any adverse effects and or reactions from the medicines to his/her outpatient provider promptly. Patient has been instructed & cautioned: To not engage in alcohol and or illegal drug use while on prescription medicines. In the event of worsening symptoms, patient is instructed to call the crisis hotline, 911 and or go to the nearest ED for appropriate evaluation and treatment of symptoms. To follow-up with his/her primary care provider for your other medical issues, concerns and or health care needs.     [place], MD 10/15/2020,  9:38 AM
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
## 2020-10-15 NOTE — Progress Notes (Signed)
The focus of this group is to help patients establish daily goals to achieve during treatment and discuss how the patient can incorporate goal setting into their daily lives to aide in recovery. Patient attend goal group.

## 2020-10-24 ENCOUNTER — Emergency Department (HOSPITAL_COMMUNITY)
Admission: EM | Admit: 2020-10-24 | Discharge: 2020-10-25 | Disposition: A | Discharge status: Home / Self Care | Payer: Self-pay | Attending: Emergency Medicine | Admitting: Emergency Medicine

## 2020-10-24 ENCOUNTER — Emergency Department (HOSPITAL_COMMUNITY): Payer: Self-pay

## 2020-10-24 ENCOUNTER — Encounter (HOSPITAL_COMMUNITY): Payer: Self-pay | Admitting: Emergency Medicine

## 2020-10-24 DIAGNOSIS — G4489 Other headache syndrome: Secondary | ICD-10-CM | POA: Insufficient documentation

## 2020-10-24 DIAGNOSIS — R072 Precordial pain: Secondary | ICD-10-CM | POA: Insufficient documentation

## 2020-10-24 LAB — BASIC METABOLIC PANEL
Anion gap: 14 (ref 5–15)
BUN: 15 mg/dL (ref 6–20)
CO2: 22 mmol/L (ref 22–32)
Calcium: 10.2 mg/dL (ref 8.9–10.3)
Chloride: 103 mmol/L (ref 98–111)
Creatinine, Ser: 1.04 mg/dL — ABNORMAL HIGH (ref 0.44–1.00)
GFR, Estimated: >60 mL/min (ref >60)
Glucose, Bld: 81 mg/dL (ref 70–99)
Potassium: 3.9 mmol/L (ref 3.5–5.1)
Sodium: 139 mmol/L (ref 135–145)

## 2020-10-24 LAB — TROPONIN I (HIGH SENSITIVITY): Troponin I (High Sensitivity): 8 ng/L (ref <18)

## 2020-10-24 LAB — CBC
HCT: 40.1 % (ref 36.0–46.0)
Hemoglobin: 12 g/dL (ref 12.0–15.0)
MCH: 24.9 pg — ABNORMAL LOW (ref 26.0–34.0)
MCHC: 29.9 g/dL — ABNORMAL LOW (ref 30.0–36.0)
MCV: 83.2 fL (ref 80.0–100.0)
Platelets: 415 10*3/uL — ABNORMAL HIGH (ref 150–400)
RBC: 4.82 MIL/uL (ref 3.87–5.11)
RDW: 16.6 % — ABNORMAL HIGH (ref 11.5–15.5)
WBC: 5.6 10*3/uL (ref 4.0–10.5)
nRBC: 0 % (ref 0.0–0.2)

## 2020-10-24 LAB — I-STAT BETA HCG BLOOD, ED (MC, WL, AP ONLY): I-stat hCG, quantitative: <5 m[IU]/mL (ref <5)

## 2020-10-24 MED ORDER — IBUPROFEN 200 MG PO TABS
400.0000 mg | ORAL_TABLET | Freq: Once | ORAL | Status: AC
  Administered 2020-10-25: 400 mg via ORAL
  Filled 2020-10-24: qty 2

## 2020-10-24 NOTE — ED Triage Notes (Signed)
Pt states she feels like she's being "shocked with electricity" in her head and chest. Reports sx began earlier today. States has hx of migraine, but this "feels different". Denies pain, shob. Endorses one emesis episode yesterday and vertigo.

## 2020-10-25 NOTE — ED Provider Notes (Signed)
[facility]-EMERGENCY DEPT Provider Note   CSN: [id number] Arrival date & time: 10/24/20  1736     History Chief Complaint  Patient presents with  . Headache  . Chest Pain    LANYLA COSTELLO is a 44 y.o. female.  The history is provided by the patient.  Headache Pain location:  R parietal Quality: "electric shock" Radiates to:  Does not radiate Onset quality:  Sudden Timing:  Intermittent Progression:  Unchanged Chronicity:  New Relieved by:  Nothing Worsened by:  Nothing Associated symptoms: no blurred vision, no fever, no hearing loss, no numbness, no syncope, no visual change and no weakness   Chest Pain Associated symptoms: headache   Associated symptoms: no fever, no numbness, no shortness of breath, no syncope and no weakness   Patient with history of schizophrenia presents with headache and chest pain.  Patient reports over the past day she has had intermittent episodes of a "electric shock "in her right side of her head.  It lasts for 1 second.  Denies visual changes or any weakness.  She reports history of migraines but this feels different.  She also reports associated right-sided chest pain that feels like a shock that lasts a second No Shortness of breath. No recent falls or trauma Denies history of VTE/CVA/CAD     Past Medical History:  Diagnosis Date  . Schizophrenia [facility])     Patient Active Problem List   Diagnosis Date Noted  . Schizophrenia (HCC) 10/10/2020  . Schizoaffective disorder (HCC) 03/31/2019    Past Surgical History:  Procedure Laterality Date  . INDUCED ABORTION       OB History   No obstetric history on file.     Family History  Problem Relation Age of Onset  . Cancer Father     Social History   Tobacco Use  . Smoking status: Never Smoker  . Smokeless tobacco: Never Used  Vaping Use  . Vaping Use: Former  Substance Use Topics  . Alcohol use: Never  . Drug use: Never    Home  Medications Prior to Admission medications   Medication Sig Start Date End Date Taking? Authorizing Provider  ARIPiprazole (ABILIFY) 20 MG tablet Take 1 tablet (20 mg total) by mouth daily. For mood control 10/16/20   Armandina Stammer I, NP  carbamazepine (TEGRETOL) 100 MG chewable tablet Chew 1 tablet (100 mg total) by mouth 3 (three) times daily. For mood stabilization 10/15/20   Armandina Stammer I, NP  hydrOXYzine (ATARAX/VISTARIL) 25 MG tablet Take 1 tablet (25 mg total) by mouth 3 (three) times daily as needed for anxiety. 10/15/20   Armandina Stammer I, NP  risperiDONE (RISPERDAL M-TABS) 4 MG disintegrating tablet Take 1 tablet (4 mg total) by mouth at bedtime. For mood control 10/15/20   Armandina Stammer I, NP  traZODone (DESYREL) 50 MG tablet Take 1 tablet (50 mg total) by mouth at bedtime as needed for sleep. 10/15/20   Armandina Stammer I, NP    Allergies    Haldol [haloperidol lactate], Penicillins, and Penicillins  Review of Systems   Review of Systems  Constitutional: Negative for fever.  HENT: Negative for hearing loss.   Eyes: Negative for blurred vision.  Respiratory: Negative for shortness of breath.   Cardiovascular: Positive for chest pain. Negative for syncope.  Neurological: Positive for headaches. Negative for syncope, weakness and numbness.  All other systems reviewed and are negative.   Physical Exam Updated Vital Signs BP (!) 148/97 (BP Location: Left  Arm)   Pulse 80   Temp 98.2 F (36.8 C) (Oral)   Resp 16   Ht 1.549 m (5\' 1" )   Wt 71.7 kg   SpO2 100%   BMI 29.85 kg/m   Physical Exam CONSTITUTIONAL: Well developed/well nourished, watching television HEAD: Normocephalic/atraumatic EYES: EOMI/PERRL, no nystagmus, no ptosis, no visual field deficit  ENMT: Mucous membranes moist NECK: supple no meningeal signs, no bruits SPINE/BACK:entire spine nontender CV: S1/S2 noted, no murmurs/rubs/gallops noted LUNGS: Lungs are clear to auscultation bilaterally, no apparent  distress ABDOMEN: soft, nontender, no rebound or guarding GU:no cva tenderness NEURO:Awake/alert, face symmetric, no arm or leg drift is noted Equal 5/5 strength with shoulder abduction, elbow flex/extension, wrist flex/extension in upper extremities and equal hand grips bilaterally Equal 5/5 strength with hip flexion,knee flex/extension, foot dorsi/plantar flexion Cranial nerves 3/4/5/6/05/30/09/11/12 tested and intact Gait normal without ataxia No past pointing Sensation to light touch intact in all extremities EXTREMITIES: pulses normal, full ROM SKIN: warm, color normal PSYCH: no abnormalities of mood noted, alert and oriented to situation   ED Results / Procedures / Treatments   Labs (all labs ordered are listed, but only abnormal results are displayed) Labs Reviewed  BASIC METABOLIC PANEL - Abnormal; Notable for the following components:      Result Value   Creatinine, Ser 1.04 (*)    All other components within normal limits  CBC - Abnormal; Notable for the following components:   MCH 24.9 (*)    MCHC 29.9 (*)    RDW 16.6 (*)    Platelets 415 (*)    All other components within normal limits  I-STAT BETA HCG BLOOD, ED ([facility], [facility], [facility] ONLY)  TROPONIN I (HIGH SENSITIVITY)    EKG EKG Interpretation  Date/Time:  Friday October 24 2020 18:10:30 EST Ventricular Rate:  82 PR Interval:    QRS Duration: 84 QT Interval:  329 QTC Calculation: 385 R Axis:   80 Text Interpretation: Sinus rhythm Right atrial enlargement Left ventricular hypertrophy No significant change since last tracing Confirmed by [id number] (Zadie Rhine) on 10/25/2020 12:12:20 AM   Radiology DG Chest 2 View  Result Date: 10/24/2020 CLINICAL DATA:  Chest pain, sensation of the shock electricity in head and chest which began earlier today, history of migraine with difference its a shins EXAM: CHEST - 2 VIEW COMPARISON:  Radiograph 04/21/2019 FINDINGS: No consolidation, features of edema, pneumothorax, or  effusion. Pulmonary vascularity is normally distributed. The cardiomediastinal contours are unremarkable. No acute osseous or soft tissue abnormality. IMPRESSION: No acute cardiopulmonary abnormality. Electronically Signed   By: 04/23/2019 M.D.   On: 10/24/2020 19:27    Procedures Procedures   Medications Ordered in ED Medications  ibuprofen (ADVIL) tablet 400 mg (400 mg Oral Given 10/25/20 0014)    ED Course  I have reviewed the triage vital signs and the nursing notes.  Pertinent labs & imaging results that were available during my care of the patient were reviewed by me and considered in my medical decision making (see chart for details).    MDM Rules/Calculators/A&P                          12:15 AM Patient is very well-appearing.  She has 2 separate complaints.  In terms of her headache she reports brief shocks of pain in the right side of her head.  On my exam she is neurologically intact and in no acute distress.  She walks without difficulty.  She also reports brief episodes of chest pain.  No acute EKG changes.  Chest x-ray has been reviewed and is negative.  There is no hypoxia.  I have low suspicion for ACS/PE at this time.  In terms of her headache I did offer a CT head, but she is unsure if she would undergo this procedure 1:07 AM Patient reports feeling improved will be discharged home.  She has been given outpatient follow-up information Final Clinical Impression(s) / ED Diagnoses Final diagnoses:  Other headache syndrome  Precordial pain    Rx / DC Orders ED Discharge Orders    None       Zadie Rhine, MD 10/25/20 0107

## 2020-10-25 NOTE — Discharge Instructions (Signed)

## 2020-10-25 NOTE — Progress Notes (Signed)
TOC CM attempted to contact pt with instructions on PCP.  No number listed for pt. Pt had [facility] listed on dc instructions to call and arrange appt. No emergency contacts listed. Isidoro Donning RN CCM, [facility] ED TOC CM [phone]

## 2020-11-06 ENCOUNTER — Emergency Department (HOSPITAL_COMMUNITY)
Admission: EM | Admit: 2020-11-06 | Discharge: 2020-11-07 | Disposition: A | Discharge status: Other Acute Inpatient Hospital | Payer: Self-pay | Attending: Emergency Medicine | Admitting: Emergency Medicine

## 2020-11-06 ENCOUNTER — Encounter (HOSPITAL_COMMUNITY): Payer: Self-pay

## 2020-11-06 ENCOUNTER — Other Ambulatory Visit: Payer: Self-pay

## 2020-11-06 DIAGNOSIS — Z20822 Contact with and (suspected) exposure to covid-19: Secondary | ICD-10-CM | POA: Insufficient documentation

## 2020-11-06 DIAGNOSIS — R45850 Homicidal ideations: Secondary | ICD-10-CM | POA: Insufficient documentation

## 2020-11-06 DIAGNOSIS — R443 Hallucinations, unspecified: Secondary | ICD-10-CM

## 2020-11-06 DIAGNOSIS — F259 Schizoaffective disorder, unspecified: Secondary | ICD-10-CM | POA: Insufficient documentation

## 2020-11-06 LAB — COMPREHENSIVE METABOLIC PANEL
ALT: 17 U/L (ref 0–44)
AST: 22 U/L (ref 15–41)
Albumin: 4.6 g/dL (ref 3.5–5.0)
Alkaline Phosphatase: 82 U/L (ref 38–126)
Anion gap: 15 (ref 5–15)
BUN: 14 mg/dL (ref 6–20)
CO2: 21 mmol/L — ABNORMAL LOW (ref 22–32)
Calcium: 9.5 mg/dL (ref 8.9–10.3)
Chloride: 102 mmol/L (ref 98–111)
Creatinine, Ser: 1.01 mg/dL — ABNORMAL HIGH (ref 0.44–1.00)
GFR, Estimated: >60 mL/min (ref >60)
Glucose, Bld: 62 mg/dL — ABNORMAL LOW (ref 70–99)
Potassium: 3.6 mmol/L (ref 3.5–5.1)
Sodium: 138 mmol/L (ref 135–145)
Total Bilirubin: 0.6 mg/dL (ref 0.3–1.2)
Total Protein: 8.1 g/dL (ref 6.5–8.1)

## 2020-11-06 LAB — RAPID URINE DRUG SCREEN, HOSP PERFORMED
Amphetamines: NOT DETECTED
Barbiturates: NOT DETECTED
Benzodiazepines: NOT DETECTED
Cocaine: NOT DETECTED
Opiates: NOT DETECTED
Tetrahydrocannabinol: NOT DETECTED

## 2020-11-06 LAB — CBC
HCT: 37.4 % (ref 36.0–46.0)
Hemoglobin: 11.3 g/dL — ABNORMAL LOW (ref 12.0–15.0)
MCH: 25.5 pg — ABNORMAL LOW (ref 26.0–34.0)
MCHC: 30.2 g/dL (ref 30.0–36.0)
MCV: 84.2 fL (ref 80.0–100.0)
Platelets: 346 10*3/uL (ref 150–400)
RBC: 4.44 MIL/uL (ref 3.87–5.11)
RDW: 16.9 % — ABNORMAL HIGH (ref 11.5–15.5)
WBC: 5.6 10*3/uL (ref 4.0–10.5)
nRBC: 0 % (ref 0.0–0.2)

## 2020-11-06 LAB — SALICYLATE LEVEL: Salicylate Lvl: <7 mg/dL — ABNORMAL LOW (ref 7.0–30.0)

## 2020-11-06 LAB — ETHANOL: Alcohol, Ethyl (B): <10 mg/dL (ref <10)

## 2020-11-06 LAB — ACETAMINOPHEN LEVEL: Acetaminophen (Tylenol), Serum: <10 ug/mL — ABNORMAL LOW (ref 10–30)

## 2020-11-06 LAB — I-STAT BETA HCG BLOOD, ED (MC, WL, AP ONLY): I-stat hCG, quantitative: <5 m[IU]/mL (ref <5)

## 2020-11-06 MED ORDER — ZOLPIDEM TARTRATE 5 MG PO TABS: 5.0000 mg | ORAL_TABLET | Freq: Every evening | ORAL | Status: DC | PRN

## 2020-11-06 MED ORDER — NICOTINE 21 MG/24HR TD PT24: 21.0000 mg | MEDICATED_PATCH | Freq: Every day | TRANSDERMAL | Status: DC

## 2020-11-06 MED ORDER — ACETAMINOPHEN 325 MG PO TABS: 650.0000 mg | ORAL_TABLET | ORAL | Status: DC | PRN

## 2020-11-06 MED ORDER — ALUM & MAG HYDROXIDE-SIMETH 200-200-20 MG/5ML PO SUSP: 30.0000 mL | Freq: Four times a day (QID) | ORAL | Status: DC | PRN

## 2020-11-06 MED ORDER — ONDANSETRON HCL 4 MG PO TABS: 4.0000 mg | ORAL_TABLET | Freq: Three times a day (TID) | ORAL | Status: DC | PRN

## 2020-11-06 NOTE — ED Provider Notes (Addendum)
[facility]-EMERGENCY DEPT Provider Note   CSN: [id number] Arrival date & time: 11/06/20  2149     History Chief Complaint  Patient presents with  . Homicidal  . Hearing voices    Stephanie [name] is a 44 y.o. female.  The history is provided by the patient and medical records.   44 y.o. F with hx of schizophrenia, presenting to the ED for hallucinations. Patient states she hears voices telling her to kill herself and other people but she does not actually feel suicidal and does not want to hurt anyone else.  No plan voiced.   States she has not been taking her medications.  Denies drug or alcohol abuse.  Past Medical History:  Diagnosis Date  . Schizophrenia [facility])     Patient Active Problem List   Diagnosis Date Noted  . Schizophrenia (HCC) 10/10/2020  . Schizoaffective disorder (HCC) 03/31/2019    Past Surgical History:  Procedure Laterality Date  . INDUCED ABORTION       OB History   No obstetric history on file.     Family History  Problem Relation Age of Onset  . Cancer Father     Social History   Tobacco Use  . Smoking status: Never Smoker  . Smokeless tobacco: Never Used  Vaping Use  . Vaping Use: Former  Substance Use Topics  . Alcohol use: Never  . Drug use: Never    Home Medications Prior to Admission medications   Not on File    Allergies    Haldol [haloperidol lactate], Penicillins, and Penicillins  Review of Systems   Review of Systems  Psychiatric/Behavioral: Positive for hallucinations.  All other systems reviewed and are negative.   Physical Exam Updated Vital Signs BP (!) 146/85   Pulse 75   Temp 98 F (36.7 C)   Resp 18   LMP  (LMP Unknown)   SpO2 99%   Physical Exam Vitals and nursing note reviewed.  Constitutional:      Appearance: She is well-developed and well-nourished.  HENT:     Head: Normocephalic and atraumatic.     Mouth/Throat:     Mouth: Oropharynx is clear and moist.  Eyes:      Extraocular Movements: EOM normal.     Conjunctiva/sclera: Conjunctivae normal.     Pupils: Pupils are equal, round, and reactive to light.  Cardiovascular:     Rate and Rhythm: Normal rate and regular rhythm.     Heart sounds: Normal heart sounds.  Pulmonary:     Effort: Pulmonary effort is normal.     Breath sounds: Normal breath sounds.  Abdominal:     General: Bowel sounds are normal.     Palpations: Abdomen is soft.  Musculoskeletal:        General: Normal range of motion.     Cervical back: Normal range of motion.  Skin:    General: Skin is warm and dry.  Neurological:     Mental Status: She is alert and oriented to person, place, and time.  Psychiatric:        Attention and Perception: She perceives auditory hallucinations.        Mood and Affect: Mood and affect normal.     Comments: Overall calm and cooperative Endorses auditory hallucinations but does not appear to be responding to stimuli on my exam     ED Results / Procedures / Treatments   Labs (all labs ordered are listed, but only abnormal results are displayed)  Labs Reviewed  COMPREHENSIVE METABOLIC PANEL - Abnormal; Notable for the following components:      Result Value   CO2 21 (*)    Glucose, Bld 62 (*)    Creatinine, Ser 1.01 (*)    All other components within normal limits  SALICYLATE LEVEL - Abnormal; Notable for the following components:   Salicylate Lvl <7.0 (*)    All other components within normal limits  ACETAMINOPHEN LEVEL - Abnormal; Notable for the following components:   Acetaminophen (Tylenol), Serum <10 (*)    All other components within normal limits  CBC - Abnormal; Notable for the following components:   Hemoglobin 11.3 (*)    MCH 25.5 (*)    RDW 16.9 (*)    All other components within normal limits  RESP PANEL BY RT-PCR (FLU A&B, COVID) ARPGX2  ETHANOL  RAPID URINE DRUG SCREEN, HOSP PERFORMED  I-STAT BETA HCG BLOOD, ED ([facility], [facility], [facility] ONLY)    EKG None  Radiology No  results found.  Procedures Procedures (including critical care time)  Medications Ordered in ED Medications - No data to display  ED Course  I have reviewed the triage vital signs and the nursing notes.  Pertinent labs & imaging results that were available during my care of the patient were reviewed by me and considered in my medical decision making (see chart for details).    MDM Rules/Calculators/A&P  44 year old female presenting to the ED with hallucinations.  States she hears voices telling her to kill herself and others.  Does not feel like she actually wants to do this deep down and has no specific plan.  She has not been taking her psychiatric medications.  She denies any alcohol or illicit drug use.  She is calm and cooperative here, is not responding to internal stimuli during my exam.  Her labs are reassuring.  TTS has evaluated, has been accepted to [facility] for ongoing care.  covid screen negative.  EMTALA completed.  Safe transport contacted for transfer.  3:15 AM Notified by nursing staff that sitter walked patient to front door with belongings but did not directly hand her over to safe transport and patient disappeared from the parking lot with her belongings.  As she was accepted to [facility] for psychiatric stabilization, will take out IVC so [employer] can find her.  TTS has been updated about this event.  Once patient is located, will remain in ED due to IVC. Safety zone portal has been completed by charge RN.  6:18 AM [employer] has still not been able to locate patient.  Will notify oncoming team about situation and patient's anticipated return to ED once located.  Final Clinical Impression(s) / ED Diagnoses Final diagnoses:  Hallucinations    Rx / DC Orders ED Discharge Orders    None       Garlon Hatchet, PA-C 11/07/20 0149    Ward, Layla Maw, DO 11/07/20 0212    Garlon Hatchet, PA-C 11/07/20 0620    Ward, Layla Maw, DO 11/07/20 [phone]

## 2020-11-06 NOTE — ED Triage Notes (Signed)
Pt presents with c/o hearing voices that are telling her to kill herself and to kill other people. Pt does report that she doesn't have any plans of how she would hurt herself or others but does hear the voices. Pt denies feeling suicidal at this time. Pt is calm and cooperative.

## 2020-11-07 LAB — RESP PANEL BY RT-PCR (FLU A&B, COVID) ARPGX2
Influenza A by PCR: NEGATIVE
Influenza B by PCR: NEGATIVE
SARS Coronavirus 2 by RT PCR: NEGATIVE

## 2020-11-07 NOTE — ED Notes (Signed)
Patient was out to the lobby by the sitter but she never made it to the car with safe transport.
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
## 2020-11-07 NOTE — ED Notes (Signed)
Assumed care of patient at this time, patient states that she is hearing voices that are telling her to hurt other people, denies SI.

## 2020-11-07 NOTE — ED Notes (Signed)
TTS completed. 

## 2020-11-07 NOTE — [facility] Counselor (Signed)
Per Misty Stanley, [place] pt left the ED after receiving her bags from the sitter. PA to complete IVC paperwork on pt. Pt to return to ED once located by [employer]. Clinician updateed Nira Conn, PMHNP and nursing staff at [facility].    Redmond Pulling, MS, [facility], [facility] Triage Specialist [phone]

## 2020-11-07 NOTE — ED Notes (Addendum)
Dr. Elesa Massed made aware, [facility] notified. Safe report completed.

## 2020-11-07 NOTE — ED Notes (Signed)
Dr. Elesa Massed decided place the patient under IVC after being told she did not leave w/ Safe Transport.  Paperwork has been faxed and [employer] is aware.
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
## 2021-03-21 IMAGING — DX LEFT FOOT - COMPLETE 3+ VIEW
3 series · 3 of 3 positions shown · non-contrast
Comparison: None.

CLINICAL DATA: 43-year-old homeless female with pain.

EXAM:
LEFT FOOT - COMPLETE 3+ VIEW

[foot ap]
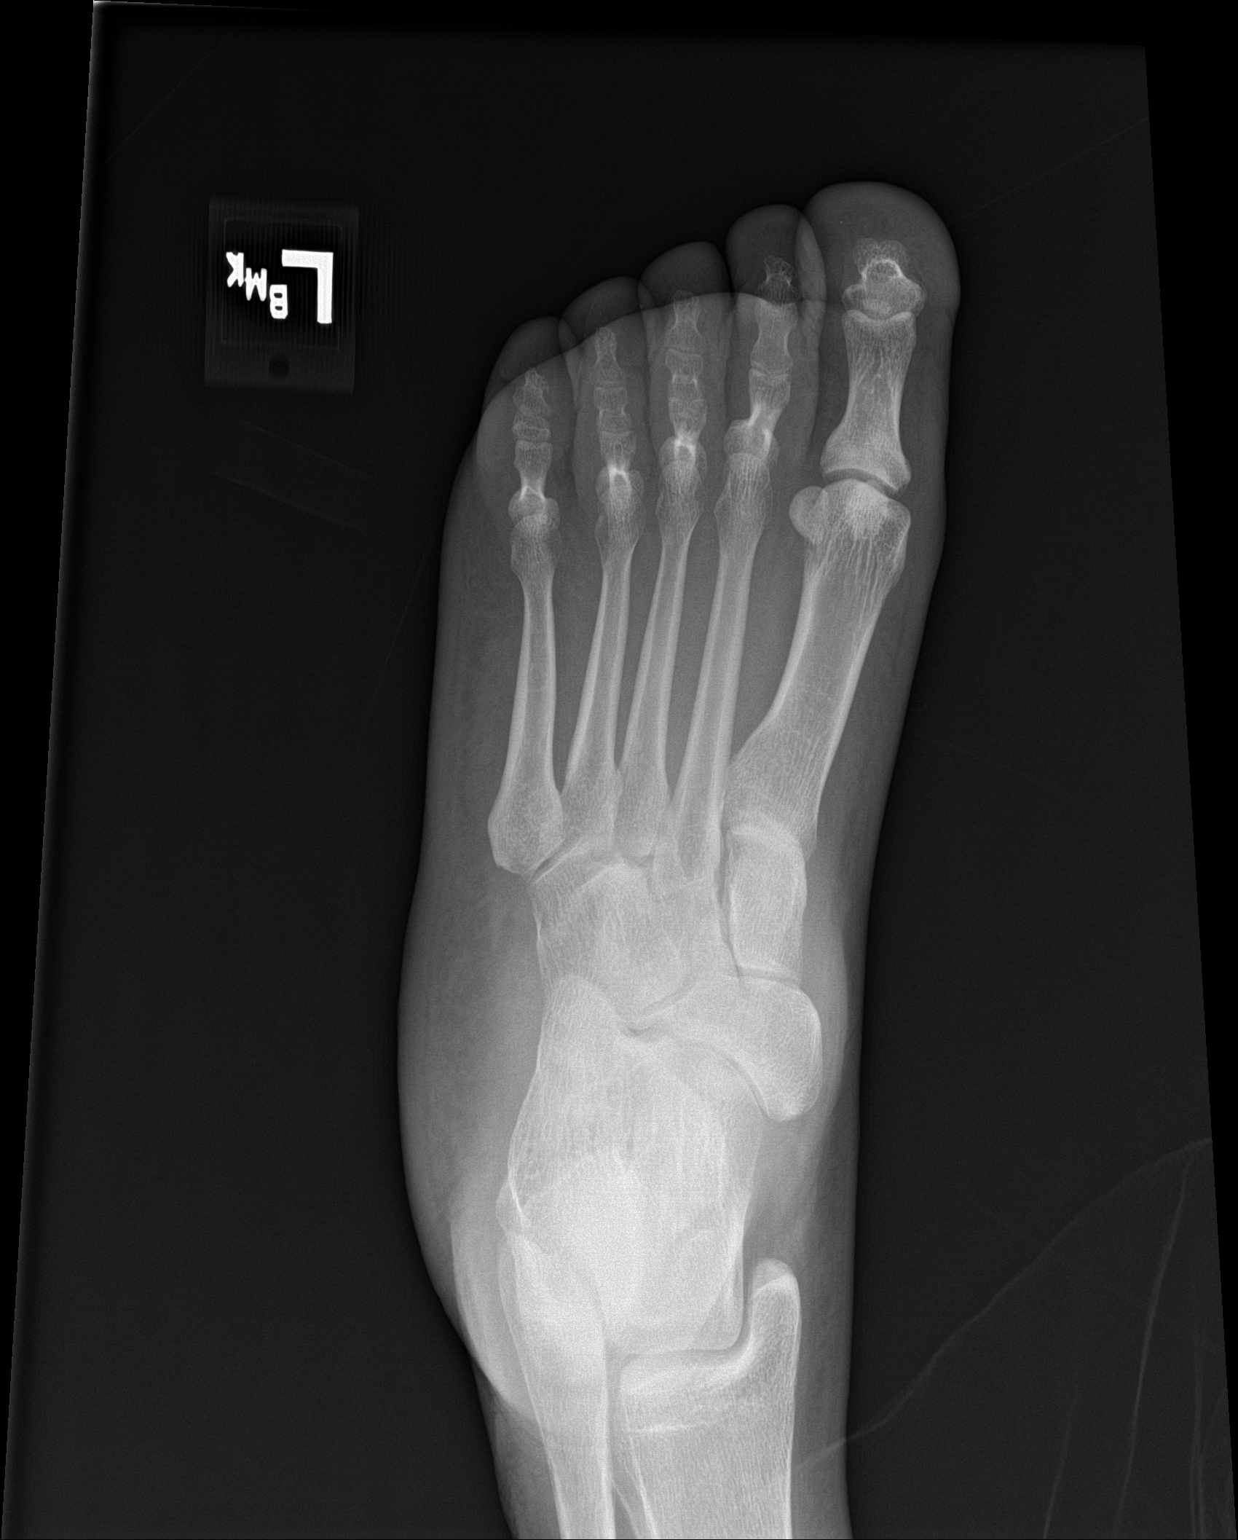

[foot obl]
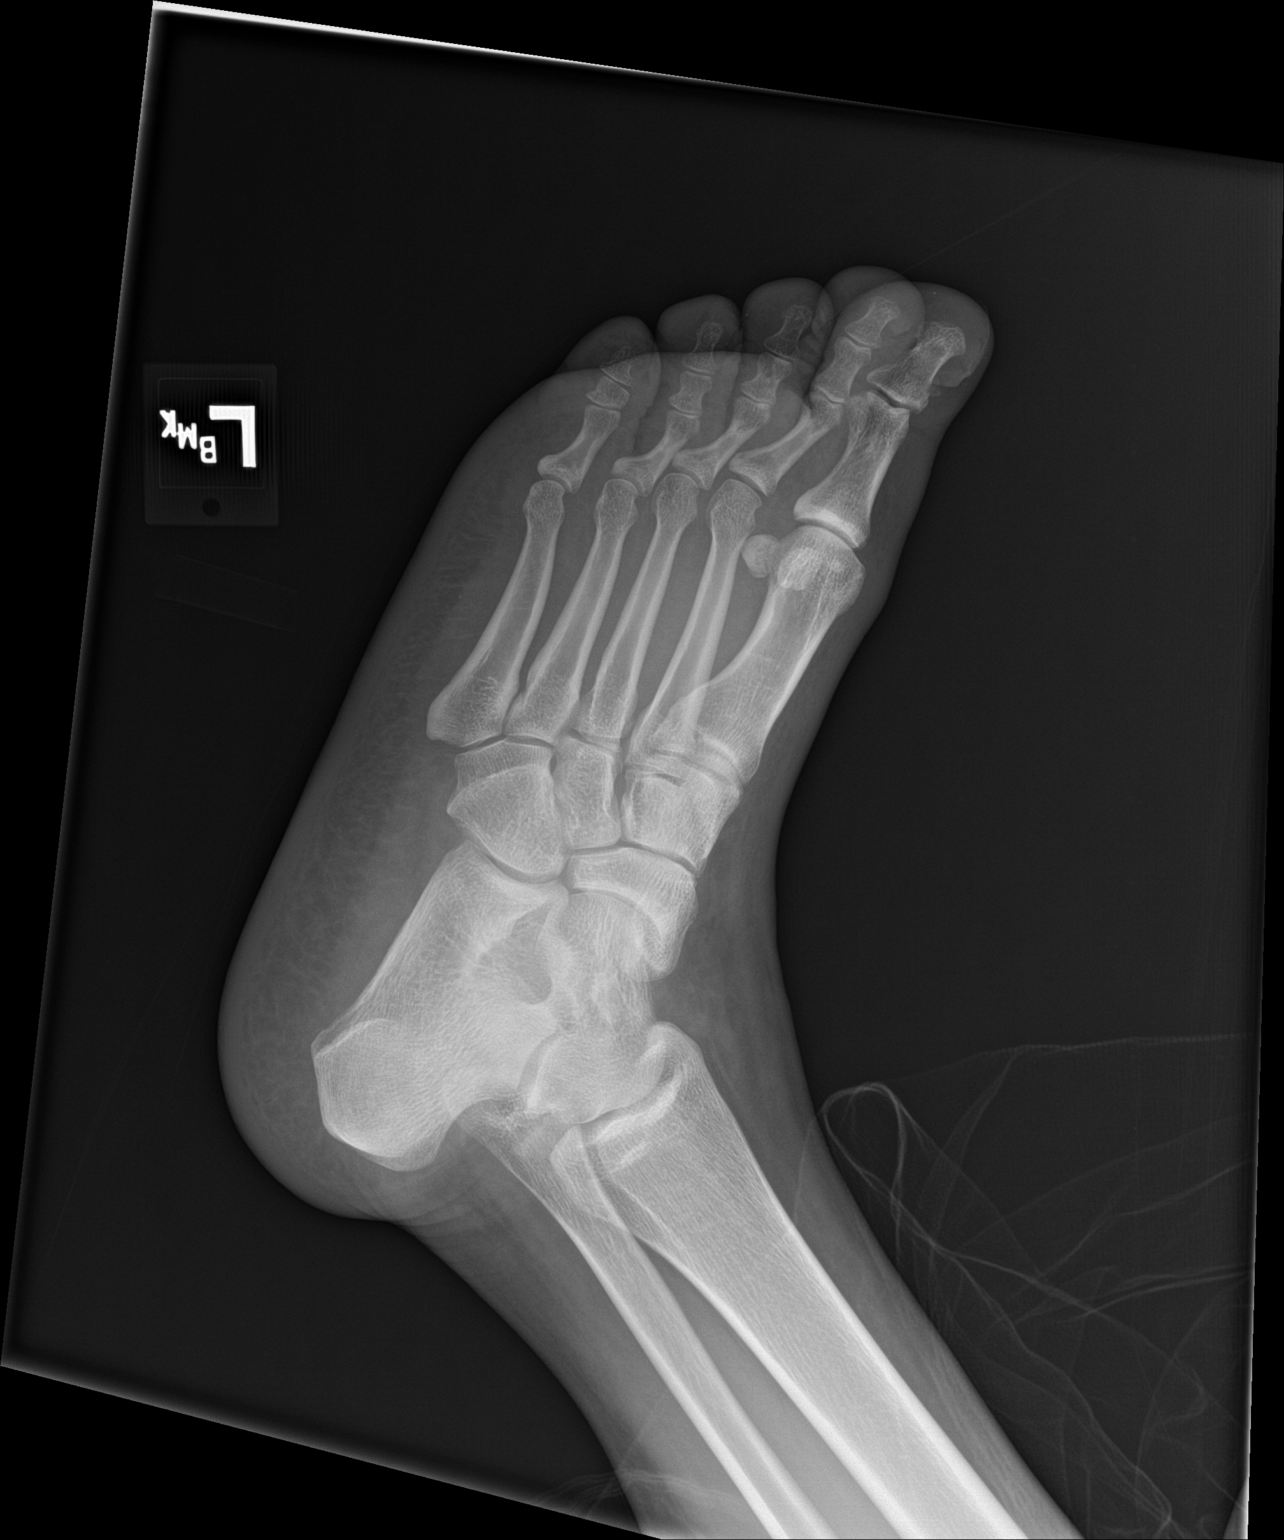

[foot lat]
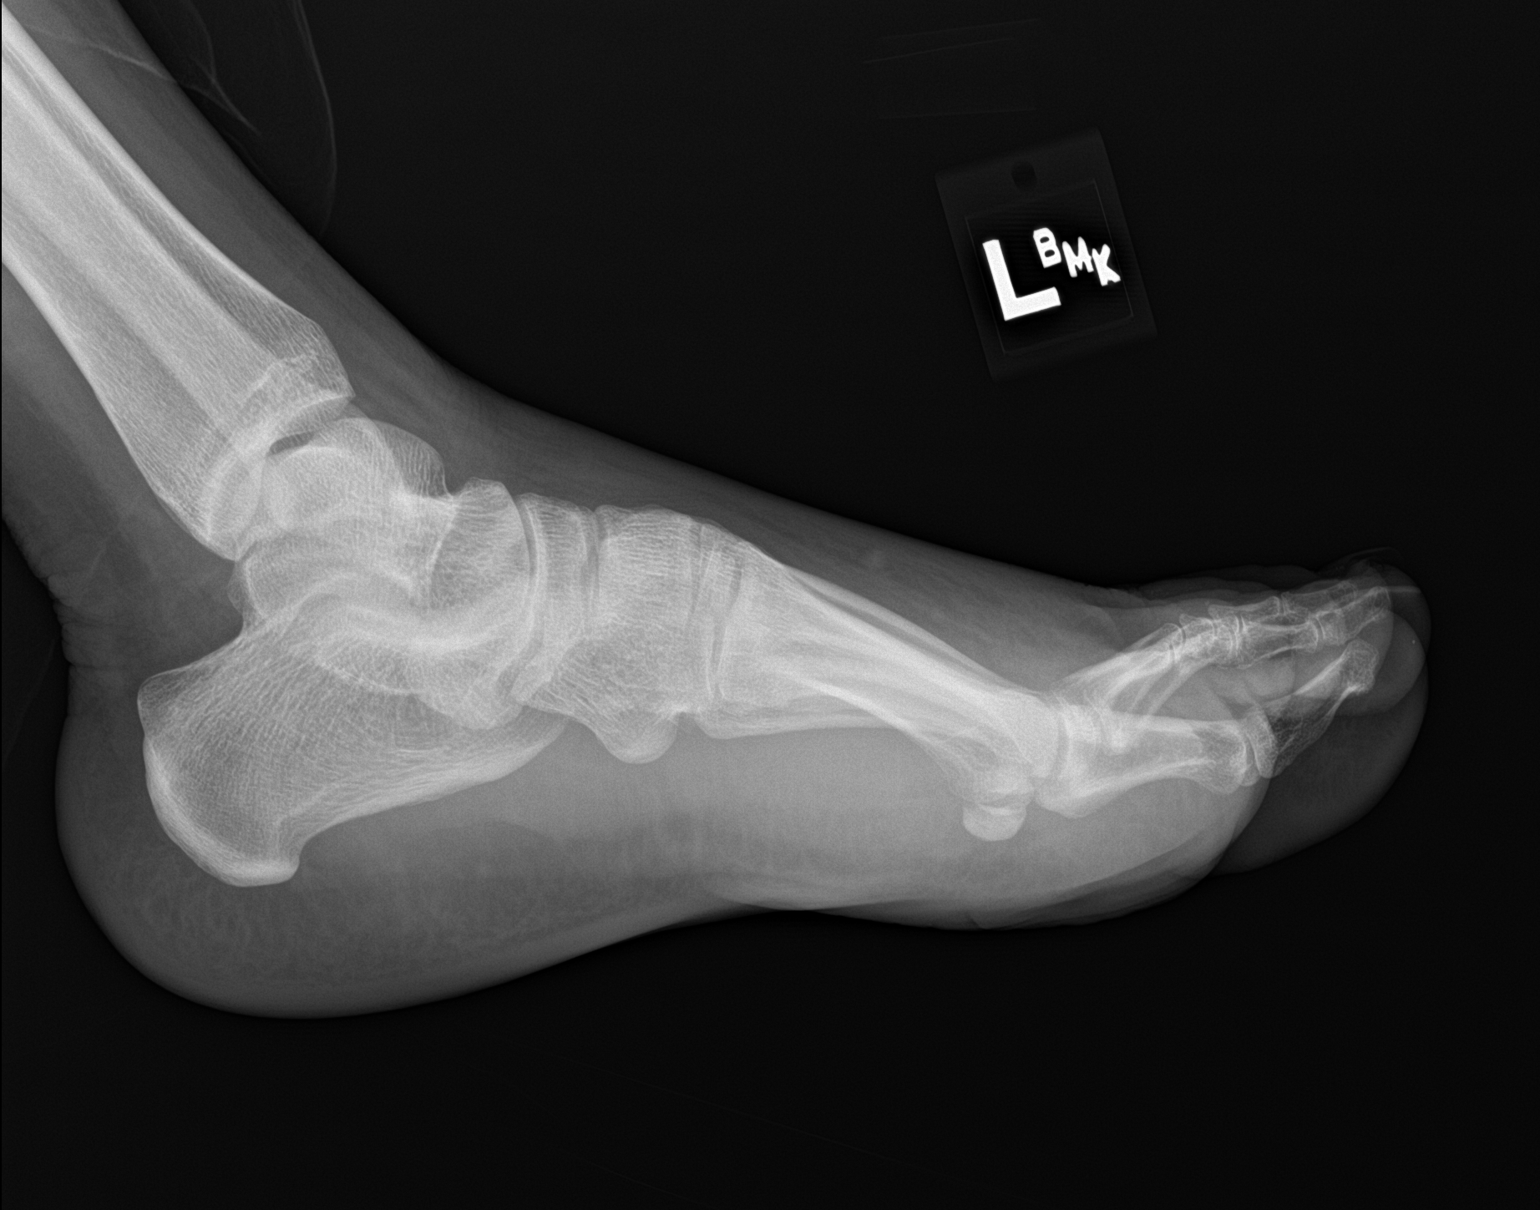

[3 of 3 positions shown; findings below may reference images not displayed]

FINDINGS: Generalized soft tissue swelling. No soft tissue gas. Bone
mineralization is within normal limits. Calcaneus and tarsal bones
appear intact and normally aligned. Metatarsals and phalanges appear
intact and normally aligned. Normal joint spaces. No acute osseous
abnormality identified.
IMPRESSION: Generalized soft tissue swelling with no osseous abnormality
identified.

## 2021-03-31 ENCOUNTER — Other Ambulatory Visit: Payer: Self-pay

## 2021-03-31 ENCOUNTER — Encounter (HOSPITAL_COMMUNITY): Payer: Self-pay | Admitting: Emergency Medicine

## 2021-03-31 ENCOUNTER — Emergency Department (HOSPITAL_COMMUNITY)
Admission: EM | Admit: 2021-03-31 | Discharge: 2021-04-01 | Disposition: A | Discharge status: Home / Self Care | Payer: Self-pay | Attending: Emergency Medicine | Admitting: Emergency Medicine

## 2021-03-31 DIAGNOSIS — R110 Nausea: Secondary | ICD-10-CM | POA: Insufficient documentation

## 2021-03-31 DIAGNOSIS — R059 Cough, unspecified: Secondary | ICD-10-CM | POA: Insufficient documentation

## 2021-03-31 DIAGNOSIS — R42 Dizziness and giddiness: Secondary | ICD-10-CM | POA: Insufficient documentation

## 2021-03-31 DIAGNOSIS — Z77098 Contact with and (suspected) exposure to other hazardous, chiefly nonmedicinal, chemicals: Secondary | ICD-10-CM | POA: Insufficient documentation

## 2021-03-31 DIAGNOSIS — R292 Abnormal reflex: Secondary | ICD-10-CM | POA: Insufficient documentation

## 2021-03-31 NOTE — ED Notes (Signed)
Unable to get blood due to stop of blood follow.

## 2021-03-31 NOTE — ED Provider Notes (Signed)
Emergency Medicine Provider Triage Evaluation Note  Stephanie [name] , a 45 y.o. female  was evaluated in triage.  Pt complains of productive cough, 2 weeks of diarrhea, intermittent clear vomiting. No abdominal pain, no fever.    Review of Systems  Positive: Cough, diarrhea Negative: Abdominal pain, fever, congestion, sore throat  Physical Exam  BP 106/87 (BP Location: Left Arm)   Pulse 86   Temp 98.1 F (36.7 C) (Oral)   Resp 18   Ht 5\' 1"  (1.549 m)   Wt 68 kg   LMP 03/28/2021   SpO2 99%   BMI 28.34 kg/m  Gen:   Awake, no distress   Resp:  Normal effort  MSK:   Moves extremities without difficulty    Medical Decision Making  Medically screening exam initiated at 10:15 PM.  Appropriate orders placed.  Stephanie [name] was informed that the remainder of the evaluation will be completed by another provider, this initial triage assessment does not replace that evaluation, and the importance of remaining in the ED until their evaluation is complete.  Labs, CXR, covid swab   Stephanie [name], Richardean Sale N, PA-C 03/31/21 2218    2219, MD 04/01/21 [phone]

## 2021-03-31 NOTE — ED Triage Notes (Signed)
Patient come from home by [facility]. Patient is complaining of a productive cough with white mucous. Patient is also having diarrhea.

## 2021-04-01 LAB — CBC WITH DIFFERENTIAL/PLATELET
Abs Immature Granulocytes: 0.03 10*3/uL (ref 0.00–0.07)
Basophils Absolute: 0 10*3/uL (ref 0.0–0.1)
Basophils Relative: 0 %
Eosinophils Absolute: 0 10*3/uL (ref 0.0–0.5)
Eosinophils Relative: 0 %
HCT: 43.6 % (ref 36.0–46.0)
Hemoglobin: 13 g/dL (ref 12.0–15.0)
Immature Granulocytes: 0 %
Lymphocytes Relative: 20 %
Lymphs Abs: 1.3 10*3/uL (ref 0.7–4.0)
MCH: 25.6 pg — ABNORMAL LOW (ref 26.0–34.0)
MCHC: 29.8 g/dL — ABNORMAL LOW (ref 30.0–36.0)
MCV: 85.8 fL (ref 80.0–100.0)
Monocytes Absolute: 0.6 10*3/uL (ref 0.1–1.0)
Monocytes Relative: 10 %
Neutro Abs: 4.7 10*3/uL (ref 1.7–7.7)
Neutrophils Relative %: 70 %
Platelets: 292 10*3/uL (ref 150–400)
RBC: 5.08 MIL/uL (ref 3.87–5.11)
RDW: 15.9 % — ABNORMAL HIGH (ref 11.5–15.5)
WBC: 6.8 10*3/uL (ref 4.0–10.5)
nRBC: 0 % (ref 0.0–0.2)

## 2021-04-01 LAB — COMPREHENSIVE METABOLIC PANEL
ALT: 12 U/L (ref 0–44)
AST: 19 U/L (ref 15–41)
Albumin: 4.3 g/dL (ref 3.5–5.0)
Alkaline Phosphatase: 95 U/L (ref 38–126)
Anion gap: 18 — ABNORMAL HIGH (ref 5–15)
BUN: 18 mg/dL (ref 6–20)
CO2: 15 mmol/L — ABNORMAL LOW (ref 22–32)
Calcium: 9.6 mg/dL (ref 8.9–10.3)
Chloride: 104 mmol/L (ref 98–111)
Creatinine, Ser: 1.19 mg/dL — ABNORMAL HIGH (ref 0.44–1.00)
GFR, Estimated: 57 mL/min — ABNORMAL LOW (ref >60)
Glucose, Bld: 53 mg/dL — ABNORMAL LOW (ref 70–99)
Potassium: 4.4 mmol/L (ref 3.5–5.1)
Sodium: 137 mmol/L (ref 135–145)
Total Bilirubin: 0.8 mg/dL (ref 0.3–1.2)
Total Protein: 8.4 g/dL — ABNORMAL HIGH (ref 6.5–8.1)

## 2021-04-01 LAB — CBG MONITORING, ED: Glucose-Capillary: 114 mg/dL — ABNORMAL HIGH (ref 70–99)

## 2021-04-01 LAB — CARBOXYHEMOGLOBIN - COOX: Carboxyhemoglobin: 0.5 % (ref 0.5–1.5)

## 2021-04-01 MED ORDER — ONDANSETRON HCL 4 MG/2ML IJ SOLN
4.0000 mg | Freq: Once | INTRAMUSCULAR | Status: AC
  Administered 2021-04-01: 4 mg via INTRAVENOUS
  Filled 2021-04-01: qty 2

## 2021-04-01 MED ORDER — SODIUM CHLORIDE 0.9 % IV BOLUS
1000.0000 mL | Freq: Once | INTRAVENOUS | Status: AC
  Administered 2021-04-01: 1000 mL via INTRAVENOUS

## 2021-04-01 NOTE — ED Notes (Signed)
Introduced myself to patient. She stated she has not eaten ina few days and does not have any food. Patient states she has been sick from breathing toxic fumes from her apartment. States she has a cough with N/V.

## 2021-04-01 NOTE — ED Notes (Signed)
Patient given refreshments to eat.  

## 2021-04-01 NOTE — Discharge Instructions (Signed)
Return to the emergency department if you experience any new and/or concerning symptoms. 

## 2021-04-01 NOTE — ED Provider Notes (Signed)
[facility]-EMERGENCY DEPT Provider Note   CSN: [id number] Arrival date & time: 03/31/21  2204     History Chief Complaint  Patient presents with  . Cough    DEOSHA WERDEN is a 45 y.o. female.  Patient is a 45 year old female with history of schizoaffective disorder.  She presents today for evaluation of possible exposure to toxic fumes.  Patient states that she has been having fumes coming out of her HVAC system in her apartment for several days.  She reports feeling nauseated and lightheaded.  She reports gagging and coughing.  She denies difficulty breathing or chest pain.  She denies fevers or chills.  The history is provided by the patient.       Past Medical History:  Diagnosis Date  . Schizophrenia [facility])     Patient Active Problem List   Diagnosis Date Noted  . Schizophrenia (HCC) 10/10/2020  . Schizoaffective disorder (HCC) 03/31/2019    Past Surgical History:  Procedure Laterality Date  . INDUCED ABORTION       OB History   No obstetric history on file.     Family History  Problem Relation Age of Onset  . Cancer Father     Social History   Tobacco Use  . Smoking status: Never Smoker  . Smokeless tobacco: Never Used  Vaping Use  . Vaping Use: Former  Substance Use Topics  . Alcohol use: Never  . Drug use: Never    Home Medications Prior to Admission medications   Not on File    Allergies    Haldol [haloperidol lactate], Penicillins, and Penicillins  Review of Systems   Review of Systems  All other systems reviewed and are negative.   Physical Exam Updated Vital Signs BP 112/81 (BP Location: Left Arm)   Pulse 78   Temp 98.7 F (37.1 C) (Oral)   Resp 17   Ht 5\' 1"  (1.549 m)   Wt 68 kg   LMP 03/28/2021   SpO2 100%   BMI 28.34 kg/m   Physical Exam Vitals and nursing note reviewed.  Constitutional:      General: She is not in acute distress.    Appearance: She is well-developed. She is not  diaphoretic.  HENT:     Head: Normocephalic and atraumatic.  Cardiovascular:     Rate and Rhythm: Normal rate and regular rhythm.     Heart sounds: No murmur heard. No friction rub. No gallop.   Pulmonary:     Effort: Pulmonary effort is normal. No respiratory distress.     Breath sounds: Normal breath sounds. No wheezing.  Abdominal:     General: Bowel sounds are normal. There is no distension.     Palpations: Abdomen is soft.     Tenderness: There is no abdominal tenderness.  Musculoskeletal:        General: Normal range of motion.     Cervical back: Normal range of motion and neck supple.  Skin:    General: Skin is warm and dry.  Neurological:     Mental Status: She is alert and oriented to person, place, and time.     ED Results / Procedures / Treatments   Labs (all labs ordered are listed, but only abnormal results are displayed) Labs Reviewed  COMPREHENSIVE METABOLIC PANEL  CBC WITH DIFFERENTIAL/PLATELET  CARBOXYHEMOGLOBIN - COOX    EKG None  Radiology No results found.  Procedures Procedures   Medications Ordered in ED Medications  sodium chloride 0.9 %  bolus 1,000 mL (has no administration in time range)  ondansetron (ZOFRAN) injection 4 mg (has no administration in time range)    ED Course  I have reviewed the triage vital signs and the nursing notes.  Pertinent labs & imaging results that were available during my care of the patient were reviewed by me and considered in my medical decision making (see chart for details).    MDM Rules/Calculators/A&P  Patient presenting here over concerns of being exposed to fumes coming out of her HVAC system in her apartment.  Patient has been observed here for several hours and vitals have remained stable with normal oxygen saturations.  Carboxyhemoglobin level is normal and laboratory studies are otherwise unremarkable.  Patient seems appropriate for discharge with as needed return.  Final Clinical Impression(s)  / ED Diagnoses Final diagnoses:  None    Rx / DC Orders ED Discharge Orders    None       Geoffery Lyons, MD 04/01/21 [phone]

## 2021-05-01 ENCOUNTER — Encounter (HOSPITAL_COMMUNITY): Payer: Self-pay | Admitting: Emergency Medicine

## 2021-05-01 ENCOUNTER — Other Ambulatory Visit: Payer: Self-pay

## 2021-05-01 ENCOUNTER — Inpatient Hospital Stay (HOSPITAL_COMMUNITY)
Admission: EM | Admit: 2021-05-01 | Discharge: 2021-05-05 | DRG: 641 | Disposition: A | Discharge status: Home / Self Care | Payer: Self-pay | Attending: Internal Medicine | Admitting: Internal Medicine

## 2021-05-01 ENCOUNTER — Emergency Department (HOSPITAL_COMMUNITY): Payer: Self-pay

## 2021-05-01 DIAGNOSIS — X30XXXA Exposure to excessive natural heat, initial encounter: Secondary | ICD-10-CM

## 2021-05-01 DIAGNOSIS — Z880 Allergy status to penicillin: Secondary | ICD-10-CM

## 2021-05-01 DIAGNOSIS — Z885 Allergy status to narcotic agent status: Secondary | ICD-10-CM

## 2021-05-01 DIAGNOSIS — Z5900 Homelessness unspecified: Secondary | ICD-10-CM

## 2021-05-01 DIAGNOSIS — T675XXA Heat exhaustion, unspecified, initial encounter: Secondary | ICD-10-CM | POA: Diagnosis present

## 2021-05-01 DIAGNOSIS — E860 Dehydration: Principal | ICD-10-CM | POA: Diagnosis present

## 2021-05-01 DIAGNOSIS — R739 Hyperglycemia, unspecified: Secondary | ICD-10-CM | POA: Diagnosis present

## 2021-05-01 DIAGNOSIS — R7303 Prediabetes: Secondary | ICD-10-CM | POA: Diagnosis present

## 2021-05-01 DIAGNOSIS — G44209 Tension-type headache, unspecified, not intractable: Secondary | ICD-10-CM

## 2021-05-01 DIAGNOSIS — N179 Acute kidney failure, unspecified: Secondary | ICD-10-CM | POA: Diagnosis present

## 2021-05-01 DIAGNOSIS — N19 Unspecified kidney failure: Secondary | ICD-10-CM | POA: Diagnosis present

## 2021-05-01 DIAGNOSIS — R821 Myoglobinuria: Secondary | ICD-10-CM | POA: Diagnosis present

## 2021-05-01 DIAGNOSIS — R112 Nausea with vomiting, unspecified: Secondary | ICD-10-CM | POA: Diagnosis present

## 2021-05-01 DIAGNOSIS — R519 Headache, unspecified: Secondary | ICD-10-CM | POA: Diagnosis present

## 2021-05-01 DIAGNOSIS — Z20822 Contact with and (suspected) exposure to covid-19: Secondary | ICD-10-CM | POA: Diagnosis present

## 2021-05-01 LAB — CBC WITH DIFFERENTIAL/PLATELET
Abs Immature Granulocytes: 0.05 10*3/uL (ref 0.00–0.07)
Basophils Absolute: 0 10*3/uL (ref 0.0–0.1)
Basophils Relative: 0 %
Eosinophils Absolute: 0 10*3/uL (ref 0.0–0.5)
Eosinophils Relative: 0 %
HCT: 43.6 % (ref 36.0–46.0)
Hemoglobin: 13.3 g/dL (ref 12.0–15.0)
Immature Granulocytes: 1 %
Lymphocytes Relative: 11 %
Lymphs Abs: 0.9 10*3/uL (ref 0.7–4.0)
MCH: 26 pg (ref 26.0–34.0)
MCHC: 30.5 g/dL (ref 30.0–36.0)
MCV: 85.3 fL (ref 80.0–100.0)
Monocytes Absolute: 0.5 10*3/uL (ref 0.1–1.0)
Monocytes Relative: 7 %
Neutro Abs: 6.3 10*3/uL (ref 1.7–7.7)
Neutrophils Relative %: 81 %
Platelets: 203 10*3/uL (ref 150–400)
RBC: 5.11 MIL/uL (ref 3.87–5.11)
RDW: 16.1 % — ABNORMAL HIGH (ref 11.5–15.5)
WBC: 7.7 10*3/uL (ref 4.0–10.5)
nRBC: 0 % (ref 0.0–0.2)

## 2021-05-01 LAB — ACETAMINOPHEN LEVEL: Acetaminophen (Tylenol), Serum: <10 ug/mL — ABNORMAL LOW (ref 10–30)

## 2021-05-01 LAB — ETHANOL: Alcohol, Ethyl (B): <10 mg/dL (ref <10)

## 2021-05-01 LAB — URINALYSIS, ROUTINE W REFLEX MICROSCOPIC
Bilirubin Urine: NEGATIVE
Glucose, UA: 50 mg/dL — AB
Ketones, ur: 20 mg/dL — AB
Nitrite: NEGATIVE
Protein, ur: 30 mg/dL — AB
Specific Gravity, Urine: 1.019 (ref 1.005–1.030)
pH: 5 (ref 5.0–8.0)

## 2021-05-01 LAB — COMPREHENSIVE METABOLIC PANEL
ALT: 29 U/L (ref 0–44)
AST: 30 U/L (ref 15–41)
Albumin: 3.9 g/dL (ref 3.5–5.0)
Alkaline Phosphatase: 69 U/L (ref 38–126)
Anion gap: 17 — ABNORMAL HIGH (ref 5–15)
BUN: 85 mg/dL — ABNORMAL HIGH (ref 6–20)
CO2: 24 mmol/L (ref 22–32)
Calcium: 8.9 mg/dL (ref 8.9–10.3)
Chloride: 103 mmol/L (ref 98–111)
Creatinine, Ser: 2.87 mg/dL — ABNORMAL HIGH (ref 0.44–1.00)
GFR, Estimated: 20 mL/min — ABNORMAL LOW (ref >60)
Glucose, Bld: 154 mg/dL — ABNORMAL HIGH (ref 70–99)
Potassium: 3.9 mmol/L (ref 3.5–5.1)
Sodium: 144 mmol/L (ref 135–145)
Total Bilirubin: 0.8 mg/dL (ref 0.3–1.2)
Total Protein: 7.9 g/dL (ref 6.5–8.1)

## 2021-05-01 LAB — I-STAT BETA HCG BLOOD, ED (MC, WL, AP ONLY): I-stat hCG, quantitative: <5 m[IU]/mL (ref <5)

## 2021-05-01 LAB — RESP PANEL BY RT-PCR (FLU A&B, COVID) ARPGX2
Influenza A by PCR: NEGATIVE
Influenza B by PCR: NEGATIVE
SARS Coronavirus 2 by RT PCR: NEGATIVE

## 2021-05-01 LAB — SALICYLATE LEVEL: Salicylate Lvl: <7 mg/dL — ABNORMAL LOW (ref 7.0–30.0)

## 2021-05-01 LAB — CK: Total CK: 38 U/L (ref 38–234)

## 2021-05-01 MED ORDER — SODIUM CHLORIDE 0.9 % IV BOLUS
1000.0000 mL | Freq: Once | INTRAVENOUS | Status: AC
  Administered 2021-05-01: 1000 mL via INTRAVENOUS

## 2021-05-01 MED ORDER — ACETAMINOPHEN 325 MG PO TABS: 650.0000 mg | ORAL_TABLET | Freq: Four times a day (QID) | ORAL | Status: DC | PRN

## 2021-05-01 MED ORDER — ACETAMINOPHEN 650 MG RE SUPP: 650.0000 mg | Freq: Four times a day (QID) | RECTAL | Status: DC | PRN

## 2021-05-01 MED ORDER — SODIUM CHLORIDE 0.9 % IV SOLN: Freq: Once | INTRAVENOUS | Status: DC

## 2021-05-01 MED ORDER — LACTATED RINGERS IV SOLN: INTRAVENOUS | Status: DC

## 2021-05-01 MED ORDER — ONDANSETRON HCL 4 MG/2ML IJ SOLN
4.0000 mg | Freq: Once | INTRAMUSCULAR | Status: AC
  Administered 2021-05-01: 4 mg via INTRAVENOUS
  Filled 2021-05-01: qty 2

## 2021-05-01 MED ORDER — ONDANSETRON HCL 4 MG/2ML IJ SOLN: 4.0000 mg | Freq: Four times a day (QID) | INTRAMUSCULAR | Status: DC | PRN

## 2021-05-01 MED ORDER — SODIUM CHLORIDE 0.9 % IV BOLUS
1000.0000 mL | Freq: Once | INTRAVENOUS | Status: AC
  Administered 2021-05-01: 18:00:00 1000 mL via INTRAVENOUS

## 2021-05-01 MED ORDER — LORAZEPAM 2 MG/ML IJ SOLN
0.5000 mg | INTRAMUSCULAR | Status: DC | PRN
  Administered 2021-05-01: 0.5 mg via INTRAVENOUS
  Filled 2021-05-01: qty 1

## 2021-05-01 NOTE — ED Triage Notes (Signed)
Patient is homeless- arrives via EMS.  Reported that the patient was found lying on the side of the road.  The patient reports that she had a headache and decided to rest on the side of the road.  The patient has not taken anything for her headache

## 2021-05-01 NOTE — ED Notes (Signed)
Patient refusing CT, states "It is scary and I have already been ruled out for a stroke so I do not need it"

## 2021-05-01 NOTE — ED Notes (Signed)
Phlebotomy contacted to obtain labs.

## 2021-05-01 NOTE — Progress Notes (Addendum)
Brief note regarding preliminary plan, with full H&P to follow:  45 year old female who is admitted with acute kidney injury in the setting of dehydration after spending significant time outside earlier today in the hot sun wearing multiple layers of clothing, including sweatshirts in order to protect these items in the setting of being homeless.  She had an initial headache in the absence of acute focal neurologic deficits. Headache has subsequently resolved with IV fluids.  She is also experienced some nausea/vomiting. I ordered prn IV Ativan for nausea/vomiting as well as as needed IV Zofran as needed Ativan.  We will continue with additional IV fluids closely monitor ensuing renal function add on urine electrolytes is further evaluation of acute kidney injury, although at this time, it appears that underlying etiology is prerenal in the setting of dehydration.  CPK without significant elevation. No overt evidence of heat stroke at this time.  She is nontachycardic, and temperature max has been 97.6 in the ED.     Newton Pigg, DO Hospitalist

## 2021-05-01 NOTE — H&P (Signed)
History and Physical    PLEASE NOTE THAT DRAGON DICTATION SOFTWARE WAS USED IN THE CONSTRUCTION OF THIS NOTE.   Stephanie [name] DOB: 05-14-1976 DOA: 05/01/2021  PCP: Patient, No Pcp Per (Inactive) Patient coming from: home   I have personally briefly reviewed patient's old medical records in [facility]  Chief Complaint: Headache  HPI: Stephanie [name] is a 45 y.o. female with history significant for schizophrenia, homelessness, who is admitted to [facility] on 05/01/2021 with acute kidney injury in the setting of dehydration after presenting  to [facility] ED complaining of headache.   In the setting of being homeless, the patient reports that she laid down for a nap outside earlier today in the middle of the afternoon, with temperatures near their daily high.  She also notes that she was wearing layers of clothing, including multiple sweatshirts in order to prevent worsening of her clothing while she was asleep.  She notes that she awoke a few hours later with new onset sharp bilateral frontal headache in absence of any visual or olfactory aura.  She reports associated nausea resulting in, nonbilious emesis.  Not associate with any preceding trauma.  Denies any recent alcohol consumption or use of recreational drugs.  Neck stiffness, rhinitis, rhinorrhea, sore throat, shortness of breath, wheezing, cough, diarrhea, or rash.  No recent traveling or known COVID-19 exposures.  She denies any recent dysuria, gross materia, or change in urinary urgency/frequency.  She does not recent chest pain, diaphoresis, palpitations.  In the setting of the above headache as well as nausea/vomiting, she was subsequently brought via EMS to [facility] long emergency department for further evaluation management of the above, receiving 500 cc of IV fluid via EMS in route.    ED Course:  Vital signs in the ED were notable for the following: Temperature max 97.8, heart rate 68-74; blood pressure  111/75 -144/84; respiratory rate 14-23, oxygen saturation 97 to 100% on room air.  Labs were notable for the following: CMP was notable for the following: Sodium 144, potassium 3.9, bicarbonate 24, BUN 18 compared to most recent prior value of 18 on 04/01/2021, creatinine 2.87 relative to most recent prior value of 1.19 on 04/01/2021, BUN to creatinine ratio 30, glucose 154, calcium 8.9, albumin 3.9, and additional liver enzymes were found to be within normal limits.  Total CPK 38.  CBC notable for white blood cell count of 7700.  Urinalysis was notable for the following: 6-10 white blood cells, rare bacteria, nitrate negative, 11-20 squamous epithelial cells, was positive for hyaline casts, specific gravity 1.019, 30 protein, and 0-5 RBCs.  Quantitative hCG found to be less than 5.  Nasopharyngeal COVID-19/influenza PCR was performed in the ED today and found to be negative.  Renal ultrasound was performed in the ED this evening, and was notable for increased echotexture associate with bilateral kidneys compatible with chronic medical renal disease in the absence of any evidence of postrenal obstruction.  While in the ED, the following were administered: Zofran 4 mg IV x2, normal saline x2 L bolus followed by continuous normal saline at 100 cc/h.  Subsequently, the patient was admitted to the med telemetry floor for further evaluation and management of presenting acute kidney injury in the setting of dehydration and ongoing nausea/vomiting.     Review of Systems: As per HPI otherwise 10 point review of systems negative.   Past Medical History:  Diagnosis Date   Schizophrenia [facility])     Past Surgical History:  Procedure Laterality Date   INDUCED ABORTION      Social History:  reports that she has never smoked. She has never used smokeless tobacco. She reports that she does not drink alcohol and does not use drugs.   Allergies  Allergen Reactions   Haldol [Haloperidol Lactate] Anaphylaxis    Penicillins Nausea And Vomiting    Did it involve swelling of the face/tongue/throat, SOB, or low BP? No Did it involve sudden or severe rash/hives, skin peeling, or any reaction on the inside of your mouth or nose? No Did you need to seek medical attention at a hospital or doctor's office? No When did it last happen?      unknown  If all above answers are "NO", may proceed with cephalosporin use.    Penicillins Nausea And Vomiting    Family History  Problem Relation Age of Onset   Cancer Father     Family history reviewed and not pertinent    Prior to Admission medications   Not on File  (The patient denies taking any scheduled, as needed, or over-the-counter medications as an outpatient)   Objective    Physical Exam: Vitals:   05/01/21 2045 05/01/21 2145 05/01/21 2214 05/01/21 2219  BP: 124/81 (!) 144/84  (!) 144/76  Pulse: 80 78  87  Resp: 14 17  (!) 25  Temp:      TempSrc:      SpO2: 100% 97% 98% 98%  Weight:      Height:        General: appears to be stated age; alert, oriented Skin: warm, dry, no rash Head:  AT/[place] Mouth:  Oral mucosa membranes appear dry, normal dentition Neck: supple; trachea midline Heart:  RRR; did not appreciate any M/R/G Lungs: CTAB, did not appreciate any wheezes, rales, or rhonchi Abdomen: + BS; soft, ND, NT Vascular: 2+ pedal pulses b/l; 2+ radial pulses b/l Extremities: no peripheral edema, no muscle wasting Neuro: strength and sensation intact in upper and lower extremities b/l    Labs on Admission: I have personally reviewed following labs and imaging studies  CBC: Recent Labs  Lab 05/01/21 2117  WBC 7.7  NEUTROABS 6.3  HGB 13.3  HCT 43.6  MCV 85.3  PLT 203   Basic Metabolic Panel: Recent Labs  Lab 05/01/21 2117  NA 144  K 3.9  CL 103  CO2 24  GLUCOSE 154*  BUN 85*  CREATININE 2.87*  CALCIUM 8.9   GFR: Estimated Creatinine Clearance: 20.3 mL/min (A) (by C-G formula based on SCr of 2.87 mg/dL (H)). Liver  Function Tests: Recent Labs  Lab 05/01/21 2117  AST 30  ALT 29  ALKPHOS 69  BILITOT 0.8  PROT 7.9  ALBUMIN 3.9   No results for input(s): LIPASE, AMYLASE in the last 168 hours. No results for input(s): AMMONIA in the last 168 hours. Coagulation Profile: No results for input(s): INR, PROTIME in the last 168 hours. Cardiac Enzymes: Recent Labs  Lab 05/01/21 2117  CKTOTAL 38   BNP (last 3 results) No results for input(s): PROBNP in the last 8760 hours. HbA1C: No results for input(s): HGBA1C in the last 72 hours. CBG: No results for input(s): GLUCAP in the last 168 hours. Lipid Profile: No results for input(s): CHOL, HDL, LDLCALC, TRIG, CHOLHDL, LDLDIRECT in the last 72 hours. Thyroid Function Tests: No results for input(s): TSH, T4TOTAL, FREET4, T3FREE, THYROIDAB in the last 72 hours. Anemia Panel: No results for input(s): VITAMINB12, FOLATE, FERRITIN, TIBC, IRON, RETICCTPCT in the  last 72 hours. Urine analysis:    Component Value Date/Time   COLORURINE YELLOW 05/01/2021 2220   APPEARANCEUR CLOUDY (A) 05/01/2021 2220   LABSPEC 1.019 05/01/2021 2220   PHURINE 5.0 05/01/2021 2220   GLUCOSEU 50 (A) 05/01/2021 2220   HGBUR SMALL (A) 05/01/2021 2220   BILIRUBINUR NEGATIVE 05/01/2021 2220   KETONESUR 20 (A) 05/01/2021 2220   PROTEINUR 30 (A) 05/01/2021 2220   NITRITE NEGATIVE 05/01/2021 2220   LEUKOCYTESUR MODERATE (A) 05/01/2021 2220    Radiological Exams on Admission: US Renal  Result Date: 05/01/2021 CLINICAL DATA:  Acute renal failure EXAM: RENAL / URINARY TRACT ULTRASOUND COMPLETE COMPARISON:  None. FINDINGS: Right Kidney: Renal measurements: 9.1 x 4.2 x 5.0 cm = volume: 96 mL. Increased echotexture. No mass or hydronephrosis. Left Kidney: Renal measurements: 9.1 x 4.4 x 4.1 cm = volume: 86 mL. Increased echotexture. No mass or hydronephrosis. Bladder: Appears normal for degree of bladder distention. Other: None. IMPRESSION: Increased echotexture compatible chronic  medical renal disease. No hydronephrosis. Electronically Signed   By: Charlett NoseKevin  Dover M.D.   On: 05/01/2021 22:54      Assessment/Plan   Stephanie [name] is a 45 y.o. female with history significant for schizophrenia, homelessness, who is [facility] on 05/01/2021 with acute kidney injury in the setting of dehydration after presenting  [facility] ED complaining of headache.    Principal Problem:   AKI (acute kidney injury) (HCC) Active Problems:   Acute prerenal azotemia   Dehydration   Nausea & vomiting   Headache      #) Acute kidney injury: Presenting serum creatinine found to be 2.87 relative to most recent prior value of 1.19 on 04/01/2021.  This appears to be prerenal in nature in setting of dehydration due to ostensible fluid losses as a consequence of sleeping outside in the hot summer temperatures during the mid afternoon while wearing clothing, including multiple sweatshirts, without interval p.o. water resuscitation, with ensuing further exacerbation due to multiple episodes of nausea/vomiting.  The possibility of rhabdomyolysis was considered, however her total CPK nonelevated, while urinalysis is consistent with myoglobinuria.  Presenting urinalysis with microscopy demonstrates the presence of hyaline cast, consistent with suspected element of dehydration, while acute prerenal azotemia with BUN to creatinine ratio 30 consistent with the above suspected mechanism of pathology.  It appears that the patient's baseline creatinine is between 1.0-1.2.  Renal ultrasound performed this evening was suggestive of chronic renal disease bilaterally, without evidence of postrenal obstruction.  We will continue IV fluids, and symptomatic management of nausea/vomiting, and repeat BMP in the morning, while adding on interval urine electrolytes to further evaluate presenting AKI.  Plan: Change normal saline to lactated Ringer's to be run continuously at 125 cc/h.  Add on random urine  sodium as well as random urine creatinine.  Monitor strict I's and O's and daily weights.  Tempt avoid nephrotoxic agents.  Repeat BMP in the morning.      #) Dehydration: Clinical and physical exam evidence to suggest presenting dehydration, as further noted above, including the presence of dry oral mucosa membranes on physical exam as well as evidence of prerenal azotemia, as above.  Not associated with any hypotension.  Additionally, overt evidence to suggest Sun stroke at this time we will closely monitor ensuing medicines, including body temperature to evaluate for interval development of such, which would temperature lowering interventions, namely evaporative heat loss techniques.   Plan: IV fluids, as above.  Monitor strict I's and O's  and daily weights.  Further evaluation management of presenting acute kidney injury.  Work-up and management of presenting nausea/vomiting, as further detailed below.  Repeat CMP in the morning.  Add on serum magnesium level.     #) Nausea/vomiting: At least 4-5 episodes of nonbloody, nonbilious emesis over the hours in the setting of prolonged sun exposure, and dehydration, as further detailed above, further contributing to presenting AKI, as above.  No evidence of associated hypotension.  Of note, quantitative hCG was found to be nonelevated.  Plan: As needed IV Ativan.  As needed IV Zofran for nausea/vomiting refractory to as needed Ativan.  Add on serum magnesium level.  Repeat CMP in the morning.  Add on urinary drug screen.  Check EKG.       #) Headache: bilateral frontal headache without associated or during dizziness consistent with tension headache.  No clinical or physical exam evidence to suggest meningitis, including no evidence of meningismus signs.  Presentation not associate with acute focal neurologic deficits, and therefore acute ischemic CVA appears less likely.  No preceding trauma.  Furthermore, presenting headache is subsequently  resolved with IV fluid resuscitation efforts, providing further support suspected contribution from dehydration.  Plan: Continue IV fluids, as above.  As needed acetaminophen.  Repeat CBC in the morning.     DVT prophylaxis: SCDs Code Status: Full code Family Communication: none Disposition Plan: Per Rounding Team Consults called: none  Admission status: Inpatient; med telemetry     Of note, this patient was added by me to the following Admit List/Treatment Team: [email or web address].      PLEASE NOTE THAT DRAGON DICTATION SOFTWARE WAS USED IN THE CONSTRUCTION OF THIS NOTE.   Angie Fava DO [facility] Pager [phone] From 6PM - 2AM  Otherwise, please contact night-coverage  [email or web address] Password [facility]   05/01/2021, 11:31 PM

## 2021-05-01 NOTE — ED Provider Notes (Signed)
[facility]-EMERGENCY DEPT Provider Note   CSN: [id number] Arrival date & time: 05/01/21  1702     History Chief Complaint  Patient presents with   Headache    Stephanie [name] is a 45 y.o. female with past medical history significant for schizophrenia who presents for evaluation of dehydration.  States she had headache earlier today.  She is homeless therefore laid on the sidewalk on the side of the road to take a nap to resolved her HA.  Patient denies any sudden onset headache.  States her headache is mostly resolved.  Denies recent trauma. States when she was laying down taking a nap she felt hot and overheated.  On arrival patient appears very disheveled, in long pants, socks as well as sweatshirt.  EMS called out by bystandard as she was sleeping on sidewalk. States she has not eaten much since she is homeless.  Has not taken anything for her headache. Denies SI, HI, AVH.  States she feels nauseous however has not vomited.  No fever, chills, chest pain, shortness of breath, blurred vision, paresthesias, weakness, Abd pain, dysuria.  She denies chance of pregnancy.  Rates her current pain a 2/10.  Denies additional aggravating or relieving factor.  History obtained from patient and past medical records.  No interpreter used.  HPI     Past Medical History:  Diagnosis Date   Schizophrenia [facility])     Patient Active Problem List   Diagnosis Date Noted   AKI (acute kidney injury) (HCC) 05/01/2021   Schizophrenia (HCC) 10/10/2020   Schizoaffective disorder (HCC) 03/31/2019    Past Surgical History:  Procedure Laterality Date   INDUCED ABORTION       OB History   No obstetric history on file.     Family History  Problem Relation Age of Onset   Cancer Father     Social History   Tobacco Use   Smoking status: Never   Smokeless tobacco: Never  Vaping Use   Vaping Use: Former  Substance Use Topics   Alcohol use: Never   Drug use: Never    Home  Medications Prior to Admission medications   Not on File    Allergies    Haldol [haloperidol lactate], Penicillins, and Penicillins  Review of Systems   Review of Systems  Constitutional: Negative.   HENT: Negative.    Respiratory: Negative.    Cardiovascular: Negative.   Gastrointestinal:  Positive for nausea. Negative for abdominal distention, abdominal pain, constipation, diarrhea and vomiting.  Genitourinary: Negative.   Musculoskeletal: Negative.   Skin: Negative.   Neurological:  Positive for headaches. Negative for dizziness, tremors, seizures, syncope, speech difficulty, weakness, light-headedness and numbness.  Psychiatric/Behavioral: Negative.    All other systems reviewed and are negative.  Physical Exam Updated Vital Signs BP (!) 144/76   Pulse 87   Temp 97.6 F (36.4 C) (Oral)   Resp (!) 25   Ht 4\' 11"  (1.499 m)   Wt 68 kg   SpO2 98%   BMI 30.30 kg/m   Physical Exam Vitals and nursing note reviewed.  Constitutional:      General: She is not in acute distress.    Appearance: She is not ill-appearing or toxic-appearing.     Comments: Disheveled, malodorous, dirty clothing  HENT:     Head: Normocephalic and atraumatic.     Comments: No obvious deformities, palpable defects of skull.    Mouth/Throat:     Lips: Pink.     Mouth:  Mucous membranes are dry.     Pharynx: Oropharynx is clear.     Comments: Mucous membranes very dry.  Tongue midline. Eyes:     Pupils: Pupils are equal, round, and reactive to light.     Comments: PERRLA  Neck:     Comments: Full range of motion without difficulty Cardiovascular:     Rate and Rhythm: Normal rate.     Pulses: Normal pulses.          Radial pulses are 2+ on the right side and 2+ on the left side.       Dorsalis pedis pulses are 2+ on the right side and 2+ on the left side.     Heart sounds: Normal heart sounds.  Pulmonary:     Effort: Pulmonary effort is normal. No respiratory distress.     Breath sounds:  Normal breath sounds and air entry.     Comments: Clear to auscultation bilaterally.  Speaks in full sentences without difficulty. Chest:     Comments: Equal rise and fall to chest wall Abdominal:     General: Bowel sounds are normal. There is no distension.     Palpations: Abdomen is soft.     Tenderness: There is no abdominal tenderness. There is no guarding or rebound.     Comments: Normoactive bowel sounds.  Nontender  Musculoskeletal:        General: Normal range of motion.     Cervical back: Normal range of motion.     Comments: No bony tenderness.  Moves all 4 extremities without difficulty  Skin:    General: Skin is warm and dry.     Capillary Refill: Capillary refill takes less than 2 seconds.     Comments: No obvious traumatic injuries  Neurological:     General: No focal deficit present.     Mental Status: She is alert.     Cranial Nerves: Cranial nerves are intact.     Motor: Motor function is intact.     Comments: Cranial nerves II 12 grossly intact  Psychiatric:        Mood and Affect: Mood normal.     Comments: Flat affect Denies SI, HI, AVH    ED Results / Procedures / Treatments   Labs (all labs ordered are listed, but only abnormal results are displayed) Labs Reviewed  CBC WITH DIFFERENTIAL/PLATELET - Abnormal; Notable for the following components:      Result Value   RDW 16.1 (*)    All other components within normal limits  COMPREHENSIVE METABOLIC PANEL - Abnormal; Notable for the following components:   Glucose, Bld 154 (*)    BUN 85 (*)    Creatinine, Ser 2.87 (*)    GFR, Estimated 20 (*)    Anion gap 17 (*)    All other components within normal limits  ACETAMINOPHEN LEVEL - Abnormal; Notable for the following components:   Acetaminophen (Tylenol), Serum <10 (*)    All other components within normal limits  SALICYLATE LEVEL - Abnormal; Notable for the following components:   Salicylate Lvl <7.0 (*)    All other components within normal limits   URINALYSIS, ROUTINE W REFLEX MICROSCOPIC - Abnormal; Notable for the following components:   APPearance CLOUDY (*)    Glucose, UA 50 (*)    Hgb urine dipstick SMALL (*)    Ketones, ur 20 (*)    Protein, ur 30 (*)    Leukocytes,Ua MODERATE (*)    Bacteria, UA RARE (*)  All other components within normal limits  RESP PANEL BY RT-PCR (FLU A&B, COVID) ARPGX2  ETHANOL  CK  I-STAT BETA HCG BLOOD, ED ([facility], [facility], [facility] ONLY)    EKG None  Radiology US Renal  Result Date: 05/01/2021 CLINICAL DATA:  Acute renal failure EXAM: RENAL / URINARY TRACT ULTRASOUND COMPLETE COMPARISON:  None. FINDINGS: Right Kidney: Renal measurements: 9.1 x 4.2 x 5.0 cm = volume: 96 mL. Increased echotexture. No mass or hydronephrosis. Left Kidney: Renal measurements: 9.1 x 4.4 x 4.1 cm = volume: 86 mL. Increased echotexture. No mass or hydronephrosis. Bladder: Appears normal for degree of bladder distention. Other: None. IMPRESSION: Increased echotexture compatible chronic medical renal disease. No hydronephrosis. Electronically Signed   By: Charlett Nose M.D.   On: 05/01/2021 22:54    Procedures Procedures   Medications Ordered in ED Medications  sodium chloride 0.9 % bolus 1,000 mL (has no administration in time range)  sodium chloride 0.9 % bolus 1,000 mL (0 mLs Intravenous Stopped 05/01/21 1917)  ondansetron (ZOFRAN) injection 4 mg (4 mg Intravenous Given 05/01/21 2223)  ondansetron (ZOFRAN) injection 4 mg (4 mg Intravenous Given 05/01/21 2324)   ED Course  I have reviewed the triage vital signs and the nursing notes.  Pertinent labs & imaging results that were available during my care of the patient were reviewed by me and considered in my medical decision making (see chart for details).  45 year old here for evaluation of dehydration with heat exposure.  She is afebrile, nonseptic, non-ill-appearing.  Patient has history of schizophrenia, homelessness.  Apparently had a headache earlier today which was  atraumatic denies any sudden onset thunderclap headache.  Apparently decided to lay on sidewalk to resolve her headache.  She was awoken by EMS as they were called out by by standards.  Patient states while she was sleeping she felt hot.  Her headache has improved.  She is a nonfocal neuro exam without deficits.  Her heart and lungs are clear.  Abdomen soft, nontender.  She does appear very dehydrated with very dry mucous membranes.  Does not appear actively psychotic.  We will plan on labs, imaging and reassess.  We will start IV fluids.  Labs and imaging personally reviewed and interpreted:  CT head>>>Declined CBC without leukocytosis CMP BUN 85, creatinine 2.87>> prior 1.1, anion gap 17 Acetaminophen WNL Salicylate WNL Ethanol negative UA moderate leuks, rare bacteria, dirty catch. Patient without urinary complaints CK 38  Patient reassessed.  Now having emesis.  Discussed labs with acute renal failure.  She denies any pain or is not been able to urinate.  We will have nursing Bladder Scan her, ultrasound renal however high suspicion for dehydration as cause of her lack of urine output.  Patient reassessed.  Has had continued emesis.  Refer additional dose of Zofran. Additional IVF ordered.  CONSULT with Dr. Arlean Hopping with [facility] who will evaluate patient for admission. Suspect patient ARF from dehydration for heat exposure.  The patient has been appropriately medically screened and/or stabilized in the ED. I have low suspicion for any other emergent medical condition which would require further screening, evaluation or treatment in the ED or require inpatient management.  Patient is hemodynamically stable and in no acute distress.  Patient able to ambulate in department prior to ED.  Evaluation does not show acute pathology that would require ongoing or additional emergent interventions while in the emergency department or further inpatient treatment.  I have discussed the diagnosis with the  patient and answered all  questions.  Pain is been managed while in the emergency department and patient has no further complaints prior to discharge.  Patient is comfortable with plan discussed in room and is stable for discharge at this time.  I have discussed strict return precautions for returning to the emergency department.  Patient was encouraged to follow-up with PCP/specialist refer to at discharge.     MDM Rules/Calculators/A&P                           Final Clinical Impression(s) / ED Diagnoses Final diagnoses:  Acute renal failure, unspecified acute renal failure type (HCC)  Dehydration  Nausea and vomiting, intractability of vomiting not specified, unspecified vomiting type    Rx / DC Orders ED Discharge Orders     None        Jencarlos Nicolson A, PA-C 05/01/21 2328    Melene Plan, DO 05/02/21 2128

## 2021-05-02 ENCOUNTER — Encounter (HOSPITAL_COMMUNITY): Payer: Self-pay | Admitting: Internal Medicine

## 2021-05-02 DIAGNOSIS — R112 Nausea with vomiting, unspecified: Secondary | ICD-10-CM | POA: Diagnosis present

## 2021-05-02 DIAGNOSIS — R519 Headache, unspecified: Secondary | ICD-10-CM | POA: Diagnosis present

## 2021-05-02 DIAGNOSIS — E860 Dehydration: Secondary | ICD-10-CM | POA: Diagnosis present

## 2021-05-02 DIAGNOSIS — N19 Unspecified kidney failure: Secondary | ICD-10-CM | POA: Diagnosis present

## 2021-05-02 LAB — RAPID URINE DRUG SCREEN, HOSP PERFORMED
Amphetamines: NOT DETECTED
Barbiturates: NOT DETECTED
Benzodiazepines: NOT DETECTED
Cocaine: NOT DETECTED
Opiates: NOT DETECTED
Tetrahydrocannabinol: NOT DETECTED

## 2021-05-02 LAB — COMPREHENSIVE METABOLIC PANEL
ALT: 22 U/L (ref 0–44)
AST: 28 U/L (ref 15–41)
Albumin: 3.3 g/dL — ABNORMAL LOW (ref 3.5–5.0)
Alkaline Phosphatase: 58 U/L (ref 38–126)
Anion gap: 12 (ref 5–15)
BUN: 69 mg/dL — ABNORMAL HIGH (ref 6–20)
CO2: 27 mmol/L (ref 22–32)
Calcium: 8.8 mg/dL — ABNORMAL LOW (ref 8.9–10.3)
Chloride: 105 mmol/L (ref 98–111)
Creatinine, Ser: 2.56 mg/dL — ABNORMAL HIGH (ref 0.44–1.00)
GFR, Estimated: 23 mL/min — ABNORMAL LOW (ref >60)
Glucose, Bld: 174 mg/dL — ABNORMAL HIGH (ref 70–99)
Potassium: 3.5 mmol/L (ref 3.5–5.1)
Sodium: 144 mmol/L (ref 135–145)
Total Bilirubin: 0.5 mg/dL (ref 0.3–1.2)
Total Protein: 6.4 g/dL — ABNORMAL LOW (ref 6.5–8.1)

## 2021-05-02 LAB — CBC
HCT: 37.5 % (ref 36.0–46.0)
Hemoglobin: 11.3 g/dL — ABNORMAL LOW (ref 12.0–15.0)
MCH: 25.9 pg — ABNORMAL LOW (ref 26.0–34.0)
MCHC: 30.1 g/dL (ref 30.0–36.0)
MCV: 86 fL (ref 80.0–100.0)
Platelets: 168 10*3/uL (ref 150–400)
RBC: 4.36 MIL/uL (ref 3.87–5.11)
RDW: 16.1 % — ABNORMAL HIGH (ref 11.5–15.5)
WBC: 6.4 10*3/uL (ref 4.0–10.5)
nRBC: 0 % (ref 0.0–0.2)

## 2021-05-02 LAB — CREATININE, URINE, RANDOM: Creatinine, Urine: 294.91 mg/dL

## 2021-05-02 LAB — HIV ANTIBODY (ROUTINE TESTING W REFLEX): HIV Screen 4th Generation wRfx: NONREACTIVE

## 2021-05-02 LAB — SODIUM, URINE, RANDOM: Sodium, Ur: <10 mmol/L

## 2021-05-02 LAB — MAGNESIUM
Magnesium: 2.7 mg/dL — ABNORMAL HIGH (ref 1.7–2.4)
Magnesium: 2.3 mg/dL (ref 1.7–2.4)

## 2021-05-02 MED ORDER — ENOXAPARIN SODIUM 30 MG/0.3ML IJ SOSY
30.0000 mg | PREFILLED_SYRINGE | INTRAMUSCULAR | Status: DC
  Administered 2021-05-02: 30 mg via SUBCUTANEOUS
  Filled 2021-05-02 (×2): qty 0.3

## 2021-05-02 MED ORDER — LACTATED RINGERS IV SOLN: INTRAVENOUS | Status: AC

## 2021-05-02 NOTE — Discharge Instructions (Signed)
Homeless [facility] [place]   [facility] [phone] Call for shelter availability.  You must have access to a phone so they can call you back.  [facility] [address] [place] [phone] M-F 8:00-3:00; S-S 8:00-2:00  [facility] (Men and Women) [address] [place] [phone]  [facility] (Men and Women) [address] [place] [phone]  [facility] ([facility]) [address] [place] [phone]  [facility] (Men) [address] [place] [phone]  [facility] (Single women and women with children) [address] [place] [phone]      

## 2021-05-02 NOTE — ED Notes (Signed)
Pt IV removed; Charge nurse replaced new [place] IV but unable to obtain blood. Phlebotomy called to come get blood work.

## 2021-05-02 NOTE — Plan of Care (Signed)

## 2021-05-02 NOTE — Progress Notes (Signed)
PROGRESS NOTE    Stephanie [name]  YWV:[id number] DOB: 1976/11/13 DOA: 05/01/2021 PCP: Patient, No Pcp Per (Inactive)  Brief Narrative: 45 year old female with schizophrenia, homelessness presented to the ED last night with weakness, dizziness and headache.,  She is homeless and laid on the sidewalk take a nap, after that felt extremely hot and overheated, he was wearing a sweatshirt, long socks, multiple layers of clothes in the heat, had not eaten much in a few days, also had some episodes of nausea and one episode of vomiting. -In the emergency room she was noted to have a creatinine of 2.8, elevated CPK, hyperglycemia,   Assessment & Plan:   Acute kidney injury Dehydration Heat exhaustion -Baseline creatinine is 1.1, creatinine on admission was 2.8 -Continue IV fluids today, improving down to 2.5 this morning, renal ultrasound without hydronephrosis -Monitor urine output and kidney function  Hyperglycemia -Check hemoglobin A1c  Nausea and vomiting -Likely secondary to heat exhaustion, supportive care, fluids  Homelessness -TOC consult   DVT prophylaxis: Lovenox Code Status: Full code Family Communication: No immediate family, homeless Disposition Plan:  Status is: Inpatient  Remains inpatient appropriate because:Inpatient level of care appropriate due to severity of illness  Dispo: The patient is from: Homeless              Anticipated d/c is to:  unknown              Patient currently is not medically stable to d/c.   Difficult to place patient No    Subjective: Complains of feeling weak and tired  Objective: Vitals:   05/02/21 0200 05/02/21 0400 05/02/21 0500 05/02/21 0800  BP: 116/73 (!) 142/93 (!) 106/59 110/77  Pulse: 70 (!) 59 75 66  Resp: 13 16 14 13   Temp:      TempSrc:      SpO2: 100% 99% 100% 98%  Weight:      Height:        Intake/Output Summary (Last 24 hours) at 05/02/2021 1055 Last data filed at 05/02/2021 0104 Gross per 24 hour  Intake  1903.57 ml  Output 100 ml  Net 1803.57 ml   Filed Weights   05/01/21 1727  Weight: 68 kg    Examination:  General exam: Disheveled chronically ill female, laying in bed, AAOx3, somnolent after pain medications  Respiratory system: Clear to auscultation Cardiovascular system: S1 & S2 heard, RRR.  Abd: nondistended, soft and nontender.Normal bowel sounds heard. Central nervous system: Alert and oriented. No focal neurological deficits. Extremities: no edema Skin: No rashes Psychiatry: Flat affect    Data Reviewed:   CBC: Recent Labs  Lab 05/01/21 2117 05/02/21 0903  WBC 7.7 6.4  NEUTROABS 6.3  --   HGB 13.3 11.3*  HCT 43.6 37.5  MCV 85.3 86.0  PLT 203 168   Basic Metabolic Panel: Recent Labs  Lab 05/01/21 2117 05/02/21 0903  NA 144 144  K 3.9 3.5  CL 103 105  CO2 24 27  GLUCOSE 154* 174*  BUN 85* 69*  CREATININE 2.87* 2.56*  CALCIUM 8.9 8.8*  MG 2.7* 2.3   GFR: Estimated Creatinine Clearance: 22.8 mL/min (A) (by C-G formula based on SCr of 2.56 mg/dL (H)). Liver Function Tests: Recent Labs  Lab 05/01/21 2117 05/02/21 0903  AST 30 28  ALT 29 22  ALKPHOS 69 58  BILITOT 0.8 0.5  PROT 7.9 6.4*  ALBUMIN 3.9 3.3*   No results for input(s): LIPASE, AMYLASE in the last 168 hours. No results for  input(s): AMMONIA in the last 168 hours. Coagulation Profile: No results for input(s): INR, PROTIME in the last 168 hours. Cardiac Enzymes: Recent Labs  Lab 05/01/21 2117  CKTOTAL 38   BNP (last 3 results) No results for input(s): PROBNP in the last 8760 hours. HbA1C: No results for input(s): HGBA1C in the last 72 hours. CBG: No results for input(s): GLUCAP in the last 168 hours. Lipid Profile: No results for input(s): CHOL, HDL, LDLCALC, TRIG, CHOLHDL, LDLDIRECT in the last 72 hours. Thyroid Function Tests: No results for input(s): TSH, T4TOTAL, FREET4, T3FREE, THYROIDAB in the last 72 hours. Anemia Panel: No results for input(s): VITAMINB12, FOLATE,  FERRITIN, TIBC, IRON, RETICCTPCT in the last 72 hours. Urine analysis:    Component Value Date/Time   COLORURINE YELLOW 05/01/2021 2220   APPEARANCEUR CLOUDY (A) 05/01/2021 2220   LABSPEC 1.019 05/01/2021 2220   PHURINE 5.0 05/01/2021 2220   GLUCOSEU 50 (A) 05/01/2021 2220   HGBUR SMALL (A) 05/01/2021 2220   BILIRUBINUR NEGATIVE 05/01/2021 2220   KETONESUR 20 (A) 05/01/2021 2220   PROTEINUR 30 (A) 05/01/2021 2220   NITRITE NEGATIVE 05/01/2021 2220   LEUKOCYTESUR MODERATE (A) 05/01/2021 2220   Sepsis Labs: @LABRCNTIP (procalcitonin:4,lacticidven:4)  ) Recent Results (from the past 240 hour(s))  Resp Panel by RT-PCR (Flu A&B, Covid) Nasopharyngeal Swab     Status: None   Collection Time: 05/01/21 10:20 PM   Specimen: Nasopharyngeal Swab; Nasopharyngeal(NP) swabs in vial transport medium  Result Value Ref Range Status   SARS Coronavirus 2 by RT PCR NEGATIVE NEGATIVE Final    Comment: (NOTE) SARS-CoV-2 target nucleic acids are NOT DETECTED.  The SARS-CoV-2 RNA is generally detectable in upper respiratory specimens during the acute phase of infection. The lowest concentration of SARS-CoV-2 viral copies this assay can detect is 138 copies/mL. A negative result does not preclude SARS-Cov-2 infection and should not be used as the sole basis for treatment or other patient management decisions. A negative result may occur with  improper specimen collection/handling, submission of specimen other than nasopharyngeal swab, presence of viral mutation(s) within the areas targeted by this assay, and inadequate number of viral copies(<138 copies/mL). A negative result must be combined with clinical observations, patient history, and epidemiological information. The expected result is Negative.  Fact Sheet for Patients:  07/01/21  Fact Sheet for Healthcare Providers:  BloggerCourse.com  This test is no t yet approved or cleared by  the [employer]ious[employer].[place] FDA and  has been authorized for detection and/or diagnosis of SARS-CoV-2 by FDA under an Emergency Use Authorization (EUA). This EUA will remain  in effect (meaning this test can be used) for the duration of the COVID-19 declaration under Section 564(b)(1) of the Act, 21 U.S.C.section 360bbb-3(b)(1), unless the authorization is terminated  or revoked sooner.       Influenza A by PCR NEGATIVE NEGATIVE Final   Influenza B by PCR NEGATIVE NEGATIVE Final    Comment: (NOTE) The Xpert Xpress SARS-CoV-2/FLU/RSV plus assay is intended as an aid in the diagnosis of influenza from Nasopharyngeal swab specimens and should not be used as a sole basis for treatment. Nasal washings and aspirates are unacceptable for Xpert Xpress SARS-CoV-2/FLU/RSV testing.  Fact Sheet for Patients: [place]  Fact Sheet for Healthcare Providers: BloggerCourse.com  This test is not yet approved or cleared by the SeriousBroker.it FDA and has been authorized for detection and/or diagnosis of SARS-CoV-2 by FDA under an Emergency Use Authorization (EUA). This EUA will remain in effect (meaning this test can be  used) for the duration of the COVID-19 declaration under Section 564(b)(1) of the Act, 21 U.S.C. section 360bbb-3(b)(1), unless the authorization is terminated or revoked.  Performed at [facility], [address], [place], [place] [address]          Radiology Studies: US Renal  Result Date: 05/01/2021 CLINICAL DATA:  Acute renal failure EXAM: RENAL / URINARY TRACT ULTRASOUND COMPLETE COMPARISON:  None. FINDINGS: Right Kidney: Renal measurements: 9.1 x 4.2 x 5.0 cm = volume: 96 mL. Increased echotexture. No mass or hydronephrosis. Left Kidney: Renal measurements: 9.1 x 4.4 x 4.1 cm = volume: 86 mL. Increased echotexture. No mass or hydronephrosis. Bladder: Appears normal for degree of bladder distention. Other:  None. IMPRESSION: Increased echotexture compatible chronic medical renal disease. No hydronephrosis. Electronically Signed   By: Charlett Nose M.D.   On: 05/01/2021 22:54        Scheduled Meds: Continuous Infusions:  lactated ringers 125 mL/hr at 05/02/21 0154     LOS: 1 day    Time spent:47min    Zannie Cove, MD [facility]   05/02/2021, 10:55 AM

## 2021-05-02 NOTE — ED Notes (Addendum)
Charge nurse attempted to use ultrasound to obtain blood for am labs, unsuccessful attempt.  MD aware, pt very difficult stick.  Phlebotomy to attempt.

## 2021-05-02 NOTE — ED Notes (Signed)
Phlebotomy stated that it would probably be an hour to an hour and a half before they could send someone to collect am labs.

## 2021-05-02 NOTE — Social Work (Addendum)
CSW consulted for homelessness issues.  CSW met with Pt at bedside.  Pt  endorses that she cannot eat or drink due to food and beverages "turning into alcohol" and causing her to vomit.  Pt states that she cannot go to [facility] for free meals because, "people there call me a whore."  Pt reports that she is still living in apartment listed in chart, however, Pt states that she does not use A/C due to malfunctions. Pt also states that there is no running water in apt.  CSW asked about discussions with landlord and Pt states that she does not know landlord's #.   Pt reports that rent is paid by [employer]. Pt states that her case managers names at [facility] are  Domingo Dimes and Winn Jock and gave verbal permission for writer to call and discuss issues with apartment with CMs.  CSW called [facility] main # as well as [employer] and [employer] in an attempt to locate either of these CMs, but was unable to reach either or to reach a voicemail. CSW provided Pt with [place] of free meals in [place].

## 2021-05-03 LAB — CBC
HCT: 33.3 % — ABNORMAL LOW (ref 36.0–46.0)
Hemoglobin: 9.9 g/dL — ABNORMAL LOW (ref 12.0–15.0)
MCH: 25.5 pg — ABNORMAL LOW (ref 26.0–34.0)
MCHC: 29.7 g/dL — ABNORMAL LOW (ref 30.0–36.0)
MCV: 85.8 fL (ref 80.0–100.0)
Platelets: 144 10*3/uL — ABNORMAL LOW (ref 150–400)
RBC: 3.88 MIL/uL (ref 3.87–5.11)
RDW: 16.2 % — ABNORMAL HIGH (ref 11.5–15.5)
WBC: 4.4 10*3/uL (ref 4.0–10.5)
nRBC: 0 % (ref 0.0–0.2)

## 2021-05-03 LAB — BASIC METABOLIC PANEL
Anion gap: 7 (ref 5–15)
BUN: 51 mg/dL — ABNORMAL HIGH (ref 6–20)
CO2: 26 mmol/L (ref 22–32)
Calcium: 8.4 mg/dL — ABNORMAL LOW (ref 8.9–10.3)
Chloride: 108 mmol/L (ref 98–111)
Creatinine, Ser: 2.02 mg/dL — ABNORMAL HIGH (ref 0.44–1.00)
GFR, Estimated: 30 mL/min — ABNORMAL LOW (ref >60)
Glucose, Bld: 151 mg/dL — ABNORMAL HIGH (ref 70–99)
Potassium: 3.4 mmol/L — ABNORMAL LOW (ref 3.5–5.1)
Sodium: 141 mmol/L (ref 135–145)

## 2021-05-03 MED ORDER — POTASSIUM CHLORIDE CRYS ER 20 MEQ PO TBCR
40.0000 meq | EXTENDED_RELEASE_TABLET | Freq: Once | ORAL | Status: DC
  Filled 2021-05-03: qty 2

## 2021-05-03 NOTE — Progress Notes (Signed)
PROGRESS NOTE    Stephanie [name]  UVO:[id number] DOB: June 19, 1976 DOA: 05/01/2021 PCP: Patient, No Pcp Per (Inactive)  Brief Narrative: 45 year old female with schizophrenia, ?homelessness presented to the ED last night with weakness, dizziness and headache.,  She is homeless and laid on the sidewalk take a nap, after that felt extremely hot and overheated, he was wearing a sweatshirt, long socks, multiple layers of clothes in the heat, had not eaten much in a few days, also had some episodes of nausea and one episode of vomiting. -In the emergency room she was noted to have a creatinine of 2.8, elevated CPK, hyperglycemia,   Assessment & Plan:   Acute kidney injury Dehydration Heat exhaustion -Baseline creatinine is 1.1, creatinine on admission was 2.8 -improving creatinine trending down to 2.0 this morning, renal ultrasound without hydronephrosis -Monitor urine output and kidney function closely  Hyperglycemia -Hemoglobin A1c is pending  Nausea and vomiting -Likely secondary to heat exhaustion, supportive care, fluids -Improved, advanced diet  Homelessness -TOC consult appreciated, apparently can go back to an apartment   DVT prophylaxis: Lovenox Code Status: Full code Family Communication: No immediate family, homeless Disposition Plan:  Status is: Inpatient  Remains inpatient appropriate because:Inpatient level of care appropriate due to severity of illness  Dispo: The patient is from: Home              Anticipated d/c is to:  home              Patient currently is not medically stable to d/c.   Difficult to place patient No  Subjective: Complains of feeling weak and tired  Objective: Vitals:   05/02/21 1738 05/02/21 2128 05/03/21 0148 05/03/21 0631  BP: (!) 108/54 102/61 114/63 97/64  Pulse: (!) 58 66 65 65  Resp: 20 17 18 19   Temp: 97.9 F (36.6 C) 98.1 F (36.7 C) 97.6 F (36.4 C) 97.7 F (36.5 C)  TempSrc: Oral Oral  Oral  SpO2: 100% 100% 100% 100%   Weight:      Height:        Intake/Output Summary (Last 24 hours) at 05/03/2021 1219 Last data filed at 05/03/2021 07/03/2021 Gross per 24 hour  Intake 1328.49 ml  Output --  Net 1328.49 ml   Filed Weights   05/01/21 1727  Weight: 68 kg    Examination:  General exam: disheveled, chronically ill female, sitting up in bed, AAOx3 CVS: S1S2/RRR Lungs: CTAB Abd: soft, ND, non distended, BS present Ext: edema Skin: No rashes Psychiatry: Flat affect  Data Reviewed:   CBC: Recent Labs  Lab 05/01/21 2117 05/02/21 0903 05/03/21 0442  WBC 7.7 6.4 4.4  NEUTROABS 6.3  --   --   HGB 13.3 11.3* 9.9*  HCT 43.6 37.5 33.3*  MCV 85.3 86.0 85.8  PLT 203 168 144*   Basic Metabolic Panel: Recent Labs  Lab 05/01/21 2117 05/02/21 0903 05/03/21 0442  NA 144 144 141  K 3.9 3.5 3.4*  CL 103 105 108  CO2 24 27 26   GLUCOSE 154* 174* 151*  BUN 85* 69* 51*  CREATININE 2.87* 2.56* 2.02*  CALCIUM 8.9 8.8* 8.4*  MG 2.7* 2.3  --    GFR: Estimated Creatinine Clearance: 28.9 mL/min (A) (by C-G formula based on SCr of 2.02 mg/dL (H)). Liver Function Tests: Recent Labs  Lab 05/01/21 2117 05/02/21 0903  AST 30 28  ALT 29 22  ALKPHOS 69 58  BILITOT 0.8 0.5  PROT 7.9 6.4*  ALBUMIN 3.9 3.3*  No results for input(s): LIPASE, AMYLASE in the last 168 hours. No results for input(s): AMMONIA in the last 168 hours. Coagulation Profile: No results for input(s): INR, PROTIME in the last 168 hours. Cardiac Enzymes: Recent Labs  Lab 05/01/21 2117  CKTOTAL 38   BNP (last 3 results) No results for input(s): PROBNP in the last 8760 hours. HbA1C: No results for input(s): HGBA1C in the last 72 hours. CBG: No results for input(s): GLUCAP in the last 168 hours. Lipid Profile: No results for input(s): CHOL, HDL, LDLCALC, TRIG, CHOLHDL, LDLDIRECT in the last 72 hours. Thyroid Function Tests: No results for input(s): TSH, T4TOTAL, FREET4, T3FREE, THYROIDAB in the last 72 hours. Anemia  Panel: No results for input(s): VITAMINB12, FOLATE, FERRITIN, TIBC, IRON, RETICCTPCT in the last 72 hours. Urine analysis:    Component Value Date/Time   COLORURINE YELLOW 05/01/2021 2220   APPEARANCEUR CLOUDY (A) 05/01/2021 2220   LABSPEC 1.019 05/01/2021 2220   PHURINE 5.0 05/01/2021 2220   GLUCOSEU 50 (A) 05/01/2021 2220   HGBUR SMALL (A) 05/01/2021 2220   BILIRUBINUR NEGATIVE 05/01/2021 2220   KETONESUR 20 (A) 05/01/2021 2220   PROTEINUR 30 (A) 05/01/2021 2220   NITRITE NEGATIVE 05/01/2021 2220   LEUKOCYTESUR MODERATE (A) 05/01/2021 2220   Sepsis Labs: @LABRCNTIP (procalcitonin:4,lacticidven:4)  ) Recent Results (from the past 240 hour(s))  Resp Panel by RT-PCR (Flu A&B, Covid) Nasopharyngeal Swab     Status: None   Collection Time: 05/01/21 10:20 PM   Specimen: Nasopharyngeal Swab; Nasopharyngeal(NP) swabs in vial transport medium  Result Value Ref Range Status   SARS Coronavirus 2 by RT PCR NEGATIVE NEGATIVE Final    Comment: (NOTE) SARS-CoV-2 target nucleic acids are NOT DETECTED.  The SARS-CoV-2 RNA is generally detectable in upper respiratory specimens during the acute phase of infection. The lowest concentration of SARS-CoV-2 viral copies this assay can detect is 138 copies/mL. A negative result does not preclude SARS-Cov-2 infection and should not be used as the sole basis for treatment or other patient management decisions. A negative result may occur with  improper specimen collection/handling, submission of specimen other than nasopharyngeal swab, presence of viral mutation(s) within the areas targeted by this assay, and inadequate number of viral copies(<138 copies/mL). A negative result must be combined with clinical observations, patient history, and epidemiological information. The expected result is Negative.  Fact Sheet for Patients:  07/01/21  Fact Sheet for Healthcare Providers:   BloggerCourse.com  This test is no t yet approved or cleared by the [employer] FDA and  has been authorized for detection and/or diagnosis of SARS-CoV-2 by FDA under an Emergency Use Authorization (EUA). This EUA will remain  in effect (meaning this test can be used) for the duration of the COVID-19 declaration under Section 564(b)(1) of the Act, 21 U.S.C.section 360bbb-3(b)(1), unless the authorization is terminated  or revoked sooner.       Influenza A by PCR NEGATIVE NEGATIVE Final   Influenza B by PCR NEGATIVE NEGATIVE Final    Comment: (NOTE) The Xpert Xpress SARS-CoV-2/FLU/RSV plus assay is intended as an aid in the diagnosis of influenza from Nasopharyngeal swab specimens and should not be used as a sole basis for treatment. Nasal washings and aspirates are unacceptable for Xpert Xpress SARS-CoV-2/FLU/RSV testing.  Fact Sheet for Patients: [place]  Fact Sheet for Healthcare Providers: BloggerCourse.com  This test is not yet approved or cleared by the [employer] FDA and has been authorized for detection and/or diagnosis of SARS-CoV-2 by FDA under an Emergency  Use Authorization (EUA). This EUA will remain in effect (meaning this test can be used) for the duration of the COVID-19 declaration under Section 564(b)(1) of the Act, 21 U.S.C. section 360bbb-3(b)(1), unless the authorization is terminated or revoked.  Performed at [facility], [address] [place], [place] [address]      Radiology Studies: US Renal  Result Date: 05/01/2021 CLINICAL DATA:  Acute renal failure EXAM: RENAL / URINARY TRACT ULTRASOUND COMPLETE COMPARISON:  None. FINDINGS: Right Kidney: Renal measurements: 9.1 x 4.2 x 5.0 cm = volume: 96 mL. Increased echotexture. No mass or hydronephrosis. Left Kidney: Renal measurements: 9.1 x 4.4 x 4.1 cm = volume: 86 mL. Increased echotexture. No mass  or hydronephrosis. Bladder: Appears normal for degree of bladder distention. Other: None. IMPRESSION: Increased echotexture compatible chronic medical renal disease. No hydronephrosis. Electronically Signed   By: Charlett Nose M.D.   On: 05/01/2021 22:54     Scheduled Meds:  enoxaparin (LOVENOX) injection  30 mg Subcutaneous Q24H   potassium chloride  40 mEq Oral Once   Continuous Infusions:   LOS: 2 days    Time spent:23min  Zannie Cove, MD [facility]   05/03/2021, 12:19 PM

## 2021-05-04 LAB — HEMOGLOBIN A1C
Hgb A1c MFr Bld: 6.1 % — ABNORMAL HIGH (ref 4.8–5.6)
Mean Plasma Glucose: 128 mg/dL

## 2021-05-04 LAB — BASIC METABOLIC PANEL
Anion gap: 7 (ref 5–15)
BUN: 29 mg/dL — ABNORMAL HIGH (ref 6–20)
CO2: 25 mmol/L (ref 22–32)
Calcium: 8.3 mg/dL — ABNORMAL LOW (ref 8.9–10.3)
Chloride: 107 mmol/L (ref 98–111)
Creatinine, Ser: 1.61 mg/dL — ABNORMAL HIGH (ref 0.44–1.00)
GFR, Estimated: 39 mL/min — ABNORMAL LOW (ref >60)
Glucose, Bld: 142 mg/dL — ABNORMAL HIGH (ref 70–99)
Potassium: 4.3 mmol/L (ref 3.5–5.1)
Sodium: 139 mmol/L (ref 135–145)

## 2021-05-04 MED ORDER — SODIUM CHLORIDE 0.9 % IV SOLN: INTRAVENOUS | Status: DC

## 2021-05-04 MED ORDER — ENOXAPARIN SODIUM 40 MG/0.4ML IJ SOSY
40.0000 mg | PREFILLED_SYRINGE | INTRAMUSCULAR | Status: DC
  Administered 2021-05-04 – 2021-05-05 (×2): 40 mg via SUBCUTANEOUS
  Filled 2021-05-04 (×2): qty 0.4

## 2021-05-04 NOTE — Plan of Care (Signed)

## 2021-05-04 NOTE — Progress Notes (Signed)
PROGRESS NOTE    Stephanie [name]  ZOX:[id number] DOB: 14-Nov-1976 DOA: 05/01/2021 PCP: Patient, No Pcp Per (Inactive)  Brief Narrative: 45 year old female with schizophrenia, ?homelessness presented to the ED last night with weakness, dizziness and headache.,  She is homeless and laid on the sidewalk take a nap, after that felt extremely hot and overheated, he was wearing a sweatshirt, long socks, multiple layers of clothes in the heat, had not eaten much in a few days, also had some episodes of nausea and one episode of vomiting. -In the emergency room she was noted to have a creatinine of 2.8, elevated CPK, hyperglycemia,   Assessment & Plan:   Acute kidney injury Dehydration Heat exhaustion -Baseline creatinine is 1.1, creatinine on admission was 2.8 -improving creatinine trending down to 1.6, renal ultrasound without hydronephrosis -Blood pressure is a bit soft today, add gentle fluids for 8-10hours -Poor social situation and apparently does not have access to clean water as well -TOC input requested -discharge planning  Hyperglycemia -Hemoglobin A1c 6.1 consistent with borderline diabetes  Nausea and vomiting -Likely secondary to heat exhaustion, supportive care, fluids -Improved, advanced diet  Homelessness -TOC consult appreciated, apparently can go back to an apartment   DVT prophylaxis: Lovenox Code Status: Full code Family Communication: No immediate family, homeless Disposition Plan:  Status is: Inpatient  Remains inpatient appropriate because:Inpatient level of care appropriate due to severity of illness  Dispo: The patient is from: Home              Anticipated d/c is to:  home              Patient currently is not medically stable to d/c.   Difficult to place patient No  Subjective: -Feels okay overall, discussed possibility of discharge, need to increase fluid intake for the next few days as creatinine has not normalized yet, tells me she does not have  access to clean water and hands often forgoes this, drinks sodas intermittently  Objective: Vitals:   05/03/21 1508 05/03/21 2122 05/04/21 0500 05/04/21 0600  BP: (!) 94/54 (!) 101/59  (!) 96/52  Pulse: 67 63  66  Resp: 20 (!) 21  17  Temp: 97.8 F (36.6 C) 98.3 F (36.8 C)  97.9 F (36.6 C)  TempSrc: Oral Oral  Oral  SpO2: 100% 100%  99%  Weight:   64 kg   Height:        Intake/Output Summary (Last 24 hours) at 05/04/2021 1141 Last data filed at 05/04/2021 0925 Gross per 24 hour  Intake 1200 ml  Output --  Net 1200 ml   Filed Weights   05/01/21 1727 05/04/21 0500  Weight: 68 kg 64 kg    Examination:  General exam: Pleasant young female, sitting up in bed, AAOx3, no distress CVS: S1-S2, regular rate rhythm Lungs: Clear bilaterally Abdomen: Soft, nontender, bowel sounds present Extremities: No edema  Skin: No rashes Psychiatry: Flat affect  Data Reviewed:   CBC: Recent Labs  Lab 05/01/21 2117 05/02/21 0903 05/03/21 0442  WBC 7.7 6.4 4.4  NEUTROABS 6.3  --   --   HGB 13.3 11.3* 9.9*  HCT 43.6 37.5 33.3*  MCV 85.3 86.0 85.8  PLT 203 168 144*   Basic Metabolic Panel: Recent Labs  Lab 05/01/21 2117 05/02/21 0903 05/03/21 0442 05/04/21 0440  NA 144 144 141 139  K 3.9 3.5 3.4* 4.3  CL 103 105 108 107  CO2 24 27 26 25   GLUCOSE 154* 174* 151* 142*  BUN 85* 69* 51* 29*  CREATININE 2.87* 2.56* 2.02* 1.61*  CALCIUM 8.9 8.8* 8.4* 8.3*  MG 2.7* 2.3  --   --    GFR: Estimated Creatinine Clearance: 35.1 mL/min (A) (by C-G formula based on SCr of 1.61 mg/dL (H)). Liver Function Tests: Recent Labs  Lab 05/01/21 2117 05/02/21 0903  AST 30 28  ALT 29 22  ALKPHOS 69 58  BILITOT 0.8 0.5  PROT 7.9 6.4*  ALBUMIN 3.9 3.3*   No results for input(s): LIPASE, AMYLASE in the last 168 hours. No results for input(s): AMMONIA in the last 168 hours. Coagulation Profile: No results for input(s): INR, PROTIME in the last 168 hours. Cardiac Enzymes: Recent Labs   Lab 05/01/21 2117  CKTOTAL 38   BNP (last 3 results) No results for input(s): PROBNP in the last 8760 hours. HbA1C: Recent Labs    05/02/21 1103  HGBA1C 6.1*   CBG: No results for input(s): GLUCAP in the last 168 hours. Lipid Profile: No results for input(s): CHOL, HDL, LDLCALC, TRIG, CHOLHDL, LDLDIRECT in the last 72 hours. Thyroid Function Tests: No results for input(s): TSH, T4TOTAL, FREET4, T3FREE, THYROIDAB in the last 72 hours. Anemia Panel: No results for input(s): VITAMINB12, FOLATE, FERRITIN, TIBC, IRON, RETICCTPCT in the last 72 hours. Urine analysis:    Component Value Date/Time   COLORURINE YELLOW 05/01/2021 2220   APPEARANCEUR CLOUDY (A) 05/01/2021 2220   LABSPEC 1.019 05/01/2021 2220   PHURINE 5.0 05/01/2021 2220   GLUCOSEU 50 (A) 05/01/2021 2220   HGBUR SMALL (A) 05/01/2021 2220   BILIRUBINUR NEGATIVE 05/01/2021 2220   KETONESUR 20 (A) 05/01/2021 2220   PROTEINUR 30 (A) 05/01/2021 2220   NITRITE NEGATIVE 05/01/2021 2220   LEUKOCYTESUR MODERATE (A) 05/01/2021 2220   Sepsis Labs: @LABRCNTIP (procalcitonin:4,lacticidven:4)  ) Recent Results (from the past 240 hour(s))  Resp Panel by RT-PCR (Flu A&B, Covid) Nasopharyngeal Swab     Status: None   Collection Time: 05/01/21 10:20 PM   Specimen: Nasopharyngeal Swab; Nasopharyngeal(NP) swabs in vial transport medium  Result Value Ref Range Status   SARS Coronavirus 2 by RT PCR NEGATIVE NEGATIVE Final    Comment: (NOTE) SARS-CoV-2 target nucleic acids are NOT DETECTED.  The SARS-CoV-2 RNA is generally detectable in upper respiratory specimens during the acute phase of infection. The lowest concentration of SARS-CoV-2 viral copies this assay can detect is 138 copies/mL. A negative result does not preclude SARS-Cov-2 infection and should not be used as the sole basis for treatment or other patient management decisions. A negative result may occur with  improper specimen collection/handling, submission of  specimen other than nasopharyngeal swab, presence of viral mutation(s) within the areas targeted by this assay, and inadequate number of viral copies(<138 copies/mL). A negative result must be combined with clinical observations, patient history, and epidemiological information. The expected result is Negative.  Fact Sheet for Patients:  07/01/21  Fact Sheet for Healthcare Providers:  BloggerCourse.com  This test is no t yet approved or cleared by the [employer] FDA and  has been authorized for detection and/or diagnosis of SARS-CoV-2 by FDA under an Emergency Use Authorization (EUA). This EUA will remain  in effect (meaning this test can be used) for the duration of the COVID-19 declaration under Section 564(b)(1) of the Act, 21 U.S.C.section 360bbb-3(b)(1), unless the authorization is terminated  or revoked sooner.       Influenza A by PCR NEGATIVE NEGATIVE Final   Influenza B by PCR NEGATIVE NEGATIVE Final    Comment: (  NOTE) The Xpert Xpress SARS-CoV-2/FLU/RSV plus assay is intended as an aid in the diagnosis of influenza from Nasopharyngeal swab specimens and should not be used as a sole basis for treatment. Nasal washings and aspirates are unacceptable for Xpert Xpress SARS-CoV-2/FLU/RSV testing.  Fact Sheet for Patients: BloggerCourse.com  Fact Sheet for Healthcare Providers: [employer]ker[employer]it  This test is not yet approved or cleared by the [place] FDA and has been authorized for detection and/or diagnosis of SARS-CoV-2 by FDA under an Emergency Use Authorization (EUA). This EUA will remain in effect (meaning this test can be used) for the duration of the COVID-19 declaration under Section 564(b)(1) of the Act, 21 U.S.C. section 360bbb-3(b)(1), unless the authorization is terminated or revoked.  Performed at [facility], [address], [place], [place] [address]     Scheduled Meds:  enoxaparin (LOVENOX) injection  40 mg Subcutaneous Q24H   potassium chloride  40 mEq Oral Once   Continuous Infusions:  sodium chloride 75 mL/hr at 05/04/21 1056    LOS: 3 days   Time spent:52min  Zannie Cove, MD [facility]   05/04/2021, 11:41 AM

## 2021-05-05 LAB — BASIC METABOLIC PANEL
Anion gap: 5 (ref 5–15)
BUN: 20 mg/dL (ref 6–20)
CO2: 29 mmol/L (ref 22–32)
Calcium: 7.9 mg/dL — ABNORMAL LOW (ref 8.9–10.3)
Chloride: 104 mmol/L (ref 98–111)
Creatinine, Ser: 1.41 mg/dL — ABNORMAL HIGH (ref 0.44–1.00)
GFR, Estimated: 46 mL/min — ABNORMAL LOW (ref >60)
Glucose, Bld: 86 mg/dL (ref 70–99)
Potassium: 3.4 mmol/L — ABNORMAL LOW (ref 3.5–5.1)
Sodium: 138 mmol/L (ref 135–145)

## 2021-05-05 MED ORDER — ACETAMINOPHEN 325 MG PO TABS: 650.0000 mg | ORAL_TABLET | Freq: Four times a day (QID) | ORAL | Status: DC | PRN

## 2021-05-05 NOTE — Discharge Summary (Signed)
Physician Discharge Summary  Stephanie [name]:[id number] DOB: 1976/01/13 DOA: 05/01/2021  PCP: Patient, No Pcp Per (Inactive)  Admit date: 05/01/2021 Discharge date: 05/05/2021  Time spent:  Recommendations for Outpatient Follow-up:  PCP in 1 week  Discharge Diagnoses:  Principal Problem:   AKI (acute kidney injury) (HCC) Borderline diabetes   Acute prerenal azotemia   Dehydration   Nausea & vomiting   Headache   Discharge Condition: Improved  Diet recommendation: Carb modified  Filed Weights   05/01/21 1727 05/04/21 0500 05/05/21 0500  Weight: 68 kg 64 kg 54.8 kg    History of present illness:  45 year old female with schizophrenia, ?homelessness presented to the ED last night with weakness, dizziness and headache.,  She is homeless and laid on the sidewalk take a nap, after that felt extremely hot and overheated, he was wearing a sweatshirt, long socks, multiple layers of clothes in the heat, had not eaten much in a few days, also had some episodes of nausea and one episode of vomiting. -In the emergency room she was noted to have a creatinine of 2.8, elevated CPK, hyperglycemia  Hospital Course:   Acute kidney injury Dehydration Heat exhaustion -Baseline creatinine is 1.1, creatinine on admission was 2.8 -improving creatinine trending down to 1.4, renal ultrasound without hydronephrosis -Overall improved and stable, discharged home in stable condition, advised to stay well-hydrated and avoid NSAIDs, has a poor social situation lives in a small apartment with [employer] reportedly paying her rent   Hyperglycemia -Hemoglobin A1c 6.1 consistent with borderline diabetes   Nausea and vomiting -Likely secondary to heat exhaustion, supportive care, fluids -Improved, advanced diet     Discharge Exam: Vitals:   05/04/21 2130 05/05/21 0645  BP: 135/74 112/71  Pulse: 80 64  Resp: 17 16  Temp: 97.7 F (36.5 C) 98.2 F (36.8 C)  SpO2: 100% 99%   Gen:  Awake, Alert, Oriented X 3,  HEENT: no JVD Lungs: Good air movement bilaterally, CTAB CVS: S1S2/RRR Abd: soft, Non tender, non distended, BS present Extremities: No edema Skin: no new rashes on exposed skin   Discharge Instructions   Discharge Instructions     Diet - low sodium heart healthy   Complete by: As directed    Increase activity slowly   Complete by: As directed       Allergies as of 05/05/2021       Reactions   Haldol [haloperidol Lactate] Anaphylaxis   Penicillins Nausea And Vomiting   Did it involve swelling of the face/tongue/throat, SOB, or low BP? No Did it involve sudden or severe rash/hives, skin peeling, or any reaction on the inside of your mouth or nose? No Did you need to seek medical attention at a hospital or doctor's office? No When did it last happen?      unknown  If all above answers are "NO", may proceed with cephalosporin use.   Penicillins Nausea And Vomiting        Medication List     TAKE these medications    acetaminophen 325 MG tablet Commonly known as: TYLENOL Take 2 tablets (650 mg total) by mouth every 6 (six) hours as needed for mild pain (or Fever >/= 101).       Allergies  Allergen Reactions   Haldol [Haloperidol Lactate] Anaphylaxis   Penicillins Nausea And Vomiting    Did it involve swelling of the face/tongue/throat, SOB, or low BP? No Did it involve sudden or severe rash/hives, skin peeling, or any reaction on  the inside of your mouth or nose? No Did you need to seek medical attention at a hospital or doctor's office? No When did it last happen?      unknown  If all above answers are "NO", may proceed with cephalosporin use.    Penicillins Nausea And Vomiting      The results of significant diagnostics from this hospitalization (including imaging, microbiology, ancillary and laboratory) are listed below for reference.    Significant Diagnostic Studies: US Renal  Result Date: 05/01/2021 CLINICAL DATA:  Acute  renal failure EXAM: RENAL / URINARY TRACT ULTRASOUND COMPLETE COMPARISON:  None. FINDINGS: Right Kidney: Renal measurements: 9.1 x 4.2 x 5.0 cm = volume: 96 mL. Increased echotexture. No mass or hydronephrosis. Left Kidney: Renal measurements: 9.1 x 4.4 x 4.1 cm = volume: 86 mL. Increased echotexture. No mass or hydronephrosis. Bladder: Appears normal for degree of bladder distention. Other: None. IMPRESSION: Increased echotexture compatible chronic medical renal disease. No hydronephrosis. Electronically Signed   By: Charlett Nose M.D.   On: 05/01/2021 22:54    Microbiology: Recent Results (from the past 240 hour(s))  Resp Panel by RT-PCR (Flu A&B, Covid) Nasopharyngeal Swab     Status: None   Collection Time: 05/01/21 10:20 PM   Specimen: Nasopharyngeal Swab; Nasopharyngeal(NP) swabs in vial transport medium  Result Value Ref Range Status   SARS Coronavirus 2 by RT PCR NEGATIVE NEGATIVE Final    Comment: (NOTE) SARS-CoV-2 target nucleic acids are NOT DETECTED.  The SARS-CoV-2 RNA is generally detectable in upper respiratory specimens during the acute phase of infection. The lowest concentration of SARS-CoV-2 viral copies this assay can detect is 138 copies/mL. A negative result does not preclude SARS-Cov-2 infection and should not be used as the sole basis for treatment or other patient management decisions. A negative result may occur with  improper specimen collection/handling, submission of specimen other than nasopharyngeal swab, presence of viral mutation(s) within the areas targeted by this assay, and inadequate number of viral copies(<138 copies/mL). A negative result must be combined with clinical observations, patient history, and epidemiological information. The expected result is Negative.  Fact Sheet for Patients:  BloggerCourse.com  Fact Sheet for Healthcare Providers:  SeriousBroker.it  This test is no t yet approved or  cleared by the [place] FDA and  has been authorized for detection and/or diagnosis of SARS-CoV-2 by FDA under an Emergency Use Authorization (EUA). This EUA will remain  in effect (meaning this test can be used) for the duration of the COVID-19 declaration under Section 564(b)(1) of the Act, 21 U.S.C.section 360bbb-3(b)(1), unless the authorization is terminated  or revoked sooner.       Influenza A by PCR NEGATIVE NEGATIVE Final   Influenza B by PCR NEGATIVE NEGATIVE Final    Comment: (NOTE) The Xpert Xpress SARS-CoV-2/FLU/RSV plus assay is intended as an aid in the diagnosis of influenza from Nasopharyngeal swab specimens and should not be used as a sole basis for treatment. Nasal washings and aspirates are unacceptable for Xpert Xpress SARS-CoV-2/FLU/RSV testing.  Fact Sheet for Patients: BloggerCourse.com  Fact Sheet for Healthcare Providers: SeriousBroker.it  This test is not yet approved or cleared by the [place] FDA and has been authorized for detection and/or diagnosis of SARS-CoV-2 by FDA under an Emergency Use Authorization (EUA). This EUA will remain in effect (meaning this test can be used) for the duration of the COVID-19 declaration under Section 564(b)(1) of the Act, 21 U.S.C. section 360bbb-3(b)(1), unless the authorization is terminated or  revoked.  Performed at [facility], [address] [place], [place] [address]      Labs: Basic Metabolic Panel: Recent Labs  Lab 05/01/21 2117 05/02/21 0903 05/03/21 0442 05/04/21 0440 05/05/21 0557  NA 144 144 141 139 138  K 3.9 3.5 3.4* 4.3 3.4*  CL 103 105 108 107 104  CO2 24 27 26 25 29   GLUCOSE 154* 174* 151* 142* 86  BUN 85* 69* 51* 29* 20  CREATININE 2.87* 2.56* 2.02* 1.61* 1.41*  CALCIUM 8.9 8.8* 8.4* 8.3* 7.9*  MG 2.7* 2.3  --   --   --    Liver Function Tests: Recent Labs  Lab 05/01/21 2117 05/02/21 0903  AST 30  28  ALT 29 22  ALKPHOS 69 58  BILITOT 0.8 0.5  PROT 7.9 6.4*  ALBUMIN 3.9 3.3*   No results for input(s): LIPASE, AMYLASE in the last 168 hours. No results for input(s): AMMONIA in the last 168 hours. CBC: Recent Labs  Lab 05/01/21 2117 05/02/21 0903 05/03/21 0442  WBC 7.7 6.4 4.4  NEUTROABS 6.3  --   --   HGB 13.3 11.3* 9.9*  HCT 43.6 37.5 33.3*  MCV 85.3 86.0 85.8  PLT 203 168 144*   Cardiac Enzymes: Recent Labs  Lab 05/01/21 2117  CKTOTAL 38   BNP: BNP (last 3 results) No results for input(s): BNP in the last 8760 hours.  ProBNP (last 3 results) No results for input(s): PROBNP in the last 8760 hours.  CBG: No results for input(s): GLUCAP in the last 168 hours.     Signed:  2118 MD.  [facility] 05/05/2021, 11:32 AM

## 2021-05-05 NOTE — Progress Notes (Addendum)
Pt iv  Removed per order. Catheter intact upon removal. Skin: clean dry & intact.  AVS provided and reviewed with patient. All questions answered.  Pt currently does not have a ride to go home but states she is still calling around.  NT Ash packed patient belongings and gave it to pt.

## 2021-05-05 NOTE — TOC Progression Note (Signed)
Transition of Care [facility]) - Progression Note    Patient Details  Name: Stephanie [name] MRN: [id number] Date of Birth: 14-Feb-1976  Transition of Care [facility]) CM/SW Contact  Geni Bers, RN Phone Number: 05/05/2021, 9:45 AM  Clinical Narrative:    Spoke with pt concerning apartment. Pt states that something is wrong with her water. Asked pt if she told the landlord about her water. Pt states that she has not because she don't have a cell phone. Asked pt how she paid her rent. Pt states that [employer] pay her rent and she has not spoken to them. Encouraged pt to call while here in the hospital. This CM called [employer] on 6/13 yesterday left VM related to notes that pt used [employer] for paying her rent. A return call was received from [facility]. Rep from [employer] states she will check to see if [employer] pay pt's rent. After speaking to pt she states that [employer] pays rent. TOC will sign out, pt has a place to live.    Expected Discharge Plan: Home/Self Care Barriers to Discharge: No Barriers Identified  Expected Discharge Plan and Services Expected Discharge Plan: Home/Self Care       Living arrangements for the past 2 months: Apartment Expected Discharge Date: 05/05/21                                     Social Determinants of Health (SDOH) Interventions    Readmission Risk Interventions No flowsheet data found.

## 2021-05-09 ENCOUNTER — Emergency Department (HOSPITAL_COMMUNITY)
Admission: EM | Admit: 2021-05-09 | Discharge: 2021-05-10 | Disposition: A | Discharge status: Home / Self Care | Payer: Self-pay | Attending: Emergency Medicine | Admitting: Emergency Medicine

## 2021-05-09 ENCOUNTER — Emergency Department (HOSPITAL_COMMUNITY): Payer: Self-pay

## 2021-05-09 ENCOUNTER — Other Ambulatory Visit: Payer: Self-pay

## 2021-05-09 DIAGNOSIS — S00211A Abrasion of right eyelid and periocular area, initial encounter: Secondary | ICD-10-CM | POA: Insufficient documentation

## 2021-05-09 DIAGNOSIS — R55 Syncope and collapse: Secondary | ICD-10-CM | POA: Insufficient documentation

## 2021-05-09 DIAGNOSIS — Y9301 Activity, walking, marching and hiking: Secondary | ICD-10-CM | POA: Insufficient documentation

## 2021-05-09 DIAGNOSIS — E162 Hypoglycemia, unspecified: Secondary | ICD-10-CM | POA: Insufficient documentation

## 2021-05-09 DIAGNOSIS — T148XXA Other injury of unspecified body region, initial encounter: Secondary | ICD-10-CM

## 2021-05-09 DIAGNOSIS — W01198A Fall on same level from slipping, tripping and stumbling with subsequent striking against other object, initial encounter: Secondary | ICD-10-CM | POA: Insufficient documentation

## 2021-05-09 NOTE — ED Provider Notes (Cosign Needed Addendum)
Emergency Medicine Provider Triage Evaluation Note  Stephanie [name] , a 45 y.o. female  was evaluated in triage.  Pt complains of syncopal episode today while walking to the store. States was admitted to the hospital for 2 days for dehydration, went home but doesn't have clean drinking water so she hasn't had anything to drink. Hit head on side walk, lac to right eyebrow area. Also nausea and vomiting.   Review of Systems  Positive: Syncope, n/v, laceration  Negative: Headache, abdominal pain  Physical Exam  There were no vitals taken for this visit. Gen:   Awake, no distress   Resp:  Normal effort  MSK:   Moves extremities without difficulty  Other:  Minor lac to right eye brow, no active  Medical Decision Making  Medically screening exam initiated at 3:50 PM.  Appropriate orders placed.  Renn Stille Diamant was informed that the remainder of the evaluation will be completed by another provider, this initial triage assessment does not replace that evaluation, and the importance of remaining in the ED until their evaluation is complete.   Notified by CT, patient refused CT.     Jeannie Fend, PA-C 05/09/21 1553    Jeannie Fend, PA-C 05/09/21 1800

## 2021-05-09 NOTE — ED Triage Notes (Signed)
Per EMS, pt reports passing out while walking to store. She was dc'd from ED 2 days ago and reports she has not eaten since. ECG NSR.   BP 93/65 HR 67 O2 97 CBG 72

## 2021-05-10 ENCOUNTER — Encounter (HOSPITAL_COMMUNITY): Payer: Self-pay | Admitting: Emergency Medicine

## 2021-05-10 LAB — CBC WITH DIFFERENTIAL/PLATELET
Abs Immature Granulocytes: 0.01 10*3/uL (ref 0.00–0.07)
Basophils Absolute: 0 10*3/uL (ref 0.0–0.1)
Basophils Relative: 0 %
Eosinophils Absolute: 0.1 10*3/uL (ref 0.0–0.5)
Eosinophils Relative: 1 %
HCT: 38.6 % (ref 36.0–46.0)
Hemoglobin: 11.9 g/dL — ABNORMAL LOW (ref 12.0–15.0)
Immature Granulocytes: 0 %
Lymphocytes Relative: 29 %
Lymphs Abs: 1.9 10*3/uL (ref 0.7–4.0)
MCH: 26 pg (ref 26.0–34.0)
MCHC: 30.8 g/dL (ref 30.0–36.0)
MCV: 84.5 fL (ref 80.0–100.0)
Monocytes Absolute: 0.6 10*3/uL (ref 0.1–1.0)
Monocytes Relative: 9 %
Neutro Abs: 4.1 10*3/uL (ref 1.7–7.7)
Neutrophils Relative %: 61 %
Platelets: 274 10*3/uL (ref 150–400)
RBC: 4.57 MIL/uL (ref 3.87–5.11)
RDW: 16.5 % — ABNORMAL HIGH (ref 11.5–15.5)
WBC: 6.7 10*3/uL (ref 4.0–10.5)
nRBC: 0 % (ref 0.0–0.2)

## 2021-05-10 LAB — I-STAT CHEM 8, ED
BUN: 24 mg/dL — ABNORMAL HIGH (ref 6–20)
Calcium, Ion: 1.2 mmol/L (ref 1.15–1.40)
Chloride: 106 mmol/L (ref 98–111)
Creatinine, Ser: 0.9 mg/dL (ref 0.44–1.00)
Glucose, Bld: 62 mg/dL — ABNORMAL LOW (ref 70–99)
HCT: 38 % (ref 36.0–46.0)
Hemoglobin: 12.9 g/dL (ref 12.0–15.0)
Potassium: 3.9 mmol/L (ref 3.5–5.1)
Sodium: 144 mmol/L (ref 135–145)
TCO2: 27 mmol/L (ref 22–32)

## 2021-05-10 LAB — CBG MONITORING, ED
Glucose-Capillary: 64 mg/dL — ABNORMAL LOW (ref 70–99)
Glucose-Capillary: 117 mg/dL — ABNORMAL HIGH (ref 70–99)

## 2021-05-10 MED ORDER — SODIUM CHLORIDE 0.9 % IV BOLUS
500.0000 mL | Freq: Once | INTRAVENOUS | Status: AC
  Administered 2021-05-10: 500 mL via INTRAVENOUS

## 2021-05-10 MED ORDER — ONDANSETRON HCL 4 MG/2ML IJ SOLN
4.0000 mg | Freq: Once | INTRAMUSCULAR | Status: AC
  Administered 2021-05-10: 4 mg via INTRAVENOUS
  Filled 2021-05-10: qty 2

## 2021-05-10 NOTE — ED Notes (Signed)
Patient given sandwich.  

## 2021-05-10 NOTE — ED Provider Notes (Addendum)
[facility]-EMERGENCY DEPT Provider Note   CSN: [id number] Arrival date & time: 05/09/21  1528     History Chief Complaint  Patient presents with   Loss of Consciousness    Stephanie [name] is a 45 y.o. female.  The history is provided by the patient.  Loss of Consciousness Episode history:  Single Most recent episode:  Today Duration: seconds. Timing:  Constant Progression:  Resolved Chronicity:  New Context: normal activity   Context: not urination   Witnessed: no   Relieved by:  Nothing Worsened by:  Nothing Ineffective treatments:  None tried Associated symptoms: no anxiety, no chest pain, no confusion, no diaphoresis, no dizziness, no fever, no focal sensory loss, no headaches, no nausea, no rectal bleeding, no seizures, no shortness of breath, no visual change, no vomiting and no weakness       Past Medical History:  Diagnosis Date   Schizophrenia [facility])     Patient Active Problem List   Diagnosis Date Noted   Acute prerenal azotemia 05/02/2021   Dehydration 05/02/2021   Nausea & vomiting 05/02/2021   Headache 05/02/2021   AKI (acute kidney injury) (HCC) 05/01/2021   Schizophrenia (HCC) 10/10/2020   Schizoaffective disorder (HCC) 03/31/2019    Past Surgical History:  Procedure Laterality Date   INDUCED ABORTION       OB History   No obstetric history on file.     Family History  Problem Relation Age of Onset   Cancer Father     Social History   Tobacco Use   Smoking status: Never   Smokeless tobacco: Never  Vaping Use   Vaping Use: Former  Substance Use Topics   Alcohol use: Never   Drug use: Never    Home Medications Prior to Admission medications   Medication Sig Start Date End Date Taking? Authorizing Provider  acetaminophen (TYLENOL) 325 MG tablet Take 2 tablets (650 mg total) by mouth every 6 (six) hours as needed for mild pain (or Fever >/= 101). 05/05/21  Yes Zannie Cove, MD    Allergies    Haldol  [haloperidol lactate], Penicillins, and Penicillins  Review of Systems   Review of Systems  Constitutional:  Negative for diaphoresis and fever.  HENT:  Negative for drooling.   Eyes:  Negative for redness.  Respiratory:  Negative for shortness of breath.   Cardiovascular:  Positive for syncope. Negative for chest pain.  Gastrointestinal:  Negative for nausea and vomiting.  Genitourinary:  Negative for difficulty urinating.  Musculoskeletal:  Negative for neck pain and neck stiffness.  Skin:  Negative for rash.  Neurological:  Negative for dizziness, seizures, facial asymmetry, speech difficulty, weakness and headaches.  Psychiatric/Behavioral:  Negative for confusion.   All other systems reviewed and are negative.  Physical Exam Updated Vital Signs BP (!) 128/55   Pulse 70   Temp 98.2 F (36.8 C) (Oral)   Resp 16   SpO2 100%   Physical Exam Vitals and nursing note reviewed.  Constitutional:      Appearance: Normal appearance. She is not diaphoretic.  HENT:     Head: Normocephalic. No raccoon eyes or Battle's sign.      Right Ear: Tympanic membrane normal. No mastoid tenderness. No hemotympanum.     Left Ear: No mastoid tenderness. No hemotympanum.     Nose: Nose normal.  Eyes:     Extraocular Movements: Extraocular movements intact.     Conjunctiva/sclera: Conjunctivae normal.     Pupils: Pupils are equal,  round, and reactive to light.  Cardiovascular:     Rate and Rhythm: Normal rate and regular rhythm.     Pulses: Normal pulses.     Heart sounds: Normal heart sounds.  Pulmonary:     Effort: Pulmonary effort is normal.     Breath sounds: Normal breath sounds.  Abdominal:     General: Abdomen is flat. Bowel sounds are normal.     Palpations: Abdomen is soft.     Tenderness: There is no abdominal tenderness. There is no guarding.  Musculoskeletal:        General: No tenderness. Normal range of motion.     Cervical back: Normal range of motion and neck supple. No  tenderness.  Lymphadenopathy:     Cervical: No cervical adenopathy.  Skin:    General: Skin is warm and dry.     Capillary Refill: Capillary refill takes less than 2 seconds.  Neurological:     General: No focal deficit present.     Mental Status: She is alert and oriented to person, place, and time.     Deep Tendon Reflexes: Reflexes normal.  Psychiatric:        Mood and Affect: Mood normal.        Behavior: Behavior normal.    ED Results / Procedures / Treatments   Labs (all labs ordered are listed, but only abnormal results are displayed) Results for orders placed or performed during the hospital encounter of 05/09/21  CBC with Differential  Result Value Ref Range   WBC 6.7 4.0 - 10.5 K/uL   RBC 4.57 3.87 - 5.11 MIL/uL   Hemoglobin 11.9 (L) 12.0 - 15.0 g/dL   HCT 57.3 22.0 - 25.4 %   MCV 84.5 80.0 - 100.0 fL   MCH 26.0 26.0 - 34.0 pg   MCHC 30.8 30.0 - 36.0 g/dL   RDW 27.0 (H) 62.3 - 76.2 %   Platelets 274 150 - 400 K/uL   nRBC 0.0 0.0 - 0.2 %   Neutrophils Relative % 61 %   Neutro Abs 4.1 1.7 - 7.7 K/uL   Lymphocytes Relative 29 %   Lymphs Abs 1.9 0.7 - 4.0 K/uL   Monocytes Relative 9 %   Monocytes Absolute 0.6 0.1 - 1.0 K/uL   Eosinophils Relative 1 %   Eosinophils Absolute 0.1 0.0 - 0.5 K/uL   Basophils Relative 0 %   Basophils Absolute 0.0 0.0 - 0.1 K/uL   Immature Granulocytes 0 %   Abs Immature Granulocytes 0.01 0.00 - 0.07 K/uL  I-stat chem 8, ED (not at [facility] or [facility])  Result Value Ref Range   Sodium 144 135 - 145 mmol/L   Potassium 3.9 3.5 - 5.1 mmol/L   Chloride 106 98 - 111 mmol/L   BUN 24 (H) 6 - 20 mg/dL   Creatinine, Ser 8.31 0.44 - 1.00 mg/dL   Glucose, Bld 62 (L) 70 - 99 mg/dL   Calcium, Ion 5.17 1.15 - 1.40 mmol/L   TCO2 27 22 - 32 mmol/L   Hemoglobin 12.9 12.0 - 15.0 g/dL   HCT 61.6 07.3 - 71.0 %  CBG monitoring, ED  Result Value Ref Range   Glucose-Capillary 64 (L) 70 - 99 mg/dL  POC CBG, ED  Result Value Ref Range   Glucose-Capillary  117 (H) 70 - 99 mg/dL   US Renal  Result Date: 05/01/2021 CLINICAL DATA:  Acute renal failure EXAM: RENAL / URINARY TRACT ULTRASOUND COMPLETE COMPARISON:  None. FINDINGS: Right Kidney: Renal  measurements: 9.1 x 4.2 x 5.0 cm = volume: 96 mL. Increased echotexture. No mass or hydronephrosis. Left Kidney: Renal measurements: 9.1 x 4.4 x 4.1 cm = volume: 86 mL. Increased echotexture. No mass or hydronephrosis. Bladder: Appears normal for degree of bladder distention. Other: None. IMPRESSION: Increased echotexture compatible chronic medical renal disease. No hydronephrosis. Electronically Signed   By: Charlett Nose M.D.   On: 05/01/2021 22:54      Radiology No results found.  Procedures Procedures   Medications Ordered in ED Medications  sodium chloride 0.9 % bolus 500 mL (0 mLs Intravenous Stopped 05/10/21 0329)  ondansetron (ZOFRAN) injection 4 mg (4 mg Intravenous Given 05/10/21 0427)    ED Course  I have reviewed the triage vital signs and the nursing notes.  Pertinent labs & imaging results that were available during my care of the patient were reviewed by me and considered in my medical decision making (see chart for details).   Given juice and glucose has improved.  She has been counseled about drinking water and eating foods with carbohydrates.     Patient has been informed of need for head CT.  She is AO4 and has decision making capacity to refuse care.  She is refusing this test understanding that the risks of not having it are death, coma and prolonged morbidity due to failure to diagnose a bleed.  She verbalizes understanding of these risks and does not want this test.  She will sign against medical advice.   I suspect she passed out secondary to low glucose as the patient is not clinically dehydrated.     Final Clinical Impression(s) / ED Diagnoses Final diagnoses:  Abrasion  Syncope, unspecified syncope type  Hypoglycemia      Return for intractable cough, coughing up  blood, fevers > 100.4 unrelieved by medication, shortness of breath, intractable vomiting, chest pain, shortness of breath, weakness, numbness, changes in speech, facial asymmetry, abdominal pain, passing out, Inability to tolerate liquids or food, cough, altered mental status or any concerns. No signs of systemic illness or infection. The patient is nontoxic-appearing on exam and vital signs are within normal limits. I have reviewed the triage vital signs and the nursing notes. Pertinent labs & imaging results that were available during my care of the patient were reviewed by me and considered in my medical decision making (see chart for details). After history, exam, and medical workup I feel the patient has been appropriately medically screened and is safe for discharge home. Pertinent diagnoses were discussed with the patient. Patient was given return precautions.  Rx / DC Orders ED Discharge Orders     None        Azia Toutant, MD 05/10/21 1610    Nicanor Alcon, Ivie Maese, MD 05/10/21 9604

## 2021-05-10 NOTE — ED Notes (Signed)
Patient signed AMA form. Refused CT. Dr. Nicanor Alcon aware.

## 2021-05-10 NOTE — ED Notes (Signed)
Patient told RN she hit her head when she fell. RN told Dr. Nicanor Alcon. Doc ordered a head CT. RN told patient we are waiting on the head CT scan. Patient is refusing saying she cannot afford this. Dr. Nicanor Alcon notified and at bedside talking to patient about her Medicaid covering it. Patient still refusing CT scan.

## 2021-07-01 ENCOUNTER — Other Ambulatory Visit: Payer: Self-pay

## 2021-07-01 ENCOUNTER — Emergency Department (HOSPITAL_COMMUNITY)
Admission: EM | Admit: 2021-07-01 | Discharge: 2021-07-01 | Disposition: A | Discharge status: Home / Self Care | Payer: Self-pay | Attending: Emergency Medicine | Admitting: Emergency Medicine

## 2021-07-01 DIAGNOSIS — Z5321 Procedure and treatment not carried out due to patient leaving prior to being seen by health care provider: Secondary | ICD-10-CM | POA: Insufficient documentation

## 2021-07-01 DIAGNOSIS — F99 Mental disorder, not otherwise specified: Secondary | ICD-10-CM | POA: Insufficient documentation

## 2021-07-01 DIAGNOSIS — R443 Hallucinations, unspecified: Secondary | ICD-10-CM | POA: Insufficient documentation
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
## 2021-07-01 NOTE — ED Notes (Signed)
Patient yanked her vitals equipment off and just jump and left the department.  Security made aware but she is not here under IVC.

## 2021-07-13 ENCOUNTER — Emergency Department (HOSPITAL_COMMUNITY)
Admission: EM | Admit: 2021-07-13 | Discharge: 2021-07-16 | Disposition: A | Discharge status: Home / Self Care | Payer: Self-pay | Attending: Emergency Medicine | Admitting: Emergency Medicine

## 2021-07-13 ENCOUNTER — Other Ambulatory Visit: Payer: Self-pay

## 2021-07-13 ENCOUNTER — Encounter (HOSPITAL_COMMUNITY): Payer: Self-pay

## 2021-07-13 DIAGNOSIS — Z20822 Contact with and (suspected) exposure to covid-19: Secondary | ICD-10-CM | POA: Insufficient documentation

## 2021-07-13 DIAGNOSIS — F209 Schizophrenia, unspecified: Secondary | ICD-10-CM | POA: Insufficient documentation

## 2021-07-13 DIAGNOSIS — F22 Delusional disorders: Secondary | ICD-10-CM | POA: Insufficient documentation

## 2021-07-13 LAB — RAPID URINE DRUG SCREEN, HOSP PERFORMED
Amphetamines: NOT DETECTED
Barbiturates: NOT DETECTED
Benzodiazepines: NOT DETECTED
Cocaine: NOT DETECTED
Opiates: NOT DETECTED
Tetrahydrocannabinol: NOT DETECTED

## 2021-07-13 LAB — COMPREHENSIVE METABOLIC PANEL
ALT: 15 U/L (ref 0–44)
AST: 22 U/L (ref 15–41)
Albumin: 4 g/dL (ref 3.5–5.0)
Alkaline Phosphatase: 58 U/L (ref 38–126)
Anion gap: 11 (ref 5–15)
BUN: 10 mg/dL (ref 6–20)
CO2: 25 mmol/L (ref 22–32)
Calcium: 9.4 mg/dL (ref 8.9–10.3)
Chloride: 106 mmol/L (ref 98–111)
Creatinine, Ser: 0.83 mg/dL (ref 0.44–1.00)
GFR, Estimated: >60 mL/min (ref >60)
Glucose, Bld: 76 mg/dL (ref 70–99)
Potassium: 3.3 mmol/L — ABNORMAL LOW (ref 3.5–5.1)
Sodium: 142 mmol/L (ref 135–145)
Total Bilirubin: 0.6 mg/dL (ref 0.3–1.2)
Total Protein: 7.4 g/dL (ref 6.5–8.1)

## 2021-07-13 LAB — CBC WITH DIFFERENTIAL/PLATELET
Abs Immature Granulocytes: 0.01 10*3/uL (ref 0.00–0.07)
Basophils Absolute: 0 10*3/uL (ref 0.0–0.1)
Basophils Relative: 1 %
Eosinophils Absolute: 0.2 10*3/uL (ref 0.0–0.5)
Eosinophils Relative: 4 %
HCT: 32.7 % — ABNORMAL LOW (ref 36.0–46.0)
Hemoglobin: 9.9 g/dL — ABNORMAL LOW (ref 12.0–15.0)
Immature Granulocytes: 0 %
Lymphocytes Relative: 46 %
Lymphs Abs: 2.1 10*3/uL (ref 0.7–4.0)
MCH: 26.2 pg (ref 26.0–34.0)
MCHC: 30.3 g/dL (ref 30.0–36.0)
MCV: 86.5 fL (ref 80.0–100.0)
Monocytes Absolute: 0.4 10*3/uL (ref 0.1–1.0)
Monocytes Relative: 8 %
Neutro Abs: 1.9 10*3/uL (ref 1.7–7.7)
Neutrophils Relative %: 41 %
Platelets: 328 10*3/uL (ref 150–400)
RBC: 3.78 MIL/uL — ABNORMAL LOW (ref 3.87–5.11)
RDW: 18.1 % — ABNORMAL HIGH (ref 11.5–15.5)
WBC: 4.6 10*3/uL (ref 4.0–10.5)
nRBC: 0 % (ref 0.0–0.2)

## 2021-07-13 LAB — RESP PANEL BY RT-PCR (FLU A&B, COVID) ARPGX2
Influenza A by PCR: NEGATIVE
Influenza B by PCR: NEGATIVE
SARS Coronavirus 2 by RT PCR: NEGATIVE

## 2021-07-13 LAB — PREGNANCY, URINE: Preg Test, Ur: NEGATIVE

## 2021-07-13 LAB — I-STAT BETA HCG BLOOD, ED (MC, WL, AP ONLY): I-stat hCG, quantitative: 6.5 m[IU]/mL — ABNORMAL HIGH (ref <5)

## 2021-07-13 LAB — ETHANOL: Alcohol, Ethyl (B): <10 mg/dL (ref <10)

## 2021-07-13 MED ORDER — ARIPIPRAZOLE 5 MG PO TABS
5.0000 mg | ORAL_TABLET | Freq: Every day | ORAL | Status: DC
  Administered 2021-07-13 – 2021-07-16 (×3): 5 mg via ORAL
  Filled 2021-07-13 (×3): qty 1

## 2021-07-13 MED ORDER — POTASSIUM CHLORIDE CRYS ER 20 MEQ PO TBCR
40.0000 meq | EXTENDED_RELEASE_TABLET | Freq: Once | ORAL | Status: AC
  Administered 2021-07-13: 40 meq via ORAL
  Filled 2021-07-13: qty 2

## 2021-07-13 MED ORDER — TRAZODONE HCL 50 MG PO TABS: 50.0000 mg | ORAL_TABLET | Freq: Every evening | ORAL | Status: DC | PRN

## 2021-07-13 MED ORDER — ACETAMINOPHEN 325 MG PO TABS: 650.0000 mg | ORAL_TABLET | ORAL | Status: DC | PRN

## 2021-07-13 MED ORDER — HYDROXYZINE HCL 25 MG PO TABS: 25.0000 mg | ORAL_TABLET | Freq: Three times a day (TID) | ORAL | Status: DC | PRN
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
## 2021-07-13 NOTE — ED Notes (Signed)
Patient calm and cooperative. Pt sitting on bed watching tv.
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
## 2021-07-13 NOTE — BH Assessment (Addendum)
Disposition Note:   Dolby NP, recommends a inpatient admission to assist with stabilization. @2244 , [facility] , RN) confirmed no [facility] bed availability.  Patient faxed out to multiple hospitals for consideration of bed placement.   Destination Service Provider Selected Services Address Phone Fax  [facility]  N/A [address], [place] [phone]  [facility]  N/A [address], [place] [phone]  [facility]  N/A [address] [place], [place] [phone]  [facility]  N/A [address] [place] [phone]  [facility]  N/A [phone] [place] [phone]  [facility]  N/A [address] [place], [place] [phone]  [facility]  N/A [address], [place] [phone]  [facility]  N/A [address] [place] [address] [place] [phone]  [facility]  N/A [address], [place] [phone]  [facility]  N/A [address] [place] [phone]  [facility]  N/A [address] [place] [phone]  [facility]  N/A [address] [place] [phone]  [facility]  N/A [address], [place] [phone]  [facility]  N/A [address] [place] [phone]  [facility]  N/A [address] [place] [phone]  [facility]  N/A [address], [place]  [place] [phone]  [facility]  N/A [address] [place] [phone]  [facility]  N/A [address] [place], [place] [phone]  [facility]  N/A [address] [place] [phone]  [facility]  N/A [address] [place] [phone]
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
## 2021-07-13 NOTE — Consult Note (Signed)
Patient reviewed with TTS counselor after assessment. Patient has been off of medications. Chart reviewed. Per 10/10/20 admission to [facility].  Abilify 5 mg PO QD for mood  Hydroxyzine 25 mg PO TID prn anxiety Trazodone 50 mg PO Q HS prn sleep  EKG for 07/14/21 to monitor QTc  Recommend inpatient care with daily assessment for medication management and stabilization.
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
## 2021-07-14 NOTE — ED Notes (Signed)
Pt Is in the room talking to herself.

## 2021-07-14 NOTE — ED Notes (Signed)
Pt agitated. Talking to self. Yelling.

## 2021-07-14 NOTE — BH Assessment (Addendum)
Disposition Note:   2159@ , requested the [facility] Rayfield Citizen, RN) to review patient for a potential bed admission to [facility]. @2214 , no beds available tonight, per [facility]. Therefore, patient faxed out to the following facilities for consideration of bed placement.   Destination Service Provider Selected Services Address Phone Fax  [facility]  N/A [address], [place] [phone]  [facility]  N/A [address], [place] [phone]  [facility]  N/A [address] [place], [place] [phone]  [facility]  N/A [address] [place] [phone]  [facility]  N/A [phone] [address] [place] [phone]  [facility]  N/A [address] [place], [place] [phone]  [facility]  N/A [address], [place] [phone]  [facility]  N/A [address] [place] [address] [place] [phone]  [facility]  N/A [address], [place] [phone]  [facility]  N/A [address] [place] [phone]  [facility]  N/A [address] [place] [phone]  [facility]  N/A [address], [place] [phone]  [facility]  N/A [address], [place] [phone]  [facility]  N/A [address], [place] [phone]  [facility]  N/A [address], [place] [phone]  [facility]  N/A  [address], [place] [phone]  [facility]  N/A [address] [place] [phone]  [facility]  N/A [address] [place], [place] [phone]  [facility]  N/A [address] [place] [phone]  [facility]  N/A [address] [place] [phone]

## 2021-07-14 NOTE — ED Notes (Signed)
Dinner tray given

## 2021-07-14 NOTE — ED Provider Notes (Signed)
Emergency Medicine Observation Re-evaluation Note  Stephanie [name] is a 45 y.o. female, seen on rounds today.  Pt initially presented to the ED for complaints of Mental Health Problem Currently, the patient is awaiting placement.  Physical Exam  BP 112/64 (BP Location: Left Arm)   Pulse (!) 51   Temp 98 F (36.7 C) (Oral)   Resp 18   SpO2 99%  Physical Exam General: Resting comfortably with no complaints Cardiac: No murmur Lungs: Clear Psych: Resting  ED Course / MDM  EKG:   I have reviewed the labs performed to date as well as medications administered while in observation.  Recent changes in the last 24 hours include had 1 episode nausea and vomiting this morning after eating breakfast.  Currently being monitored by nursing.  No other complaints or problems overnight  Plan  Current plan is for awaiting placement. Stephanie [name] is under involuntary commitment.      Stephanie [name], Canary Brim, MD 07/14/21 1101

## 2021-07-14 NOTE — ED Notes (Signed)
Pt alert . Cooperative. Disorganized.  Guarded. Medication compliant

## 2021-07-14 NOTE — ED Notes (Signed)
Patient calm and cooperative this shift.  

## 2021-07-15 NOTE — ED Provider Notes (Signed)
Emergency Medicine Observation Re-evaluation Note  Stephanie [name] is a 45 y.o. female, seen on rounds today.  Pt initially presented to the ED for complaints of Mental Health Problem Currently, the patient is awaiting placement.  Physical Exam  BP 118/80 (BP Location: Right Arm)   Pulse (!) 51   Temp 98.5 F (36.9 C) (Oral)   Resp 18   SpO2 100%  Physical Exam General: Asleep, resting comfortably Lungs: Respirations even and unlabored Psych: Resting  ED Course / MDM  EKG:EKG Interpretation  Date/Time:  Monday July 13 2021 14:19:40 EDT Ventricular Rate:  50 PR Interval:  136 QRS Duration: 78 QT Interval:  452 QTC Calculation: 412 R Axis:   80 Text Interpretation: Sinus bradycardia Otherwise normal ECG No significant change since last tracing Confirmed by Lorre Nick ([id number]) on 07/13/2021 3:23:49 PM Also confirmed by Lorre Nick ([id number]), editor Elita Quick ([id number])  on 07/14/2021 2:24:10 PM  I have reviewed the labs performed to date as well as medications administered while in observation.  Recent changes in the last 24 hours include had 1 episode nausea and vomiting this morning after eating breakfast.  Currently being monitored by nursing.  No other complaints or problems overnight  Plan  Current plan is for awaiting placement. Stephanie [name] is under involuntary commitment.         Ernie Avena, MD 07/15/21 [phone]

## 2021-07-16 ENCOUNTER — Inpatient Hospital Stay (HOSPITAL_COMMUNITY)
Admission: AD | Admit: 2021-07-16 | Discharge: 2021-07-29 | DRG: 885 | Disposition: A | Discharge status: Home / Self Care | Payer: Federal, State, Local not specified - Other | Source: Intra-hospital | Attending: Psychiatry | Admitting: Psychiatry

## 2021-07-16 DIAGNOSIS — E781 Pure hyperglyceridemia: Secondary | ICD-10-CM | POA: Diagnosis present

## 2021-07-16 DIAGNOSIS — Z888 Allergy status to other drugs, medicaments and biological substances status: Secondary | ICD-10-CM

## 2021-07-16 DIAGNOSIS — Z880 Allergy status to penicillin: Secondary | ICD-10-CM

## 2021-07-16 DIAGNOSIS — D649 Anemia, unspecified: Secondary | ICD-10-CM | POA: Diagnosis present

## 2021-07-16 DIAGNOSIS — Z818 Family history of other mental and behavioral disorders: Secondary | ICD-10-CM

## 2021-07-16 DIAGNOSIS — F209 Schizophrenia, unspecified: Principal | ICD-10-CM | POA: Diagnosis present

## 2021-07-16 DIAGNOSIS — K5900 Constipation, unspecified: Secondary | ICD-10-CM | POA: Diagnosis present

## 2021-07-16 DIAGNOSIS — F32A Depression, unspecified: Secondary | ICD-10-CM | POA: Diagnosis present

## 2021-07-16 DIAGNOSIS — U071 COVID-19: Secondary | ICD-10-CM | POA: Diagnosis present

## 2021-07-16 DIAGNOSIS — Z9114 Patient's other noncompliance with medication regimen: Secondary | ICD-10-CM

## 2021-07-16 DIAGNOSIS — Z79899 Other long term (current) drug therapy: Secondary | ICD-10-CM | POA: Diagnosis not present

## 2021-07-16 DIAGNOSIS — E876 Hypokalemia: Secondary | ICD-10-CM | POA: Diagnosis not present

## 2021-07-16 DIAGNOSIS — E785 Hyperlipidemia, unspecified: Secondary | ICD-10-CM | POA: Diagnosis present

## 2021-07-16 DIAGNOSIS — F419 Anxiety disorder, unspecified: Secondary | ICD-10-CM | POA: Diagnosis present

## 2021-07-16 DIAGNOSIS — G4700 Insomnia, unspecified: Secondary | ICD-10-CM | POA: Diagnosis present

## 2021-07-16 DIAGNOSIS — F200 Paranoid schizophrenia: Secondary | ICD-10-CM | POA: Diagnosis not present

## 2021-07-16 LAB — RESP PANEL BY RT-PCR (FLU A&B, COVID) ARPGX2
Influenza A by PCR: NEGATIVE
Influenza B by PCR: NEGATIVE
SARS Coronavirus 2 by RT PCR: NEGATIVE

## 2021-07-16 LAB — I-STAT BETA HCG BLOOD, ED (MC, WL, AP ONLY): I-stat hCG, quantitative: <5 m[IU]/mL (ref <5)

## 2021-07-16 MED ORDER — ARIPIPRAZOLE 10 MG PO TABS
10.0000 mg | ORAL_TABLET | Freq: Every day | ORAL | Status: DC
  Administered 2021-07-17 – 2021-07-22 (×6): 10 mg via ORAL
  Filled 2021-07-16 (×9): qty 1

## 2021-07-16 MED ORDER — TRAZODONE HCL 50 MG PO TABS
50.0000 mg | ORAL_TABLET | Freq: Every evening | ORAL | Status: DC | PRN
  Administered 2021-07-17 – 2021-07-22 (×6): 50 mg via ORAL
  Filled 2021-07-16: qty 1
  Filled 2021-07-16: qty 7
  Filled 2021-07-16 (×9): qty 1

## 2021-07-16 MED ORDER — ACETAMINOPHEN 325 MG PO TABS
650.0000 mg | ORAL_TABLET | Freq: Four times a day (QID) | ORAL | Status: DC | PRN
  Administered 2021-07-17 – 2021-07-28 (×3): 650 mg via ORAL
  Filled 2021-07-16 (×3): qty 2

## 2021-07-16 MED ORDER — ALUM & MAG HYDROXIDE-SIMETH 200-200-20 MG/5ML PO SUSP: 30.0000 mL | ORAL | Status: DC | PRN

## 2021-07-16 MED ORDER — HYDROXYZINE HCL 25 MG PO TABS
25.0000 mg | ORAL_TABLET | Freq: Three times a day (TID) | ORAL | Status: DC | PRN
  Administered 2021-07-21: 25 mg via ORAL
  Filled 2021-07-16: qty 1
  Filled 2021-07-16: qty 10
  Filled 2021-07-16: qty 1

## 2021-07-16 MED ORDER — MAGNESIUM HYDROXIDE 400 MG/5ML PO SUSP: 30.0000 mL | Freq: Every day | ORAL | Status: DC | PRN

## 2021-07-16 MED ORDER — ARIPIPRAZOLE 5 MG PO TABS
5.0000 mg | ORAL_TABLET | Freq: Once | ORAL | Status: AC
  Administered 2021-07-16: 5 mg via ORAL
  Filled 2021-07-16: qty 1

## 2021-07-16 NOTE — Consult Note (Signed)
Chart reviewed.   EKG 8/22 QT/QTc  452/412  EKG 07/16/21 QT/QTc 420/401  Abilify 5 mg PO x 1 dose ordered. Will start Abilify 10 mg PO QD 07/17/21- for mood  EKG ordered 07/17/21  Patient will be admitted to [facility] later today.

## 2021-07-16 NOTE — ED Provider Notes (Signed)
Emergency Medicine Observation Re-evaluation Note  Stephanie [name] is a 45 y.o. female, seen on rounds today.  Pt initially presented to the ED for complaints of Mental Health Problem Currently, the patient is resting comfortably.  Physical Exam  BP 129/69 (BP Location: Right Arm)   Pulse (!) 50   Temp 97.9 F (36.6 C) (Oral)   Resp 14   SpO2 99%  Physical Exam General: resting comfortably, NAD Lungs: normal WOB Psych: currently calm and resting  ED Course / MDM  EKG:EKG Interpretation  Date/Time:  Monday July 13 2021 14:19:40 EDT Ventricular Rate:  50 PR Interval:  136 QRS Duration: 78 QT Interval:  452 QTC Calculation: 412 R Axis:   80 Text Interpretation: Sinus bradycardia Otherwise normal ECG No significant change since last tracing Confirmed by Lorre Nick ([id number]) on 07/13/2021 3:23:49 PM Also confirmed by Lorre Nick ([id number]), editor Elita Quick ([id number])  on 07/14/2021 2:24:10 PM  I have reviewed the labs performed to date as well as medications administered while in observation.  Recent changes in the last 24 hours include none reported.  Plan  Current plan is for psychiatric placement. Kalecia Hartney [place] is under involuntary commitment.      Stephanie [name], [place] 07/16/21 [phone]

## 2021-07-16 NOTE — ED Notes (Signed)
Patient calm and cooperative this shift.  
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
## 2021-07-16 NOTE — ED Notes (Signed)
Pt alert this shift. Cooperative. Medication compliant. Guarded. Denied SI/HI. Pt ambulatory and can complete all ADLs.Marland Kitchen

## 2021-07-16 NOTE — BH Assessment (Signed)
Re-Assessment 07/16/2021:  Clinician re-assessed patient on this day. However, prior to assessing patient, completed a chart review. The chart review indicates the following information: "[employer] called out for disturbance. Patient was destroying her home and kitchen cabinets. Patient had pulled the kitchen cabinets doors of the wall. When [employer] arrived and her washing machine was sitting out front her door. When [employer] arrived, patient was calm. Patient states she has not been taking her medication."  Clinician met with patient via tele assessment. She states that reason for her admission being, "Someone at [employer] wanted me to have a mental health evaluation. It's because I  tore the blinds down at my apartment".   Clinician discussed with patient her reason for destroying property in her apartment. Patient states that  this is the only way she knew to get the case worker Product/process development scientist) attention. Patient upset that "Lavella Lemons" does not come by and bring her food. Therefore, patient knows that destroying her apartment will get her the attention, food, toiletries that she needs. Patient tells this Clinician that if she destroys her property and/or acts on purpose, police will be called, and she will be brought to the emergency department, admitted to [facility], and given food and the toiletries that she needs.   States that she has been psychiatrically admitted in the past at [facility] with the same intentions as noted above, food and toiletry. She indicates that she has never been admitted to [facility] for psychiatric reasons because she doesn't have any mental health diagnosis. Upon chart review patient was admitted to [facility] on 10/10/2020, 03/25/2020, and 07/13/2021. She tells this Clinician that she is well known to the staff on the unit and this is where she will go to get the things that she needs, such as food.  Patient denies current suicidal ideations. Denies prior history of suicide attempts and/or getures. Denies  depressive and anxiety related symptoms. Denies homicidal ideations. Denies AVH's. Denies alcohol and/or drug use. Patient identifies her support system as "Myself, God, Christ, and the [place]". She asked about appetite and indicates that she has Anorexia and Bulimia. States that she has been eating everything given while in the Emergency Department. Also, purging her food stating she did this during breakfast this morning followed by involuntarily throwing up.    During assessment, Pt presented as alert and oriented.  He had good eye contact and was initially cooperative. However, towards the end of the assessment because angry and uncooperative. Also, suspicious.   Demeanor was restless and preoccupied.  Pt was dressed in scrubs and appropriately groomed.  Pt's mood was initially calm. Yet, she became irritable/angry toward the end of her assessment.  Affect was preoccupied.  Pt's speech was normal in rate, rhythm, and volume.  Thought processes were within normal range, and thought content suggested paranoid delusion.  Thought organization was circumstantial.  Memory and concentration were fair.  Insight and judgment were fair.  Impulse control was fair.   Disposition: Per Pecolia Ades, NP, patient continues to meet inpatient psychiatric treatment. Disposition Counselor/LCSW to seek appropriate placement. EDP, nursing, Charge Nurse, and Disposition Counselor provided disposition updates.

## 2021-07-16 NOTE — ED Notes (Signed)
Lunch tray given. 

## 2021-07-16 NOTE — BH Assessment (Signed)
[facility] Assessment Progress Note   Per Dorena Bodo, NP, this pt continues to require psychiatric hospitalization.  Linsey, RN, [facility] has tentativley assigned pt to [facility] Rm 407-1 to the service of Nelly Rout, MD, however, repeat findings will first be needed for hCG and Covid-19.  Target time for transfer will be 20:30.  Pt presents under IVC initiated by EDP Lorre Nick, MD and IVC documents have been sent  to [facility].  EDP Kristine Royal, MD and pt's nurse, Waynetta Sandy, have been notified, and Waynetta Sandy agrees to call report to [phone].  Pt is to be transported via Patent examiner.   Doylene Canning, [place] [facility] Coordinator [phone]

## 2021-07-17 ENCOUNTER — Encounter (HOSPITAL_COMMUNITY): Payer: Self-pay | Admitting: Family Medicine

## 2021-07-17 ENCOUNTER — Other Ambulatory Visit: Payer: Self-pay

## 2021-07-17 DIAGNOSIS — F200 Paranoid schizophrenia: Secondary | ICD-10-CM | POA: Diagnosis not present

## 2021-07-17 LAB — TSH: TSH: 3.122 u[IU]/mL (ref 0.350–4.500)

## 2021-07-17 LAB — LIPID PANEL
Cholesterol: 224 mg/dL — ABNORMAL HIGH (ref 0–200)
HDL: 48 mg/dL (ref >40)
LDL Cholesterol: 103 mg/dL — ABNORMAL HIGH (ref 0–99)
Total CHOL/HDL Ratio: 4.7 RATIO
Triglycerides: 364 mg/dL — ABNORMAL HIGH (ref <150)
VLDL: 73 mg/dL — ABNORMAL HIGH (ref 0–40)

## 2021-07-17 MED ORDER — DIPHENHYDRAMINE HCL 25 MG PO CAPS
ORAL_CAPSULE | ORAL | Status: AC
  Filled 2021-07-17: qty 1

## 2021-07-17 MED ORDER — DIPHENHYDRAMINE HCL 25 MG PO CAPS
25.0000 mg | ORAL_CAPSULE | Freq: Three times a day (TID) | ORAL | Status: DC | PRN
  Administered 2021-07-17: 25 mg via ORAL

## 2021-07-17 MED ORDER — POTASSIUM CHLORIDE CRYS ER 20 MEQ PO TBCR
20.0000 meq | EXTENDED_RELEASE_TABLET | Freq: Two times a day (BID) | ORAL | Status: AC
  Administered 2021-07-17 – 2021-07-20 (×6): 20 meq via ORAL
  Filled 2021-07-17 (×7): qty 1

## 2021-07-17 MED ORDER — LORAZEPAM 2 MG/ML IJ SOLN: 1.0000 mg | Freq: Three times a day (TID) | INTRAMUSCULAR | Status: DC | PRN

## 2021-07-17 MED ORDER — POLYETHYLENE GLYCOL 3350 17 G PO PACK
17.0000 g | PACK | Freq: Every day | ORAL | Status: DC
  Administered 2021-07-18 – 2021-07-24 (×6): 17 g via ORAL
  Filled 2021-07-17 (×9): qty 1

## 2021-07-17 MED ORDER — HALOPERIDOL LACTATE 5 MG/ML IJ SOLN: 5.0000 mg | Freq: Three times a day (TID) | INTRAMUSCULAR | Status: DC | PRN

## 2021-07-17 MED ORDER — HALOPERIDOL 5 MG PO TABS: 5.0000 mg | ORAL_TABLET | Freq: Three times a day (TID) | ORAL | Status: DC | PRN

## 2021-07-17 MED ORDER — LORAZEPAM 1 MG PO TABS
ORAL_TABLET | ORAL | Status: AC
  Filled 2021-07-17: qty 1

## 2021-07-17 MED ORDER — HALOPERIDOL 5 MG PO TABS
ORAL_TABLET | ORAL | Status: AC
  Filled 2021-07-17: qty 1

## 2021-07-17 MED ORDER — FERROUS SULFATE 325 (65 FE) MG PO TABS
325.0000 mg | ORAL_TABLET | Freq: Two times a day (BID) | ORAL | Status: DC
  Administered 2021-07-17 – 2021-07-21 (×9): 325 mg via ORAL
  Filled 2021-07-17 (×13): qty 1

## 2021-07-17 MED ORDER — LORAZEPAM 1 MG PO TABS
1.0000 mg | ORAL_TABLET | Freq: Three times a day (TID) | ORAL | Status: DC | PRN
  Administered 2021-07-17: 1 mg via ORAL

## 2021-07-17 MED ORDER — DIPHENHYDRAMINE HCL 50 MG/ML IJ SOLN: 25.0000 mg | Freq: Four times a day (QID) | INTRAMUSCULAR | Status: DC | PRN

## 2021-07-17 NOTE — Progress Notes (Signed)
Dar Note:Patient presents with anxious affect and mood.  Observed pacing the unit and asking to leave.  Ativan and Benadryl given for agitation.  Patient transferred to 500 hall for safety.  Routine safety checks maintained.  Patient offered support and encouragement as needed.  Patient is safe on the unit.

## 2021-07-17 NOTE — BHH Suicide Risk Assessment (Signed)
[facility] Admission Suicide Risk Assessment   Nursing information obtained from:  Patient Demographic factors:  Low socioeconomic status, Living alone, Unemployed Current Mental Status:  NA Loss Factors:  Financial problems / change in socioeconomic status, Decrease in vocational status Historical Factors:  NA Risk Reduction Factors:  NA  Total Time spent with patient: 1 hour Principal Problem: Schizophrenia (HCC) Diagnosis:  Principal Problem:   Schizophrenia (HCC)  Second opinion for involuntary commitment completed.  Subjective Data: "I came here for a mental health evaluation."  History of Present Illness: This is one of several psychiatric admission assessments from this [facility] for this 45 year old AA female with chronic mental health issues, multiple psychiatric admissions & non-complaint to her treatment regimen. She was a patient in this [facility] last November, 2021. After that admission/mood stabilization, she was discharged on medication with a referral & an appointment for an outpatient psychiatric  care/medication management. She is being admitted to the [facility] this time around from the [facility] for evaluation/treatment after her landlord called the cops reporting disturbances on the part of this patient.  Patient was apparently found destroying her home and kitchen cabinets. Patient had apparently pulled multiple cabinets out of the wall and had pushed her washing machine out her front door.  She had also pulled a receptacle for the dryer out of the wall.  Patient reported that she was "remodeling her apartment".  Her UDS was negative of all substances & BAL was < 10. Reports also indicated that patient was neglecting her personal hygiene. During this evaluation, Stephanie [name] reports,  "I was taken to the [facility] ED on the 22nd of this month by Ms. Stephanie [name] from the [facility].. They wanted me to come to the hospital for mental health evaluation because I tore my apartment  up a week ago. I tore it up because I was sick of living there. I have been living in this apartment for almost a year. My apartment is not in the right location or neighborhood. I was told the last time I was in this hospital that I had mental health issues., but I seek a second opinion in [place]. I was informed by a psychiatrist over there that I did not have any mental health issues, rather that I had eating disorder. They told me to just eat & I will be healed. I have been doing well mentally. I have not been taking any medications for a while & I do not want to be on any medications or drugs. I was recently jailed for one week in [facility] for trespassing. When I got released, they put me on probation. My probation was transferred to [place] when I moved up there. I have completed the probation. I'm not depressed or anxious. I sleep well. No voices or seeing things. I just have a headache, the nurse has given me some tylenol already. Just to let you know, I'm allergic to Tylenol & penicillin".   Objective: Stephanie [name] presents disheveled, making a fair eye contact. She is verbally responsive. Although as much disorganized & disheveled as she presents, she seem to be a fairly good historian. She presents with foul body odor. There are no behavioral issues noted.   Associated Signs/Symptoms:   Depression Symptoms:  insomnia, difficulty concentrating, anxiety,   (Hypo) Manic Symptoms:  Delusions, Distractibility, Impulsivity, Irritable Mood, Lability of Mood,   Anxiety Symptoms:  Excessive Worry,   Psychotic Symptoms:  Delusions, Paranoia,   PTSD Symptoms: Negative  Past Psychiatric History: The patient's last hospitalization in our facility was on 10-10-2020 thru 10-15-21, 03/31/2019. Has been in a psychiatric inpatient hospitalization in [place].  She has had multiple emergency room visits.  All the notes seem to suggest that she is relatively calm in the emergency departments  with delusional thinking.  Her discharge medications from our facility in 2021 include; Abilify, Tegretol, Vistaril, Risperdal & Trazodone.  Was Risperdal and the long-acting Abilify injection.   Is the patient at risk to self? No. Patient denies. Has the patient been a risk to self in the past 6 months? Yes.    Has the patient been a risk to self within the distant past? Yes.    Is the patient a risk to others? No.  Has the patient been a risk to others in the past 6 months? No.  Has the patient been a risk to others within the distant past? No.    Prior Inpatient Therapy: Yes, [facility] x multiple times. Prior Outpatient Therapy: Yes  Continued Clinical Symptoms:  Alcohol Use Disorder Identification Test Final Score (AUDIT): 0 The "Alcohol Use Disorders Identification Test", Guidelines for Use in Primary Care, Second Edition.  World Science writer [facility]). Score between 0-7:  no or low risk or alcohol related problems. Score between 8-15:  moderate risk of alcohol related problems. Score between 16-19:  high risk of alcohol related problems. Score 20 or above:  warrants further diagnostic evaluation for alcohol dependence and treatment.   CLINICAL FACTORS:   Severe Anxiety and/or Agitation Schizophrenia:   Paranoid or undifferentiated type Currently Psychotic Previous Psychiatric Diagnoses and Treatments   Musculoskeletal: Strength & Muscle Tone: within normal limits Gait & Station: normal Patient leans: N/A  Psychiatric Specialty Exam:  Presentation  General Appearance: Disheveled; Bizarre  Eye Contact:Poor  Speech:Blocked; Garbled  Speech Volume:Decreased  Handedness:Right   Mood and Affect  Mood:Irritable  Affect:Restricted   Thought Process  Thought Processes:Disorganized  Descriptions of Associations:Tangential  Orientation:Partial  Thought Content:Illogical; Paranoid Ideation; Delusions  History of Schizophrenia/Schizoaffective  disorder:Yes  Duration of Psychotic Symptoms:Greater than six months  Hallucinations:Hallucinations: Other (comment) (denies) Ideas of Reference:Delusions; Paranoia  Suicidal Thoughts:Suicidal Thoughts: No Homicidal Thoughts:Homicidal Thoughts: No  Sensorium  Memory:Immediate Poor; Recent Poor; Remote Poor  Judgment:Impaired  Insight:Lacking   Executive Functions  Concentration:Poor  Attention Span:Poor  Recall:Poor  Fund of Knowledge:Poor  Language:Poor   Psychomotor Activity  Psychomotor Activity: Psychomotor Activity: Decreased  Assets  Assets:Resilience   Sleep  Sleep: Sleep: Poor Number of Hours of Sleep: 5.25   Physical Exam: Physical Exam Vitals and nursing note reviewed.  Constitutional:      Appearance: She is well-developed.  HENT:     Head: Normocephalic and atraumatic.     Nose: Nose normal.     Mouth/Throat:     Pharynx: Oropharynx is clear.  Eyes:     Pupils: Pupils are equal, round, and reactive to light.  Cardiovascular:     Rate and Rhythm: Normal rate.     Pulses: Normal pulses.  Pulmonary:     Effort: Pulmonary effort is normal.  Genitourinary:    Comments: Deferred Musculoskeletal:     Cervical back: Normal range of motion.  Skin:    General: Skin is warm and dry.  Neurological:     General: No focal deficit present.     Mental Status: She is alert and oriented to person, place, and time.  Deferred  Review of Systems  Constitutional:  Negative for chills, diaphoresis and fever.  HENT:  Negative for congestion, rhinorrhea, sneezing and sore throat.   Respiratory:  Negative for cough, chest tightness, shortness of breath and wheezing.   Cardiovascular:  Negative for chest pain and palpitations.  Gastrointestinal:  Negative for diarrhea, nausea and vomiting.  Genitourinary:  Negative for difficulty urinating.  Musculoskeletal:  Negative for arthralgias and myalgias.  Skin: Negative.   Allergic/Immunologic: Negative for  environmental allergies, food allergies and immunocompromised state.       Allergies: PCH, Haldol   Neurological:  Positive for headaches. Negative for dizziness, tremors, seizures, syncope, facial asymmetry, speech difficulty, weakness, light-headedness and numbness.  Psychiatric/Behavioral:  Positive for dysphoric mood. Negative for agitation (hx of), behavioral problems, confusion, decreased concentration, hallucinations, self-injury, sleep disturbance and suicidal ideas. The patient is not nervous/anxious and is not hyperactive.     Blood pressure 111/64, pulse 89, temperature 98.6 F (37 C), temperature source Oral, resp. rate 16, height 4\' 11"  (1.499 m), weight 58.1 kg, SpO2 100 %. Body mass index is 25.85 kg/m.   COGNITIVE FEATURES THAT CONTRIBUTE TO RISK:  Loss of executive function    SUICIDE RISK:   Moderate:  Frequent suicidal ideation with limited intensity, and duration, some specificity in terms of plans, no associated intent, good self-control, limited dysphoria/symptomatology, some risk factors present, and identifiable protective factors, including available and accessible social support.  PLAN OF CARE:  Treatment Plan Summary: Daily contact with patient to assess and evaluate symptoms and progress in treatment and Medication management.    Treatment Plan/Recommendations:  1. Admit for crisis management and stabilization, estimated length of stay 3-5 days.    2. Medication management to reduce current symptoms to base line and improve the patient's overall level of functioning: See MAR, Md's SRA & treatment plan.     Mood control.  Continue Abilify 10 mg po daily (May transition to monthly injectable due to hx of non-compliance).   Anxiety. Continue Vistaril 25 mg po tid prn. Continue Lorazepam 1 mg po/IM Q 8 hrs prn.   Insomnia.  Continue Benadryl 25 mg po Q 8 hours prn for allergies, sleep, agitation/sleep.\  Or Continue Benadryl 25 mg IM Q 6 hours prn for  agitation. Continue Trazodone 50 mg po Q hs prn.   Other medical issues: initiated,  Ferrous sulfate 325 mg po bid for anemia. Miralax 17 gm po daily for prevention of constipation. Kdur 20 meq po bid x 3 days for hypokalemia.   Continue all other prn medications for nausea, indigestion, pain & or constipation respectively.   Obtain U/A (clean catch urine).   Encourage group participation. Discharge disposition plan is ongoing.     Observation Level/Precautions:  15 minute checks  Laboratory:   Current lab results reviewed. K 3.3, will obtain urinalysis.  Psychotherapy: Group sessions  Medications: See MAR   Consultations:  As needed  Discharge Concerns: Safety, mood stabilization.  Estimated LOS: 3-5 days  Other: Moved to the 500-hall.     Physician Treatment Plan for Primary Diagnosis: Schizophrenia (HCC) Long Term Goal(s): Improvement in symptoms so as ready for discharge   Short Term Goals: Ability to identify changes in lifestyle to reduce recurrence of condition will improve, Ability to verbalize feelings will improve, Ability to disclose and discuss suicidal ideas, and Ability to demonstrate self-control will improve   Physician Treatment Plan for Secondary Diagnosis: Principal Problem:   Schizophrenia (HCC)   Long Term Goal(s): Improvement in symptoms so as ready for discharge   Short Term Goals: Ability to demonstrate self-control  will improve, Ability to identify and develop effective coping behaviors will improve, and Compliance with prescribed medications will improve    I certify that inpatient services furnished can reasonably be expected to improve the patient's condition.   Mariel Craft, MD 07/17/2021, 4:50 PM

## 2021-07-17 NOTE — Progress Notes (Signed)
Pt has minimal interaction on the unit this evening , pt very guarded and forwards little at this time.pt di say she wanted something to help her sleep, pt given PRN Trazodone per [facility] with medication. Pt encouraged to eat and drink with medications.     07/17/21 2200  Psych Admission Type (Psych Patients Only)  Admission Status Involuntary  Psychosocial Assessment  Patient Complaints Suspiciousness  Eye Contact Fair  Facial Expression Anxious  Affect Anxious;Preoccupied;Sad  Speech Unremarkable  Interaction Defensive  Motor Activity Restless;Slow  Appearance/Hygiene Disheveled;Body odor;Bizarre;Poor hygiene  Behavior Characteristics Guarded  Mood Suspicious  Thought Process  Coherency Circumstantial;Concrete thinking  Content Blaming others  Delusions Referential  Perception Illusions  Hallucination Auditory  Judgment Impaired  Confusion WDL  Danger to Self  Current suicidal ideation? Denies  Danger to Others  Danger to Others None reported or observed

## 2021-07-17 NOTE — [facility]HH Group Notes (Signed)
Patient did not attend group. ° ° °Spirituality group facilitated by Chaplain Katy Bernetta Sutley, [facility].  ° °Group Description: Group focused on topic of hope. Patients participated in facilitated discussion around topic, connecting with one another around experiences and definitions for hope. Group members engaged with visual explorer photos, reflecting on what hope looks like for them today. Group engaged in discussion around how their definitions of hope are present today in hospital.  ° °Modalities: Psycho-social ed, Adlerian, Narrative, MI  ° ° °

## 2021-07-17 NOTE — Plan of Care (Signed)
Patient is newly admitted. Was cooperative with admission and reporting that she neglected herself to attract her case worker's attention "she hasn't seen me in months, she says she has like twenty five people to help....". Patient reported that she has not bathed in a long time and agreed to do it once she gets in her room. However, patient did not bathe: went to bed and has been sleeping.

## 2021-07-17 NOTE — Progress Notes (Signed)
Patient ID: [name], female   DOB: 24-Apr-1976, 45 y.o.   MRN: [id number] Patient presents with history of Schizophrenia and has not been taking medications. Has been experiencing disorganized thinking and behavior at home; destroying the home and neglecting herself. Patient presents with poor hygiene and reports that she has not bathed in many months "because my case worker did not come to see me. Reports that she has no food in the home and has no means to buy it. Patent is disheveled with body odor. She reports that there is nothing wong with her but appears to be preoccupied, responding to internal stimuli. She reports that she has eating disorders (anorexia and Bulimia) and reports that her appetite is good (I eat a lot when I get food". She denies substance use, tobacco use or alcohol use. Patient reports that she has a graduate level degree in engineering but unable to work due to her mental   health condition. She denies SI/HI. She reports no medical issues and skin is intact. Patient reported that she was feeling tired and went to bed. Currently sleeping. Safety precautions initiated.

## 2021-07-17 NOTE — Progress Notes (Signed)
Adult Psychoeducational Group Note  Date:  07/17/2021 Time:  9:39 PM  Group Topic/Focus:  Wrap-Up Group:   The focus of this group is to help patients review their daily goal of treatment and discuss progress on daily workbooks.  Participation Level:  Did Not Attend  Participation Quality:   Not Applicable  Affect:   Not Applicable  Cognitive:   Not Applicable  Insight: None  Engagement in Group:   Not Applicable  Modes of Intervention:   Not Applicable  Additional Comments:    Nicoletta Dress 07/17/2021, 9:39 PM

## 2021-07-17 NOTE — Tx Team (Signed)
Initial Treatment Plan 07/17/2021 2:52 AM Stephanie [name] LFY:[id number]    PATIENT STRESSORS: Financial difficulties Medication change or noncompliance   PATIENT STRENGTHS: Ability for insight Communication skills Physical Health   PATIENT IDENTIFIED PROBLEMS: Disturbed thought process  Anxiety  Restlessness  Self-care deficit               DISCHARGE CRITERIA:  Ability to meet basic life and health needs Improved stabilization in mood, thinking, and/or behavior Motivation to continue treatment in a less acute level of care Verbal commitment to aftercare and medication compliance  PRELIMINARY DISCHARGE PLAN: Attend aftercare/continuing care group Outpatient therapy Return to previous work or school arrangements  PATIENT/FAMILY INVOLVEMENT: This treatment plan has been presented to and reviewed with the patient, Stephanie [name],.  The patient and family have been given the opportunity to ask questions and make suggestions.  Olin Pia, RN 07/17/2021, 2:52 AM

## 2021-07-17 NOTE — [facility] Group Notes (Signed)
[facility] LCSW Group Therapy 07/17/2021 1:15 PM  Type of Therapy: Group Therapy: Boundaries  Description of Group: This group will address the use of boundaries in their personal lives. Patients will explore why boundaries are important, the difference between healthy and unhealthy boundaries, and negative and postive outcomes of different boundaries and will look at how boundaries can be crossed.  Patients will be encouraged to identify current boundaries in their own lives and identify what kind of boundary is being set. Facilitators will guide patients in utilizing problem-solving interventions to address and correct types boundaries being used and to address when no boundary is being used. Understanding and applying boundaries will be explored and addressed for obtaining and maintaining a balanced life. Patients will be encouraged to explore ways to assertively make their boundaries and needs known to significant others in their lives, using other group members and facilitator for role play, support, and feedback.   Therapeutic Goals: 1. Patient will identify areas in their life where setting clear boundaries could be used to improve their life.  2. Patient will identify signs/triggers that a boundary is not being respected. 3. Patient will identify two ways to set boundaries in order to achieve balance in their lives: 4. Patient will demonstrate ability to communicate their needs and set boundaries through discussion and/or role plays  Participation Level: Did Not Attend   Summary of Progress/Problems: Did not attend

## 2021-07-17 NOTE — Tx Team (Signed)
Interdisciplinary Treatment and Diagnostic Plan Update  07/17/2021 Time of Session:  Stephanie [name] MRN: [id number]  Principal Diagnosis: Schizophrenia [facility])  Secondary Diagnoses: Principal Problem:   Schizophrenia ([place])   Current Medications:  Current Facility-Administered Medications  Medication Dose Route Frequency Provider Last Rate Last Admin   acetaminophen (TYLENOL) tablet 650 mg  650 mg Oral Q6H PRN Chalmers Guest, NP   650 mg at 07/17/21 0830   alum & mag hydroxide-simeth (MAALOX/MYLANTA) 200-200-20 MG/5ML suspension 30 mL  30 mL Oral Q4H PRN Chalmers Guest, NP       ARIPiprazole (ABILIFY) tablet 10 mg  10 mg Oral Daily Chalmers Guest, NP   10 mg at 07/17/21 0831   diphenhydrAMINE (BENADRYL) 25 mg capsule            diphenhydrAMINE (BENADRYL) capsule 25 mg  25 mg Oral Q8H PRN Lavella Hammock, MD   25 mg at 07/17/21 1149   Or   diphenhydrAMINE (BENADRYL) injection 25 mg  25 mg Intramuscular Q6H PRN Lavella Hammock, MD       ferrous sulfate tablet 325 mg  325 mg Oral BID WC Nwoko, Agnes I, NP       haloperidol (HALDOL) 5 MG tablet            hydrOXYzine (ATARAX/VISTARIL) tablet 25 mg  25 mg Oral TID PRN Chalmers Guest, NP       LORazepam (ATIVAN) 1 MG tablet            LORazepam (ATIVAN) tablet 1 mg  1 mg Oral Q8H PRN Lavella Hammock, MD   1 mg at 07/17/21 1147   Or   LORazepam (ATIVAN) injection 1 mg  1 mg Intramuscular Q8H PRN Lavella Hammock, MD       magnesium hydroxide (MILK OF MAGNESIA) suspension 30 mL  30 mL Oral Daily PRN Chalmers Guest, NP       polyethylene glycol (MIRALAX / GLYCOLAX) packet 17 g  17 g Oral Daily Nwoko, Agnes I, NP       potassium chloride SA (KLOR-CON) CR tablet 20 mEq  20 mEq Oral BID Lindell Spar I, NP       traZODone (DESYREL) tablet 50 mg  50 mg Oral QHS PRN Chalmers Guest, NP       PTA Medications: Medications Prior to Admission  Medication Sig Dispense Refill Last Dose   acetaminophen (TYLENOL) 325 MG tablet Take 2 tablets (650 mg  total) by mouth every 6 (six) hours as needed for mild pain (or Fever >/= 101). (Patient not taking: No sig reported)       Patient Stressors: Financial difficulties Medication change or noncompliance  Patient Strengths: Ability for insight Communication skills Physical Health  Treatment Modalities: Medication Management, Group therapy, Case management,  1 to 1 session with clinician, Psychoeducation, Recreational therapy.   Physician Treatment Plan for Primary Diagnosis: Schizophrenia ([place]) Long Term Goal(s): Improvement in symptoms so as ready for discharge   Short Term Goals: Ability to demonstrate self-control will improve Ability to identify and develop effective coping behaviors will improve Compliance with prescribed medications will improve Ability to identify changes in lifestyle to reduce recurrence of condition will improve Ability to verbalize feelings will improve Ability to disclose and discuss suicidal ideas  Medication Management: Evaluate patient's response, side effects, and tolerance of medication regimen.  Therapeutic Interventions: 1 to 1 sessions, Unit Group sessions and Medication administration.  Evaluation of Outcomes: Not Met  Physician  Treatment Plan for Secondary Diagnosis: Principal Problem:   Schizophrenia ([place])  Long Term Goal(s): Improvement in symptoms so as ready for discharge   Short Term Goals: Ability to demonstrate self-control will improve Ability to identify and develop effective coping behaviors will improve Compliance with prescribed medications will improve Ability to identify changes in lifestyle to reduce recurrence of condition will improve Ability to verbalize feelings will improve Ability to disclose and discuss suicidal ideas     Medication Management: Evaluate patient's response, side effects, and tolerance of medication regimen.  Therapeutic Interventions: 1 to 1 sessions, Unit Group sessions and Medication  administration.  Evaluation of Outcomes: Not Met   RN Treatment Plan for Primary Diagnosis: Schizophrenia ([place]) Long Term Goal(s): Knowledge of disease and therapeutic regimen to maintain health will improve  Short Term Goals: Ability to demonstrate self-control, Ability to participate in decision making will improve, and Ability to verbalize feelings will improve  Medication Management: RN will administer medications as ordered by provider, will assess and evaluate patient's response and provide education to patient for prescribed medication. RN will report any adverse and/or side effects to prescribing provider.  Therapeutic Interventions: 1 on 1 counseling sessions, Psychoeducation, Medication administration, Evaluate responses to treatment, Monitor vital signs and CBGs as ordered, Perform/monitor CIWA, COWS, AIMS and Fall Risk screenings as ordered, Perform wound care treatments as ordered.  Evaluation of Outcomes: Not Met   LCSW Treatment Plan for Primary Diagnosis: Schizophrenia ([place]) Long Term Goal(s): Safe transition to appropriate next level of care at discharge, Engage patient in therapeutic group addressing interpersonal concerns.  Short Term Goals: Engage patient in aftercare planning with referrals and resources, Increase social support, and Increase ability to appropriately verbalize feelings  Therapeutic Interventions: Assess for all discharge needs, 1 to 1 time with Social worker, Explore available resources and support systems, Assess for adequacy in community support network, Educate family and significant other(s) on suicide prevention, Complete Psychosocial Assessment, Interpersonal group therapy.  Evaluation of Outcomes: Not Met   Progress in Treatment: Attending groups: No. Participating in groups: No. Taking medication as prescribed: Yes. Toleration medication: Yes. Family/Significant other contact made: No, will contact:  will obtain consents Patient  understands diagnosis: No. Discussing patient identified problems/goals with staff: Yes. Medical problems stabilized or resolved: Yes. Denies suicidal/homicidal ideation: Yes. Issues/concerns per patient self-inventory: No. Other: None  New problem(s) identified: No, Describe:  None  New Short Term/Long Term Goal(s):medication stabilization, elimination of SI thoughts, development of comprehensive mental wellness plan.   Patient Goals:  "get to the bathroom before it is too late."   Discharge Plan or Barriers: Patient recently admitted. CSW will continue to follow and assess for appropriate referrals and possible discharge planning.   Reason for Continuation of Hospitalization: Aggression Medication stabilization  Estimated Length of Stay: 3-5 days  Attendees: Patient: Stephanie [name] 07/17/2021 3:24 PM  Physician: Dr. Kai Levins 07/17/2021 3:24 PM  Nursing:  07/17/2021 3:24 PM  RN Care Manager: 07/17/2021 3:24 PM  Social Worker: Toney Reil, [place] 07/17/2021 3:24 PM  Recreational Therapist:  07/17/2021 3:24 PM  Other:  07/17/2021 3:24 PM  Other:  07/17/2021 3:24 PM  Other: 07/17/2021 3:24 PM    Scribe for Treatment Team: Mliss Fritz, [place] 07/17/2021 3:24 PM

## 2021-07-17 NOTE — Progress Notes (Signed)
Pt did not attend group due to sleeping.

## 2021-07-17 NOTE — Progress Notes (Signed)
Recreation Therapy Notes  Date: 8.26.22 Time: 0930 Location: [address] Dayroom  Group Topic: Stress Management   Goal Area(s) Addresses:  Patient will actively participate in stress management techniques presented during session.  Patient will successfully identify benefit of practicing stress management post d/c.   Intervention: Relaxation exercise with ambient sound and script   Activity: Guided Imagery. LRT provided education, instruction, and demonstration on practice of visualization via guided imagery. Patients was asked to participate in the technique introduced during session. Patients were given suggestions of ways to access scripts post d/c and encouraged to explore Youtube and other apps available on smartphones, tablets, and computers.  Education:  Stress Management, Discharge Planning.   Education Outcome: Acknowledges education  Clinical Observations/Feedback: Patient did not attend group session.    Ruffus Kamaka, LRT/CTRS         Melia Hopes A 07/17/2021 10:54 AM 

## 2021-07-17 NOTE — H&P (Signed)
Psychiatric Admission Assessment Adult  Patient Identification: Stephanie [name]  MRN:  [id number]  Date of Evaluation:  07/17/2021  Chief Complaint:  Schizophrenia [facility]) [F20.9]  Principal Diagnosis: Schizophrenia (HCC)  Diagnosis:  Principal Problem:   Schizophrenia (HCC)  History of Present Illness: This is one of several psychiatric admission assessments from this [facility] for this 45 year old AA female with chronic mental health issues, multiple psychiatric admissions & non-complaint to her treatment regimen. She was a patient in this [facility] last November, 2021. After that admission/mood stabilization, she was discharged on medication with a referral & an appointment for an outpatient psychiatric  care/medication management. She is being admitted to the [facility] this time around from the [facility] for evaluation/treatment after her landlord called the cops reporting disturbances on the part of this patient.  Patient was apparently found destroying her home and kitchen cabinets. Patient had apparently pulled multiple cabinets out of the wall and had pushed her washing machine out her front door.  She had also pulled a receptacle for the dryer out of the wall.  Patient reported that she was "remodeling her apartment".  Her UDS was negative of all substances & BAL was < 10. Reports also indicated that patient was neglecting her personal hygiene. During this evaluation, Stephanie [name] reports,  "I was taken to the [facility] ED on the 22nd of this month by Ms. Stephanie [name] from the [facility] They wanted me to come to the hospital for mental health evaluation because I tore my apartment up a week ago. I tore it up because I was sick of living there. I have been living in this apartment for almost a year. My apartment is not in the right location or neighborhood. I was told the last time I was in this hospital that I had mental health issues., but I seek a second opinion in [place]. I was  informed by a psychiatrist over there that I did not have any mental health issues, rather that I had eating disorder. They told me to just eat & I will be healed. I have been doing well mentally. I have not been taking any medications for a while & I do not want to be on any medications or drugs. I was recently jailed for one week in [facility] for trespassing. When I got released, they put me on probation. My probation was transferred to [place] when I moved up there. I have completed the probation. I'm not depressed or anxious. I sleep well. No voices or seeing things. I just have a headache, the nurse has given me some tylenol already. Just to let you know, I'm allergic to Tylenol & penicillin".  Objective: Stephanie [name] presents disheveled, making a fair eye contact. She is verbally responsive. Although as much disorganized & disheveled as she presents, she seem to be a fairly good historian. She presents with foul body odor. There are no behavioral issues noted.  Associated Signs/Symptoms:  Depression Symptoms:  insomnia, difficulty concentrating, anxiety,  (Hypo) Manic Symptoms:  Delusions, Distractibility, Impulsivity, Irritable Mood, Labiality of Mood,  Anxiety Symptoms:  Excessive Worry,  Psychotic Symptoms:  Delusions, Paranoia,  PTSD Symptoms: Negative  Total Time spent with patient: 1 hour  Past Psychiatric History: The patient's last hospitalization in our facility was on 10-10-2020 thru 10-15-21, 03/31/2019. Has been in a psychiatric inpatient hospitalization in [place].  She has had multiple emergency room visits.  All the notes seem to suggest that she is relatively calm  in the emergency departments with delusional thinking.  Her discharge medications from our facility in 2021include; Abilify, Tegretol, Vistaril, Risperdal & Trazodone.  Was Risperdal and the long-acting Abilify injection.  Is the patient at risk to self? No. Patient denies. Has the patient been a risk to  self in the past 6 months? Yes.    Has the patient been a risk to self within the distant past? Yes.    Is the patient a risk to others? No.  Has the patient been a risk to others in the past 6 months? No.  Has the patient been a risk to others within the distant past? No.   Prior Inpatient Therapy: Yes, [facility] x multiple times. Prior Outpatient Therapy: Yes  Alcohol Screening: 1. How often do you have a drink containing alcohol?: Never 2. How many drinks containing alcohol do you have on a typical day when you are drinking?: 1 or 2 3. How often do you have six or more drinks on one occasion?: Never AUDIT-C Score: 0 4. How often during the last year have you found that you were not able to stop drinking once you had started?: Never 5. How often during the last year have you failed to do what was normally expected from you because of drinking?: Never 6. How often during the last year have you needed a first drink in the morning to get yourself going after a heavy drinking session?: Never 7. How often during the last year have you had a feeling of guilt of remorse after drinking?: Never 8. How often during the last year have you been unable to remember what happened the night before because you had been drinking?: Never 9. Have you or someone else been injured as a result of your drinking?: No 10. Has a relative or friend or a doctor or another health worker been concerned about your drinking or suggested you cut down?: No Alcohol Use Disorder Identification Test Final Score (AUDIT): 0  Substance Abuse History in the last 12 months:  No.  Consequences of Substance Abuse: Negative  Previous Psychotropic Medications: Yes   Psychological Evaluations: No   Past Medical History:  Past Medical History:  Diagnosis Date   Schizophrenia (HCC)     Past Surgical History:  Procedure Laterality Date   INDUCED ABORTION     Family History:  Family History  Problem Relation Age of Onset    Cancer Father    Family Psychiatric  History: Yes, per patient's reports. A maternal cousin with hx of mental illness/suicide attempt. Survived.  Tobacco Screening: Says does not smoke  Social History: Single, has no children, unemployed, lives in [place]. Social History   Substance and Sexual Activity  Alcohol Use Never     Social History   Substance and Sexual Activity  Drug Use Never    Additional Social History:  Allergies:   Allergies  Allergen Reactions   Haldol [Haloperidol Lactate] Anaphylaxis   Penicillins Nausea And Vomiting    Did it involve swelling of the face/tongue/throat, SOB, or low BP? No Did it involve sudden or severe rash/hives, skin peeling, or any reaction on the inside of your mouth or nose? No Did you need to seek medical attention at a hospital or doctor's office? No When did it last happen?      unknown  If all above answers are "NO", may proceed with cephalosporin use.    Penicillins Nausea And Vomiting   Lab Results:  Results for orders placed or  performed during the hospital encounter of 07/16/21 (from the past 48 hour(s))  Lipid panel     Status: Abnormal   Collection Time: 07/17/21  6:34 AM  Result Value Ref Range   Cholesterol 224 (H) 0 - 200 mg/dL   Triglycerides 161364 (H) <150 mg/dL   HDL 48 >09>40 mg/dL   Total CHOL/HDL Ratio 4.7 RATIO   VLDL 73 (H) 0 - 40 mg/dL   LDL Cholesterol 604103 (H) 0 - 99 mg/dL    Comment:        Total Cholesterol/HDL:CHD Risk Coronary Heart Disease Risk Table                     Men   Women  1/2 Average Risk   3.4   3.3  Average Risk       5.0   4.4  2 X Average Risk   9.6   7.1  3 X Average Risk  23.4   11.0        Use the calculated Patient Ratio above and the CHD Risk Table to determine the patient's CHD Risk.        ATP III CLASSIFICATION (LDL):  <100     mg/dL   Optimal  540-981100-129  mg/dL   Near or Above                    Optimal  130-159  mg/dL   Borderline  191-478160-189  mg/dL   High  >295>190      mg/dL   Very High Performed at [facility], [address] [place], [place] [address]   TSH     Status: None   Collection Time: 07/17/21  6:34 AM  Result Value Ref Range   TSH 3.122 0.350 - 4.500 uIU/mL    Comment: Performed by a 3rd Generation assay with a functional sensitivity of <=0.01 uIU/mL. Performed at [facility], [address] [place], [place] [address]    Blood Alcohol level:  Lab Results  Component Value Date   [facility] 07/[facility]   ETH <10 05/01/2021   Metabolic Disorder Labs:  Lab Results  Component Value Date   HGBA1C 6.1 (H) 05/02/2021   MPG 128 05/02/2021   MPG 142.72 03/26/2020   No results found for: PROLACTIN Lab Results  Component Value Date   CHOL 224 (H) 07/17/2021   TRIG 364 (H) 07/17/2021   HDL 48 07/17/2021   CHOLHDL 4.7 07/17/2021   VLDL 73 (H) 07/17/2021   LDLCALC 103 (H) 07/17/2021   LDLCALC 58 10/15/2020   Current Medications: Current Facility-Administered Medications  Medication Dose Route Frequency Provider Last Rate Last Admin   acetaminophen (TYLENOL) tablet 650 mg  650 mg Oral Q6H PRN Novella Oliveolby, Karen R, NP   650 mg at 07/17/21 0830   alum & mag hydroxide-simeth (MAALOX/MYLANTA) 200-200-20 MG/5ML suspension 30 mL  30 mL Oral Q4H PRN Novella Oliveolby, Karen R, NP       ARIPiprazole (ABILIFY) tablet 10 mg  10 mg Oral Daily Novella Oliveolby, Karen R, NP   10 mg at 07/17/21 0831   hydrOXYzine (ATARAX/VISTARIL) tablet 25 mg  25 mg Oral TID PRN Novella Oliveolby, Karen R, NP       magnesium hydroxide (MILK OF MAGNESIA) suspension 30 mL  30 mL Oral Daily PRN Novella Oliveolby, Karen R, NP       traZODone (DESYREL) tablet 50 mg  50 mg Oral QHS PRN Novella Oliveolby, Karen R, NP  PTA Medications: Medications Prior to Admission  Medication Sig Dispense Refill Last Dose   acetaminophen (TYLENOL) 325 MG tablet Take 2 tablets (650 mg total) by mouth every 6 (six) hours as needed for mild pain (or Fever >/= 101). (Patient not taking: No sig reported)       Musculoskeletal: Strength & Muscle Tone: within normal limits Gait & Station: normal Patient leans: N/A  Psychiatric Specialty Exam: Physical Exam Vitals and nursing note reviewed.  Constitutional:      Appearance: She is well-developed.  HENT:     Head: Normocephalic and atraumatic.     Nose: Nose normal.     Mouth/Throat:     Pharynx: Oropharynx is clear.  Eyes:     Pupils: Pupils are equal, round, and reactive to light.  Cardiovascular:     Rate and Rhythm: Normal rate.     Pulses: Normal pulses.  Pulmonary:     Effort: Pulmonary effort is normal.  Genitourinary:    Comments: Deferred Musculoskeletal:     Cervical back: Normal range of motion.  Skin:    General: Skin is warm and dry.  Neurological:     General: No focal deficit present.     Mental Status: She is alert and oriented to person, place, and time.  Deferred  Review of Systems  Constitutional:  Negative for chills, diaphoresis and fever.  HENT:  Negative for congestion, rhinorrhea, sneezing and sore throat.   Respiratory:  Negative for cough, chest tightness, shortness of breath and wheezing.   Cardiovascular:  Negative for chest pain and palpitations.  Gastrointestinal:  Negative for diarrhea, nausea and vomiting.  Genitourinary:  Negative for difficulty urinating.  Musculoskeletal:  Negative for arthralgias and myalgias.  Skin: Negative.   Allergic/Immunologic: Negative for environmental allergies, food allergies and immunocompromised state.       Allergies: PCH, Haldol   Neurological:  Positive for headaches. Negative for dizziness, tremors, seizures, syncope, facial asymmetry, speech difficulty, weakness, light-headedness and numbness.  Psychiatric/Behavioral:  Positive for dysphoric mood. Negative for agitation (hx of), behavioral problems, confusion, decreased concentration, hallucinations, self-injury, sleep disturbance and suicidal ideas. The patient is not nervous/anxious and is not hyperactive.     Blood pressure 111/64, pulse 89, temperature 98.6 F (37 C), temperature source Oral, resp. rate 16, height 4\' 11"  (1.499 m), weight 58.1 kg, SpO2 100 %.Body mass index is 25.85 kg/m.  General Appearance: Disheveled  Eye Contact:  Fair  Speech:  Normal Rate  Volume:  Normal  Mood:  Dysphoric  Affect:  Congruent and Restricted  Thought Process:  Disorganized and Descriptions of Associations: Loose  Orientation:  Full (Time, Place, and Person)  Thought Content:  Delusions and Paranoid Ideation  Suicidal Thoughts:   Denies any thoughts, plans or intent  Homicidal Thoughts:   Denies  Memory:  Immediate;   Poor Recent;   Poor Remote;   Poor  Judgement:  Impaired  Insight:  Lacking  Psychomotor Activity:  Normal  Concentration:  Concentration: Fair and Attention Span: Fair  Recall:  of Knowledge:  Fair  Language:  Good  Akathisia:  Negative  Handed:  Right  AIMS (if indicated):     Assets:  Desire for Improvement Resilience  ADL's:  Intact  Cognition:  WNL  Sleep:  Number of Hours: 5.25   Treatment Plan Summary: Daily contact with patient to assess and evaluate symptoms and progress in treatment and Medication management.   Treatment Plan/Recommendations:  1. Admit for crisis management and stabilization,  estimated length of stay 3-5 days.    2. Medication management to reduce current symptoms to base line and improve the patient's overall level of functioning: See MAR, Md's SRA & treatment plan.    Mood control.  Continue Abilify 10 mg po daily (May transition to monthly injectable due to hx of non-compliance).  Anxiety. Continue Vistaril 25 mg po tid prn. Continue Lorazepam 1 mg po/IM Q 8 hrs prn.  Insomnia.  Continue Benadryl 25 mg po Q 8 hours prn for allergies, sleep, agitation/sleep.\  Or Continue Benadryl 25 mg IM Q 6 hours prn for agitation. Continue Trazodone 50 mg po Q hs prn.  Other medical issues: initiated,  Ferrous sulfate 325 mg po bid for  anemia. Miralax 17 gm po daily for prevention of constipation. Kdue 20 meq po bid x 3 days for hypokalemia.  Continue all other prn medications for nausea, indigestion, pain & or constipation respectively.  Obtain U/A (clean catch urine).  Encourage group participation. Discharge disposition plan is ongoing.   Observation Level/Precautions:  15 minute checks  Laboratory:   Current lab results reviewed. K 3.3, will obtain urinalysis.  Psychotherapy: Group sessions  Medications: See MAR   Consultations:  As needed  Discharge Concerns: Safety, mood stabilization.  Estimated LOS: 3-5 days  Other: Moved to the [address]-hall.    Physician Treatment Plan for Primary Diagnosis: Schizophrenia (HCC) Long Term Goal(s): Improvement in symptoms so as ready for discharge  Short Term Goals: Ability to identify changes in lifestyle to reduce recurrence of condition will improve, Ability to verbalize feelings will improve, Ability to disclose and discuss suicidal ideas, and Ability to demonstrate self-control will improve  Physician Treatment Plan for Secondary Diagnosis: Principal Problem:   Schizophrenia (HCC)  Long Term Goal(s): Improvement in symptoms so as ready for discharge  Short Term Goals: Ability to demonstrate self-control will improve, Ability to identify and develop effective coping behaviors will improve, and Compliance with prescribed medications will improve  I certify that inpatient services furnished can reasonably be expected to improve the patient's condition.    Armandina Stammer, NP, pmhnp, fnp-bc 8/26/202210:42 AM

## 2021-07-18 DIAGNOSIS — F200 Paranoid schizophrenia: Secondary | ICD-10-CM | POA: Diagnosis not present

## 2021-07-18 NOTE — Progress Notes (Addendum)
   07/18/21 1700  Psych Admission Type (Psych Patients Only)  Admission Status Involuntary  Psychosocial Assessment  Patient Complaints Suspiciousness  Eye Contact Fair  Facial Expression Anxious  Affect Anxious;Preoccupied;Sad  Speech Unremarkable  Interaction Defensive  Motor Activity Restless;Slow  Appearance/Hygiene Disheveled;Body odor;Bizarre;Poor hygiene  Behavior Characteristics Cooperative;Guarded  Mood Suspicious  Thought Process  Coherency Circumstantial;Concrete thinking  Content Blaming others  Delusions Paranoid  Perception Hallucinations  Hallucination None reported or observed  Judgment Impaired  Confusion WDL  Danger to Self  Current suicidal ideation? Denies  Danger to Others  Danger to Others None reported or observed    Pt continues to isolate- having minimal interactions with others on the unit. Pt is calm and cooperative, but guarded during interactions. Per pt's self inventory, pt rated her depression, hopelessness and anxiety all 0's today. Pt denied SI/HI and A/VH.

## 2021-07-18 NOTE — B[facility] Group Notes (Signed)
LCSW Group Therapy Note  07/18/2021  Type of Therapy and Topic:  Group Therapy - Healthy vs Unhealthy Coping Skills  Participation Level:  Active   Description of Group The focus of this group was to determine what unhealthy coping techniques typically are used by group members and what healthy coping techniques would be helpful in coping with various problems. Patients were guided in becoming aware of the differences between healthy and unhealthy coping techniques. Patients were asked to identify 2-3 healthy coping skills they would like to learn to use more effectively, and many mentioned meditation, breathing, and relaxation. These were explained, samples demonstrated, and resources shared for how to learn more at discharge. At group closing, additional ideas of healthy coping skills were shared in a fun exercise.  Therapeutic Goals Patients learned that coping is what human beings do all day long to deal with various situations in their lives Patients defined and discussed healthy vs unhealthy coping techniques Patients identified their preferred coping techniques and identified whether these were healthy or unhealthy Patients determined 2-3 healthy coping skills they would like to become more familiar with and use more often, and practiced a few medications Patients provided support and ideas to each other   Summary of Patient Progress:  Pt was given packet of worksheets discussing coping skills. Pt was encouraged to seek out CSW if any questions arose while completing the worksheets.    Therapeutic Modalities Cognitive Behavioral Therapy Motivational Interviewing  Quantisha Marsicano, LCSWA Clinicial Social Worker [facility]   

## 2021-07-18 NOTE — BHH Counselor (Signed)
Stephanie [name], patient, declined to provide written consent for CSW team to coordinate aftercare at this time. CSW to have further conversations with patient regarding care in the community.   Signed:  Corky Crafts, MSW, [place], LCASA 07/18/2021 2:59 PM

## 2021-07-18 NOTE — B[facility] Group Notes (Signed)
[facility] Group Notes:  (Nursing/MHT/Case Management/Adjunct)  Date:  07/18/2021  Time:  11:01 AM  Type of Therapy:  Group Therapy  Participation Level:  Active  Participation Quality:  Appropriate  Affect:  Appropriate  Cognitive:  Appropriate  Insight:  Appropriate  Engagement in Group:  Engaged  Modes of Intervention:  Orientation  Summary of Progress/Problems:Pt goal for today is to get on the right medications, work on Presenter, broadcasting.   Corky Blumstein J Floyd Lusignan 07/18/2021, 11:01 AM

## 2021-07-18 NOTE — BHH Group Notes (Signed)
Adult Psychoeducational Group Note  Date:  07/18/2021 Time:  6:40 PM    Participation Level:  Did Not Attend     Stephanie [name] 07/18/2021, 6:40 PM

## 2021-07-18 NOTE — Progress Notes (Signed)
[facility]  07/18/2021 4:16 PM Stephanie CanalesOwetta D [name]  MRN:  [id number]  Subjective: Stephanie [name] reports, "I'm feeling fine. I'm not depressed or anxious. I'm doing alright". Daily notes: Stephanie [name] is seen, chart reviewed. The chart findings discussed with the treatment team. She presents alert, verbally responsive & making a good eye contact. She is visible on the unit, attending group sessions. She continues to deny any symptoms of depression or anxiety. She does not appear to be in any apparent distress. However, she appears disheveled, disorganized & paranoid. There are no behavioral issues reported by staff. She is taking & tolerating her treatment regimen. Denies any adverse effects or reactions. She denies any AVH. Will continue her current plan of care as already in progress.  Objective: 45 year old AA female with hx of Schizophrenia & medication non-compliance. Patient was apparently found destroying her home and kitchen cabinets. Patient had apparently pulled multiple cabinets out of the wall and had pushed her washing machine out her front door.  She had also pulled a receptacle for the dryer out of the wall.  Patient reported that she was "remodeling her apartment".  Her UDS was negative of all substances & BAL was < 10. Reports also indicated that patient was neglecting her personal hygiene.   Principal Problem: Schizophrenia (HCC)  Diagnosis: Principal Problem:   Schizophrenia (HCC)  Total Time spent with patient:  25 minutes  Past Psychiatric History: See H&P  Past Medical History:  Past Medical History:  Diagnosis Date   Schizophrenia (HCC)     Past Surgical History:  Procedure Laterality Date   INDUCED ABORTION     Family History:  Family History  Problem Relation Age of Onset   Cancer Father    Family Psychiatric  History: See H&P  Social History:  Social History   Substance and Sexual Activity  Alcohol Use Never     Social History   Substance and Sexual  Activity  Drug Use Never    Social History   Socioeconomic History   Marital status: Unknown    Spouse name: Not on file   Number of children: Not on file   Years of education: Not on file   Highest education level: Not on file  Occupational History   Not on file  Tobacco Use   Smoking status: Never    Passive exposure: Never   Smokeless tobacco: Never  Vaping Use   Vaping Use: Former  Substance and Sexual Activity   Alcohol use: Never   Drug use: Never   Sexual activity: Never  Other Topics Concern   Not on file  Social History Narrative   ** Merged History Encounter **       Social Determinants of Health   Financial Resource Strain: Not on file  Food Insecurity: Not on file  Transportation Needs: Not on file  Physical Activity: Not on file  Stress: Not on file  Social Connections: Not on file   Additional Social History:   Sleep: Good  Appetite:  Good  Current Medications: Current Facility-Administered Medications  Medication Dose Route Frequency Provider Last Rate Last Admin   acetaminophen (TYLENOL) tablet 650 mg  650 mg Oral Q6H PRN Novella Oliveolby, Karen R, NP   650 mg at 07/17/21 0830   alum & mag hydroxide-simeth (MAALOX/MYLANTA) 200-200-20 MG/5ML suspension 30 mL  30 mL Oral Q4H PRN Novella Oliveolby, Karen R, NP       ARIPiprazole (ABILIFY) tablet 10 mg  10 mg Oral Daily Dorena BodoDolby, Karen  R, NP   10 mg at 07/18/21 0929   diphenhydrAMINE (BENADRYL) capsule 25 mg  25 mg Oral Q8H PRN Mariel Craft, MD   25 mg at 07/17/21 1149   Or   diphenhydrAMINE (BENADRYL) injection 25 mg  25 mg Intramuscular Q6H PRN Mariel Craft, MD       ferrous sulfate tablet 325 mg  325 mg Oral BID WC Armandina Stammer I, NP   325 mg at 07/18/21 5009   hydrOXYzine (ATARAX/VISTARIL) tablet 25 mg  25 mg Oral TID PRN Novella Olive, NP       LORazepam (ATIVAN) tablet 1 mg  1 mg Oral Q8H PRN Mariel Craft, MD   1 mg at 07/17/21 1147   Or   LORazepam (ATIVAN) injection 1 mg  1 mg Intramuscular Q8H PRN  Mariel Craft, MD       magnesium hydroxide (MILK OF MAGNESIA) suspension 30 mL  30 mL Oral Daily PRN Novella Olive, NP       polyethylene glycol (MIRALAX / GLYCOLAX) packet 17 g  17 g Oral Daily Armandina Stammer I, NP   17 g at 07/18/21 0932   potassium chloride SA (KLOR-CON) CR tablet 20 mEq  20 mEq Oral BID Armandina Stammer I, NP   20 mEq at 07/18/21 3818   traZODone (DESYREL) tablet 50 mg  50 mg Oral QHS PRN Novella Olive, NP   50 mg at 07/17/21 2103    Lab Results:  Results for orders placed or performed during the hospital encounter of 07/16/21 (from the past 48 hour(s))  Lipid panel     Status: Abnormal   Collection Time: 07/17/21  6:34 AM  Result Value Ref Range   Cholesterol 224 (H) 0 - 200 mg/dL   Triglycerides 299 (H) <150 mg/dL   HDL 48 >37 mg/dL   Total CHOL/HDL Ratio 4.7 RATIO   VLDL 73 (H) 0 - 40 mg/dL   LDL Cholesterol 169 (H) 0 - 99 mg/dL    Comment:        Total Cholesterol/HDL:CHD Risk Coronary Heart Disease Risk Table                     Men   Women  1/2 Average Risk   3.4   3.3  Average Risk       5.0   4.4  2 X Average Risk   9.6   7.1  3 X Average Risk  23.4   11.0        Use the calculated Patient Ratio above and the CHD Risk Table to determine the patient's CHD Risk.        ATP III CLASSIFICATION (LDL):  <100     mg/dL   Optimal  678-938  mg/dL   Near or Above                    Optimal  130-159  mg/dL   Borderline  101-751  mg/dL   High  >025     mg/dL   Very High Performed at [facility], [address], [place], [place] [address]   TSH     Status: None   Collection Time: 07/17/21  6:34 AM  Result Value Ref Range   TSH 3.122 0.350 - 4.500 uIU/mL    Comment: Performed by a 3rd Generation assay with a functional sensitivity of <=0.01 uIU/mL. Performed at [facility], [address]  [place], [place] [address]    Blood Alcohol level:  Lab Results  Component Value Date   ETH <10 07/13/2021   ETH <10  05/01/2021   Metabolic Disorder Labs: Lab Results  Component Value Date   HGBA1C 6.1 (H) 05/02/2021   MPG 128 05/02/2021   MPG 142.72 03/26/2020   No results found for: PROLACTIN Lab Results  Component Value Date   CHOL 224 (H) 07/17/2021   TRIG 364 (H) 07/17/2021   HDL 48 07/17/2021   CHOLHDL 4.7 07/17/2021   VLDL 73 (H) 07/17/2021   LDLCALC 103 (H) 07/17/2021   LDLCALC 58 10/15/2020   Physical Findings: AIMS:  , ,  ,  ,    CIWA:    COWS:     Musculoskeletal: Strength & Muscle Tone: within normal limits Gait & Station: normal Patient leans: N/A  Psychiatric Specialty Exam:  Presentation  General Appearance: Disheveled  Eye Contact:Fair  Speech:Clear and Coherent; Normal Rate  Speech Volume:Normal  Handedness:Right  Mood and Affect  Mood:-- ("My mood is good. I'm depressed or anxious".)  Affect:Restricted  Thought Process  Thought Processes:Disorganized  Descriptions of Associations:Circumstantial  Orientation:Full (Time, Place and Person)  Thought Content:Delusions; Illogical; Paranoid Ideation  History of Schizophrenia/Schizoaffective disorder:Yes  Duration of Psychotic Symptoms:Greater than six months  Hallucinations:Hallucinations: Other (comment) (Patient has hx of psychosis, however, currently denies any AVH.)  Ideas of Reference:Delusions; Paranoia  Suicidal Thoughts:Suicidal Thoughts: No  Homicidal Thoughts:Homicidal Thoughts: No  Sensorium  Memory:Immediate Fair; Recent Fair; Remote Poor  Judgment:Impaired  Insight:Lacking  Executive Functions  Concentration:Fair  Attention Span:Fair  Recall:Fair  Fund of Knowledge:Fair  Language:Fair  Psychomotor Activity  Psychomotor Activity:Psychomotor Activity: Normal  Assets  Assets:Communication Skills; Desire for Improvement; Resilience  Sleep  Sleep:Sleep: Good Number of Hours of Sleep: 7  Physical Exam: Physical Exam Vitals and nursing note reviewed.  HENT:      Head: Normocephalic.     Nose: Nose normal.     Mouth/Throat:     Pharynx: Oropharynx is clear.  Eyes:     Pupils: Pupils are equal, round, and reactive to light.  Cardiovascular:     Rate and Rhythm: Normal rate.     Pulses: Normal pulses.  Pulmonary:     Effort: Pulmonary effort is normal.  Genitourinary:    Comments: Deferred Musculoskeletal:        General: Normal range of motion.     Cervical back: Normal range of motion.  Skin:    General: Skin is warm and dry.  Neurological:     General: No focal deficit present.     Mental Status: She is alert and oriented to person, place, and time.   Review of Systems  Constitutional:  Negative for diaphoresis and fever.  HENT:  Negative for congestion and sore throat.   Eyes:  Negative for blurred vision.  Respiratory:  Negative for cough, shortness of breath and wheezing.   Cardiovascular:  Negative for chest pain and palpitations.  Gastrointestinal:  Negative for abdominal pain, constipation, diarrhea, heartburn, nausea and vomiting.  Genitourinary:  Negative for dysuria.  Musculoskeletal:  Negative for joint pain and myalgias.  Skin: Negative.   Neurological:  Negative for dizziness, tingling, tremors, sensory change, speech change, focal weakness, seizures, loss of consciousness, weakness and headaches.  Endo/Heme/Allergies:        Allergies: Haldol, PCN,   Psychiatric/Behavioral:  Positive for hallucinations (Hx. psychosis). Negative for depression, memory loss, substance abuse and suicidal ideas. The patient is not nervous/anxious and does not  have insomnia.   Blood pressure 132/90, pulse 90, temperature 97.9 F (36.6 C), temperature source Oral, resp. rate 16, height 4\' 11"  (1.499 m), weight 58.1 kg, SpO2 100 %. Body mass index is 25.85 kg/m.  Treatment Plan Summary: Daily contact with patient to assess and evaluate symptoms and progress in treatment and Medication management.  Will continue today 07/18/2021 plan as below  except where it is noted.   Mood control. Continue Abilify 10 mg po daily.  Anxiety. Continue Vistaril 25 mg po tid prn.  Continue Ativan 1 mg po/IM Q 8 hrs prn for severe anxiety. Continue Benadryl 25 mg po Q 8 hrs prn for agitation. Continue Benadryl 25 mg IM Q 6 hrs prn for sever agitation.  Insomnia. Continue Trazodone 50 mg po Q hs prn.   Other medical issues: continue,  Ferrous sulfate 325 mg po bid for anemia. Miralax 17 g po daily for constipation. Kdur 20 meq po bid x 2 more days. Continue other prns for indigestion, constipation, pain/fever as recommended.   Encourage group participation. Discharge disposition plan is ongoing.  Continue the No room-mate status.  07/20/2021, NP, phnp, fnp-bc 07/18/2021, 4:16 PM
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
## 2021-07-18 NOTE — B[facility] Group Notes (Signed)
[facility] Group Notes:  (Nursing/MHT/Case Management/Adjunct)  Date:  07/18/2021  Time:  2:34 PM  Type of Therapy:  Psychoeducational Skills  Participation Level:  Active  Participation Quality:  Appropriate  Affect:  Appropriate  Cognitive:  Appropriate  Insight:  Appropriate  Engagement in Group:  Engaged  Modes of Intervention:  Education  Summary of Progress/Problems: Anger management is the topic. Pt says that she deals with anger by listening to music and taking long walks trying to think all positive thoughts.   Royalty Fakhouri J Schwanda Zima 07/18/2021, 2:34 PM

## 2021-07-18 NOTE — Progress Notes (Signed)
Patient has been isolative to her room other than to take her medications and request sandwich tray which she received. She was pleasant with Clinical research associate and reported that she remembered me from the last time she was here.   07/18/21 2105  Psych Admission Type (Psych Patients Only)  Admission Status Involuntary  Psychosocial Assessment  Patient Complaints Suspiciousness  Eye Contact Fair  Facial Expression Flat  Affect Preoccupied;Sad  Speech Unremarkable  Interaction Isolative  Motor Activity Slow  Appearance/Hygiene Disheveled;Body odor;Bizarre;Poor hygiene  Behavior Characteristics Appropriate to situation  Mood Preoccupied;Pleasant  Thought Process  Coherency Circumstantial;Concrete thinking  Content Blaming others  Delusions Paranoid  Perception Hallucinations  Hallucination None reported or observed  Judgment Impaired  Confusion WDL  Danger to Self  Current suicidal ideation? Denies  Danger to Others  Danger to Others None reported or observed

## 2021-07-18 NOTE — BHH Suicide Risk Assessment (Signed)
[facility] INPATIENT:  Family/Significant Other Suicide Prevention Education  Suicide Prevention Education:  Patient Refusal for Family/Significant Other Suicide Prevention Education: The patient Stephanie [name] has refused to provide written consent for family/significant other to be provided Family/Significant Other Suicide Prevention Education during admission and/or prior to discharge.  Physician notified.  CSW discussed safety plan with patient during assessment.   Corky Crafts 07/18/2021, 2:59 PM

## 2021-07-19 DIAGNOSIS — F200 Paranoid schizophrenia: Secondary | ICD-10-CM | POA: Diagnosis not present

## 2021-07-19 NOTE — Progress Notes (Signed)
Adult Psychoeducational Group Note  Date:  07/19/2021 Time:  12:53 AM  Group Topic/Focus:  Wrap-Up Group:   The focus of this group is to help patients review their daily goal of treatment and discuss progress on daily workbooks.  Participation Level:  Minimal  Participation Quality:  Appropriate  Affect:  Appropriate  Cognitive:  Appropriate  Insight: Limited  Engagement in Group:  Limited  Modes of Intervention:  Discussion  Additional Comments:  Pt stated her goal for today was to focus on her treatment plan. Pt stated she accomplished her goal today. Pt stated she talked with her doctor and her social worker about her care today. Pt rated her overall day a 10. Pt stated she made no calls today. Pt stated she felt better about herself tonight. Pt stated she was able to attend all groups held today. Pt stated she was able to attend all meals. Pt stated she took all medications provided today. Pt stated her appetite was pretty good today. Pt rated her sleep last night was pretty good. Pt stated the goal tonight was to get some rest. Pt stated she had no physical pain today. Pt deny visual hallucinations and auditory issues tonight. Pt denies thoughts of harming herself or others. Pt stated she would alert staff if anything changed.  Stephanie [name] 07/19/2021, 12:53 AM

## 2021-07-19 NOTE — Progress Notes (Signed)
   07/19/21 1300  Psych Admission Type (Psych Patients Only)  Admission Status Involuntary  Psychosocial Assessment  Patient Complaints Suspiciousness  Eye Contact Fair  Facial Expression Flat  Affect Preoccupied;Sad  Speech Unremarkable  Interaction Isolative  Motor Activity Slow  Appearance/Hygiene Disheveled;Body odor;Bizarre;Poor hygiene  Behavior Characteristics Cooperative;Guarded  Mood Pleasant;Preoccupied  Thought Process  Coherency Circumstantial;Concrete thinking  Content Blaming others  Delusions Paranoid  Perception Hallucinations  Hallucination Tactile;Auditory  Judgment Impaired  Confusion WDL  Danger to Self  Current suicidal ideation? Denies  Danger to Others  Danger to Others None reported or observed

## 2021-07-19 NOTE — Progress Notes (Signed)
[facility] MD Progress Note  07/19/2021 1:52 PM Stephanie [name]  MRN:  [id number]  Subjective: Stephanie [name] reports, "I'm fine. My mood is good. I'm not depressed or anxious. I'm doing alright". Daily notes: Stephanie [name] is seen, chart reviewed. The chart findings discussed with the treatment team. She presents alert, verbally responsive & making a good eye contact. She is visible on the unit, attending group sessions. She continues to deny any symptoms of depression or anxiety. She does not appear to be in any apparent distress. However, she appears disheveled, disorganized & paranoid. There are no behavioral issues reported by staff. She is taking & tolerating her treatment regimen. Denies any adverse effects or reactions. She denies any AVH. Will continue her current plan of care as already in progress. Vital signs reviewed, stable.  Objective: 45 year old AA female with hx of Schizophrenia & medication non-compliance. Patient was apparently found destroying her home and kitchen cabinets. Patient had apparently pulled multiple cabinets out of the wall and had pushed her washing machine out her front door.  She had also pulled a receptacle for the dryer out of the wall.  Patient reported that she was "remodeling her apartment".  Her UDS was negative of all substances & BAL was < 10. Reports also indicated that patient was neglecting her personal hygiene.   Principal Problem: Schizophrenia (HCC)  Diagnosis: Principal Problem:   Schizophrenia (HCC)  Total Time spent with patient: 15 minutes  Past Psychiatric History: See H&P  Past Medical History:  Past Medical History:  Diagnosis Date   Schizophrenia (HCC)     Past Surgical History:  Procedure Laterality Date   INDUCED ABORTION     Family History:  Family History  Problem Relation Age of Onset   Cancer Father    Family Psychiatric  History: See H&P  Social History:  Social History   Substance and Sexual Activity  Alcohol Use Never     Social  History   Substance and Sexual Activity  Drug Use Never    Social History   Socioeconomic History   Marital status: Unknown    Spouse name: Not on file   Number of children: Not on file   Years of education: Not on file   Highest education level: Not on file  Occupational History   Not on file  Tobacco Use   Smoking status: Never    Passive exposure: Never   Smokeless tobacco: Never  Vaping Use   Vaping Use: Former  Substance and Sexual Activity   Alcohol use: Never   Drug use: Never   Sexual activity: Never  Other Topics Concern   Not on file  Social History Narrative   ** Merged History Encounter **       Social Determinants of Health   Financial Resource Strain: Not on file  Food Insecurity: Not on file  Transportation Needs: Not on file  Physical Activity: Not on file  Stress: Not on file  Social Connections: Not on file   Additional Social History:   Sleep: Good  Appetite:  Good  Current Medications: Current Facility-Administered Medications  Medication Dose Route Frequency Provider Last Rate Last Admin   acetaminophen (TYLENOL) tablet 650 mg  650 mg Oral Q6H PRN Novella Olive, NP   650 mg at 07/17/21 0830   alum & mag hydroxide-simeth (MAALOX/MYLANTA) 200-200-20 MG/5ML suspension 30 mL  30 mL Oral Q4H PRN Novella Olive, NP       ARIPiprazole (ABILIFY) tablet 10 mg  10 mg Oral Daily Novella Olive, NP   10 mg at 07/19/21 1000   diphenhydrAMINE (BENADRYL) capsule 25 mg  25 mg Oral Q8H PRN Mariel Craft, MD   25 mg at 07/17/21 1149   Or   diphenhydrAMINE (BENADRYL) injection 25 mg  25 mg Intramuscular Q6H PRN Mariel Craft, MD       ferrous sulfate tablet 325 mg  325 mg Oral BID WC Armandina Stammer I, NP   325 mg at 07/19/21 1000   hydrOXYzine (ATARAX/VISTARIL) tablet 25 mg  25 mg Oral TID PRN Novella Olive, NP       LORazepam (ATIVAN) tablet 1 mg  1 mg Oral Q8H PRN Mariel Craft, MD   1 mg at 07/17/21 1147   Or   LORazepam (ATIVAN) injection 1 mg   1 mg Intramuscular Q8H PRN Mariel Craft, MD       magnesium hydroxide (MILK OF MAGNESIA) suspension 30 mL  30 mL Oral Daily PRN Novella Olive, NP       polyethylene glycol (MIRALAX / GLYCOLAX) packet 17 g  17 g Oral Daily Nayshawn Mesta, Nicole [facility]red I, NP   17 g at 07/19/21 1000   potassium chloride SA (KLOR-CON) CR tablet 20 mEq  20 mEq Oral BID Armandina Stammer I, NP   20 mEq at 07/19/21 1000   traZODone (DESYREL) tablet 50 mg  50 mg Oral QHS PRN Novella Olive, NP   50 mg at 07/18/21 2101   Lab Results:  No results found for this or any previous visit (from the past 48 hour(s)).  Blood Alcohol level:  Lab Results  Component Value Date   ETH <10 07/13/2021   ETH <10 05/01/2021   Metabolic Disorder Labs: Lab Results  Component Value Date   HGBA1C 6.1 (H) 05/02/2021   MPG 128 05/02/2021   MPG 142.72 03/26/2020   No results found for: PROLACTIN Lab Results  Component Value Date   CHOL 224 (H) 07/17/2021   TRIG 364 (H) 07/17/2021   HDL 48 07/17/2021   CHOLHDL 4.7 07/17/2021   VLDL 73 (H) 07/17/2021   LDLCALC 103 (H) 07/17/2021   LDLCALC 58 10/15/2020   Physical Findings: AIMS:  , ,  ,  ,    CIWA:    COWS:     Musculoskeletal: Strength & Muscle Tone: within normal limits Gait & Station: normal Patient leans: N/A  Psychiatric Specialty Exam:  Presentation  General Appearance: Casual; Disheveled; Appropriate for Environment  Eye Contact:Fair  Speech:Normal Rate; Clear and Coherent  Speech Volume:Decreased  Handedness:Right  Mood and Affect  Mood:-- ("I'm good. I'm not depressed or anxious".)  Affect:Congruent  Thought Process  Thought Processes:Coherent  Descriptions of Associations:Intact  Orientation:Full (Time, Place and Person)  Thought Content:Paranoid Ideation  History of Schizophrenia/Schizoaffective disorder:Yes  Duration of Psychotic Symptoms:Greater than six months  Hallucinations:Hallucinations: None  Ideas of Reference:None  Suicidal  Thoughts:Suicidal Thoughts: No  Homicidal Thoughts:Homicidal Thoughts: No  Sensorium  Memory:Immediate Fair; Recent Fair; Remote Fair  Judgment:Impaired  Insight:Poor  Executive Functions  Concentration:Fair  Attention Span:Fair  Recall:Fair  Fund of Knowledge:Poor  Language:Fair  Psychomotor Activity  Psychomotor Activity:Psychomotor Activity: Normal  Assets  Assets:Communication Skills; Desire for Improvement; Resilience  Sleep  Sleep:Number of Hours of Sleep: 6.25  Physical Exam: Physical Exam Vitals and nursing note reviewed.  HENT:     Head: Normocephalic.     Nose: Nose normal.     Mouth/Throat:     Pharynx: Oropharynx  is clear.  Eyes:     Pupils: Pupils are equal, round, and reactive to light.  Cardiovascular:     Rate and Rhythm: Normal rate.     Pulses: Normal pulses.  Pulmonary:     Effort: Pulmonary effort is normal.  Genitourinary:    Comments: Deferred Musculoskeletal:        General: Normal range of motion.     Cervical back: Normal range of motion.  Skin:    General: Skin is warm and dry.  Neurological:     General: No focal deficit present.     Mental Status: She is alert and oriented to person, place, and time.   Review of Systems  Constitutional:  Negative for diaphoresis and fever.  HENT:  Negative for congestion and sore throat.   Eyes:  Negative for blurred vision.  Respiratory:  Negative for cough, shortness of breath and wheezing.   Cardiovascular:  Negative for chest pain and palpitations.  Gastrointestinal:  Negative for abdominal pain, constipation, diarrhea, heartburn, nausea and vomiting.  Genitourinary:  Negative for dysuria.  Musculoskeletal:  Negative for joint pain and myalgias.  Skin: Negative.   Neurological:  Negative for dizziness, tingling, tremors, sensory change, speech change, focal weakness, seizures, loss of consciousness, weakness and headaches.  Endo/Heme/Allergies:        Allergies: Haldol, PCN,    Psychiatric/Behavioral:  Positive for hallucinations (Hx. psychosis). Negative for depression, memory loss, substance abuse and suicidal ideas. The patient is not nervous/anxious and does not have insomnia.   Blood pressure 123/74, pulse 92, temperature 98.3 F (36.8 C), temperature source Oral, resp. rate 16, height 4\' 11"  (1.499 m), weight 58.1 kg, SpO2 100 %. Body mass index is 25.85 kg/m.  Treatment Plan Summary: Daily contact with patient to assess and evaluate symptoms and progress in treatment and Medication management.  Will continue today 07/19/2021 plan as below except where it is noted.   Mood control. Continue Abilify 10 mg po daily.  Anxiety. Continue Vistaril 25 mg po tid prn.  Continue Ativan 1 mg po/IM Q 8 hrs prn for severe anxiety. Continue Benadryl 25 mg po Q 8 hrs prn for agitation. Continue Benadryl 25 mg IM Q 6 hrs prn for sever agitation.  Insomnia. Continue Trazodone 50 mg po Q hs prn.   Other medical issues: continue,  Ferrous sulfate 325 mg po bid for anemia. Miralax 17 g po daily for constipation. Kdur 20 meq po bid x 2 more days. Continue other prns for indigestion, constipation, pain/fever as recommended.   Encourage group participation. Discharge disposition plan is ongoing.  Continue the No room-mate status.  07/21/2021, NP, phnp, fnp-bc 07/19/2021, 1:52 PM Patient ID: Stephanie [name], female   DOB: [birth year], 45 y.o.   MRN: [id number]

## 2021-07-19 NOTE — Progress Notes (Signed)
   07/19/21 2105  Psych Admission Type (Psych Patients Only)  Admission Status Voluntary  Psychosocial Assessment  Patient Complaints None  Eye Contact Fair  Facial Expression Flat  Affect Preoccupied;Sad  Speech Logical/coherent  Interaction Isolative  Motor Activity Slow  Appearance/Hygiene Disheveled;Body odor;Bizarre;Poor hygiene  Behavior Characteristics Cooperative;Appropriate to situation  Mood Preoccupied;Pleasant  Thought Process  Coherency Circumstantial;Concrete thinking  Content Blaming others  Delusions Paranoid  Perception Hallucinations  Hallucination Tactile;Auditory  Judgment Impaired  Confusion WDL  Danger to Self  Current suicidal ideation? Denies  Danger to Others  Danger to Others None reported or observed

## 2021-07-20 LAB — CBC WITH DIFFERENTIAL/PLATELET
Abs Immature Granulocytes: 0.06 10*3/uL (ref 0.00–0.07)
Basophils Absolute: 0.1 10*3/uL (ref 0.0–0.1)
Basophils Relative: 1 %
Eosinophils Absolute: 0.1 10*3/uL (ref 0.0–0.5)
Eosinophils Relative: 2 %
HCT: 31.8 % — ABNORMAL LOW (ref 36.0–46.0)
Hemoglobin: 9.8 g/dL — ABNORMAL LOW (ref 12.0–15.0)
Immature Granulocytes: 1 %
Lymphocytes Relative: 36 %
Lymphs Abs: 2.5 10*3/uL (ref 0.7–4.0)
MCH: 27.4 pg (ref 26.0–34.0)
MCHC: 30.8 g/dL (ref 30.0–36.0)
MCV: 88.8 fL (ref 80.0–100.0)
Monocytes Absolute: 0.5 10*3/uL (ref 0.1–1.0)
Monocytes Relative: 7 %
Neutro Abs: 3.6 10*3/uL (ref 1.7–7.7)
Neutrophils Relative %: 53 %
Platelets: 348 10*3/uL (ref 150–400)
RBC: 3.58 MIL/uL — ABNORMAL LOW (ref 3.87–5.11)
RDW: 18.8 % — ABNORMAL HIGH (ref 11.5–15.5)
WBC: 6.8 10*3/uL (ref 4.0–10.5)
nRBC: 0 % (ref 0.0–0.2)

## 2021-07-20 LAB — POTASSIUM: Potassium: 4.4 mmol/L (ref 3.5–5.1)

## 2021-07-20 MED ORDER — OLANZAPINE 5 MG PO TBDP
5.0000 mg | ORAL_TABLET | Freq: Three times a day (TID) | ORAL | Status: DC | PRN
  Administered 2021-07-21 – 2021-07-27 (×2): 5 mg via ORAL
  Filled 2021-07-20 (×2): qty 1

## 2021-07-20 MED ORDER — ZIPRASIDONE MESYLATE 20 MG IM SOLR: 20.0000 mg | INTRAMUSCULAR | Status: DC | PRN

## 2021-07-20 MED ORDER — LORAZEPAM 1 MG PO TABS
1.0000 mg | ORAL_TABLET | ORAL | Status: DC | PRN
Start: 2021-07-20 — End: 2021-07-29

## 2021-07-20 NOTE — Progress Notes (Signed)
Adult Psychoeducational Group Note  Date:  07/20/2021 Time:  10:38 PM  Group Topic/Focus:  Wrap-Up Group:   The focus of this group is to help patients review their daily goal of treatment and discuss progress on daily workbooks.  Participation Level:  Active  Participation Quality:  Appropriate  Affect:  Anxious and Irritable  Cognitive:  Disorganized and Confused  Insight: Limited  Engagement in Group:  Limited  Modes of Intervention:  Discussion  Additional Comments: Pt stated her goal for today was to focus on her treatment plan. Pt stated she accomplished her goal today. Pt stated she talked with her doctor but did not get a chance to talk with her social worker about her care today. Pt rated her overall day a 10. Pt stated she made no calls today. Pt stated she felt better about herself tonight. Pt stated she was able to attend all groups held today. Pt stated she was able to attend all meals. Pt stated she took all medications provided today. Pt stated her appetite was pretty good today. Pt rated her sleep last night was pretty good. Pt stated the goal tonight was to get some rest. Pt stated she had some physical pain tonight. Pt stated she had some severe headache pain tonight. Pt rated the headache pain a 8 on the pain level scale. Pt nurse was updated on situation. Pt deny visual hallucinations and auditory issues tonight. Pt denies thoughts of harming herself or others. Pt stated she would alert staff if anything changed.  Stephanie [name] 07/20/2021, 10:38 PM

## 2021-07-20 NOTE — Progress Notes (Signed)
   07/20/21 1100  Psych Admission Type (Psych Patients Only)  Admission Status Voluntary  Psychosocial Assessment  Patient Complaints None  Eye Contact Fair  Facial Expression Flat  Affect Preoccupied;Sad  Speech Logical/coherent  Interaction Isolative  Motor Activity Slow  Appearance/Hygiene Disheveled;Body odor;Bizarre;Poor hygiene  Behavior Characteristics Cooperative  Mood Preoccupied  Thought Process  Coherency Circumstantial;Concrete thinking  Content Blaming others  Delusions Paranoid  Perception Hallucinations  Hallucination Tactile;Auditory  Judgment Impaired  Confusion WDL  Danger to Self  Current suicidal ideation? Denies  Danger to Others  Danger to Others None reported or observed

## 2021-07-20 NOTE — Progress Notes (Signed)
Pt given PRN Trazodone per [facility]     07/20/21 2300  Psych Admission Type (Psych Patients Only)  Admission Status Voluntary  Psychosocial Assessment  Patient Complaints None  Eye Contact Fair  Facial Expression Flat  Affect Preoccupied;Sad  Speech Logical/coherent  Interaction Isolative  Motor Activity Slow  Appearance/Hygiene Disheveled;Body odor;Bizarre;Poor hygiene  Behavior Characteristics Cooperative  Mood Preoccupied  Thought Process  Coherency Circumstantial;Concrete thinking  Content Blaming others  Delusions Paranoid  Perception Hallucinations  Hallucination Tactile;Auditory  Judgment Impaired  Confusion WDL  Danger to Self  Current suicidal ideation? Denies  Danger to Others  Danger to Others None reported or observed

## 2021-07-20 NOTE — Progress Notes (Signed)
[facility] MD Progress Note  07/20/2021 4:46 PM Stephanie [name]  MRN:  [id number] Subjective:   45 year old AA female with hx of Schizophrenia & medication non-compliance. Patient was apparently found destroying her home and kitchen cabinets. Patient had apparently pulled multiple cabinets out of the wall and had pushed her washing machine out her front door.  She had also pulled a receptacle for the dryer out of the wall.  Patient reported that she was "remodeling her apartment".  Her UDS was negative of all substances & BAL was < 10. Reports also indicated that patient was neglecting her personal hygiene.   The patient's chart was reviewed and nursing notes were reviewed. Over the past 24 hrs, there were no documented behavioral issues, no PRN medications given for agitation.The patient's case was discussed in multidisciplinary team meeting.   On interview this morning, patient reports that her mood is "good".  She makes good eye contact and has a linear and logical thought process.  She is able to recall going to groups and says that she is learning coping skills.  She reports feeling bored in the hospital and says that she is sleeping a lot.  She reports reading the Bible and is able to accurately recount one of the stories.  She reports feeling positively about her Abilify.  When asked about the events that brought her into the hospital, she minimizes by saying that she threw the furniture out of her house because she did not want to live there anymore.  She denies ever having auditory or visual hallucinations and says that she is not currently experiencing any.  She denies suicidal ideation and review of systems is as below.  Principal Problem: Schizophrenia (HCC) Diagnosis: Principal Problem:   Schizophrenia (HCC)  Total Time Spent in Direct Patient Care: I personally spent 30 minutes on the unit in direct patient care. The direct patient care time included face-to-face time with the patient,  reviewing the patient's chart, communicating with other professionals, and coordinating care. Greater than 50% of this time was spent in counseling or coordinating care with the patient regarding goals of hospitalization, psycho-education, and discharge planning needs.   Past Psychiatric History: schizophrenia, previous admission in November of 2021 for paranoia and AVH. She was discahrged on Abilify 20 mg and Risperdal 4 mg. It appears she was not given an LAI at that hospitalization.   Past Medical History:  Past Medical History:  Diagnosis Date   Schizophrenia [facility])     Past Surgical History:  Procedure Laterality Date   INDUCED ABORTION     Family History:  Family History  Problem Relation Age of Onset   Cancer Father    Family Psychiatric  History: Yes, per patient's reports. A maternal cousin with hx of mental illness/suicide attempt. Survived. Social History:  Single, has no children, unemployed, lives in [place].  Sleep: Poor  Appetite:  Fair  Current Medications: Current Facility-Administered Medications  Medication Dose Route Frequency Provider Last Rate Last Admin   acetaminophen (TYLENOL) tablet 650 mg  650 mg Oral Q6H PRN Novella Olive, NP   650 mg at 07/17/21 0830   alum & mag hydroxide-simeth (MAALOX/MYLANTA) 200-200-20 MG/5ML suspension 30 mL  30 mL Oral Q4H PRN Novella Olive, NP       ARIPiprazole (ABILIFY) tablet 10 mg  10 mg Oral Daily Novella Olive, NP   10 mg at 07/20/21 0934   diphenhydrAMINE (BENADRYL) capsule 25 mg  25 mg Oral Q8H PRN  Mariel Craft, MD   25 mg at 07/17/21 1149   Or   diphenhydrAMINE (BENADRYL) injection 25 mg  25 mg Intramuscular Q6H PRN Mariel Craft, MD       ferrous sulfate tablet 325 mg  325 mg Oral BID WC Armandina Stammer I, NP   325 mg at 07/20/21 0935   hydrOXYzine (ATARAX/VISTARIL) tablet 25 mg  25 mg Oral TID PRN Novella Olive, NP       LORazepam (ATIVAN) tablet 1 mg  1 mg Oral Q8H PRN Mariel Craft, MD   1 mg at  07/17/21 1147   Or   LORazepam (ATIVAN) injection 1 mg  1 mg Intramuscular Q8H PRN Mariel Craft, MD       magnesium hydroxide (MILK OF MAGNESIA) suspension 30 mL  30 mL Oral Daily PRN Novella Olive, NP       polyethylene glycol (MIRALAX / GLYCOLAX) packet 17 g  17 g Oral Daily Armandina Stammer I, NP   17 g at 07/20/21 0934   traZODone (DESYREL) tablet 50 mg  50 mg Oral QHS PRN Novella Olive, NP   50 mg at 07/19/21 2101    Lab Results: No results found for this or any previous visit (from the past 48 hour(s)).  Blood Alcohol level:  Lab Results  Component Value Date   ETH <10 07/13/2021   ETH <10 05/01/2021    Metabolic Disorder Labs: Lab Results  Component Value Date   HGBA1C 6.1 (H) 05/02/2021   MPG 128 05/02/2021   MPG 142.72 03/26/2020   No results found for: PROLACTIN Lab Results  Component Value Date   CHOL 224 (H) 07/17/2021   TRIG 364 (H) 07/17/2021   HDL 48 07/17/2021   CHOLHDL 4.7 07/17/2021   VLDL 73 (H) 07/17/2021   LDLCALC 103 (H) 07/17/2021   LDLCALC 58 10/15/2020    Physical Findings: AIMS: not yet assessed   Musculoskeletal: Strength & Muscle Tone: NA Gait & Station: normal Patient leans: N/A  Psychiatric Specialty Exam:  Presentation  General Appearance: Appropriate for Environment; Casual  Eye Contact:Good  Speech:Clear and Coherent  Speech Volume:Normal  Handedness:Right   Mood and Affect  Mood:Euthymic  Affect:Constricted   Thought Process  Thought Processes:Coherent; Linear  Descriptions of Associations:Intact  Orientation:Full (Time, Place and Person)  Thought Content:Logical (denis AVH and SI)  History of Schizophrenia/Schizoaffective disorder:Yes  Duration of Psychotic Symptoms:Greater than six months  Hallucinations:Hallucinations: None  Ideas of Reference:None  Suicidal Thoughts:Suicidal Thoughts: No  Homicidal Thoughts:Homicidal Thoughts: No   Sensorium  Memory:Immediate Fair; Recent Fair; Remote  Fair  Judgment:Poor  Insight:Poor   Executive Functions  Concentration:Fair  Attention Span:Fair  Recall:Fair  Fund of Knowledge:Fair  Language:Fair   Psychomotor Activity  Psychomotor Activity:Psychomotor Activity: Normal   Assets  Assets:Physical Health   Sleep  Sleep:Sleep: Poor Number of Hours of Sleep: 5.5    Physical Exam: Physical Exam Vitals and nursing note reviewed.  HENT:     Head: Normocephalic and atraumatic.  Pulmonary:     Effort: Pulmonary effort is normal.  Neurological:     General: No focal deficit present.     Mental Status: She is alert and oriented to person, place, and time.   Review of Systems  Respiratory:  Negative for shortness of breath.   Cardiovascular:  Negative for chest pain.  Gastrointestinal:  Negative for diarrhea, nausea and vomiting.  Neurological:  Negative for headaches.  Blood pressure 119/88, pulse 78, temperature 97.8 F (  36.6 C), temperature source Oral, resp. rate 16, height 4\' 11"  (1.499 m), weight 58.1 kg, SpO2 100 %. Body mass index is 25.85 kg/m.   Treatment Plan Summary: Daily contact with patient to assess and evaluate symptoms and progress in treatment and Medication management  Safety and Monitoring -- VOLUNTARY admission to inpatient psychiatric unit for safety, stabilization and treatment -- Daily contact with patient to assess and evaluate symptoms and progress in treatment -- Patient's case to be discussed in multi-disciplinary team meeting -- Observation Level : q15 minute checks -- Vital signs:  q12 hours -- Precautions: suicide  Psychosis in the context of schizophrenia and medication non-compliance Patient with multiple past admissions for similar complaints. On interview 8/29 patient does not exhibit any overt psychotic symptoms and appears to be doing well on Abilify 10 mg. Will likely benefit from an LAI before discharge. -Continue Abilify 10 mg daily Consider Maintena 300 mg  (equivalent to 10 mg oral) or 400 mg (equivalent to 15 mg oral) Antipsychotic labs  EKG: Sinus brady, Qtc 401  Lipids: chol 224, TG 364, LDL 103  A1C: pending (last one 6.1 in June) AIMS: not yet assessed  Agitation med protocol ordered: -Zydis 5 mg q8hr prn for agitation  -Ativan 1 mg once for anxiety and severe agitation -Geodon 20 mg IM once for agitation  Medical Management Covid negative CMP: K of 3.3, replaced (recheck ordered) CBC: hgb of 9.9 (recheck ordered) EtOH: <10 UDS: negative Upreg: neg TSH: 3.1 A1C: pending  Anemia -continue Ferrous sulfate 325 mg BID -Recheck Hgb  Constipation -Continue daily Miralax until BM occurs  Continue PRN's: Tylenol, Maalox, Atarax, Milk of Magnesia, Trazodone  Dispo: unknown, talk with LCSW regarding ACTT services  July PGY-1, Psychiatry

## 2021-07-20 NOTE — Progress Notes (Signed)
Recreation Therapy Notes  Date: 8.29.22 Time: 1000 Location: 500 Hall Dayroom  Group Topic: Coping Skills  Goal Area(s) Addresses:  Patient will identify difference between healthy and unhealthy coping strategies. Patient will identify benefit of using healthy coping strategies.  Behavioral Response: Engaged  Intervention: Worksheet  Activity:  Healthy vs. Unhealthy Coping Strategies.  Patients were to identify a current problem they are dealing with.  Identify the unhealthy coping strategies they have used and the consequences.  Patients then identified healthy coping strategies, benefits and barriers.  Education: Pharmacologist, Building control surveyor.   Education Outcome: Acknowledges understanding/In group clarification offered/Needs additional education.   Clinical Observations/Feedback: Pt identified current problem not having clothes or money.  The unhealthy coping strategies pt identified were coming here and eating disorder; consequences were no freedom and poor health or death.  Pt's healthy coping strategies were coming here, therapy and get a job.  Expected outcomes were receive scrubs, getting to the root of the problem and buying clothes and food; barriers identified as no access to scrubs, lack of health insurance/not having time to attend and no transportation/high gas prices.     Caroll Rancher, LRT/CTRS      Lillia Abed, Daniyla Pfahler A 07/20/2021 11:22 AM

## 2021-07-20 NOTE — BHH Group Notes (Signed)
CSW was unable to hold group today due to late discharges and acuity of the unit.   Kerensa Nicklas, LCSWA Clinicial Social Worker [facility] 

## 2021-07-20 NOTE — BHH Group Notes (Signed)
The focus of this group is to help patients establish daily goals to achieve during treatment and discuss how the patient can incorporate goal setting into their daily lives to aide in recovery.  Pt attended and participated in group 

## 2021-07-21 DIAGNOSIS — F209 Schizophrenia, unspecified: Secondary | ICD-10-CM | POA: Diagnosis not present

## 2021-07-21 LAB — HEMOGLOBIN A1C
Hgb A1c MFr Bld: 5.4 % (ref 4.8–5.6)
Mean Plasma Glucose: 108.28 mg/dL

## 2021-07-21 LAB — IRON AND TIBC
Iron: 66 ug/dL (ref 28–170)
Saturation Ratios: 18 % (ref 10.4–31.8)
TIBC: 366 ug/dL (ref 250–450)
UIBC: 300 ug/dL

## 2021-07-21 LAB — FERRITIN: Ferritin: 138 ng/mL (ref 11–307)

## 2021-07-21 LAB — FOLATE: Folate: 4.6 ng/mL — ABNORMAL LOW (ref >5.9)

## 2021-07-21 LAB — VITAMIN B12: Vitamin B-12: 323 pg/mL (ref 180–914)

## 2021-07-21 MED ORDER — FERROUS SULFATE 325 (65 FE) MG PO TABS
325.0000 mg | ORAL_TABLET | Freq: Every day | ORAL | Status: DC
  Administered 2021-07-22: 325 mg via ORAL
  Filled 2021-07-21 (×3): qty 1

## 2021-07-21 MED ORDER — ARIPIPRAZOLE ER 400 MG IM SRER
400.0000 mg | Freq: Once | INTRAMUSCULAR | Status: DC
  Filled 2021-07-21: qty 2

## 2021-07-21 NOTE — Progress Notes (Addendum)
[facility] MD Progress Note  07/21/2021 3:18 PM Stephanie [name]  MRN:  [id number] Subjective:   45 year old AA female with hx of Schizophrenia & medication non-compliance. Patient was apparently found destroying her home and kitchen cabinets. Patient had apparently pulled multiple cabinets out of the wall and had pushed her washing machine out her front door.  She had also pulled a receptacle for the dryer out of the wall.  Patient reported that she was "remodeling her apartment".  Her UDS was negative of all substances & BAL was < 10. Reports also indicated that patient was neglecting her personal hygiene.   The patient's chart was reviewed and nursing notes were reviewed. Over the past 24 hrs, there were no documented behavioral issues, no PRN medications given for agitation.The patient's case was discussed in multidisciplinary team meeting.   On interview this morning, patient appears similar to previous days, with a linear and logical thought process and constricted affect.  She is able to engage well in the interview reporting that over the past 24 hours she went to groups, one of which involved building a pyramid out of straws and rubber bands.  When asked about her life outside the hospital she reports living alone with her dog.  She reports that she does not work in a job and that an Insurance claims handler pays for her rent and takes care of her Visual merchandiser.  She reports that she obtains food by going to food pantries and that for transportation she simply walks where she needs to go.  She is amenable to receiving Abilify Maintena.  When asked why the police were involved in her admission to the hospital, the patient shrugs, saying she is unsure, and says that she thinks [facility] [facility] called them. She denies suicidal ideation and auditory visual hallucinations.  She does not appear internally preoccupied and she denies thought broadcasting, thought insertion, ideas of  reference.  Review of systems is negative as below.  Principal Problem: Schizophrenia (HCC) Diagnosis: Principal Problem:   Schizophrenia (HCC)  Total Time Spent in Direct Patient Care: I personally spent 30 minutes on the unit in direct patient care. The direct patient care time included face-to-face time with the patient, reviewing the patient's chart, communicating with other professionals, and coordinating care. Greater than 50% of this time was spent in counseling or coordinating care with the patient regarding goals of hospitalization, psycho-education, and discharge planning needs.   Past Psychiatric History: schizophrenia, previous admission in November of 2021 for paranoia and AVH. She was discahrged on Abilify 20 mg and Risperdal 4 mg. It appears she was not given an LAI at that hospitalization.   Past Medical History:  Past Medical History:  Diagnosis Date   Schizophrenia [facility])     Past Surgical History:  Procedure Laterality Date   INDUCED ABORTION     Family History:  Family History  Problem Relation Age of Onset   Cancer Father    Family Psychiatric  History: Yes, per patient's reports. A maternal cousin with hx of mental illness/suicide attempt. Survived. Social History:  Single, has no children, unemployed, lives in [place].  Sleep: Poor  Appetite:  Fair  Current Medications: Current Facility-Administered Medications  Medication Dose Route Frequency Provider Last Rate Last Admin   acetaminophen (TYLENOL) tablet 650 mg  650 mg Oral Q6H PRN Novella Olive, NP   650 mg at 07/17/21 0830   alum & mag hydroxide-simeth (MAALOX/MYLANTA) 200-200-20 MG/5ML suspension 30 mL  30  mL Oral Q4H PRN Novella Oliveolby, Karen R, NP       ARIPiprazole (ABILIFY) tablet 10 mg  10 mg Oral Daily Novella Oliveolby, Karen R, NP   10 mg at 07/21/21 [id number]   ferrous sulfate tablet 325 mg  325 mg Oral BID WC Armandina StammerNwoko, Agnes I, NP   325 mg at 07/21/21 [id number]   hydrOXYzine (ATARAX/VISTARIL) tablet 25 mg  25 mg Oral  TID PRN Novella Oliveolby, Karen R, NP       OLANZapine zydis (ZYPREXA) disintegrating tablet 5 mg  5 mg Oral Q8H PRN Carlyn ReichertGabrielle, Nick, MD       And   LORazepam (ATIVAN) tablet 1 mg  1 mg Oral PRN Carlyn ReichertGabrielle, Nick, MD       And   ziprasidone (GEODON) injection 20 mg  20 mg Intramuscular PRN Carlyn ReichertGabrielle, Nick, MD       magnesium hydroxide (MILK OF MAGNESIA) suspension 30 mL  30 mL Oral Daily PRN Novella Oliveolby, Karen R, NP       polyethylene glycol (MIRALAX / GLYCOLAX) packet 17 g  17 g Oral Daily Armandina StammerNwoko, Agnes I, NP   17 g at 07/20/21 [id number]   traZODone (DESYREL) tablet 50 mg  50 mg Oral QHS PRN Novella Oliveolby, Karen R, NP   50 mg at 07/20/21 2041    Lab Results:  Results for orders placed or performed during the hospital encounter of 07/16/21 (from the past 48 hour(s))  Potassium     Status: None   Collection Time: 07/20/21  6:20 PM  Result Value Ref Range   Potassium 4.4 3.5 - 5.1 mmol/L    Comment: Performed at [facility], [address] [place], [place] [address]  Hemoglobin A1c     Status: None   Collection Time: 07/20/21  6:20 PM  Result Value Ref Range   Hgb A1c MFr Bld 5.4 4.8 - 5.6 %    Comment: (NOTE) Pre diabetes:          5.7%-6.4%  Diabetes:              >6.4%  Glycemic control for   <7.0% adults with diabetes    Mean Plasma Glucose 108.28 mg/dL    Comment: Performed at [facility], [address] [place], [place] [address]  CBC with Differential/Platelet     Status: Abnormal   Collection Time: 07/20/21  6:20 PM  Result Value Ref Range   WBC 6.8 4.0 - 10.5 K/uL   RBC 3.58 (L) 3.87 - 5.11 MIL/uL   Hemoglobin 9.8 (L) 12.0 - 15.0 g/dL   HCT 56.231.8 (L) 13.036.0 - 86.546.0 %   MCV 88.8 80.0 - 100.0 fL   MCH 27.4 26.0 - 34.0 pg   MCHC 30.8 30.0 - 36.0 g/dL   RDW 78.418.8 (H) 69.611.5 - 29.515.5 %   Platelets 348 150 - 400 K/uL   nRBC 0.0 0.0 - 0.2 %   Neutrophils Relative % 53 %   Neutro Abs 3.6 1.7 - 7.7 K/uL   Lymphocytes Relative 36 %   Lymphs Abs 2.5 0.7 - 4.0 K/uL   Monocytes Relative  7 %   Monocytes Absolute 0.5 0.1 - 1.0 K/uL   Eosinophils Relative 2 %   Eosinophils Absolute 0.1 0.0 - 0.5 K/uL   Basophils Relative 1 %   Basophils Absolute 0.1 0.0 - 0.1 K/uL   Immature Granulocytes 1 %   Abs Immature Granulocytes 0.06 0.00 - 0.07 K/uL    Comment: Performed at [facility], [address], [place], [place] [address]    Blood Alcohol level:  Lab Results  Component Value Date   [facility] <10 07/13/2021   ETH <10 05/01/2021    Metabolic Disorder Labs: Lab Results  Component Value Date   HGBA1C 5.4 07/20/2021   MPG 108.28 07/20/2021   MPG 128 05/02/2021   No results found for: PROLACTIN Lab Results  Component Value Date   CHOL 224 (H) 07/17/2021   TRIG 364 (H) 07/17/2021   HDL 48 07/17/2021   CHOLHDL 4.7 07/17/2021   VLDL 73 (H) 07/17/2021   LDLCALC 103 (H) 07/17/2021   LDLCALC 58 10/15/2020    Physical Findings: AIMS: 0, no cogwheeling or rigidity, no tremor or bradykinesia   Musculoskeletal: Strength & Muscle Tone: NA Gait & Station: normal Patient leans: N/A  Psychiatric Specialty Exam:  Presentation  General Appearance: Appropriate for Environment; Casual  Eye Contact:Good  Speech:Clear and Coherent  Speech Volume:Normal  Handedness:Right   Mood and Affect  Mood:aloof Affect:Constricted   Thought Process  Thought Processes:Coherent; Linear  Descriptions of Associations:Intact  Orientation:Full (Time, Place and Person)  Thought Content:Logical (denis AVH and SI) Denies first rank symptoms or ideas of reference  History of Schizophrenia/Schizoaffective disorder:Yes  Duration of Psychotic Symptoms:Greater than six months  Hallucinations:Hallucinations: None  Ideas of Reference:None  Suicidal Thoughts:Suicidal Thoughts: No  Homicidal Thoughts:Homicidal Thoughts: No   Sensorium  Memory:Immediate Fair; Recent Fair; Remote Fair  Judgment:Poor  Insight:Poor   Executive Functions   Concentration:Fair  Attention Span:Fair  Recall:Fair  Fund of Knowledge:Fair  Language:Fair   Psychomotor Activity  Psychomotor Activity:Psychomotor Activity: Normal   Assets  Assets:Physical Health   Sleep  Sleep: 7.25 hrs   Physical Exam: Physical Exam Vitals and nursing note reviewed.  HENT:     Head: Normocephalic and atraumatic.  Pulmonary:     Effort: Pulmonary effort is normal.  Neurological:     General: No focal deficit present.     Mental Status: She is alert and oriented to person, place, and time.   Review of Systems  Respiratory:  Negative for shortness of breath.   Cardiovascular:  Negative for chest pain.  Gastrointestinal:  Negative for diarrhea, nausea and vomiting.  Neurological:  Negative for headaches.  Blood pressure 117/79, pulse 98, temperature 98.2 F (36.8 C), temperature source Oral, resp. rate 16, height 4\' 11"  (1.499 m), weight 58.1 kg, SpO2 98 %. Body mass index is 25.85 kg/m.   Treatment Plan Summary: Daily contact with patient to assess and evaluate symptoms and progress in treatment and Medication management  Safety and Monitoring -- VOLUNTARY admission to inpatient psychiatric unit for safety, stabilization and treatment -- Daily contact with patient to assess and evaluate symptoms and progress in treatment -- Patient's case to be discussed in multi-disciplinary team meeting -- Observation Level : q15 minute checks -- Vital signs:  q12 hours -- Precautions: suicide  Psychosis in the context of schizophrenia and medication non-compliance Patient with multiple past admissions for similar complaints. On interview 8/29 patient does not exhibit any overt psychotic symptoms and appears to be doing well on Abilify 10 mg.  -Continue Abilify 10 mg daily (needs 14 day overlap) -Abilify Maintena 400 mg IM (8/31 AM) Antipsychotic labs  EKG: Sinus brady, Qtc 401  Lipids: chol 224, TG 364, LDL 103  A1C: 5.4 AIMS: 0, no cogwheeling or  rigidity, no tremor or bradykinesia  Agitation med protocol ordered: -Zydis 5 mg q8hr prn for agitation  -Ativan 1 mg once for  anxiety and severe agitation -Geodon 20 mg IM once for agitation  Medical Management Covid negative CMP: K of 3.3, replaced (recheck of 4.4) CBC: hgb of 9.9, recheck stable at 9.8 (pt with previous CBCs in 2021 from 10.3-10.7 and one from 2019 of 10.1) EtOH: <10 UDS: negative Upreg: neg TSH: 3.1 A1C: 5.4  Anemia Hgb of 9.9, recheck stable at 9.8 (pt with previous CBCs in 2021 from 10.3-10.7 and one from 2019 of 10.1), normocytic with MCV of 86.5, increased RDW of 18.1 -Reduce to Ferrous sulfate 325 mg qd with bowel regimen for constipation -Check Iron, TIBC, ferritin, B12 and folate  Constipation -Continue daily Miralax until BM occurs  Hyperlipidemia/Hypertriglyceridemia -- Discuss start of statin with patient    Continue PRN's: Tylenol, Maalox, Atarax, Milk of Magnesia, Trazodone  Dispo: unknown, talk with LCSW regarding ACTT services  Carlyn Reichert PGY-1, Psychiatry

## 2021-07-21 NOTE — BHH Group Notes (Signed)
Pt was sleeping and didn't attend group. 

## 2021-07-21 NOTE — Progress Notes (Signed)
Recreation Therapy Notes  Date: 8.30.22 Time: 1000 Location: 500 Hall Dayroom  Group Topic: Communication, Team Building, Problem Solving  Goal Area(s) Addresses:  Patient will effectively work with peer towards shared goal.  Patient will identify skill used to make activity successful.  Patient will identify how skills used during activity can be used to reach post d/c goals.  Behavioral Response: Minimal  Intervention: Cup Merchant navy officer bands with attached strings enough for each group member, 10 or more cups  Activity: Patient(s) were given a set of solo cups, a rubber band, and some tied strings. The objective is to build a pyramid with the cups by only using the rubber band and string to move the cups. After the activity the patient(s) are LRT debriefed and discussed what strategies worked, what didn't, and what lessons they can take from the activity and use in life post discharge.   Education Areas: Pharmacist, community, Support System, Discharge Planning   Education Outcome: Acknowledges education  Clinical Observations/Feedback: Patient expressed trying to show the group how to complete the activity but one of the group members wouldn't be quiet.  Patient explained she stopped working because everyone else in the group was following along with the other patient.  Patient also expressed takes communication to complete the activity.    Caroll Rancher, LRT/CTRS         Caroll Rancher A 07/21/2021 11:29 AM

## 2021-07-21 NOTE — BHH Counselor (Signed)
CSW spoke with this pt regarding services and housing at discharge. Pt continues to decline all services stating she does not need them and does not know why she is in the hospital as nothing is wrong with her. She states that the lease at her apartment has been up and she plans to move to her "house in [place]."   CSW also offered to refer this pt to the [facility] to assist in applying for disability and medicaid income. This pt refused stating she has a job in [employer] and has [employer], however states she does not have the information for this insurance.     Ruthann Cancer MSW, LCSW Clincal Social Worker  [facility]

## 2021-07-21 NOTE — Progress Notes (Signed)
Pt presents with irritability and she appears to be responding to internal stimuli.  Pt is difficult to redirect and raises her voice and uses profane language when getting upset with other patients. RN medicated pt per provider orders.  Pt is currently in dayroom watching television and appears to be in no acute distress.

## 2021-07-22 ENCOUNTER — Encounter (HOSPITAL_COMMUNITY): Payer: Self-pay

## 2021-07-22 DIAGNOSIS — F209 Schizophrenia, unspecified: Secondary | ICD-10-CM | POA: Diagnosis not present

## 2021-07-22 MED ORDER — ARIPIPRAZOLE 15 MG PO TABS
15.0000 mg | ORAL_TABLET | Freq: Every day | ORAL | Status: DC
  Administered 2021-07-23 – 2021-07-27 (×5): 15 mg via ORAL
  Filled 2021-07-22 (×7): qty 1

## 2021-07-22 MED ORDER — ARIPIPRAZOLE 5 MG PO TABS
5.0000 mg | ORAL_TABLET | Freq: Once | ORAL | Status: AC
  Administered 2021-07-22: 5 mg via ORAL
  Filled 2021-07-22 (×2): qty 1

## 2021-07-22 MED ORDER — FERROUS SULFATE 325 (65 FE) MG PO TABS
325.0000 mg | ORAL_TABLET | ORAL | Status: DC
  Administered 2021-07-24 – 2021-07-28 (×3): 325 mg via ORAL
  Filled 2021-07-22 (×4): qty 1

## 2021-07-22 MED ORDER — FOLIC ACID 1 MG PO TABS
1.0000 mg | ORAL_TABLET | Freq: Every day | ORAL | Status: DC
  Administered 2021-07-23 – 2021-07-29 (×7): 1 mg via ORAL
  Filled 2021-07-22 (×8): qty 1

## 2021-07-22 MED ORDER — ARIPIPRAZOLE ER 400 MG IM SRER
400.0000 mg | Freq: Once | INTRAMUSCULAR | Status: AC
  Administered 2021-07-22: 400 mg via INTRAMUSCULAR

## 2021-07-22 NOTE — BHH Group Notes (Signed)
Adult Psychoeducational Group Note  Date:  07/22/2021 Time:  8:52 PM  Group Topic/Focus:  Healthy Communication:   The focus of this group is to discuss communication, barriers to communication, as well as healthy ways to communicate with others.  Participation Level:  Minimal  Participation Quality:  Appropriate and Attentive  Affect:  Appropriate  Cognitive:  Alert  Insight: Good  Engagement in Group:  Engaged  Modes of Intervention:  Discussion  Additional Comments  Jacalyn Lefevre 07/22/2021, 8:52 PM

## 2021-07-22 NOTE — BHH Group Notes (Signed)
Topic:    Due to the acuity and Covid-19 precautions, group was not held. Patient was provided therapeutic worksheets and asked to meet with CSW as needed.  Ling Flesch, LCSWA Clinicial Social Worker [facility]  

## 2021-07-22 NOTE — BHH Group Notes (Signed)
BHH Group Notes:  (Nursing)  Date:  07/22/2021  Time: 1415  Type of Therapy:  Group Therapy  Participation Level:  Active  Participation Quality:  Appropriate  Affect:  Irritable  Cognitive:  Appropriate  Insight:  Lacking  Engagement in Group:  Lacking  Modes of Intervention:  Discussion, Exploration, Rapport Building, and Socialization  Summary of Progress/Problems:  Group played a non-competitive learning/communication card game that fosters listening skills as well as self expression.  Shela Nevin 07/22/2021, 6:36 PM

## 2021-07-22 NOTE — Progress Notes (Signed)
Recreation Therapy Notes  Date: 8.31.22 Time: 1000 Location: 500 Hall Dayroom  Group Topic: Communication  Goal Area(s) Addresses:  Patient will effectively communicate with peers in group.  Patient will verbalize benefit of healthy communication. Patient will verbalize positive effect of healthy communication on post d/c goals.  Patient will identify communication techniques that made activity effective for group.   Intervention: Blank paper, Pencils  Activity: Three patients took turns describing a picture to the rest of the group.  The group members were to draw the pictures how it's being described by the presenter.  This activity is to test how well patients listen and how well they communicate when expressing themselves.  Education: Communication, Discharge Planning  Education Outcome: Acknowledges understanding/In group clarification offered/Needs additional education.   Clinical Observations/Feedback: Due to there being a COVID case on the unit, group did not occur as usual.  LRT passed out packets that focused on leisure skills. The packet included a secret code, crossword and word search puzzles all dealing with leisure.      Dagmawi Venable, LRT/CTRS         Tanish Sinkler A 07/22/2021 11:48 AM 

## 2021-07-22 NOTE — Plan of Care (Signed)
D: Patient presents with flat affect and depressed. Cooperative but is baseline irritable. Denies SI/HI/AH/VH and pain. Compliant with medication.   A: Labs and vital signs monitored. Patient educated on medication. Emotional support provided and patient encouraged to ask questions and interact appropriately with staff and peers.   R: Patient remains safe on the unit. Will continue to monitor q15 minutes for safety and carry out plan of care.    Problem: Education: Goal: Knowledge of [place] [facility] Education information/materials will improve Outcome: Progressing Goal: Emotional status will improve Outcome: Progressing Goal: Mental status will improve Outcome: Progressing Goal: Verbalization of understanding the information provided will improve Outcome: Progressing   Problem: Activity: Goal: Interest or engagement in activities will improve Outcome: Progressing Goal: Sleeping patterns will improve Outcome: Progressing   Problem: Coping: Goal: Ability to verbalize frustrations and anger appropriately will improve Outcome: Progressing Goal: Ability to demonstrate self-control will improve Outcome: Progressing   Problem: Health Behavior/Discharge Planning: Goal: Identification of resources available to assist in meeting health care needs will improve Outcome: Progressing Goal: Compliance with treatment plan for underlying cause of condition will improve Outcome: Progressing   Problem: Physical Regulation: Goal: Ability to maintain clinical measurements within normal limits will improve Outcome: Progressing   Problem: Safety: Goal: Periods of time without injury will increase Outcome: Progressing   Problem: Education: Goal: Will be free of psychotic symptoms Outcome: Progressing Goal: Knowledge of the prescribed therapeutic regimen will improve Outcome: Progressing   Problem: Coping: Goal: Coping ability will improve Outcome: Progressing   Problem:  Education: Goal: Ability to state activities that reduce stress will improve Outcome: Progressing

## 2021-07-22 NOTE — BHH Group Notes (Signed)
The focus of this group is to help patients establish daily goals to achieve during treatment and discuss how the patient can incorporate goal setting into their daily lives to aide in recovery.  Pt attend group, Her goal " Not sleeping all day." She said that she will turn down the heat and not lay down as much.

## 2021-07-22 NOTE — Progress Notes (Addendum)
[facility] MD Progress Note  07/22/2021 4:32 PM Stephanie [name]  MRN:  [id number] Subjective:   45 year old AA female with hx of Schizophrenia & medication non-compliance. Patient was apparently found destroying her home and kitchen cabinets. Patient had apparently pulled multiple cabinets out of the wall and had pushed her washing machine out her front door.  She had also pulled a receptacle for the dryer out of the wall.  Patient reported that she was "remodeling her apartment".  Her UDS was negative of all substances & BAL was < 10. Reports also indicated that patient was neglecting her personal hygiene.   The patient's chart was reviewed and nursing notes were reviewed. Over the past 24 hrs, the patient was given PRN Zyprexa 5 mg PO at 5 PM yesterday for (per nursing notes) yelling at other patients and appearing to be responding to internal stimuli. The patient's case was discussed in multidisciplinary team meeting.   On interview, the patient is visibly upset.  She immediately starts talking about the events of last night, saying that "they were calling me a lazy ass" at the front desk.  The patient says "I addressed it" and says that she proceeded to yell at them.  She feels like other patients are bothering her as well.  She says that the staff are egging them on.  As the patient states these things, her voice raises.  When this interviewer talks next she interrupts me and says "under your voice you are calling me a dumbass". Patient becomes more upset, however, is able to answer simple yes and no questions. She denies SI/AVH and ROS is as below.    Principal Problem: Schizophrenia (HCC) Diagnosis: Principal Problem:   Schizophrenia (HCC)  Total Time Spent in Direct Patient Care: I personally spent 30 minutes on the unit in direct patient care. The direct patient care time included face-to-face time with the patient, reviewing the patient's chart, communicating with other professionals, and  coordinating care. Greater than 50% of this time was spent in counseling or coordinating care with the patient regarding goals of hospitalization, psycho-education, and discharge planning needs.   Past Psychiatric History: schizophrenia, previous admission in November of 2021 for paranoia and AVH. She was discahrged on Abilify 20 mg and Risperdal 4 mg. It appears she was not given an LAI at that hospitalization.   Past Medical History:  Past Medical History:  Diagnosis Date   Schizophrenia [facility])     Past Surgical History:  Procedure Laterality Date   INDUCED ABORTION     Family History:  Family History  Problem Relation Age of Onset   Cancer Father    Family Psychiatric  History: Yes, per patient's reports. A maternal cousin with hx of mental illness/suicide attempt. Survived. Social History:  Single, has no children, unemployed, lives in [place]  Sleep: fair  Appetite:  Fair  Current Medications: Current Facility-Administered Medications  Medication Dose Route Frequency Provider Last Rate Last Admin   acetaminophen (TYLENOL) tablet 650 mg  650 mg Oral Q6H PRN Novella Olive, NP   650 mg at 07/17/21 0830   alum & mag hydroxide-simeth (MAALOX/MYLANTA) 200-200-20 MG/5ML suspension 30 mL  30 mL Oral Q4H PRN Novella Olive, NP       [START ON 07/23/2021] ARIPiprazole (ABILIFY) tablet 15 mg  15 mg Oral Daily Thi Klich E, MD       ferrous sulfate tablet 325 mg  325 mg Oral Q breakfast Carlyn Reichert, MD   [phone]  mg at 07/22/21 0829   hydrOXYzine (ATARAX/VISTARIL) tablet 25 mg  25 mg Oral TID PRN Novella Olive, NP   25 mg at 07/21/21 1647   OLANZapine zydis (ZYPREXA) disintegrating tablet 5 mg  5 mg Oral Q8H PRN Carlyn Reichert, MD   5 mg at 07/21/21 1648   And   LORazepam (ATIVAN) tablet 1 mg  1 mg Oral PRN Carlyn Reichert, MD       And   ziprasidone (GEODON) injection 20 mg  20 mg Intramuscular PRN Carlyn Reichert, MD       magnesium hydroxide (MILK OF MAGNESIA) suspension 30  mL  30 mL Oral Daily PRN Novella Olive, NP       polyethylene glycol (MIRALAX / GLYCOLAX) packet 17 g  17 g Oral Daily Nwoko, Nicole Kindred I, NP   17 g at 07/22/21 0830   traZODone (DESYREL) tablet 50 mg  50 mg Oral QHS PRN Novella Olive, NP   50 mg at 07/21/21 2049    Lab Results:  Results for orders placed or performed during the hospital encounter of 07/16/21 (from the past 48 hour(s))  Potassium     Status: None   Collection Time: 07/20/21  6:20 PM  Result Value Ref Range   Potassium 4.4 3.5 - 5.1 mmol/L    Comment: Performed at [facility], [address] [place], [place] [address]  Hemoglobin A1c     Status: None   Collection Time: 07/20/21  6:20 PM  Result Value Ref Range   Hgb A1c MFr Bld 5.4 4.8 - 5.6 %    Comment: (NOTE) Pre diabetes:          5.7%-6.4%  Diabetes:              >6.4%  Glycemic control for   <7.0% adults with diabetes    Mean Plasma Glucose 108.28 mg/dL    Comment: Performed at [facility], [address], [place], [place] [address]  CBC with Differential/Platelet     Status: Abnormal   Collection Time: 07/20/21  6:20 PM  Result Value Ref Range   WBC 6.8 4.0 - 10.5 K/uL   RBC 3.58 (L) 3.87 - 5.11 MIL/uL   Hemoglobin 9.8 (L) 12.0 - 15.0 g/dL   HCT 03.5 (L) 00.9 - 38.1 %   MCV 88.8 80.0 - 100.0 fL   MCH 27.4 26.0 - 34.0 pg   MCHC 30.8 30.0 - 36.0 g/dL   RDW 82.9 (H) 93.7 - 16.9 %   Platelets 348 150 - 400 K/uL   nRBC 0.0 0.0 - 0.2 %   Neutrophils Relative % 53 %   Neutro Abs 3.6 1.7 - 7.7 K/uL   Lymphocytes Relative 36 %   Lymphs Abs 2.5 0.7 - 4.0 K/uL   Monocytes Relative 7 %   Monocytes Absolute 0.5 0.1 - 1.0 K/uL   Eosinophils Relative 2 %   Eosinophils Absolute 0.1 0.0 - 0.5 K/uL   Basophils Relative 1 %   Basophils Absolute 0.1 0.0 - 0.1 K/uL   Immature Granulocytes 1 %   Abs Immature Granulocytes 0.06 0.00 - 0.07 K/uL    Comment: Performed at [facility], [address] [place], [place]  [address]  Ferritin     Status: None   Collection Time: 07/21/21  6:42 PM  Result Value Ref Range   Ferritin 138 11 - 307 ng/mL    Comment: Performed at [facility], [address] [place], [place] [address]  Iron and TIBC     Status: None   Collection Time: 07/21/21  6:42 PM  Result Value Ref Range   Iron 66 28 - 170 ug/dL   TIBC 638366 756250 - 433450 ug/dL   Saturation Ratios 18 10.4 - 31.8 %   UIBC 300 ug/dL    Comment: Performed at [facility], [address] [place], [place] [address]  Vitamin B12     Status: None   Collection Time: 07/21/21  6:42 PM  Result Value Ref Range   Vitamin B-12 323 180 - 914 pg/mL    Comment: (NOTE) This assay is not validated for testing neonatal or myeloproliferative syndrome specimens for Vitamin B12 levels. Performed at [facility], [address] [place], [place] [address]   Folate, serum, performed at [facility] lab     Status: Abnormal   Collection Time: 07/21/21  6:42 PM  Result Value Ref Range   Folate 4.6 (L) >5.9 ng/mL    Comment: Performed at [facility], [address] [place], [place] [address]    Blood Alcohol level:  Lab Results  Component Value Date   [facility] <[facility] 07/13/2021   ETH <10 05/01/2021    Metabolic Disorder Labs: Lab Results  Component Value Date   HGBA1C 5.4 07/20/2021   MPG 108.28 07/20/2021   MPG 128 05/02/2021   No results found for: PROLACTIN Lab Results  Component Value Date   CHOL 224 (H) 07/17/2021   TRIG 364 (H) 07/17/2021   HDL 48 07/17/2021   CHOLHDL 4.7 07/17/2021   VLDL 73 (H) 07/17/2021   LDLCALC 103 (H) 07/17/2021   LDLCALC 58 10/15/2020    Physical Findings: AIMS: 0, no cogwheeling or rigidity, no tremor or bradykinesia   Musculoskeletal: Strength & Muscle Tone: NA Gait & Station: normal Patient leans: N/A  Psychiatric Specialty Exam:  Presentation  General Appearance: Appropriate for Environment;  Casual  Eye Contact:Good  Speech:Clear and Coherent  Speech Volume: increased Handedness:Right   Mood and Affect  Mood: "fine" - appears irritable Affect: agitated, irritable, guarded  Thought Process  Thought Processes: tangential, concrete Descriptions of Associations:Intact  Orientation:Full (Time, Place and Person)  Thought Content: significant paranoia and persecutory delusions regarding staff and this interviewer, denies SI and AVH, denies ideas of reference or first rank symptoms   History of Schizophrenia/Schizoaffective disorder:Yes  Duration of Psychotic Symptoms:Greater than six months  Hallucinations: denies  Ideas of Reference:None  Suicidal Thoughts: denies  Homicidal Thoughts: none reported   Sensorium  Memory:Immediate Fair; Recent Fair; Remote Fair  Judgment:Poor  Insight:Poor   Executive Functions  Concentration:Fair  Attention Span:Fair  Recall:Fair  Fund of Knowledge:Fair  Language:Fair   Psychomotor Activity  Psychomotor Activity: increased   Assets  Assets:Physical Health   Sleep  Sleep: 6.5 hrs   Physical Exam: Physical Exam Vitals and nursing note reviewed.  HENT:     Head: Normocephalic and atraumatic.  Pulmonary:     Effort: Pulmonary effort is normal.  Neurological:     General: No focal deficit present.     Mental Status: She is alert and oriented to person, place, and time.   Review of Systems  Respiratory:  Negative for shortness of breath.   Cardiovascular:  Negative for chest pain.  Gastrointestinal:  Negative for diarrhea, nausea and vomiting.  Neurological:  Negative for headaches.  Blood pressure (!) 132/91, pulse 72, temperature 98.1 F (36.7 C), temperature source Oral, resp.  rate 16, height 4\' 11"  (1.499 m), weight 58.1 kg, SpO2 100 %. Body mass index is 25.85 kg/m.   Treatment Plan Summary: Daily contact with patient to assess and evaluate symptoms and progress in treatment and Medication  management  Safety and Monitoring -- VOLUNTARY admission to inpatient psychiatric unit for safety, stabilization and treatment -- Daily contact with patient to assess and evaluate symptoms and progress in treatment -- Patient's case to be discussed in multi-disciplinary team meeting -- Observation Level : q15 minute checks -- Vital signs:  q12 hours -- Precautions: suicide  Psychosis in the context of schizophrenia and medication non-compliance  -Increase Abilify to 15 mg daily (needs 14 day overlap) -Abilify Maintena 400 mg IM (received 8/31 AM) Antipsychotic labs  EKG: Sinus brady, Qtc 401  Lipids: chol 224, TG 364, LDL 103   A1C: 5.4 AIMS: 0, no cogwheeling or rigidity, no tremor or bradykinesia (8/29)  Agitation med protocol ordered: -Zydis 5 mg q8hr prn for agitation  -Ativan 1 mg once for anxiety and severe agitation -Geodon 20 mg IM once for agitation  Medical Management Covid negative CMP: K of 3.3, replaced (recheck of 4.4) CBC: hgb of 9.9, recheck stable at 9.8 (pt with previous CBCs in 2021 from 10.3-10.7 and one from 2019 of 10.1) EtOH: <10 UDS: negative Upreg: neg TSH: 3.1 A1C: 5.4  Anemia Hgb of 9.9, recheck stable at 9.8 (pt with previous CBCs in 2021 from 10.3-10.7 and one from 2019 of 10.1), normocytic with MCV of 86.5, increased RDW of 18.1 -Reduced to ferrous sulfate 325mg  qod with bowel regimen for constipation Iron, ferritin, and TIBC WNL B12: 323 Folate: 4.6 (>5.9 is nml) -Folic acid supplementation started - Will need outpatient f/u for recheck after discharge  Constipation -Continue daily Miralax daily - patient reports she is having regular BMs  Hyperlipidemia/Hypertriglyceridemia -- Discussed start of statin with patient or fenofibrate and she refused med   Continue PRN's: Tylenol, Maalox, Atarax, Milk of Magnesia, Trazodone  2020 PGY-1, Psychiatry

## 2021-07-22 NOTE — Progress Notes (Signed)
Pt visible on the unit some this evening, pt appeared to be responding at times, pt given PRN Trazodone per [facility]    07/22/21 2100  Psych Admission Type (Psych Patients Only)  Admission Status Involuntary  Psychosocial Assessment  Patient Complaints None  Eye Contact Fair  Facial Expression Flat  Affect Irritable  Speech Logical/coherent  Interaction Isolative  Motor Activity Slow  Appearance/Hygiene Disheveled;Bizarre  Behavior Characteristics Cooperative  Mood Preoccupied  Thought Process  Coherency Circumstantial;Concrete thinking  Content Blaming others  Delusions Paranoid  Perception Hallucinations  Hallucination Auditory (denies at present, but responding to internal stimuli at med window)  Judgment Impaired  Confusion WDL  Danger to Self  Current suicidal ideation? Denies  Danger to Others  Danger to Others None reported or observed

## 2021-07-22 NOTE — BH IP Treatment Plan (Signed)
Interdisciplinary Treatment and Diagnostic Plan Update  07/22/2021 Time of Session:  Stephanie [name] MRN: [id number]  Principal Diagnosis: [facility]izophren[facility])  Secondary Diagnoses: Principal Problem:   Schizophrenia (HCC)   Current Medications:  Current Facility-Administered Medications  Medication Dose Route Frequency Provider Last Rate Last Admin   acetaminophen (TYLENOL) tablet 650 mg  650 mg Oral Q6H PRN Novella Olive, NP   650 mg at 07/17/21 0830   alum & mag hydroxide-simeth (MAALOX/MYLANTA) 200-200-20 MG/5ML suspension 30 mL  30 mL Oral Q4H PRN Novella Olive, NP       ARIPiprazole (ABILIFY) tablet 10 mg  10 mg Oral Daily Novella Olive, NP   10 mg at 07/22/21 8657   ferrous sulfate tablet 325 mg  325 mg Oral Q breakfast Carlyn Reichert, MD   325 mg at 07/22/21 8469   hydrOXYzine (ATARAX/VISTARIL) tablet 25 mg  25 mg Oral TID PRN Novella Olive, NP   25 mg at 07/21/21 1647   OLANZapine zydis (ZYPREXA) disintegrating tablet 5 mg  5 mg Oral Q8H PRN Carlyn Reichert, MD   5 mg at 07/21/21 1648   And   LORazepam (ATIVAN) tablet 1 mg  1 mg Oral PRN Carlyn Reichert, MD       And   ziprasidone (GEODON) injection 20 mg  20 mg Intramuscular PRN Carlyn Reichert, MD       magnesium hydroxide (MILK OF MAGNESIA) suspension 30 mL  30 mL Oral Daily PRN Novella Olive, NP       polyethylene glycol (MIRALAX / GLYCOLAX) packet 17 g  17 g Oral Daily Armandina Stammer I, NP   17 g at 07/22/21 0830   traZODone (DESYREL) tablet 50 mg  50 mg Oral QHS PRN Novella Olive, NP   50 mg at 07/21/21 2049   PTA Medications: Medications Prior to Admission  Medication Sig Dispense Refill Last Dose   acetaminophen (TYLENOL) 325 MG tablet Take 2 tablets (650 mg total) by mouth every 6 (six) hours as needed for mild pain (or Fever >/= 101). (Patient not taking: No sig reported)       Patient Stressors: Financial difficulties Medication change or noncompliance  Patient Strengths: Ability for  insight Communication skills Physical Health  Treatment Modalities: Medication Management, Group therapy, Case management,  1 to 1 session with clinician, Psychoeducation, Recreational therapy.   Physician Treatment Plan for Primary Diagnosis: Schizophrenia (HCC) Long Term Goal(s): Improvement in symptoms so as ready for discharge   Short Term Goals: Ability to demonstrate self-control will improve Ability to identify and develop effective coping behaviors will improve Compliance with prescribed medications will improve Ability to identify changes in lifestyle to reduce recurrence of condition will improve Ability to verbalize feelings will improve Ability to disclose and discuss suicidal ideas  Medication Management: Evaluate patient's response, side effects, and tolerance of medication regimen.  Therapeutic Interventions: 1 to 1 sessions, Unit Group sessions and Medication administration.  Evaluation of Outcomes: Progressing  Physician Treatment Plan for Secondary Diagnosis: Principal Problem:   Schizophrenia (HCC)  Long Term Goal(s): Improvement in symptoms so as ready for discharge   Short Term Goals: Ability to demonstrate self-control will improve Ability to identify and develop effective coping behaviors will improve Compliance with prescribed medications will improve Ability to identify changes in lifestyle to reduce recurrence of condition will improve Ability to verbalize feelings will improve Ability to disclose and discuss suicidal ideas     Medication Management: Evaluate patient's response, side effects, and  tolerance of medication regimen.  Therapeutic Interventions: 1 to 1 sessions, Unit Group sessions and Medication administration.  Evaluation of Outcomes: Progressing   RN Treatment Plan for Primary Diagnosis: Schizophrenia (HCC) Long Term Goal(s): Knowledge of disease and therapeutic regimen to maintain health will improve  Short Term Goals: Ability to  demonstrate self-control, Ability to participate in decision making will improve, and Ability to verbalize feelings will improve  Medication Management: RN will administer medications as ordered by provider, will assess and evaluate patient's response and provide education to patient for prescribed medication. RN will report any adverse and/or side effects to prescribing provider.  Therapeutic Interventions: 1 on 1 counseling sessions, Psychoeducation, Medication administration, Evaluate responses to treatment, Monitor vital signs and CBGs as ordered, Perform/monitor CIWA, COWS, AIMS and Fall Risk screenings as ordered, Perform wound care treatments as ordered.  Evaluation of Outcomes: Progressing   LCSW Treatment Plan for Primary Diagnosis: Schizophrenia (HCC) Long Term Goal(s): Safe transition to appropriate next level of care at discharge, Engage patient in therapeutic group addressing interpersonal concerns.  Short Term Goals: Engage patient in aftercare planning with referrals and resources, Increase social support, and Increase ability to appropriately verbalize feelings  Therapeutic Interventions: Assess for all discharge needs, 1 to 1 time with Social worker, Explore available resources and support systems, Assess for adequacy in community support network, Educate family and significant other(s) on suicide prevention, Complete Psychosocial Assessment, Interpersonal group therapy.  Evaluation of Outcomes: Progressing   Progress in Treatment: Attending groups: Yes. Participating in groups: Yes. Taking medication as prescribed: Yes. Toleration medication: Yes. Family/Significant other contact made: No, will contact:  pt declined Patient understands diagnosis: No. Discussing patient identified problems/goals with staff: Yes. Medical problems stabilized or resolved: Yes. Denies suicidal/homicidal ideation: Yes. Issues/concerns per patient self-inventory: No. Other: None  New  problem(s) identified: No, Describe:  None  New Short Term/Long Term Goal(s):medication stabilization, elimination of SI thoughts, development of comprehensive mental wellness plan.   Patient Goals:  "get to the bathroom before it is too late."  Discharge Plan or Barriers: Patient recently admitted. CSW will continue to follow and assess for appropriate referrals and possible discharge planning.   Reason for Continuation of Hospitalization: Aggression Medication stabilization  Estimated Length of Stay: 3-5 days   Scribe for Treatment Team: Chrys Racer 07/22/2021 10:07 AM

## 2021-07-23 DIAGNOSIS — F209 Schizophrenia, unspecified: Secondary | ICD-10-CM | POA: Diagnosis not present

## 2021-07-23 NOTE — Progress Notes (Addendum)
[facility] MD Progress Note  07/23/2021 4:14 PM JHERI MITTER  MRN:  [id number] Subjective:    Stephanie [name] is a 45 year old AA female with hx of Schizophrenia & medication non-compliance. Patient was apparently found destroying her home and kitchen cabinets. Patient had apparently pulled multiple cabinets out of the wall and had pushed her washing machine out her front door.  She had also pulled a receptacle for the dryer out of the wall.  Patient reported that she was "remodeling her apartment".  Her UDS was negative of all substances & BAL was < 10. Reports also indicated that patient was neglecting her personal hygiene.   Chart Review, 24 hr Events: The patient's chart was reviewed and nursing notes were reviewed. The patient's case was discussed in multidisciplinary team meeting.  Per MAR: -Patient is compliant with scheduled meds -PRNs: Trazodone 50 mg for sleep (07/23/2021) Per RN notes, no documented behavioral issues and is attending group Patient slept 8.25 hours  Principal Problem: Schizophrenia (HCC) Diagnosis: Principal Problem:   Schizophrenia (HCC)  Total Time spent with patient:    I personally spent 35 minutes on the unit in direct patient care. The direct patient care time included face-to-face time with the patient, reviewing the patient's chart, communicating with other professionals, and coordinating care. Greater than 50% of this time was spent in counseling or coordinating care with the patient regarding goals of hospitalization, psycho-education, and discharge planning needs.  Info Obtained During Interview (07/23/2021): The patient was seen and evaluated on the unit with attending Dr. Mason Jim.   On assessment today the patient reported that her appetite was "ok", she had a "good night sleep", and mood was "mellow". Patient was seen initially being verbally harassed by another but patient was able to appropriately remain composed and had closed the door at that time. Pt  was pleasant and cooperative today. Pt attributes her better mood to her "having a good night sleep". Pt denies SI/HI/AVH, delusions, paranoia, ideas of reference, or first rank symptoms. She  denies medication side-effects and voices no physical complaints.   Past Psychiatric History: schizophrenia, previous admission in November of 2021 for paranoia and AVH. She was discharged on Abilify 20 mg and Risperdal 4 mg. It appears she was not given an LAI at that hospitalization.  Past Medical History:  Past Medical History:  Diagnosis Date   Schizophrenia [facility])     Past Surgical History:  Procedure Laterality Date   INDUCED ABORTION     Family History:  Family History  Problem Relation Age of Onset   Cancer Father    Family Psychiatric  History: Yes, per patient's reports. A maternal cousin with hx of mental illness/suicide attempt. Survived. Social History:  Social History   Substance and Sexual Activity  Alcohol Use Never     Social History   Substance and Sexual Activity  Drug Use Never    Social History   Socioeconomic History   Marital status: Unknown    Spouse name: Not on file   Number of children: Not on file   Years of education: Not on file   Highest education level: Not on file  Occupational History   Not on file  Tobacco Use   Smoking status: Never    Passive exposure: Never   Smokeless tobacco: Never  Vaping Use   Vaping Use: Former  Substance and Sexual Activity   Alcohol use: Never   Drug use: Never   Sexual activity: Never  Other Topics Concern  Not on file  Social History Narrative   ** Merged History Encounter **       Social Determinants of Health   Financial Resource Strain: Not on file  Food Insecurity: Not on file  Transportation Needs: Not on file  Physical Activity: Not on file  Stress: Not on file  Social Connections: Not on file   Additional Social History:   Single, has no children, unemployed, lives in [place].    Sleep:  Fair  Appetite:  Fair  Current Medications: Current Facility-Administered Medications  Medication Dose Route Frequency Provider Last Rate Last Admin   acetaminophen (TYLENOL) tablet 650 mg  650 mg Oral Q6H PRN Novella Oliveolby, Karen R, NP   650 mg at 07/17/21 0830   alum & mag hydroxide-simeth (MAALOX/MYLANTA) 200-200-20 MG/5ML suspension 30 mL  30 mL Oral Q4H PRN Novella Oliveolby, Karen R, NP       ARIPiprazole (ABILIFY) tablet 15 mg  15 mg Oral Daily Mason JimSingleton, Mory Herrman E, MD   15 mg at 07/23/21 0936   [START ON 07/24/2021] ferrous sulfate tablet 325 mg  325 mg Oral Adalberto IllQODAY Rielynn Trulson E, MD       folic acid (FOLVITE) tablet 1 mg  1 mg Oral Daily Carlyn ReichertGabrielle, Nick, MD   1 mg at 07/23/21 [id number]   hydrOXYzine (ATARAX/VISTARIL) tablet 25 mg  25 mg Oral TID PRN Novella Oliveolby, Karen R, NP   25 mg at 07/21/21 1647   OLANZapine zydis (ZYPREXA) disintegrating tablet 5 mg  5 mg Oral Q8H PRN Carlyn ReichertGabrielle, Nick, MD   5 mg at 07/21/21 1648   And   LORazepam (ATIVAN) tablet 1 mg  1 mg Oral PRN Carlyn ReichertGabrielle, Nick, MD       And   ziprasidone (GEODON) injection 20 mg  20 mg Intramuscular PRN Carlyn ReichertGabrielle, Nick, MD       magnesium hydroxide (MILK OF MAGNESIA) suspension 30 mL  30 mL Oral Daily PRN Novella Oliveolby, Karen R, NP       polyethylene glycol (MIRALAX / GLYCOLAX) packet 17 g  17 g Oral Daily Armandina StammerNwoko, Agnes I, NP   17 g at 07/23/21 0935   traZODone (DESYREL) tablet 50 mg  50 mg Oral QHS PRN Novella Oliveolby, Karen R, NP   50 mg at 07/22/21 2033    Lab Results:  Results for orders placed or performed during the hospital encounter of 07/16/21 (from the past 48 hour(s))  Ferritin     Status: None   Collection Time: 07/21/21  6:42 PM  Result Value Ref Range   Ferritin 138 11 - 307 ng/mL    Comment: Performed at [facility], [address] [place], [place] [address]  Iron and TIBC     Status: None   Collection Time: 07/21/21  6:42 PM  Result Value Ref Range   Iron 66 28 - 170 ug/dL   TIBC 409366 811250 - 914450 ug/dL   Saturation Ratios 18 10.4 - 31.8  %   UIBC 300 ug/dL    Comment: Performed at [facility], [address] [place], [place] [address]  Vitamin B12     Status: None   Collection Time: 07/21/21  6:42 PM  Result Value Ref Range   Vitamin B-12 323 180 - 914 pg/mL    Comment: (NOTE) This assay is not validated for testing neonatal or myeloproliferative syndrome specimens for Vitamin B12 levels. Performed at [facility], [address] [place], [place] [address]   Folate, serum, performed at [facility] lab     Status: Abnormal   Collection Time: 07/21/21  6:42 PM  Result Value Ref Range   Folate 4.6 (L) >5.9 ng/mL    Comment: Performed at [facility], [address], [place], [place] [address]    Blood Alcohol level:  Lab Results  Component Value Date   [facility] <10 07/13/2021   ETH <10 05/01/2021    Metabolic Disorder Labs: Lab Results  Component Value Date   HGBA1C 5.4 07/20/2021   MPG 108.28 07/20/2021   MPG 128 05/02/2021   No results found for: PROLACTIN Lab Results  Component Value Date   CHOL 224 (H) 07/17/2021   TRIG 364 (H) 07/17/2021   HDL 48 07/17/2021   CHOLHDL 4.7 07/17/2021   VLDL 73 (H) 07/17/2021   LDLCALC 103 (H) 07/17/2021   LDLCALC 58 10/15/2020    Physical Findings: AIMS: 0, no cogwheeling or rigidity, no tremor or bradykinesia (9/1)  Musculoskeletal: Strength & Muscle Tone: within normal limits Gait & Station: normal Patient leans: NA  Psychiatric Specialty Exam:  Presentation  General Appearance: Appropriate for Environment; Casual  Eye Contact:Good  Speech:Clear and Coherent  Speech Volume:Normal  Handedness:Right   Mood and Affect  Mood:"Mellow", appears calm and more euthymic  Affect:constricted, less guarded and no longer irritable  Thought Process  Thought Processes:Coherent; Linear  Descriptions of Associations:Intact  Orientation:Full (Time, Place and Person)  Thought Content: Denies AVH,  paranoia, delusions, ideas of reference, or first rank symptoms - appears less paranoid and no longer guarded on exam; no evidence of acute response to internal/external stimuli on exam  History of Schizophrenia/Schizoaffective disorder:Yes  Duration of Psychotic Symptoms:Greater than six months  Hallucinations:Hallucinations: None  Ideas of Reference:None  Suicidal Thoughts:Suicidal Thoughts: No  Homicidal Thoughts:Homicidal Thoughts: No   Sensorium  Memory:Immediate Fair; Recent Fair; Remote Fair  Judgment:Fair  Insight:Lacking   Executive Functions  Concentration:Fair  Attention Span:Fair  Recall:Fair  Fund of Knowledge:Fair  Language:Fair   Psychomotor Activity  Psychomotor Activity:Normal - no cogwheeling, no stiffness, no tremor - AIMS 0  Assets  Assets:Physical Health   Sleep  Sleep:Sleep: Fair Number of Hours of Sleep: 8.25    Physical Exam: Physical Exam Vitals and nursing note reviewed.  Constitutional:      Appearance: Normal appearance. She is normal weight.  HENT:     Head: Normocephalic and atraumatic.  Pulmonary:     Effort: Pulmonary effort is normal.  Neurological:     General: No focal deficit present.     Mental Status: She is oriented to person, place, and time.   Review of Systems  Respiratory:  Negative for shortness of breath.   Cardiovascular:  Negative for chest pain.  Gastrointestinal:  Negative for abdominal pain, constipation, diarrhea, heartburn, nausea and vomiting.  Blood pressure 116/85, pulse 94, temperature 98.7 F (37.1 C), temperature source Oral, resp. rate 16, height 4\' 11"  (1.499 m), weight 58.1 kg, SpO2 100 %. Body mass index is 25.85 kg/m.   Treatment Plan Summary: Daily contact with patient to assess and evaluate symptoms and progress in treatment and Medication management  Safety and Monitoring -- VOLUNTARY admission to inpatient psychiatric unit for safety, stabilization and treatment -- Daily  contact with patient to assess and evaluate symptoms and progress in treatment -- Patient's case to be discussed in multi-disciplinary team meeting -- Observation Level : q15 minute checks -- Vital signs:  q12 hours -- Precautions: suicide  Psychosis in the context of schizophrenia and medication non-compliance  -  Continue Abilify 15 mg daily (needs 14 day overlap) -Abilify Maintena 400 mg IM (received 8/31 AM) Antipsychotic labs             EKG: Sinus brady, Qtc 401             Lipids: chol 224, TG 364, LDL 103              A1C: 5.4 AIMS: 0, no cogwheeling or rigidity, no tremor or bradykinesia (9/1)  Medical Management Covid negative CMP: K of 3.3, replaced (recheck of 4.4) CBC: hgb of 9.9, recheck stable at 9.8 (pt with previous CBCs in 2021 from 10.3-10.7 and one from 2019 of 10.1) EtOH: <10 UDS: negative Upreg: neg TSH: 3.1 A1C: 5.4   Anemia Hgb of 9.9, recheck stable at 9.8 (pt with previous CBCs in 2021 from 10.3-10.7 and one from 2019 of 10.1), normocytic with MCV of 86.5, increased RDW of 18.1 -Reduced to ferrous sulfate 325mg  qod with bowel regimen for constipation Iron, ferritin, and TIBC WNL B12: 323 Folate: 4.6 (07/21/2021) (>5.9 is nml) -Continue Folic acid supplementation - Will need outpatient f/u for recheck after discharge   Constipation -Continue daily Miralax daily - patient reports she is having regular BMs  Hyperlipidemia/Hypertriglyceridemia -Discussed start of statin with patient or fenofibrate and she refused med -Recommended she have outpatient follow up to manage hyperlipidemia   Continue PRN's: Tylenol, Maalox, Atarax, Milk of Magnesia, Trazodone  07/23/2021, MD 07/23/2021, 4:14 PM

## 2021-07-23 NOTE — Group Note (Signed)
Occupational Therapy Group Note  Group Topic:Coping Skills  Group Date: 07/23/2021 Start Time: 1400 End Time: 1445 Facilitators: Alaisa Moffitt, OT   Group Description: Group encouraged increased engagement and participation through discussion and activity focused on "Coping Ahead." Patients were split up into teams and selected a card from a stack of positive coping strategies. Patients were instructed to act out/charade the coping skill for other peers to guess and receive points for their team. Discussion followed with a focus on identifying additional positive coping strategies and patients shared how they were going to cope ahead over the weekend while continuing hospitalization stay.  Therapeutic Goal(s): Identify positive vs negative coping strategies. Identify coping skills to be used during hospitalization vs coping skills outside of hospital/at home Increase participation in therapeutic group environment and promote engagement in treatment   Participation Level: OT Group cancelled d/t COVID+ case on the unit and current COVID precautions in place. Will continue to monitor status and engage patients in OT group at next scheduled time.    Plan: Continue to engage patient in OT groups 2 - 3x/week.  07/23/2021  Iolanda Folson, OT   

## 2021-07-23 NOTE — Progress Notes (Signed)
   07/23/21 1000  Psych Admission Type (Psych Patients Only)  Admission Status Involuntary  Psychosocial Assessment  Patient Complaints None  Eye Contact Fair  Facial Expression Flat  Affect Irritable  Speech Logical/coherent  Interaction Isolative  Motor Activity Slow  Appearance/Hygiene Disheveled;Bizarre  Behavior Characteristics Cooperative  Mood Pleasant  Thought Process  Coherency Circumstantial;Concrete thinking  Content Blaming others  Delusions Paranoid  Perception Hallucinations  Hallucination Auditory (denies at present, but responding to internal stimuli at med window)  Judgment Impaired  Confusion WDL  Danger to Self  Current suicidal ideation? Denies  Danger to Others  Danger to Others None reported or observed  Dar note: patient presents with blunted affect and paranoid behaviors.  Observed talking to herself and responding to internal stimuli.  Safety checks maintained.  Patient is safe on the unit.

## 2021-07-23 NOTE — [facility] Group Notes (Signed)
Adult Psychoeducational Group Note  Date:  07/23/2021 Time:  9:07 PM  Group Topic/Focus:  Conflict Resolution:   The focus of this group is to discuss the conflict resolution process and how it may be used upon discharge.  Participation Level:  Active  Participation Quality:  Appropriate and Attentive  Affect:  Appropriate  Cognitive:  Appropriate  Insight: Good  Engagement in Group:  Engaged  Modes of Intervention:  Discussion  Additional Comment  Jacalyn Lefevre 07/23/2021, 9:07 PM

## 2021-07-23 NOTE — Group Note (Signed)
Recreation Therapy Group Note   Group Topic:Healthy Decision Making  Group Date: 07/23/2021 Start Time: 1000 End Time:  Facilitators: Bjorn Loser, NT Location: [address] Dayroom   Group Description: Patients were given a scenario that they were going to be stranded on a deserted [place] for several months before being rescued. Writer tasked them with making a list of 15 things they would choose to bring with them for "survival". The list of items was prioritized most important to least. Each patient would come up with their own list, then work together to create a new list of 15 items while in a group of 3-5 peers. LRT discussed each person's list and how it differed from others. The debrief included discussion of priorities, good decisions versus bad decisions, and how it is important to think before acting so we can make the best decision possible. LRT tied the concept of effective communication among group members to patient's support systems outside of the hospital and its benefit post discharge.  Goal Area(s) Addresses:  Patient will effectively work with peer towards shared goal.  Patient will identify factors that guided their decision making.  Patient will pro-socially communicate ideas during group session.  Education: Social Skills, Journalist, newspaper, Communication, Priorities, Support System, Discharge Planning    Affect/Mood: N/A   Participation Level: Pt did not attend group.    Clinical Observations/Individualized Feedback:  Group did not take place as scheduled due to COVID being on the hall.  LRT gave out packets dealing with coping skills.  Packet consisted of identifying coping skills for triggers, coping skills for certain emotions, word search, crossword puzzle.  Plan: Continue to engage patient in RT group sessions 2-3x/week.   Caroll Rancher, LRT/CTRS 07/23/2021 11:24 AM

## 2021-07-23 NOTE — Progress Notes (Signed)
   07/23/21 2100  Psych Admission Type (Psych Patients Only)  Admission Status Involuntary  Psychosocial Assessment  Patient Complaints None  Eye Contact Fair  Facial Expression Flat  Affect Irritable  Speech Logical/coherent  Interaction Isolative  Motor Activity Slow  Appearance/Hygiene Disheveled;Bizarre  Behavior Characteristics Cooperative  Mood Preoccupied  Thought Process  Coherency Circumstantial;Concrete thinking  Content Blaming others  Delusions Paranoid  Perception Hallucinations  Hallucination Auditory (denies at present, but responding to internal stimuli at med window)  Judgment Impaired  Confusion WDL  Danger to Self  Current suicidal ideation? Denies  Danger to Others  Danger to Others None reported or observed

## 2021-07-24 DIAGNOSIS — F209 Schizophrenia, unspecified: Secondary | ICD-10-CM | POA: Diagnosis not present

## 2021-07-24 LAB — POC SARS CORONAVIRUS 2 AG: SARSCOV2ONAVIRUS 2 AG: NEGATIVE

## 2021-07-24 MED ORDER — POLYETHYLENE GLYCOL 3350 17 G PO PACK: 17.0000 g | PACK | Freq: Every day | ORAL | Status: DC | PRN

## 2021-07-24 NOTE — Progress Notes (Addendum)
[facility] MD Progress Note  07/25/2021 6:56 AM Stephanie [name]  MRN:  [id number]  Chief Complaint: Psychosis  Reason for Admission:Stephanie [name] is a 45 y.o. female with a history of schizophrenia and medication noncompliance, who was initially admitted for inpatient psychiatric hospitalization on 07/16/2021 for management of neglect of personal hygiene, property destruction of her apartment, and erratic behaviors prior to admission. The patient is currently on Hospital Day 8.   Chart Review from last 24 hours:  The patient's chart was reviewed and nursing notes were reviewed. The patient's case was discussed in multidisciplinary team meeting. Per nursing,patient attended select groups and had no acute behavioral issues noted on the unit. She had a positive rapid COVID reported by [facility] yesterday at the 16 min mark of testing, but it was reported as negative in the system since it was technically negative at . She was presumptively placed on COVID hall and in quarantine. Confirmation PCR testing was not completed yesterday as ordered. Per [facility] patient was compliant with scheduled medications and did not require PRNs for agitation or anxiety.  Information Obtained Today During Patient Interview: The patient was seen and evaluated on the unit. On assessment today the patient reports that she has a sore throat, runny nose, HA, and watery eyes today. She denies CP, SOB, or GI symptoms and states her diarrhea has resolved with discontinuation of Miralax. She denies AVH, paranoia, first rank symptoms, or ideas of reference. She states she is sleeping more since she feels unwell with COVID symptoms but reports good appetite and po intake. She denies SI or HI. She denies medication side-effects. We discussed that her COVID PCR is pending and IM feels that she should be symptomatically treated at this time. She ws made aware that we will have to talk to her case manager about discharge planning in the context of  presumptive COVID positive status. She agrees to continue to quarantine at this time.  Principal Problem: Schizophrenia (HCC) Diagnosis: Principal Problem:   Schizophrenia (HCC)  Total Time spent with patient:  I personally spent 25 minutes on the unit in direct patient care. The direct patient care time included face-to-face time with the patient, reviewing the patient's chart, communicating with other professionals, and coordinating care. Greater than 50% of this time was spent in counseling or coordinating care with the patient regarding goals of hospitalization, psycho-education, and discharge planning needs.   Past Psychiatric History: schizophrenia, previous admission in November of 2021 for paranoia and AVH. She was discharged on Abilify 20 mg and Risperdal 4 mg. It appears she was not given an LAI at that hospitalization.  Past Medical History:  Past Medical History:  Diagnosis Date   Schizophrenia [facility])     Past Surgical History:  Procedure Laterality Date   INDUCED ABORTION     Family History:  Family History  Problem Relation Age of Onset   Cancer Father    Family Psychiatric  History: Yes, per patient's reports. A maternal cousin with hx of mental illness/suicide attempt. Survived.  Social History:  Social History   Substance and Sexual Activity  Alcohol Use Never     Social History   Substance and Sexual Activity  Drug Use Never    Social History   Socioeconomic History   Marital status: Unknown    Spouse name: Not on file   Number of children: Not on file   Years of education: Not on file   Highest education level: Not on file  Occupational History  Not on file  Tobacco Use   Smoking status: Never    Passive exposure: Never   Smokeless tobacco: Never  Vaping Use   Vaping Use: Former  Substance and Sexual Activity   Alcohol use: Never   Drug use: Never   Sexual activity: Never  Other Topics Concern   Not on file  Social History Narrative   **  Merged History Encounter **       Social Determinants of Health   Financial Resource Strain: Not on file  Food Insecurity: Not on file  Transportation Needs: Not on file  Physical Activity: Not on file  Stress: Not on file  Social Connections: Not on file   Additional Social History:   Single, has no children, unemployed, [place] in [place].    Sleep: 6.75 hours  Appetite: Good  Current Medications: Current Facility-Administered Medications  Medication Dose Route Frequency Provider Last Rate Last Admin   acetaminophen (TYLENOL) tablet 650 mg  650 mg Oral Q6H PRN Novella Oliveolby, Karen R, NP   650 mg at 07/24/21 0806   alum & mag hydroxide-simeth (MAALOX/MYLANTA) 200-200-20 MG/5ML suspension 30 mL  30 mL Oral Q4H PRN Novella Oliveolby, Karen R, NP       ARIPiprazole (ABILIFY) tablet 15 mg  15 mg Oral Daily Mason JimSingleton, Nyesha Cliff E, MD   15 mg at 07/24/21 [id number]   ferrous sulfate tablet 325 mg  325 mg Oral Adalberto IllQODAY Nariah Morgano E, MD   325 mg at 07/24/21 [id number]   folic acid (FOLVITE) tablet 1 mg  1 mg Oral Daily Carlyn ReichertGabrielle, Nick, MD   1 mg at 07/24/21 [id number]   hydrOXYzine (ATARAX/VISTARIL) tablet 25 mg  25 mg Oral TID PRN Novella Oliveolby, Karen R, NP   25 mg at 07/21/21 1647   OLANZapine zydis (ZYPREXA) disintegrating tablet 5 mg  5 mg Oral Q8H PRN Carlyn ReichertGabrielle, Nick, MD   5 mg at 07/21/21 1648   And   LORazepam (ATIVAN) tablet 1 mg  1 mg Oral PRN Carlyn ReichertGabrielle, Nick, MD       And   ziprasidone (GEODON) injection 20 mg  20 mg Intramuscular PRN Carlyn ReichertGabrielle, Nick, MD       magnesium hydroxide (MILK OF MAGNESIA) suspension 30 mL  30 mL Oral Daily PRN Novella Oliveolby, Karen R, NP       menthol-cetylpyridinium (CEPACOL) lozenge 3 mg  1 lozenge Oral Q4H PRN Bobbitt, Shalon E, NP   3 mg at 07/25/21 0528   polyethylene glycol (MIRALAX / GLYCOLAX) packet 17 g  17 g Oral Daily PRN Park PopeJi, Andrew, MD       traZODone (DESYREL) tablet 50 mg  50 mg Oral QHS PRN Novella Oliveolby, Karen R, NP   50 mg at 07/22/21 2033    Lab Results:  Results for orders placed or performed  during the hospital encounter of 07/16/21 (from the past 48 hour(s))  POC SARS Coronavirus 2 Ag     Status: None   Collection Time: 07/24/21  3:19 PM  Result Value Ref Range   SARSCOV2ONAVIRUS 2 AG NEGATIVE NEGATIVE    Comment: (NOTE) SARS-CoV-2 antigen NOT DETECTED.   Negative results are presumptive.  Negative results do not preclude SARS-CoV-2 infection and should not be used as the sole basis for treatment or other patient management decisions, including infection  control decisions, particularly in the presence of clinical signs and  symptoms consistent with COVID-19, or in those who have been in contact with the virus.  Negative results must be combined with clinical observations, patient history,  and epidemiological information. The expected result is Negative.  Fact Sheet for Patients: https://www.jennings-kim.com/  Fact Sheet for Healthcare Providers: https://alexander-rogers.biz/  This test is not yet approved or cleared by the [place] FDA and  has been authorized for detection and/or diagnosis of SARS-CoV-2 by FDA under an Emergency Use Authorization (EUA).  This EUA will remain in effect (meaning this test can be used) for the duration of  the COV ID-19 declaration under Section 564(b)(1) of the Act, 21 U.S.C. section 360bbb-3(b)(1), unless the authorization is terminated or revoked sooner.      Blood Alcohol level:  Lab Results  Component Value Date   ETH <10 07/13/2021   ETH <10 05/01/2021    Metabolic Disorder Labs: Lab Results  Component Value Date   HGBA1C 5.4 07/20/2021   MPG 108.28 07/20/2021   MPG 128 05/02/2021   No results found for: PROLACTIN Lab Results  Component Value Date   CHOL 224 (H) 07/17/2021   TRIG 364 (H) 07/17/2021   HDL 48 07/17/2021   CHOLHDL 4.7 07/17/2021   VLDL 73 (H) 07/17/2021   LDLCALC 103 (H) 07/17/2021   LDLCALC 58 10/15/2020    Physical Findings:  Musculoskeletal: Strength &  Muscle Tone: within normal limits Gait & Station: normal, steady Patient leans: NA  Psychiatric Specialty Exam: Physical Exam Vitals and nursing note reviewed.  Constitutional:      Appearance: Normal appearance.  HENT:     Head: Normocephalic and atraumatic.  Pulmonary:     Effort: Pulmonary effort is normal.  Neurological:     General: No focal deficit present.    Review of Systems  Constitutional:  Negative for fatigue and fever.  HENT:  Positive for congestion, rhinorrhea and sore throat.   Respiratory:  Negative for shortness of breath.   Cardiovascular:  Negative for chest pain.  Gastrointestinal:  Negative for abdominal pain, constipation, diarrhea, nausea and vomiting.  Musculoskeletal:  Negative for arthralgias and myalgias.  Neurological:  Positive for headaches. Negative for dizziness.   Blood pressure 109/77, pulse 99, temperature 98.9 F (37.2 C), temperature source Oral, resp. rate 16, height 4\' 11"  (1.499 m), weight 58.1 kg, SpO2 99 %.Body mass index is 25.85 kg/m.  General Appearance:  casually dressed, adequate hygiene  Eye Contact:  Good  Speech:  Clear and Coherent and Normal Rate  Volume:  Normal  Mood:  Euthymic  Affect:  Constricted  Thought Process:  Goal Directed and Linear  Orientation:  Full (Time, Place, and Person)  Thought Content:  Logical and denies AVH, paranoia, delusions, ideas of reference, or first rank symptoms; is not grossly responding to internal/external stimuli on exam; no longer appears paranoid  Suicidal Thoughts:  No  Homicidal Thoughts:  No  Memory:  Recent;   Good  Judgement:  Fair  Insight:  Fair  Psychomotor Activity:  Normal  Concentration:  Concentration: Good and Attention Span: Good  Recall:  of Knowledge:  Fair  Language:  Good  Akathisia:  Negative  Assets:  Communication Skills Desire for Improvement Resilience Social Support  ADL's:  Independent  Cognition:  WNL  Sleep:  Number of Hours: 6.75    Treatment Plan Summary: Diagnoses / Active Problems: Schizophrenia by hx  PLAN: Safety and Monitoring:  -- Voluntary admission to inpatient psychiatric unit for safety, stabilization and treatment  -- Daily contact with patient to assess and evaluate symptoms and progress in treatment  -- Patient's case to be discussed in multi-disciplinary team meeting  --  Observation Level : q15 minute checks  -- Vital signs:  q12 hours  -- Precautions: suicide, elopement, and assault  2. Psychiatric Diagnoses and Treatment:   Schizophrenia by hx  -Continue Abilify 15 mg daily (needs 14 day overlap while LAI reaches steady state) -Abilify Maintena 400 mg IM (received 8/31 AM) Antipsychotic labs             EKG: Sinus brady, Qtc 401             Lipids: chol 224, TG 364, LDL 103              A1C: 5.4 AIMS: 0, no cogwheeling or rigidity, no tremor or bradykinesia (9/1)  -- Short Term Goals: Ability to verbalize feelings will improve, Ability to demonstrate self-control will improve, and Compliance with prescribed medications will improve  -- Long Term Goals: Improvement in symptoms so as ready for discharge  3. Medical Issues Being Addressed:   Presumptive COVID Positive  - Monitoring clinically at this time and patient presently with runny nose, HA, and ST - no respiratory distress noted, afebrile and Sats 99% RA  - Per IM recommendations no treatment warranted unless she becomes further symptomatic  - Ordered PRN Albuterol MDI and PRN Mucinex DM; started Vit C and Zinc supplements; Tylenol PRN; Cepacol PRN  - Patient quarantine in place and repeat rapid swab and PCR pending  - Encouraging fluid hydration   Anemia -Hgb of 9.9, recheck stable at 9.8 (pt with previous CBCs in 2021 from 10.3-10.7 and one from 2019 of 10.1), normocytic with MCV of 86.5, increased RDW of 18.1, Iron, ferritin, and TIBC WNLB12: 323; Folate: 4.6 (07/21/2021) (>5.9 is nml) -Reduced to ferrous sulfate 325mg  qod with bowel  regimen for constipation -Continue Folic acid supplementation - Will need outpatient f/u for recheck after discharge   Constipation RESOLVED -Miralax changed to PRN daily    Hyperlipidemia/Hypertriglyceridemia -Discussed start of statin with patient or fenofibrate and she refused med -Recommended she have outpatient follow up to manage hyperlipidemia  Hypokalemia RESOLVED - K+ on repeat 4.4 (8/29)  4. Discharge Planning:   -- Social work and case management to assist with discharge planning and identification of hospital follow-up needs prior to discharge  -- Estimated LOS: 2-3 days  -- Discharge Concerns: Need to establish a safety plan; Medication compliance and effectiveness  -- Discharge Goals: Return home with outpatient referrals for mental health follow-up including medication management/psychotherapy  03-25-1988, MD, FAPA 07/25/2021, 6:56 AM

## 2021-07-24 NOTE — [facility] Suicide Risk Assessment (Signed)
[facility] INPATIENT:  Family/Significant Other Suicide Prevention Education  Suicide Prevention Education:  Education Completed; [facility] Case Worker- Jeralyn Bennett [phone]), has been identified by the patient as the family member/significant other with whom the patient will be residing, and identified as the person(s) who will aid the patient in the event of a mental health crisis (suicidal ideations/suicide attempt).  With written consent from the patient, the family member/significant other has been provided the following suicide prevention education, prior to the and/or following the discharge of the patient.  CSW spoke with case manager who stated that this pt has struggled to take care of herself. She states that this is the second property she has destroyed. The first property she was charged for damaging their car and spent time in jail for this. Case Manager states that this pt will most likely be evicted within the next 30 days. States at this time she is able to return.   The suicide prevention education provided includes the following: Suicide risk factors Suicide prevention and interventions National Suicide Hotline telephone number [facility] assessment telephone number [facility] Emergency Assistance 911 [facility] and/or Residential Mobile Crisis Unit telephone number  Request made of family/significant other to: Remove weapons (e.g., guns, rifles, knives), all items previously/currently identified as safety concern.   Remove drugs/medications (over-the-counter, prescriptions, illicit drugs), all items previously/currently identified as a safety concern.  The family member/significant other verbalizes understanding of the suicide prevention education information provided.  The family member/significant other agrees to remove the items of safety concern listed above.  Stephanie [name] A Lear Carstens 07/24/2021, 8:18 AM

## 2021-07-24 NOTE — Progress Notes (Signed)
Recreation Therapy Notes  Date: 9.2.22 Time: 1000 Location: 500 Hall   Group Topic: Goal Setting  Goal Area(s) Addresses:  Patient will participate in discussion of what a goal is. Patient will successfully complete sheet on goa planning.  Behavioral Response: Engaged  Intervention: Worksheet  Activity: LRT completed one on one sessions with patients on the hall.  LRT spoke with pts about what goals are and what the acronym SMART stands for; specific, measurable, attainable, relevant, and time-bound. Pt were given a worksheet to fill out that focused on planning goals within a week, month, year and five years.  Pts were to also address what would prevent them from reaching their goals, what they need to help reach goals and what can they do immediately to work towards goals.  Education: Following directions, Education on Goal Setting  Education Outcome: Acknowledges education  Clinical Observations/Feedback: In discussion with pt, pt defined a goal as something to work towards.  Pt also expressed if goal wasn't met in the time frame set then "extend the time frame".  Pt explained the key components of a goal was to first "identify a problem, set a time and make it something you can do".  Pt identified goals as stop being argumentative in a week; get cavity filled within several months; have a better mental state in a year and be working or retired in five years.  Pt identified the obstacle to reaching these goals as herself; removing self from focus to achieve goals and can start working towards goals by praying.   Victorino Sparrow, LRT/CTRS     Victorino Sparrow A 07/24/2021 11:39 AM

## 2021-07-24 NOTE — [facility] Group Notes (Signed)
[facility] LCSW Group Therapy   07/24/2021 11:23 AM    Type of Therapy and Topic:  Group Therapy:  Strengths Exploration   Participation Level: Active  Description of Group: This group allows individuals to explore their strengths, learn to use strengths in new ways to improve well-being. Strengths-based interventions involve identifying strengths, understanding how they are used, and learning new ways to apply them. Individuals will identify their strengths, and then explore their roles in different areas of life (relationships, professional life, and personal fulfillment). Individuals will think about ways in which they currently use their strengths, along with new ways they could begin using them.    Therapeutic Goals Patient will verbalize two of their strengths Patient will identify how their strengths are currently used Patient will identify two new ways to apply their strengths  Patients will create a plan to apply their strengths in their daily lives     Summary of Patient Progress:  Pt states that she is independent and does not care what others think.       Therapeutic Modalities Cognitive Behavioral Therapy Motivational Interviewing

## 2021-07-24 NOTE — Plan of Care (Signed)
Discussed patient with Dr. Sandria Manly. Stephanie [name] is a 45 yo female with PMH schizophrenia currently at [facility]; she was exposed to a patient recently that also tested positive for COVID-19 and therefore underwent reflexive testing. She has no other co-morbidities and currently is asymptomatic (no reported fevers, chills, cough, SOB, sputum, CP; did have some mild diarrhea). Given clinical context and low risk of progressive disease, does not warrant treatment at this time unless becomes further symptomatic.   Lewie Chamber, MD [facility] 07/24/2021, 4:46 PM

## 2021-07-24 NOTE — Progress Notes (Addendum)
[facility] MD Progress Note  07/24/2021 11:58 AM Stephanie [name]  MRN:  [id number] Subjective:    Stephanie [name] is a 45 year old AA female with hx of Schizophrenia & medication non-compliance. Patient was apparently found destroying her home and kitchen cabinets. Patient had apparently pulled multiple cabinets out of the wall and had pushed her washing machine out her front door.  She had also pulled a receptacle for the dryer out of the wall.  Patient reported that she was "remodeling her apartment".  Her UDS was negative of all substances & BAL was < 10. Reports also indicated that patient was neglecting her personal hygiene.   Chart Review, 24 hr Events: The patient's chart was reviewed and nursing notes were reviewed. The patient's case was discussed in multidisciplinary team meeting.  Per MAR: -Patient is compliant with scheduled meds -PRNs: None Per RN notes, no documented behavioral issues and is attending group Patient slept 8.25 hours  Principal Problem: Schizophrenia (HCC) Diagnosis: Principal Problem:   Schizophrenia (HCC)  Total Time spent with patient:  I personally spent 35 minutes on the unit in direct patient care. The direct patient care time included face-to-face time with the patient, reviewing the patient's chart, communicating with other professionals, and coordinating care. Greater than 50% of this time was spent in counseling or coordinating care with the patient regarding goals of hospitalization, psycho-education, and discharge planning needs.   Info Obtained During Interview (07/23/2021): The patient was seen and evaluated on the unit with attending Dr. Mason Jim.  On assessment today the patient reported that her appetite was "ok", drinking "ok", mood was "ok", but slept poorly. Pt has been sleeping on the bench in her room rather than her bed because "it looks like a crib". Pt did take a nap today though. Pt requesting for new sheets for bed and will try to sleep on bed.    In addition, patient did not take PRN trazodone that she took night before which may contribute to her insomnia. Pt denies SI/HI/AVH, delusions, paranoia, ideas of reference or first rank symptoms. She denies medication side effects but does state she was having diarrhea that has made her uncomfortable and lose appetite. Counseled patient that this was likely due to the scheduled Miralax that was to counter the potential constipation side effect of iron supplements. Patient also noted some minor pelvic pain prior to admission that was addressed with tylenol and is now resolved. No other complaints at this time.   Past Psychiatric History: schizophrenia, previous admission in November of 2021 for paranoia and AVH. She was discharged on Abilify 20 mg and Risperdal 4 mg. It appears she was not given an LAI at that hospitalization.  Past Medical History:  Past Medical History:  Diagnosis Date   Schizophrenia [facility])     Past Surgical History:  Procedure Laterality Date   INDUCED ABORTION     Family History:  Family History  Problem Relation Age of Onset   Cancer Father    Family Psychiatric  History: Yes, per patient's reports. A maternal cousin with hx of mental illness/suicide attempt. Survived. Social History:  Social History   Substance and Sexual Activity  Alcohol Use Never     Social History   Substance and Sexual Activity  Drug Use Never    Social History   Socioeconomic History   Marital status: Unknown    Spouse name: Not on file   Number of children: Not on file   Years of education: Not on  file   Highest education level: Not on file  Occupational History   Not on file  Tobacco Use   Smoking status: Never    Passive exposure: Never   Smokeless tobacco: Never  Vaping Use   Vaping Use: Former  Substance and Sexual Activity   Alcohol use: Never   Drug use: Never   Sexual activity: Never  Other Topics Concern   Not on file  Social History Narrative   ** Merged  History Encounter **       Social Determinants of Health   Financial Resource Strain: Not on file  Food Insecurity: Not on file  Transportation Needs: Not on file  Physical Activity: Not on file  Stress: Not on file  Social Connections: Not on file   Additional Social History:   Single, has no children, unemployed, lives in [place].    Sleep: Poor per patient report but documented sleep was 7.25 hours  Appetite:  Fair  Current Medications: Current Facility-Administered Medications  Medication Dose Route Frequency Provider Last Rate Last Admin   acetaminophen (TYLENOL) tablet 650 mg  650 mg Oral Q6H PRN Novella Olive, NP   650 mg at 07/24/21 0806   alum & mag hydroxide-simeth (MAALOX/MYLANTA) 200-200-20 MG/5ML suspension 30 mL  30 mL Oral Q4H PRN Novella Olive, NP       ARIPiprazole (ABILIFY) tablet 15 mg  15 mg Oral Daily Mason Jim, Dalisha Shively E, MD   15 mg at 07/24/21 1749   ferrous sulfate tablet 325 mg  325 mg Oral Adalberto Ill, Basilio Meadow E, MD   325 mg at 07/24/21 4496   folic acid (FOLVITE) tablet 1 mg  1 mg Oral Daily Carlyn Reichert, MD   1 mg at 07/24/21 7591   hydrOXYzine (ATARAX/VISTARIL) tablet 25 mg  25 mg Oral TID PRN Novella Olive, NP   25 mg at 07/21/21 1647   OLANZapine zydis (ZYPREXA) disintegrating tablet 5 mg  5 mg Oral Q8H PRN Carlyn Reichert, MD   5 mg at 07/21/21 1648   And   LORazepam (ATIVAN) tablet 1 mg  1 mg Oral PRN Carlyn Reichert, MD       And   ziprasidone (GEODON) injection 20 mg  20 mg Intramuscular PRN Carlyn Reichert, MD       magnesium hydroxide (MILK OF MAGNESIA) suspension 30 mL  30 mL Oral Daily PRN Novella Olive, NP       polyethylene glycol (MIRALAX / GLYCOLAX) packet 17 g  17 g Oral Daily PRN Park Pope, MD       traZODone (DESYREL) tablet 50 mg  50 mg Oral QHS PRN Novella Olive, NP   50 mg at 07/22/21 2033    Lab Results: No results found for this or any previous visit (from the past 48 hour(s)).  Blood Alcohol level:  Lab Results   Component Value Date   ETH <10 07/13/2021   ETH <10 05/01/2021    Metabolic Disorder Labs: Lab Results  Component Value Date   HGBA1C 5.4 07/20/2021   MPG 108.28 07/20/2021   MPG 128 05/02/2021   No results found for: PROLACTIN Lab Results  Component Value Date   CHOL 224 (H) 07/17/2021   TRIG 364 (H) 07/17/2021   HDL 48 07/17/2021   CHOLHDL 4.7 07/17/2021   VLDL 73 (H) 07/17/2021   LDLCALC 103 (H) 07/17/2021   LDLCALC 58 10/15/2020    Physical Findings:  Musculoskeletal: Strength & Muscle Tone: within normal limits Gait &  Station: normal Patient leans: NA  Psychiatric Specialty Exam:  Presentation  General Appearance: Appropriate for Environment; Casual  Eye Contact:Good  Speech:Clear and Coherent  Speech Volume:Normal  Handedness:Right   Mood and Affect  Mood:Euthymic (states mood is "ok") appears calm  Affect:Constricted   Thought Process  Thought Processes:Coherent; Linear  Descriptions of Associations:Intact  Orientation:Full (Time, Place and Person)  Thought Content:Denies AVH, paranoia, delusions, ideas of reference, or first rank symptoms - appears less paranoid and no longer guarded on exam; no evidence of acute response to internal/external stimuli on exam  History of Schizophrenia/Schizoaffective disorder:Yes  Duration of Psychotic Symptoms:Greater than six months  Hallucinations:Hallucinations: None  Ideas of Reference:None  Suicidal Thoughts:Suicidal Thoughts: No  Homicidal Thoughts:Homicidal Thoughts: No   Sensorium  Memory:Immediate Fair; Recent Fair; Remote Fair  Judgment:Fair  Insight:Fair   Executive Functions  Concentration:Fair  Attention Span:Fair  Recall:Fair  Fund of Knowledge:Fair  Language:Fair   Psychomotor Activity  Psychomotor Activity:Normal    Assets  Assets:Physical Health; Manufacturing systems engineerCommunication Skills; Housing   Sleep  Sleep:7.25 hours   Physical Exam: Physical Exam Vitals and nursing  note reviewed.  Constitutional:      Appearance: Normal appearance. She is normal weight.  HENT:     Head: Normocephalic and atraumatic.  Pulmonary:     Effort: Pulmonary effort is normal.  Neurological:     General: No focal deficit present.     Mental Status: She is oriented to person, place, and time.   Review of Systems  Respiratory:  Negative for shortness of breath.   Cardiovascular:  Negative for chest pain.  Gastrointestinal:  Positive for diarrhea. Negative for abdominal pain, constipation, heartburn, nausea and vomiting.  Blood pressure 113/70, pulse 89, temperature 98.2 F (36.8 C), temperature source Oral, resp. rate 16, height 4\' 11"  (1.499 m), weight 58.1 kg, SpO2 100 %. Body mass index is 25.85 kg/m.   Treatment Plan Summary: Daily contact with patient to assess and evaluate symptoms and progress in treatment and Medication management  Treatment Plan Summary: Daily contact with patient to assess and evaluate symptoms and progress in treatment and Medication management   Safety and Monitoring -- VOLUNTARY admission to inpatient psychiatric unit for safety, stabilization and treatment -- Daily contact with patient to assess and evaluate symptoms and progress in treatment -- Patient's case to be discussed in multi-disciplinary team meeting -- Observation Level : q15 minute checks -- Vital signs:  q12 hours -- Precautions: suicide   Psychosis in the context of schizophrenia and medication non-compliance  -Continue Abilify 15 mg daily (needs 14 day overlap) -Abilify Maintena 400 mg IM (received 8/31 AM) Antipsychotic labs             EKG: Sinus brady, Qtc 401             Lipids: chol 224, TG 364, LDL 103              A1C: 5.4 AIMS: 0, no cogwheeling or rigidity, no tremor or bradykinesia (9/1)   Medical Management Covid negative CMP: K of 3.3, replaced (recheck of 4.4) CBC: hgb of 9.9, recheck stable at 9.8 (pt with previous CBCs in 2021 from 10.3-10.7 and one  from 2019 of 10.1) EtOH: <10 UDS: negative Upreg: neg TSH: 3.1 A1C: 5.4   Anemia Hgb of 9.9, recheck stable at 9.8 (pt with previous CBCs in 2021 from 10.3-10.7 and one from 2019 of 10.1), normocytic with MCV of 86.5, increased RDW of 18.1 -Reduced to ferrous sulfate 325mg  qod with  bowel regimen for constipation Iron, ferritin, and TIBC WNL B12: 323 Folate: 4.6 (07/21/2021) (>5.9 is nml) -Continue Folic acid supplementation - Will need outpatient f/u for recheck after discharge   Constipation RESOLVED -Miralax changed to PRN daily - patient reports she is having diarrhea now so have switched from scheduled to prn miralax   Hyperlipidemia/Hypertriglyceridemia -Discussed start of statin with patient or fenofibrate and she refused med -Recommended she have outpatient follow up to manage hyperlipidemia   Continue PRN's: Tylenol, Maalox, Atarax, Milk of Magnesia, Trazodone, Miralax  Park Pope, MD 07/24/2021, 11:58 AM

## 2021-07-24 NOTE — Plan of Care (Signed)
Patient has been in her room as  recommended. Presented to the medication room, was pleasant and cooperative. Denied SI/HI. Reported that "I am glad I got my Abilify shot ". Patient was educated about after discharge plan, the importance of having a provider on outpatient basis. Patient verbalized motivation for continuity of care. Has not expressed and concerns so far.

## 2021-07-24 NOTE — Progress Notes (Signed)
Patient has been in room, calm and cooperative. Has not complained of any additional issues. No additional symptoms noted.

## 2021-07-24 NOTE — Progress Notes (Signed)
D: Patient presents with pleasant and appropriate affect at time of assessment. Patient denies SI/HI at this time. Patient also denies AH/VH at this time. Patient contracts for safety.  A: Provided positive reinforcement and encouragement.  R: Patient cooperative and receptive to efforts. Patient remains safe on the unit.   07/24/21 2025  Psych Admission Type (Psych Patients Only)  Admission Status Involuntary  Psychosocial Assessment  Patient Complaints None  Eye Contact Fair  Facial Expression Other (Comment) (appropriate)  Affect Appropriate to circumstance  Speech Logical/coherent  Interaction Minimal  Motor Activity Slow  Appearance/Hygiene Improved  Behavior Characteristics Cooperative;Appropriate to situation  Mood Pleasant  Thought Process  Coherency WDL  Content WDL  Delusions None reported or observed  Perception WDL  Hallucination None reported or observed  Judgment Impaired  Confusion None  Danger to Self  Current suicidal ideation? Denies  Danger to Others  Danger to Others None reported or observed

## 2021-07-24 NOTE — Progress Notes (Signed)
Pt did not attend 1:1 orientation/goals group. 

## 2021-07-24 NOTE — [facility] Group Notes (Signed)
[facility] Group Notes:  (Nursing/MHT/Case Management/Adjunct)  Date:  07/24/2021  Time:  1:54 PM  Type of Therapy:  Nurse Education  Participation Level:  Did Not Attend      Audrie Lia Shamar Engelmann 07/24/2021, 1:54 PM

## 2021-07-25 DIAGNOSIS — F209 Schizophrenia, unspecified: Secondary | ICD-10-CM | POA: Diagnosis not present

## 2021-07-25 LAB — RESP PANEL BY RT-PCR (FLU A&B, COVID) ARPGX2
Influenza A by PCR: NEGATIVE
Influenza B by PCR: NEGATIVE
SARS Coronavirus 2 by RT PCR: POSITIVE — AB

## 2021-07-25 MED ORDER — DM-GUAIFENESIN ER 30-600 MG PO TB12
1.0000 | ORAL_TABLET | Freq: Two times a day (BID) | ORAL | Status: DC | PRN
  Administered 2021-07-26 – 2021-07-28 (×2): 1 via ORAL
  Filled 2021-07-25: qty 1

## 2021-07-25 MED ORDER — ALBUTEROL SULFATE HFA 108 (90 BASE) MCG/ACT IN AERS: 2.0000 | INHALATION_SPRAY | RESPIRATORY_TRACT | Status: DC | PRN

## 2021-07-25 MED ORDER — MENTHOL 3 MG MT LOZG
1.0000 | LOZENGE | OROMUCOSAL | Status: DC | PRN
  Administered 2021-07-25 (×2): 3 mg via ORAL
  Filled 2021-07-25: qty 9

## 2021-07-25 MED ORDER — ASCORBIC ACID 500 MG PO TABS
500.0000 mg | ORAL_TABLET | Freq: Every day | ORAL | Status: DC
  Administered 2021-07-25 – 2021-07-29 (×5): 500 mg via ORAL
  Filled 2021-07-25 (×6): qty 1

## 2021-07-25 MED ORDER — ZINC SULFATE 220 (50 ZN) MG PO CAPS
220.0000 mg | ORAL_CAPSULE | Freq: Every day | ORAL | Status: DC
  Administered 2021-07-25 – 2021-07-29 (×5): 220 mg via ORAL
  Filled 2021-07-25 (×6): qty 1

## 2021-07-25 NOTE — Progress Notes (Signed)
   07/25/21 0602  Vital Signs  Temp 98.9 F (37.2 C)  Temp Source Oral  Pulse Rate 87  Pulse Rate Source Monitor  BP 110/79  BP Method Automatic  Oxygen Therapy  SpO2 99 %   D:  Patient denies SI/HI/AVH. Patient denies both anxiety and depression. Pt. Stated "I just don't feel good" Pt. Has been isolating in her room. PT complained of sore throat pain.  A:  Patient took scheduled medicine. Pt given cepacol for sore throat pain  Support and encouragement provided Routine safety checks conducted every 15 minutes. Patient  Informed to notify staff with any concerns.   R:  Safety maintained.

## 2021-07-25 NOTE — Progress Notes (Signed)
Pt was swabbed, PCR for COVID and swab was sent out.

## 2021-07-25 NOTE — Group Note (Signed)
Clinical Social Work Note  No group could be held today due to a COVID-19 outbreak on the unit.  An educational packet about anger management was provided to the patient to work on individually.  Roselin Wiemann Grossman-Orr, LCSW 07/25/2021, 8:59 AM    

## 2021-07-26 DIAGNOSIS — F209 Schizophrenia, unspecified: Secondary | ICD-10-CM | POA: Diagnosis not present

## 2021-07-26 NOTE — Progress Notes (Addendum)
D. Pt isolative to her room for the majority of the shift. Per pt's self inventory, pt rated her depression, hopelessness and anxiety all 0's today. Pt denies SI/HI and A/VH. Pt reported that her goal was to plan on going home  A. Labs and vitals monitored. Pt given and educated on medications. Pt supported emotionally and encouraged to express concerns and ask questions.   R. Pt remains safe with 15 minute checks. Will continue POC.     07/26/21 1300  Psych Admission Type (Psych Patients Only)  Admission Status Involuntary  Psychosocial Assessment  Patient Complaints None  Eye Contact Fair  Facial Expression Other (Comment) (appropriate)  Affect Appropriate to circumstance  Speech Logical/coherent  Interaction Minimal  Motor Activity Slow  Appearance/Hygiene Improved  Behavior Characteristics Cooperative;Calm  Mood Sad  Thought Process  Coherency WDL  Content WDL  Delusions None reported or observed  Perception WDL  Hallucination None reported or observed  Judgment Impaired  Confusion None  Danger to Self  Current suicidal ideation? Denies  Danger to Others  Danger to Others None reported or observed

## 2021-07-26 NOTE — Progress Notes (Addendum)
[facility] MD Progress Note  07/26/2021 1:12 PM Stephanie [name]  MRN:  [id number]  Chief Complaint: Psychosis  Reason for Admission:Stephanie [name] is a 44 y.o. female with a history of schizophrenia and medication noncompliance, who was initially admitted for inpatient psychiatric hospitalization on 07/16/2021 for management of neglect of personal hygiene, property destruction of her apartment, and erratic behaviors prior to admission. The patient is currently on Hospital Day 9.   Chart Review from last 24 hours:  The patient's chart was reviewed and nursing notes were reviewed. The patient's case was discussed in multidisciplinary team meeting. Per nursing, patient has not required PRNs.  Information Obtained Today During Patient Interview: The patient was seen and evaluated on the unit. On assessment today the patient reports that she has a sore throat, runny nose, cough, and HA today. She denies CP, SOB, or GI symptoms. She denies AVH, paranoia, first rank symptoms, or ideas of reference. She states she is sleeping more since she feels unwell with COVID symptoms but reports good appetite and po intake. She denies SI or HI. She denies medication side-effects. She agrees to continue to quarantine at this time.  Principal Problem: Schizophrenia (HCC) Diagnosis: Principal Problem:   Schizophrenia (HCC)  Total Time spent with patient:  I personally spent 35 minutes on the unit in direct patient care. The direct patient care time included face-to-face time with the patient, reviewing the patient's chart, communicating with other professionals, and coordinating care. Greater than 50% of this time was spent in counseling or coordinating care with the patient regarding goals of hospitalization, psycho-education, and discharge planning needs.   Past Psychiatric History: schizophrenia, previous admission in November of 2021 for paranoia and AVH. She was discharged on Abilify 20 mg and Risperdal 4 mg. It  appears she was not given an LAI at that hospitalization.  Past Medical History:  Past Medical History:  Diagnosis Date   Schizophrenia [facility])     Past Surgical History:  Procedure Laterality Date   INDUCED ABORTION     Family History:  Family History  Problem Relation Age of Onset   Cancer Father    Family Psychiatric  History: Yes, per patient's reports. A maternal cousin with hx of mental illness/suicide attempt. Survived.  Social History:  Social History   Substance and Sexual Activity  Alcohol Use Never     Social History   Substance and Sexual Activity  Drug Use Never    Social History   Socioeconomic History   Marital status: Unknown    Spouse name: Not on file   Number of children: Not on file   Years of education: Not on file   Highest education level: Not on file  Occupational History   Not on file  Tobacco Use   Smoking status: Never    Passive exposure: Never   Smokeless tobacco: Never  Vaping Use   Vaping Use: Former  Substance and Sexual Activity   Alcohol use: Never   Drug use: Never   Sexual activity: Never  Other Topics Concern   Not on file  Social History Narrative   ** Merged History Encounter **       Social Determinants of Health   Financial Resource Strain: Not on file  Food Insecurity: Not on file  Transportation Needs: Not on file  Physical Activity: Not on file  Stress: Not on file  Social Connections: Not on file   Additional Social History:   Single, has no children, unemployed, lives in  [place].    Sleep: 6.75 hours  Appetite: Good  Current Medications: Current Facility-Administered Medications  Medication Dose Route Frequency Provider Last Rate Last Admin   acetaminophen (TYLENOL) tablet 650 mg  650 mg Oral Q6H PRN Novella Olive, NP   650 mg at 07/24/21 0806   albuterol (VENTOLIN HFA) 108 (90 Base) MCG/ACT inhaler 2 puff  2 puff Inhalation Q4H PRN Comer Locket, MD       alum & mag hydroxide-simeth  (MAALOX/MYLANTA) 200-200-20 MG/5ML suspension 30 mL  30 mL Oral Q4H PRN Novella Olive, NP       ARIPiprazole (ABILIFY) tablet 15 mg  15 mg Oral Daily Mason Jim, Amy E, MD   15 mg at 07/26/21 5053   ascorbic acid (VITAMIN C) tablet 500 mg  500 mg Oral Daily Mason Jim, Amy E, MD   500 mg at 07/26/21 0757   dextromethorphan-guaiFENesin (MUCINEX DM) 30-600 MG per 12 hr tablet 1 tablet  1 tablet Oral BID PRN Comer Locket, MD       ferrous sulfate tablet 325 mg  325 mg Oral Adalberto Ill, Amy E, MD   325 mg at 07/26/21 0757   folic acid (FOLVITE) tablet 1 mg  1 mg Oral Daily Carlyn Reichert, MD   1 mg at 07/26/21 0757   hydrOXYzine (ATARAX/VISTARIL) tablet 25 mg  25 mg Oral TID PRN Novella Olive, NP   25 mg at 07/21/21 1647   OLANZapine zydis (ZYPREXA) disintegrating tablet 5 mg  5 mg Oral Q8H PRN Carlyn Reichert, MD   5 mg at 07/21/21 1648   And   LORazepam (ATIVAN) tablet 1 mg  1 mg Oral PRN Carlyn Reichert, MD       And   ziprasidone (GEODON) injection 20 mg  20 mg Intramuscular PRN Carlyn Reichert, MD       magnesium hydroxide (MILK OF MAGNESIA) suspension 30 mL  30 mL Oral Daily PRN Novella Olive, NP       menthol-cetylpyridinium (CEPACOL) lozenge 3 mg  1 lozenge Oral Q4H PRN Bobbitt, Shalon E, NP   3 mg at 07/25/21 0819   polyethylene glycol (MIRALAX / GLYCOLAX) packet 17 g  17 g Oral Daily PRN Park Pope, MD       traZODone (DESYREL) tablet 50 mg  50 mg Oral QHS PRN Novella Olive, NP   50 mg at 07/22/21 2033   zinc sulfate capsule 220 mg  220 mg Oral Daily Comer Locket, MD   220 mg at 07/26/21 9767    Lab Results:  Results for orders placed or performed during the hospital encounter of 07/16/21 (from the past 48 hour(s))  POC SARS Coronavirus 2 Ag     Status: None   Collection Time: 07/24/21  3:19 PM  Result Value Ref Range   SARSCOV2ONAVIRUS 2 AG NEGATIVE NEGATIVE    Comment: (NOTE) SARS-CoV-2 antigen NOT DETECTED.   Negative results are presumptive.  Negative results do  not preclude SARS-CoV-2 infection and should not be used as the sole basis for treatment or other patient management decisions, including infection  control decisions, particularly in the presence of clinical signs and  symptoms consistent with COVID-19, or in those who have been in contact with the virus.  Negative results must be combined with clinical observations, patient history, and epidemiological information. The expected result is Negative.  Fact Sheet for Patients: https://www.jennings-kim.com/  Fact Sheet for Healthcare Providers: https://alexander-rogers.biz/  This test is not yet approved or cleared  by the [place] FDA and  has been authorized for detection and/or diagnosis of SARS-CoV-2 by FDA under an Emergency Use Authorization (EUA).  This EUA will remain in effect (meaning this test can be used) for the duration of  the COV ID-19 declaration under Section 564(b)(1) of the Act, 21 U.S.C. section 360bbb-3(b)(1), unless the authorization is terminated or revoked sooner.    Resp Panel by RT-PCR (Flu A&B, Covid) Nasopharyngeal Swab     Status: Abnormal   Collection Time: 07/25/21 11:42 AM   Specimen: Nasopharyngeal Swab; Nasopharyngeal(NP) swabs in vial transport medium  Result Value Ref Range   SARS Coronavirus 2 by RT PCR POSITIVE (A) NEGATIVE    Comment: RESULT CALLED TO, READ BACK BY AND VERIFIED WITH: SCEARCE,M RN @1300  ON 07/25/21 kds (NOTE) SARS-CoV-2 target nucleic acids are DETECTED.  The SARS-CoV-2 RNA is generally detectable in upper respiratory specimens during the acute phase of infection. Positive results are indicative of the presence of the identified virus, but do not rule out bacterial infection or co-infection with other pathogens not detected by the test. Clinical correlation with patient history and other diagnostic information is necessary to determine patient infection status. The expected result is  Negative.  Fact Sheet for Patients: BloggerCourse.comhttps://www.fda.gov/media/152166/download  Fact Sheet for Healthcare Providers: SeriousBroker.ithttps://www.fda.gov/media/152162/download  This test is not yet approved or cleared by the [place] FDA and  has been authorized for detection and/or diagnosis of SARS-CoV-2 by FDA under an Emergency Use Authorization (EUA).  This EUA will remain in effect (meaning this test can be  used) for the duration of  the COVID-19 declaration under Section 564(b)(1) of the Act, 21 U.S.C. section 360bbb-3(b)(1), unless the authorization is terminated or revoked sooner.     Influenza A by PCR NEGATIVE NEGATIVE   Influenza B by PCR NEGATIVE NEGATIVE    Comment: (NOTE) The Xpert Xpress SARS-CoV-2/FLU/RSV plus assay is intended as an aid in the diagnosis of influenza from Nasopharyngeal swab specimens and should not be used as a sole basis for treatment. Nasal washings and aspirates are unacceptable for Xpert Xpress SARS-CoV-2/FLU/RSV testing.  Fact Sheet for Patients: BloggerCourse.comhttps://www.fda.gov/media/152166/download  Fact Sheet for Healthcare Providers: SeriousBroker.ithttps://www.fda.gov/media/152162/download  This test is not yet approved or cleared by the [place] FDA and has been authorized for detection and/or diagnosis of SARS-CoV-2 by FDA under an Emergency Use Authorization (EUA). This EUA will remain in effect (meaning this test can be used) for the duration of the COVID-19 declaration under Section 564(b)(1) of the Act, 21 U.S.C. section 360bbb-3(b)(1), unless the authorization is terminated or revoked.  Performed at [facility], [address] [place], [place] [id number]     Blood Alcohol level:  Lab Results  Component Value Date   [facility] <10 07/13/2021   ETH <10 05/01/2021    Metabolic Disorder Labs: Lab Results  Component Value Date   HGBA1C 5.4 07/20/2021   MPG 108.28 07/20/2021   MPG 128 05/02/2021   No results found for: PROLACTIN Lab  Results  Component Value Date   CHOL 224 (H) 07/17/2021   TRIG 364 (H) 07/17/2021   HDL 48 07/17/2021   CHOLHDL 4.7 07/17/2021   VLDL 73 (H) 07/17/2021   LDLCALC 103 (H) 07/17/2021   LDLCALC 58 10/15/2020    Physical Findings:  Musculoskeletal: Strength & Muscle Tone: within normal limits Gait & Station: normal, steady Patient leans: NA  Psychiatric Specialty Exam: Physical Exam Vitals and nursing note reviewed.  Constitutional:      Appearance:  Normal appearance.  HENT:     Head: Normocephalic and atraumatic.  Cardiovascular:     Rate and Rhythm: Normal rate and regular rhythm.     Pulses: Normal pulses.     Heart sounds: Normal heart sounds.  Pulmonary:     Effort: Pulmonary effort is normal. No respiratory distress.     Breath sounds: Normal breath sounds. No wheezing.  Chest:     Chest wall: No tenderness.  Neurological:     General: No focal deficit present.    Review of Systems  Constitutional:  Negative for fatigue and fever.  HENT:  Positive for congestion, rhinorrhea and sore throat.   Respiratory:  Negative for shortness of breath.   Cardiovascular:  Negative for chest pain.  Gastrointestinal:  Negative for abdominal pain, constipation, diarrhea, nausea and vomiting.  Musculoskeletal:  Negative for arthralgias and myalgias.  Neurological:  Positive for headaches. Negative for dizziness.   Blood pressure 107/78, pulse 92, temperature 99.7 F (37.6 C), temperature source Oral, resp. rate 16, height  (1.499 m), weight 58.1 kg, SpO2 100 %.Body mass index is 25.85 kg/m.  General Appearance:  casually dressed, adequate hygiene  Eye Contact:  Good  Speech:  Clear and Coherent and Normal Rate  Volume:  Normal  Mood:  Euthymic  Affect:  Constricted  Thought Process:  Goal Directed and Linear  Orientation:  Full (Time, Place, and Person)  Thought Content:  Logical and denies AVH, paranoia, delusions, ideas of reference, or first rank symptoms; is not  grossly responding to internal/external stimuli on exam; no longer appears paranoid  Suicidal Thoughts:  No  Homicidal Thoughts:  No  Memory:  Recent;   Good  Judgement:  Fair  Insight:  Fair  Psychomotor Activity:  Normal  Concentration:  Concentration: Good and Attention Span: Good  Recall:  [employer] of Knowledge:  Fair  Language:  Good  Akathisia:  Negative  Assets:  Communication Skills Desire for Improvement Resilience Social Support  ADL's:  Independent  Cognition:  WNL  Sleep:  Number of Hours: 6   Treatment Plan Summary: Diagnoses / Active Problems: Schizophrenia by hx  PLAN: Safety and Monitoring:  -- Voluntary admission to inpatient psychiatric unit for safety, stabilization and treatment  -- Daily contact with patient to assess and evaluate symptoms and progress in treatment  -- Patient's case to be discussed in multi-disciplinary team meeting  -- Observation Level : q15 minute checks  -- Vital signs:  q12 hours  -- Precautions: suicide, elopement, and assault  2. Psychiatric Diagnoses and Treatment:   Schizophrenia by hx  -Continue Abilify 15 mg daily (needs 14 day overlap while LAI reaches steady state, day 4) -Abilify Maintena 400 mg IM (received 8/31 AM) Antipsychotic labs             EKG: Sinus brady, Qtc 401             Lipids: chol 224, TG 364, LDL 103              A1C: 5.4 AIMS: 0, no cogwheeling or rigidity, no tremor or bradykinesia (9/1)  -- Short Term Goals: Ability to verbalize feelings will improve, Ability to demonstrate self-control will improve, and Compliance with prescribed medications will improve  -- Long Term Goals: Improvement in symptoms so as ready for discharge  3. Medical Issues Being Addressed:   COVID Positive  - RT-PCR 07/25/21 COVID positive - Monitoring clinically at this time and patient presently with runny nose, HA,  and ST - no respiratory distress noted, afebrile and Sats 99% RA  - Per IM recommendations no treatment  warranted unless she becomes further symptomatic  - Ordered PRN Albuterol MDI and PRN Mucinex DM; started Vit C and Zinc supplements; Tylenol PRN; Cepacol PRN  - Encouraging fluid hydration   Anemia -Hgb of 9.9, recheck stable at 9.8 (pt with previous CBCs in 2021 from 10.3-10.7 and one from 2019 of 10.1), normocytic with MCV of 86.5, increased RDW of 18.1, Iron, ferritin, and TIBC WNLB12: 323; Folate: 4.6 (07/21/2021) (>5.9 is nml) -Reduced to ferrous sulfate 325mg  qod with bowel regimen for constipation -Continue Folic acid supplementation - Will need outpatient f/u for recheck after discharge   Constipation RESOLVED -Miralax changed to PRN daily    Hyperlipidemia/Hypertriglyceridemia -Discussed start of statin with patient or fenofibrate and she refused med -Recommended she have outpatient follow up to manage hyperlipidemia  Hypokalemia RESOLVED - K+ on repeat 4.4 (8/29)  4. Discharge Planning:   -- Social work and case management to assist with discharge planning and identification of hospital follow-up needs prior to discharge  -- Estimated LOS: 2-3 days  -- Discharge Concerns: Need to establish a safety plan; Medication compliance and effectiveness  -- Discharge Goals: Return home with outpatient referrals for mental health follow-up including medication management/psychotherapy  03-25-1988, MD 07/26/2021, 1:12 PM

## 2021-07-26 NOTE — Progress Notes (Signed)
Pt received worksheets for 10 am psycho-educational group. 

## 2021-07-26 NOTE — Progress Notes (Signed)
Pt did not attend 1:1 Orientation/Goals group. 

## 2021-07-26 NOTE — Group Note (Signed)
Clinical Social Work Note  Group was not held in person due to COVID-19 outbreak and isolation requirements on the unit. CSW provided handouts on building healthy supports to work on individually in rooms.  Gerron Guidotti Grossman-Orr, LCSW 07/26/2021, 12:19 PM    

## 2021-07-27 ENCOUNTER — Encounter (HOSPITAL_COMMUNITY): Payer: Self-pay

## 2021-07-27 DIAGNOSIS — F209 Schizophrenia, unspecified: Secondary | ICD-10-CM | POA: Diagnosis not present

## 2021-07-27 MED ORDER — ARIPIPRAZOLE 10 MG PO TABS
20.0000 mg | ORAL_TABLET | Freq: Every day | ORAL | Status: DC
  Administered 2021-07-28 – 2021-07-29 (×2): 20 mg via ORAL
  Filled 2021-07-27 (×3): qty 2

## 2021-07-27 MED ORDER — ARIPIPRAZOLE 5 MG PO TABS
5.0000 mg | ORAL_TABLET | Freq: Once | ORAL | Status: AC
  Administered 2021-07-27: 5 mg via ORAL
  Filled 2021-07-27 (×2): qty 1

## 2021-07-27 NOTE — Progress Notes (Signed)
Pt. Approached MHT and said "Let me call my case worker," Pt. Stated that she thought that the staff was laughing at her. At no time did the staff laugh at pt.

## 2021-07-27 NOTE — Progress Notes (Signed)
Pt did not attend 1:1 Orientation/Goals group. 

## 2021-07-27 NOTE — Progress Notes (Signed)
   07/27/21 1740  Vital Signs  Temp 98.8 F (37.1 C)  Temp Source Oral  Pulse Rate 83  Pulse Rate Source Monitor  BP 105/68  BP Location Right Arm  BP Method Automatic  Patient Position (if appropriate) Sitting  Oxygen Therapy  SpO2 100 %   D: Patient denies SI/HI/AVH. Pt.denied both anxiety and depression. While up at the med window pt. Stated "this is the last time I will get meds today!" Pt. Was observed by MHT to slam the door to her room and was yelling to someone that was not there. Nurse asked if she felt ok and pt. Stated "I'm just responding to the voices."Pt. Isolated in her room.  A:  Patient took scheduled medicine.  Support and encouragement provided Routine safety checks conducted every 15 minutes. Patient  Informed to notify staff with any concerns.   R: Safety maintained.

## 2021-07-27 NOTE — BH IP Treatment Plan (Signed)
Interdisciplinary Treatment and Diagnostic Plan Update  07/27/2021 Time of Session: 11:35am Stephanie [name] MRN: [id number]  Principal Diagnosis: Schizophrenia [facility])  Secondary Diagnoses: Principal Problem:   Schizophrenia (HCC)   Current Medications:  Current Facility-Administered Medications  Medication Dose Route Frequency Provider Last Rate Last Admin   acetaminophen (TYLENOL) tablet 650 mg  650 mg Oral Q6H PRN Novella Olive, NP   650 mg at 07/24/21 0806   albuterol (VENTOLIN HFA) 108 (90 Base) MCG/ACT inhaler 2 puff  2 puff Inhalation Q4H PRN Comer Locket, MD       alum & mag hydroxide-simeth (MAALOX/MYLANTA) 200-200-20 MG/5ML suspension 30 mL  30 mL Oral Q4H PRN Novella Olive, NP       ARIPiprazole (ABILIFY) tablet 15 mg  15 mg Oral Daily Mason Jim, Amy E, MD   15 mg at 07/27/21 8416   ascorbic acid (VITAMIN C) tablet 500 mg  500 mg Oral Daily Bartholomew Crews E, MD   500 mg at 07/27/21 0814   dextromethorphan-guaiFENesin (MUCINEX DM) 30-600 MG per 12 hr tablet 1 tablet  1 tablet Oral BID PRN Comer Locket, MD   1 tablet at 07/26/21 1730   ferrous sulfate tablet 325 mg  325 mg Oral Adalberto Ill, Amy E, MD   325 mg at 07/26/21 0757   folic acid (FOLVITE) tablet 1 mg  1 mg Oral Daily Carlyn Reichert, MD   1 mg at 07/27/21 6063   hydrOXYzine (ATARAX/VISTARIL) tablet 25 mg  25 mg Oral TID PRN Novella Olive, NP   25 mg at 07/21/21 1647   OLANZapine zydis (ZYPREXA) disintegrating tablet 5 mg  5 mg Oral Q8H PRN Carlyn Reichert, MD   5 mg at 07/27/21 0160   And   LORazepam (ATIVAN) tablet 1 mg  1 mg Oral PRN Carlyn Reichert, MD       And   ziprasidone (GEODON) injection 20 mg  20 mg Intramuscular PRN Carlyn Reichert, MD       magnesium hydroxide (MILK OF MAGNESIA) suspension 30 mL  30 mL Oral Daily PRN Novella Olive, NP       menthol-cetylpyridinium (CEPACOL) lozenge 3 mg  1 lozenge Oral Q4H PRN Bobbitt, Shalon E, NP   3 mg at 07/25/21 0819   polyethylene glycol (MIRALAX /  GLYCOLAX) packet 17 g  17 g Oral Daily PRN Park Pope, MD       traZODone (DESYREL) tablet 50 mg  50 mg Oral QHS PRN Novella Olive, NP   50 mg at 07/22/21 2033   zinc sulfate capsule 220 mg  220 mg Oral Daily Comer Locket, MD   220 mg at 07/27/21 1093   PTA Medications: Medications Prior to Admission  Medication Sig Dispense Refill Last Dose   acetaminophen (TYLENOL) 325 MG tablet Take 2 tablets (650 mg total) by mouth every 6 (six) hours as needed for mild pain (or Fever >/= 101). (Patient not taking: No sig reported)       Patient Stressors: Financial difficulties Medication change or noncompliance  Patient Strengths: Ability for insight Communication skills Physical Health  Treatment Modalities: Medication Management, Group therapy, Case management,  1 to 1 session with clinician, Psychoeducation, Recreational therapy.   Physician Treatment Plan for Primary Diagnosis: Schizophrenia (HCC) Long Term Goal(s): Improvement in symptoms so as ready for discharge   Short Term Goals: Ability to verbalize feelings will improve Ability to demonstrate self-control will improve Compliance with prescribed medications will improve  Medication Management:  Evaluate patient's response, side effects, and tolerance of medication regimen.  Therapeutic Interventions: 1 to 1 sessions, Unit Group sessions and Medication administration.  Evaluation of Outcomes: Progressing  Physician Treatment Plan for Secondary Diagnosis: Principal Problem:   Schizophrenia (HCC)  Long Term Goal(s): Improvement in symptoms so as ready for discharge   Short Term Goals: Ability to verbalize feelings will improve Ability to demonstrate self-control will improve Compliance with prescribed medications will improve     Medication Management: Evaluate patient's response, side effects, and tolerance of medication regimen.  Therapeutic Interventions: 1 to 1 sessions, Unit Group sessions and Medication  administration.  Evaluation of Outcomes: Progressing   RN Treatment Plan for Primary Diagnosis: Schizophrenia (HCC) Long Term Goal(s): Knowledge of disease and therapeutic regimen to maintain health will improve  Short Term Goals: Ability to verbalize frustration and anger appropriately will improve  Medication Management: RN will administer medications as ordered by provider, will assess and evaluate patient's response and provide education to patient for prescribed medication. RN will report any adverse and/or side effects to prescribing provider.  Therapeutic Interventions: 1 on 1 counseling sessions, Psychoeducation, Medication administration, Evaluate responses to treatment, Monitor vital signs and CBGs as ordered, Perform/monitor CIWA, COWS, AIMS and Fall Risk screenings as ordered, Perform wound care treatments as ordered.  Evaluation of Outcomes: Progressing   LCSW Treatment Plan for Primary Diagnosis: Schizophrenia (HCC) Long Term Goal(s): Safe transition to appropriate next level of care at discharge, Engage patient in therapeutic group addressing interpersonal concerns.  Short Term Goals: Engage patient in aftercare planning with referrals and resources, Increase social support, and Increase ability to appropriately verbalize feelings  Therapeutic Interventions: Assess for all discharge needs, 1 to 1 time with Social worker, Explore available resources and support systems, Assess for adequacy in community support network, Educate family and significant other(s) on suicide prevention, Complete Psychosocial Assessment, Interpersonal group therapy.  Evaluation of Outcomes: Progressing   Progress in Treatment: Attending groups: Yes. Participating in groups: Yes. Taking medication as prescribed: Yes. Toleration medication: Yes. Family/Significant other contact made: Yes, individual(s) contacted:  case manager Patient understands diagnosis: Yes. Discussing patient identified  problems/goals with staff: Yes. Medical problems stabilized or resolved: Yes. Denies suicidal/homicidal ideation: Yes. Issues/concerns per patient self-inventory: No.   New problem(s) identified: No, Describe:  none  New Short Term/Long Term Goal(s): detox, medication management for mood stabilization; elimination of SI thoughts; development of comprehensive mental wellness/sobriety plan  Patient Goals:  "get to the bathroom before it is too late."  Discharge Plan or Barriers: Pt is to return home and is to follow up with [facility] for therapy and medication management  Reason for Continuation of Hospitalization: Aggression Medication stabilization  Estimated Length of Stay: 1-3 days   Scribe for Treatment Team: Otelia Santee, LCSW 07/27/2021 12:38 PM

## 2021-07-27 NOTE — Progress Notes (Addendum)
[facility] MD Progress Note  I have independently evaluated the patient during a face-to-face assessment on 07/27/21. I reviewed the patient's chart, and I participated in key portions of the service. I discussed the case with the [employer], and I agree with the assessment and plan of care as documented in the House Officer's note.   On my assessment, patient states she believes she heard staff calling her names this morning which prompted her reported outburst of slamming her door. It appears she required a PRN this morning at 0511 of zyprexa zydis but she is not sure what behaviors prompted her to require a dose and nursing notes do not reflect behavioral issues overnight. She denies current AH, VH, ideas of reference, or first rank symptoms. She was agreeable to titrate up on oral bridging dose of Abilify to 20mg  while her LAI reaches steady state. She will need to remain under COVID quarantine through Tuesday and we will work with SW to see if her case manager can assist with discharge Wednesday if she remains stable.   Wednesday, MD, FAPA   07/27/2021 9:45 AM 09/26/2021  MRN:  [id number] [id number]  Chief Complaint: Psychosis  Reason for Admission:Stephanie [name] is a 45 y.o. female with a history of schizophrenia and medication noncompliance, who was initially admitted for inpatient psychiatric hospitalization on 07/16/2021 for management of neglect of personal hygiene, property destruction of her apartment, and erratic behaviors prior to admission. The patient is currently on Hospital Day 10.   Chart Review from last 24 hours:  The patient's chart was reviewed and nursing notes were reviewed. The patient's case was discussed in multidisciplinary team meeting. Per nursing, patient has required Zyprexa Zydis 5 mg at 5:11 AM due to agitation. Patient also was reported to have been responding to internal stimuli "responding to the voices". Patient did take her scheduled medications appropriately this  morning.  Information Obtained Today During Patient Interview: The patient was seen and evaluated on the unit. Regarding morning incident, patient states that nurse called pt a "nasty ass" and patient was "simply responding to the voices". Pt believes that these voices came from the nurse that dispensed patient meds. Patient states that she was called the same thing by multiple people "on [address]" (uncertain if these are real people or internal stimuli). Patient has not endorsed AH for multiple days since receiving Abilify LAI and admits she is not sure if what she heard this morning was AH or real when questioned. Pt denies VH. She denies ideas of reference, first rank symptoms, or current paranoia.Patient states she has had no other complaints at this time and does not appear to have concern regarding possibility she will be evicted from apartment she is residing in. She states she plans to go back to her apartment after discharge and will work with her case manager about other housing options or will return to a shelter in the future if needed. On assessment today the patient reports that she has a sore throat, stuffed nose today. She denies CP, SOB, or GI symptoms. She states she is sleeping more since she feels unwell with COVID symptoms but reports good appetite and good po intake. She denies SI or HI. She denies medication side-effects. She agrees to continue to quarantine at this time. Pt was pleasant and cooperative status post zydis she received this am.   Principal Problem: Schizophrenia (HCC) Diagnosis: Principal Problem:   Schizophrenia (HCC)  Total Time spent with patient:  I personally  spent 30 minutes on the unit in direct patient care. The direct patient care time included face-to-face time with the patient, reviewing the patient's chart, communicating with other professionals, and coordinating care. Greater than 50% of this time was spent in counseling or coordinating care with  the patient regarding goals of hospitalization, psycho-education, and discharge planning needs.   Past Psychiatric History: schizophrenia, previous admission in November of 2021 for paranoia and AVH. She was discharged on Abilify 20 mg and Risperdal 4 mg. It appears she was not given an LAI at that hospitalization.  Past Medical History:  Past Medical History:  Diagnosis Date   Schizophrenia [facility])     Past Surgical History:  Procedure Laterality Date   INDUCED ABORTION     Family History:  Family History  Problem Relation Age of Onset   Cancer Father    Family Psychiatric  History: Yes, per patient's reports. A maternal cousin with hx of mental illness/suicide attempt. Survived.  Social History:  Social History   Substance and Sexual Activity  Alcohol Use Never     Social History   Substance and Sexual Activity  Drug Use Never    Social History   Socioeconomic History   Marital status: Unknown    Spouse name: Not on file   Number of children: Not on file   Years of education: Not on file   Highest education level: Not on file  Occupational History   Not on file  Tobacco Use   Smoking status: Never    Passive exposure: Never   Smokeless tobacco: Never  Vaping Use   Vaping Use: Former  Substance and Sexual Activity   Alcohol use: Never   Drug use: Never   Sexual activity: Never  Other Topics Concern   Not on file  Social History Narrative   ** Merged History Encounter **       Social Determinants of Health   Financial Resource Strain: Not on file  Food Insecurity: Not on file  Transportation Needs: Not on file  Physical Activity: Not on file  Stress: Not on file  Social Connections: Not on file   Additional Social History:   Single, has no children, unemployed[place]    Sleep: 9.75 hours  Appetite: Good  Current Medications: Current Facility-Administered Medications  Medication Dose Route Frequency Provider Last Rate Last Admin    acetaminophen (TYLENOL) tablet 650 mg  650 mg Oral Q6H PRN Novella Olive, NP   650 mg at 07/24/21 0806   albuterol (VENTOLIN HFA) 108 (90 Base) MCG/ACT inhaler 2 puff  2 puff Inhalation Q4H PRN Comer Locket, MD       alum & mag hydroxide-simeth (MAALOX/MYLANTA) 200-200-20 MG/5ML suspension 30 mL  30 mL Oral Q4H PRN Novella Olive, NP       ARIPiprazole (ABILIFY) tablet 15 mg  15 mg Oral Daily Mason Jim, Sukanya Goldblatt E, MD   15 mg at 07/27/21 0981   ascorbic acid (VITAMIN C) tablet 500 mg  500 mg Oral Daily Mason Jim, Zvi Duplantis E, MD   500 mg at 07/27/21 0814   dextromethorphan-guaiFENesin (MUCINEX DM) 30-600 MG per 12 hr tablet 1 tablet  1 tablet Oral BID PRN Comer Locket, MD   1 tablet at 07/26/21 1730   ferrous sulfate tablet 325 mg  325 mg Oral Adalberto Ill, Wava Kildow E, MD   325 mg at 07/26/21 0757   folic acid (FOLVITE) tablet 1 mg  1 mg Oral Daily Carlyn Reichert, MD  1 mg at 07/27/21 0814   hydrOXYzine (ATARAX/VISTARIL) tablet 25 mg  25 mg Oral TID PRN Novella Oliveolby, Karen R, NP   25 mg at 07/21/21 1647   OLANZapine zydis (ZYPREXA) disintegrating tablet 5 mg  5 mg Oral Q8H PRN Carlyn ReichertGabrielle, Nick, MD   5 mg at 07/27/21 [id number]   And   LORazepam (ATIVAN) tablet 1 mg  1 mg Oral PRN Carlyn ReichertGabrielle, Nick, MD       And   ziprasidone (GEODON) injection 20 mg  20 mg Intramuscular PRN Carlyn ReichertGabrielle, Nick, MD       magnesium hydroxide (MILK OF MAGNESIA) suspension 30 mL  30 mL Oral Daily PRN Novella Oliveolby, Karen R, NP       menthol-cetylpyridinium (CEPACOL) lozenge 3 mg  1 lozenge Oral Q4H PRN Bobbitt, Shalon E, NP   3 mg at 07/25/21 0819   polyethylene glycol (MIRALAX / GLYCOLAX) packet 17 g  17 g Oral Daily PRN Park PopeJi, Andrew, MD       traZODone (DESYREL) tablet 50 mg  50 mg Oral QHS PRN Novella Oliveolby, Karen R, NP   50 mg at 07/22/21 2033   zinc sulfate capsule 220 mg  220 mg Oral Daily Comer LocketSingleton, Maley Venezia E, MD   220 mg at 07/27/21 [id number]    Lab Results:  Results for orders placed or performed during the hospital encounter of 07/16/21 (from the  past 48 hour(s))  Resp Panel by RT-PCR (Flu A&B, Covid) Nasopharyngeal Swab     Status: Abnormal   Collection Time: 07/25/21 11:42 AM   Specimen: Nasopharyngeal Swab; Nasopharyngeal(NP) swabs in vial transport medium  Result Value Ref Range   SARS Coronavirus 2 by RT PCR POSITIVE (A) NEGATIVE    Comment: RESULT CALLED TO, READ BACK BY AND VERIFIED WITH: SCEARCE,M RN @1300  ON 07/25/21 kds (NOTE) SARS-CoV-2 target nucleic acids are DETECTED.  The SARS-CoV-2 RNA is generally detectable in upper respiratory specimens during the acute phase of infection. Positive results are indicative of the presence of the identified virus, but do not rule out bacterial infection or co-infection with other pathogens not detected by the test. Clinical correlation with patient history and other diagnostic information is necessary to determine patient infection status. The expected result is Negative.  Fact Sheet for Patients: BloggerCourse.comhttps://www.fda.gov/media/152166/download  Fact Sheet for Healthcare Providers: SeriousBroker.ithttps://www.fda.gov/media/152162/download  This test is not yet approved or cleared by the [place] FDA and  has been authorized for detection and/or diagnosis of SARS-CoV-2 by FDA under an Emergency Use Authorization (EUA).  This EUA will remain in effect (meaning this test can be  used) for the duration of  the COVID-19 declaration under Section 564(b)(1) of the Act, 21 U.S.C. section 360bbb-3(b)(1), unless the authorization is terminated or revoked sooner.     Influenza A by PCR NEGATIVE NEGATIVE   Influenza B by PCR NEGATIVE NEGATIVE    Comment: (NOTE) The Xpert Xpress SARS-CoV-2/FLU/RSV plus assay is intended as an aid in the diagnosis of influenza from Nasopharyngeal swab specimens and should not be used as a sole basis for treatment. Nasal washings and aspirates are unacceptable for Xpert Xpress SARS-CoV-2/FLU/RSV testing.  Fact Sheet for  Patients: BloggerCourse.comhttps://www.fda.gov/media/152166/download  Fact Sheet for Healthcare Providers: SeriousBroker.ithttps://www.fda.gov/media/152162/download  This test is not yet approved or cleared by the [place] FDA and has been authorized for detection and/or diagnosis of SARS-CoV-2 by FDA under an Emergency Use Authorization (EUA). This EUA will remain in effect (meaning this test can be used) for the duration of the COVID-19 declaration under Section  564(b)(1) of the Act, 21 U.S.C. section 360bbb-3(b)(1), unless the authorization is terminated or revoked.  Performed at [facility], [address] [place], [place] [address]     Blood Alcohol level:  Lab Results  Component Value Date   [facility] <10 07/13/2021   ETH <10 05/01/2021    Metabolic Disorder Labs: Lab Results  Component Value Date   HGBA1C 5.4 07/20/2021   MPG 108.28 07/20/2021   MPG 128 05/02/2021   No results found for: PROLACTIN Lab Results  Component Value Date   CHOL 224 (H) 07/17/2021   TRIG 364 (H) 07/17/2021   HDL 48 07/17/2021   CHOLHDL 4.7 07/17/2021   VLDL 73 (H) 07/17/2021   LDLCALC 103 (H) 07/17/2021   LDLCALC 58 10/15/2020    Physical Findings:  Musculoskeletal: Strength & Muscle Tone: within normal limits Gait & Station: normal, steady Patient leans: NA  Psychiatric Specialty Exam: Physical Exam Vitals and nursing note reviewed.  Constitutional:      Appearance: Normal appearance.  HENT:     Head: Normocephalic and atraumatic.  Cardiovascular:     Rate and Rhythm: Normal rate and regular rhythm.     Pulses: Normal pulses.     Heart sounds: Normal heart sounds.  Pulmonary:     Effort: Pulmonary effort is normal. No respiratory distress.     Breath sounds: Normal breath sounds. No wheezing.  Chest:     Chest wall: No tenderness.  Neurological:     General: No focal deficit present.    Review of Systems  Constitutional:  Negative for fatigue and fever.  HENT:  Positive for  congestion, rhinorrhea and sore throat.   Respiratory:  Negative for shortness of breath.   Cardiovascular:  Negative for chest pain.  Gastrointestinal:  Negative for abdominal pain, constipation, diarrhea, nausea and vomiting.  Musculoskeletal:  Negative for arthralgias and myalgias.  Neurological:  Positive for headaches. Negative for dizziness.   Blood pressure 109/82, pulse 74, temperature 98.8 F (37.1 C), temperature source Oral, resp. rate 16, height 4\' 11"  (1.499 m), weight 58.1 kg, SpO2 100 %.Body mass index is 25.85 kg/m.  General Appearance:  casually dressed, adequate hygiene  Eye Contact:  Good  Speech:  Clear and Coherent and Normal Rate  Volume:  Normal  Mood:  ambivalent, aloof  Affect:  Constricted  Thought Process:  Goal Directed and Linear  Orientation:  Full (Time, Place, and Person)  Thought Content: Pt denies VH, ideas of reference or first rank symptoms; not grossly responding to internal/external stimuli on exam with physicians but reportedly was responding to AH earlier this morning- endorses AH calling her a "nasty ass". Appears guarded.  Suicidal Thoughts:  No  Homicidal Thoughts:  No  Memory:  Recent;   Good  Judgement:  Fair - taking medications  Insight:  Poor  Psychomotor Activity:  Normal - no cogwheeling, no stiffness, no tremor, AIMS 0  Concentration:  Concentration: Good and Attention Span: Good  Recall:  of Knowledge:  Fair  Language:  Good  Akathisia:  Negative  Assets:  Communication Skills Desire for Improvement Resilience Social Support  ADL's:  Independent  Cognition:  WNL  Sleep:  Number of Hours: 9.75   Treatment Plan Summary: Diagnoses / Active Problems: Schizophrenia by hx  PLAN: Safety and Monitoring:  -- Voluntary admission to inpatient psychiatric unit for safety, stabilization and treatment  -- Daily contact with patient to assess and evaluate symptoms and progress in treatment  --  Patient's case to be discussed  in multi-disciplinary team meeting  -- Observation Level : q15 minute checks  -- Vital signs:  q12 hours  -- Precautions: suicide, elopement, and assault  2. Psychiatric Diagnoses and Treatment:   Schizophrenia by hx  -Increase Abilify to 20 mg daily (needs 14 day overlap while LAI reaches steady state, day 5), given today's agitation and endorsing AH. -Abilify Maintena 400 mg IM (received 8/31 AM) Antipsychotic labs             EKG: Sinus brady, Qtc 401             Lipids: chol 224, TG 364, LDL 103              A1C: 5.4 AIMS: 0, no cogwheeling or rigidity, no tremor or bradykinesia (9/5)  -- Short Term Goals: Ability to verbalize feelings will improve, Ability to demonstrate self-control will improve, and Compliance with prescribed medications will improve  -- Long Term Goals: Improvement in symptoms so as ready for discharge  3. Medical Issues Being Addressed:   COVID Positive  - RT-PCR 07/25/21 COVID positive - Monitoring clinically at this time and patient presently with runny nose, HA, and ST - no respiratory distress noted, afebrile and Sats 100% RA  - Per IM recommendations no treatment warranted unless she becomes further symptomatic  - Ordered PRN Albuterol MDI and PRN Mucinex DM; started Vit C and Zinc supplements; Tylenol PRN; Cepacol PRN  - Encouraging fluid hydration   Anemia -Hgb of 9.9, recheck stable at 9.8 (pt with previous CBCs in 2021 from 10.3-10.7 and one from 2019 of 10.1), normocytic with MCV of 86.5, increased RDW of 18.1, Iron, ferritin, and TIBC WNLB12: 323; Folate: 4.6 (07/21/2021) (>5.9 is nml) -Reduced to ferrous sulfate 325mg  qod with bowel regimen for constipation -Continue Folic acid supplementation - Will need outpatient f/u for recheck after discharge   Constipation RESOLVED -Miralax changed to PRN daily    Hyperlipidemia/Hypertriglyceridemia -Discussed start of statin with patient or fenofibrate and she refused med -Recommended she have outpatient  follow up to manage hyperlipidemia  Hypokalemia RESOLVED - K+ on repeat 4.4 (8/29)  4. Discharge Planning:   -- Social work and case management to assist with discharge planning and identification of hospital follow-up needs prior to discharge  -- Estimated LOS: 2 days  -- Discharge Concerns: Need to establish a safety plan; Medication compliance and effectiveness  -- Discharge Goals: Return home with outpatient referrals for mental health follow-up including medication management/psychotherapy  03-25-1988, MD 07/27/2021, 9:45 AM

## 2021-07-27 NOTE — [facility] Group Notes (Signed)
Topic:   Due to the acuity, complex discharge plans, and Covid-19 precautions, group was not held. Patient was provided therapeutic worksheets and asked to meet with CSW as needed. 

## 2021-07-27 NOTE — Progress Notes (Signed)
Pt received worksheets for psycho-educational group. 

## 2021-07-27 NOTE — Progress Notes (Signed)
Pt just approached the nursing station very agitated and irritable, stating "I want to leave today and not tomorrow! I am not staying here another day so that you all can make fun of me so call my case worker and let her know!" Pt was reassured that nobody is making fun of her and all staff is here to help. Pt then stormed off to her room. Pt will be redirected as needed to maintain peace on the unit.

## 2021-07-27 NOTE — Progress Notes (Signed)
Pt has been calm and cooperative this evening, pt stated that she had a good day, good appetite and sleeps well at night. Pt denied SI/HI/AVH and contracted for safety, will continue to monitor.

## 2021-07-27 NOTE — Progress Notes (Signed)
  D: Patient is alert and oriented x 4. Patient denies SI/HI/ AVH and pain. Disposition is Calm and cooperative with appropriate affect. Verbally contracts for safety to this Clinical research associate.   A:  Pt was offered support and encouragement by this Clinical research associate. No signs of distress nor concerns reported by patient at present.Pt remains receptive to treatment and safety maintained on unit.    R: Will continue to monitor and assess. Pt was encouraged to wear face mask while on COVID positive unit. Safety maintained during this shift.       07/27/21 0000  Psych Admission Type (Psych Patients Only)  Admission Status Involuntary  Psychosocial Assessment  Patient Complaints None  Eye Contact Fair  Affect Appropriate to circumstance  Speech Logical/coherent  Interaction Minimal  Motor Activity Slow  Appearance/Hygiene Improved  Behavior Characteristics Appropriate to situation  Mood Depressed  Thought Process  Coherency WDL  Content WDL  Delusions None reported or observed  Perception WDL  Hallucination None reported or observed  Judgment Impaired  Confusion None  Danger to Self  Current suicidal ideation? Denies  Danger to Others  Danger to Others None reported or observed

## 2021-07-28 ENCOUNTER — Encounter: Payer: Self-pay | Admitting: Student

## 2021-07-28 DIAGNOSIS — F209 Schizophrenia, unspecified: Secondary | ICD-10-CM | POA: Diagnosis not present

## 2021-07-28 MED ORDER — MENTHOL 3 MG MT LOZG
1.0000 | LOZENGE | OROMUCOSAL | Status: DC | PRN
  Administered 2021-07-28: 3 mg via ORAL

## 2021-07-28 NOTE — Progress Notes (Signed)
Recreation Therapy Notes  9.6.22   Due to COVID outbreak on hall, group was not held as normally scheduled.  LRT left packets for patients dealing with symptoms of stress.  The packets dealt with examples of symptoms of stress, triggers to stress, things that are and aren't in the control of the patient and tips to manage time.    Herny Scurlock, LRT/CTRS        Vinia Jemmott A 07/28/2021 12:16 PM 

## 2021-07-28 NOTE — Progress Notes (Signed)
   07/28/21 0605  Vital Signs  Pulse Rate 76  BP 113/76  BP Location Right Arm  BP Method Automatic  Patient Position (if appropriate) Standing   D: Patient denies SI/HI. Patient reported hearing voices. Pt. Denied both anxiety and depression. Pt. Was out in open areas watching  tv in the dayroom. A:  Patient took scheduled medicine.  Support and encouragement provided Routine safety checks conducted every 15 minutes. Patient  Informed to notify staff with any concerns.   R:  Safety maintained.

## 2021-07-28 NOTE — Progress Notes (Signed)
Pt received worksheets for psycho-educational group. 

## 2021-07-28 NOTE — Progress Notes (Signed)
[facility], [place] MD Progress Note   07/28/2021 11:15 AM Stephanie [name]  MRN:  [id number]  Chief Complaint: Psychosis  Reason for Admission:Stephanie [name] is a 45 y.o. female with a history of schizophrenia and medication noncompliance, who was initially admitted for inpatient psychiatric hospitalization on 07/16/2021 for management of neglect of personal hygiene, property destruction of her apartment, and erratic behaviors prior to admission. The patient is currently on Hospital Day 11.   Chart Review from last 24 hours:  The patient's chart was reviewed and nursing notes were reviewed. The patient's case was discussed in multidisciplinary team meeting. Per nursing, no prns required and taking scheduled medications appropriately.  Information Obtained Today During Patient Interview: The patient was seen and evaluated on the unit with attending Dr. Viviano Simas. When asked about yesterday's auditory hallucination, patient stated "I thought that was days ago. I barely remember that". Patient denies AH/VH today. She denies ideas of reference, first rank symptoms, or current paranoia. On assessment today the patient reports that she has a sore throat, stuffed nose but felt better today. She denies CP, SOB, or GI symptoms. She states she is sleeping more since she feels unwell with COVID symptoms but reports good appetite and good po intake. She denies SI or HI. She denies medication side-effects. She agrees to continue to quarantine at this time. Pt was pleasant and cooperative. Patient states that she will attempt to communicate to property manager in future prior to her attempting to destroy her apartment in an attempt to "remodel". Patient understands how her actions warranted a hospitalization and she will abstain from such actions in the future. Patient interested in continuing LAI per month compared to daily medication. Patient interested in an ACT team referral as well as she presently has no outpatient follow up for  outpatient psychiatric medication management and LAI administration.   Principal Problem: Schizophrenia (HCC) Diagnosis: Principal Problem:   Schizophrenia (HCC)  Total Time spent with patient:  I personally spent 35 minutes on the unit in direct patient care. The direct patient care time included face-to-face time with the patient, reviewing the patient's chart, communicating with other professionals, and coordinating care. Greater than 50% of this time was spent in counseling or coordinating care with the patient regarding goals of hospitalization, psycho-education, and discharge planning needs.   Past Psychiatric History: schizophrenia, previous admission in November of 2021 for paranoia and AVH. She was discharged on Abilify 20 mg and Risperdal 4 mg. It appears she was not given an LAI at that hospitalization.  Past Medical History:  Past Medical History:  Diagnosis Date   Schizophrenia [facility])     Past Surgical History:  Procedure Laterality Date   INDUCED ABORTION     Family History:  Family History  Problem Relation Age of Onset   Cancer Father    Family Psychiatric  History: Yes, per patient's reports. A maternal cousin with hx of mental illness/suicide attempt. Survived.  Social History:  Social History   Substance and Sexual Activity  Alcohol Use Never     Social History   Substance and Sexual Activity  Drug Use Never    Social History   Socioeconomic History   Marital status: Unknown    Spouse name: Not on file   Number of children: Not on file   Years of education: Not on file   Highest education level: Not on file  Occupational History   Not on file  Tobacco Use   Smoking status: Never  Passive exposure: Never   Smokeless tobacco: Never  Vaping Use   Vaping Use: Former  Substance and Sexual Activity   Alcohol use: Never   Drug use: Never   Sexual activity: Never  Other Topics Concern   Not on file  Social History Narrative   ** Merged History  Encounter **       Social Determinants of Health   Financial Resource Strain: Not on file  Food Insecurity: Not on file  Transportation Needs: Not on file  Physical Activity: Not on file  Stress: Not on file  Social Connections: Not on file   Additional Social History:   Single, has no children, unemployed, lives in [place].    Sleep: 9.75 hours  Appetite: Good  Current Medications: Current Facility-Administered Medications  Medication Dose Route Frequency Provider Last Rate Last Admin   acetaminophen (TYLENOL) tablet 650 mg  650 mg Oral Q6H PRN Novella Olive, NP   650 mg at 07/24/21 0806   albuterol (VENTOLIN HFA) 108 (90 Base) MCG/ACT inhaler 2 puff  2 puff Inhalation Q4H PRN Comer Locket, MD       alum & mag hydroxide-simeth (MAALOX/MYLANTA) 200-200-20 MG/5ML suspension 30 mL  30 mL Oral Q4H PRN Novella Olive, NP       ARIPiprazole (ABILIFY) tablet 20 mg  20 mg Oral Daily Mason Jim, Amy E, MD   20 mg at 07/28/21 0745   ascorbic acid (VITAMIN C) tablet 500 mg  500 mg Oral Daily Mason Jim, Amy E, MD   500 mg at 07/28/21 0745   dextromethorphan-guaiFENesin (MUCINEX DM) 30-600 MG per 12 hr tablet 1 tablet  1 tablet Oral BID PRN Comer Locket, MD   1 tablet at 07/28/21 0745   ferrous sulfate tablet 325 mg  325 mg Oral Adalberto Ill, Amy E, MD   325 mg at 07/28/21 0745   folic acid (FOLVITE) tablet 1 mg  1 mg Oral Daily Carlyn Reichert, MD   1 mg at 07/28/21 0746   hydrOXYzine (ATARAX/VISTARIL) tablet 25 mg  25 mg Oral TID PRN Novella Olive, NP   25 mg at 07/21/21 1647   OLANZapine zydis (ZYPREXA) disintegrating tablet 5 mg  5 mg Oral Q8H PRN Carlyn Reichert, MD   5 mg at 07/27/21 4403   And   LORazepam (ATIVAN) tablet 1 mg  1 mg Oral PRN Carlyn Reichert, MD       And   ziprasidone (GEODON) injection 20 mg  20 mg Intramuscular PRN Carlyn Reichert, MD       magnesium hydroxide (MILK OF MAGNESIA) suspension 30 mL  30 mL Oral Daily PRN Novella Olive, NP        polyethylene glycol (MIRALAX / GLYCOLAX) packet 17 g  17 g Oral Daily PRN Park Pope, MD       traZODone (DESYREL) tablet 50 mg  50 mg Oral QHS PRN Novella Olive, NP   50 mg at 07/22/21 2033   zinc sulfate capsule 220 mg  220 mg Oral Daily Bartholomew Crews E, MD   220 mg at 07/28/21 0745    Lab Results:  No results found for this or any previous visit (from the past 48 hour(s)).   Blood Alcohol level:  Lab Results  Component Value Date   [facility] <10 07/13/2021   ETH <10 05/01/2021    Metabolic Disorder Labs: Lab Results  Component Value Date   HGBA1C 5.4 07/20/2021   MPG 108.28 07/20/2021   MPG 128  05/02/2021   No results found for: PROLACTIN Lab Results  Component Value Date   CHOL 224 (H) 07/17/2021   TRIG 364 (H) 07/17/2021   HDL 48 07/17/2021   CHOLHDL 4.7 07/17/2021   VLDL 73 (H) 07/17/2021   LDLCALC 103 (H) 07/17/2021   LDLCALC 58 10/15/2020    Physical Findings:  Musculoskeletal: Strength & Muscle Tone: within normal limits Gait & Station: normal, steady Patient leans: NA  Psychiatric Specialty Exam: Physical Exam Vitals and nursing note reviewed.  Constitutional:      Appearance: Normal appearance.  HENT:     Head: Normocephalic and atraumatic.  Cardiovascular:     Rate and Rhythm: Normal rate and regular rhythm.     Pulses: Normal pulses.     Heart sounds: Normal heart sounds.  Pulmonary:     Effort: Pulmonary effort is normal. No respiratory distress.     Breath sounds: Normal breath sounds. No wheezing.  Chest:     Chest wall: No tenderness.  Neurological:     General: No focal deficit present.    Review of Systems  Constitutional:  Negative for fatigue and fever.  HENT:  Positive for congestion and sore throat.   Respiratory:  Negative for shortness of breath.   Cardiovascular:  Negative for chest pain.  Gastrointestinal:  Negative for abdominal pain, constipation, diarrhea, nausea and vomiting.  Musculoskeletal:  Negative for arthralgias and  myalgias.  Neurological:  Negative for dizziness and headaches.   Blood pressure 113/76, pulse 76, temperature 98.4 F (36.9 C), temperature source Oral, resp. rate 16, height 4\' 11"  (1.499 m), weight 58.1 kg, SpO2 100 %.Body mass index is 25.85 kg/m.  General Appearance:  casually dressed, adequate hygiene  Eye Contact:  Good  Speech:  Clear and Coherent and Normal Rate  Volume:  Normal  Mood:  ambivalent, aloof  Affect:  Constricted  Thought Process:  Goal Directed and Linear  Orientation:  Full (Time, Place, and Person)  Thought Content: Pt denies VH, ideas of reference or first rank symptoms; not grossly responding to internal/external stimuli on exam with physicians but reportedly was responding to AH earlier this morning- endorses AH calling her a "nasty ass". Appears guarded.  Suicidal Thoughts:  No  Homicidal Thoughts:  No  Memory:  Recent;   Good  Judgement:  Fair - taking medications  Insight:  Poor  Psychomotor Activity:  Normal - no cogwheeling, no stiffness, no tremor, AIMS 0  Concentration:  Concentration: Good and Attention Span: Good  Recall:  [employer]  Fund of Knowledge:  Fair  Language:  Good  Akathisia:  Negative  Assets:  Communication Skills Desire for Improvement Resilience Social Support  ADL's:  Independent  Cognition:  WNL  Sleep:  Number of Hours: 4   Treatment Plan Summary: Diagnoses / Active Problems: Schizophrenia by hx  PLAN: Safety and Monitoring:  -- Voluntary admission to inpatient psychiatric unit for safety, stabilization and treatment  -- Daily contact with patient to assess and evaluate symptoms and progress in treatment  -- Patient's case to be discussed in multi-disciplinary team meeting  -- Observation Level : q15 minute checks  -- Vital signs:  q12 hours  -- Precautions: suicide, elopement, and assault  2. Psychiatric Diagnoses and Treatment:   Schizophrenia by hx  -Continue Abilify to 20 mg daily (needs 14 day overlap while LAI  reaches steady state, day 5), given today's agitation and endorsing AH. -Abilify Maintena 400 mg IM (received 8/31 AM) Antipsychotic labs  EKG: Sinus brady, Qtc 401             Lipids: chol 224, TG 364, LDL 103              A1C: 5.4 AIMS: 0, no cogwheeling or rigidity, no tremor or bradykinesia (9/5)  -- Short Term Goals: Ability to verbalize feelings will improve, Ability to demonstrate self-control will improve, and Compliance with prescribed medications will improve  -- Long Term Goals: Improvement in symptoms so as ready for discharge  3. Medical Issues Being Addressed:   COVID Positive  - RT-PCR 07/25/21 COVID positive - Monitoring clinically at this time and patient presently with runny nose, HA, and ST - no respiratory distress noted, afebrile and Sats 100% RA  - Per IM recommendations no treatment warranted unless she becomes further symptomatic  - Ordered PRN Albuterol MDI and PRN Mucinex DM; started Vit C and Zinc supplements; Tylenol PRN; Cepacol PRN  - Encouraging fluid hydration   Anemia -Hgb of 9.9, recheck stable at 9.8 (pt with previous CBCs in 2021 from 10.3-10.7 and one from 2019 of 10.1), normocytic with MCV of 86.5, increased RDW of 18.1, Iron, ferritin, and TIBC WNLB12: 323; Folate: 4.6 (07/21/2021) (>5.9 is nml) -Reduced to ferrous sulfate 325mg  qod with bowel regimen for constipation -Continue Folic acid supplementation - Will need outpatient f/u for recheck after discharge   Constipation RESOLVED -Miralax changed to PRN daily    Hyperlipidemia/Hypertriglyceridemia -Discussed start of statin with patient or fenofibrate and she refused med -Recommended she have outpatient follow up to manage hyperlipidemia  Hypokalemia RESOLVED - K+ on repeat 4.4 (8/29)  4. Discharge Planning:   -- Social work and case management to assist with discharge planning and identification of hospital follow-up needs prior to discharge  -- Estimated LOS: 7-14 days  --  Discharge Concerns: Need to establish a safety plan; Medication compliance and effectiveness  -- Discharge Goals: Return home with outpatient referrals for mental health follow-up including medication management/psychotherapy  03-25-1988, MD 07/28/2021, 11:15 AM

## 2021-07-28 NOTE — Progress Notes (Signed)
DAR NOTE: Patient presents with anxious affect and depressed mood.  Complained of headache 6/10, tylenol 65 mg PO given. Denies auditory and visual hallucinations.  Maintained on routine safety checks.  Support and encouragement offered as needed. Will continue to monitor.

## 2021-07-28 NOTE — Progress Notes (Signed)
Pt did not attend 1:1 Orientation/Goals group. 

## 2021-07-29 MED ORDER — ARIPIPRAZOLE 20 MG PO TABS: 20.0000 mg | ORAL_TABLET | Freq: Every day | ORAL | 0 refills | Status: DC

## 2021-07-29 NOTE — Progress Notes (Signed)
RN met with pt and reviewed pt's discharge instructions. Pt verbalized understanding of discharge instructions and pt did not have any questions. RN reviewed and provided pt with a copy of SRA, AVS and Transition Record. RN returned pt's belongings to pt. Medication samples were given to pt. Pt denied SI/HI/AVH and voiced no concerns. Pt was appreciative of the care pt received at [facility]. Patient discharged to the lobby without incident. Patient was picked up by social worker from her ACT team.

## 2021-07-29 NOTE — [facility] Group Notes (Signed)
[facility] Group Notes:  (Nursing/MHT/Case Management/Adjunct)  Date:  07/29/2021  Time:  10:22 AM  Type of Therapy:   1:1 Orientation/Goals Group  Participation Level:  Minimal  Participation Quality:  Appropriate  Affect:  Flat  Cognitive:  Appropriate  Insight:  Appropriate and Good  Engagement in Group:  Engaged  Modes of Intervention:  Orientation  Summary of Progress/Problems: Pt goal for today is to get discharged.   Stephanie [name] J [name] 07/29/2021, 10:22 AM

## 2021-07-29 NOTE — Discharge Summary (Addendum)
Physician Discharge Summary Note  Patient:  Stephanie [name] is an 45 y.o., female MRN:  [id number] DOB:  05/07/1976 Patient phone:  [phone] (home)  Patient address:   [address] [place] [address],  Total Time spent with patient: I personally spent 60 minutes on the unit in direct patient care. The direct patient care time included face-to-face time with the patient, reviewing the patient's chart, communicating with other professionals, and coordinating care. Greater than 50% of this time was spent in counseling or coordinating care with the patient regarding goals of hospitalization, psycho-education, and discharge planning needs.   Date of Admission:  07/16/2021 Date of Discharge: 07/29/2021   Reason for Admission:  Stephanie [name] is a 45 y.o. female with a history of schizophrenia and medication noncompliance, who was initially admitted for inpatient psychiatric hospitalization on 07/16/2021 for management of neglect of personal hygiene, property destruction of her apartment, and erratic behaviors prior to admission.   Chart Review Patient presented initially voluntarily arrived 8/22 to [facility] via [facility] for mental health evaluation after patient's landlord called [employer] for disturbance. Patient was found destroying her home and kitchen cabinets stating that she was "remodeling her apartment". Patient was IVC'd by ER MD at that that time because of patient's erratic behavior.   Pertinent labs: RT-PCR COVID negative, CMP Potassium 3.3 (all other wnl), negative UDS, negative ethanol, negative pregnancy test, negative beta HCG, lipid panel: cholesterol 224 triglyceride 364 VLDL 73 LDL Cholesterol 103, negative TSH.   [facility] consult indicated patient was noncompliant with medication regiment and needed inpatient for psychiatric stabilization.   Per H&P (07/17/21) "During this evaluation, Anie reports,  "I was taken to the [facility] ED on the 22nd of this month by Ms.  Tanya from [facility].. They wanted me to come to the hospital for mental health evaluation because I tore my apartment up a week ago. I tore it up because I was sick of living there. I have been living in this apartment for almost a year. My apartment is not in the right location or neighborhood. I was told the last time I was in this hospital that I had mental health issues., but I seek a second opinion in [place]. I was informed by a psychiatrist over there that I did not have any mental health issues, rather that I had eating disorder. They told me to just eat & I will be healed. I have been doing well mentally. I have not been taking any medications for a while & I do not want to be on any medications or drugs. I was recently jailed for one week in [facility] for trespassing. When I got released, they put me on probation. My probation was transferred to [place] when I moved up there. I have completed the probation. I'm not depressed or anxious. I sleep well. No voices or seeing things. I just have a headache, the nurse has given me some tylenol already. Just to let you know, I'm allergic to Tylenol & penicillin"."   Principal Problem: Schizophrenia [facility]) Discharge Diagnoses: Principal Problem:   Schizophrenia (HCC)   Past Psychiatric History: See H&P  Past Medical History:  Past Medical History:  Diagnosis Date   Schizophrenia (HCC)     Past Surgical History:  Procedure Laterality Date   INDUCED ABORTION     Family History:  Family History  Problem Relation Age of Onset   Cancer Father    Family Psychiatric  History:  See H&P Social History:  Social History   Substance and Sexual Activity  Alcohol Use Never     Social History   Substance and Sexual Activity  Drug Use Never    Social History   Socioeconomic History   Marital status: Unknown    Spouse name: Not on file   Number of children: Not on file   Years of education: Not on file   Highest  education level: Not on file  Occupational History   Not on file  Tobacco Use   Smoking status: Never    Passive exposure: Never   Smokeless tobacco: Never  Vaping Use   Vaping Use: Former  Substance and Sexual Activity   Alcohol use: Never   Drug use: Never   Sexual activity: Never  Other Topics Concern   Not on file  Social History Narrative   ** Merged History Encounter **       Social Determinants of Health   Financial Resource Strain: Not on file  Food Insecurity: Not on file  Transportation Needs: Not on file  Physical Activity: Not on file  Stress: Not on file  Social Connections: Not on file    Hospital Course:    After the above admission evaluation, patient's presenting symptoms were noted. She was recommended for antipsychotic treatment. The medication regimen targeting those presenting symptoms were discussed with her & initiated with her consent. Patient was started on Abilify 10 mg po daily. Due to medication noncompliance history, she received an Abilify Maintena 400 mg on 8/31 with 14 day bridge of Abilify. ECG ordered and showed Sinus bradycardia with Qtc of 401. AIMS test demonstrated no signs of tardive dyskinesia.  Patient was found to be anemic with hemoglobin of 9.8 so patient was put on ferrous sulfate 325 mg BID and folic acid due to low folate. Patient also placed on Miralax due to constipation. Patient tested positive for COVID 19 on 9/3 which required a 5 day quarantine. Patient agreed to 5 day quarantine and symptomatic treatment ordered as needed.  During the course of her hospitalization, the 15-minute checks were adequate to ensure patient's safety. Patient did exhibit agitated behavior due to responding to internal stimuli on 8/31. Patient was placed on agitation protocol and required zyprexa zydis a few times prior to discharge. She interacted with patients & staff more appropriately as hospitalization progressed, participated appropriately in the  group sessions/therapies. Her medications were addressed & adjusted to meet her needs. She was recommended for outpatient follow up.  At the time of discharge patient is not reporting any acute suicidal/homicidal ideations/VH, delusional thoughts or paranoia. She does not appear to be responding to any internal stimul. Patient does still endorse some AH but states she is able to cope with with them appropriately. She feels more confident about her self-care & in managing her mental health. She currently denies any new issues or concerns. Education and supportive counseling provided throughout her hospital stay & upon discharge.  Today upon her discharge evaluation with the attending psychiatrist, patient states she is doing "good". Patient denies any specific concerns. Patient slept well, appetite good, regular bowel movements. Patient denies any physical complaints. . She feels that her medications have been helpful & is in agreement to continue her current treatment regimen as recommended. She was able to engage in safety planning including plan to return to [facility] or contact emergency services if she feels unable to maintain her own safety or the safety of others. Pt had no  further questions, comments, or concerns. She left [facility] with all personal belongings in no apparent distress. Transportation per private vehicle with her case worker.  Physical Findings: AIMS: Facial and Oral Movements Muscles of Facial Expression: None, normal Lips and Perioral Area: None, normal Jaw: None, normal Tongue: None, normal,Extremity Movements Upper (arms, wrists, hands, fingers): None, normal Lower (legs, knees, ankles, toes): None, normal, Trunk Movements Neck, shoulders, hips: None, normal, Overall Severity Severity of abnormal movements (highest score from questions above): None, normal Incapacitation due to abnormal movements: None, normal Patient's awareness of abnormal movements (rate only patient's report): No  Awareness, Dental Status Current problems with teeth and/or dentures?: No Does patient usually wear dentures?: No    Musculoskeletal: Strength & Muscle Tone: within normal limits Gait & Station: normal Patient leans: N/A   Psychiatric Specialty Exam:  Presentation  General Appearance: Appropriate for Environment; Casual; Neat  Eye Contact:Good  Speech:Normal Rate  Speech Volume:Normal  Handedness:Right   Mood and Affect  Mood:Euthymic  Affect:Congruent   Thought Process  Thought Processes:Coherent  Descriptions of Associations:Intact  Orientation:Full (Time, Place and Person)  Thought Content:Logical  History of Schizophrenia/Schizoaffective disorder:Yes  Duration of Psychotic Symptoms:Greater than six months  Hallucinations:Hallucinations: Auditory Description of Auditory Hallucinations: voices that say negative things about her. Specifically denies command auditory hallucinations. She states that she is able to distract herself from them.  Ideas of Reference:None  Suicidal Thoughts:Suicidal Thoughts: No  Homicidal Thoughts:Homicidal Thoughts: No   Sensorium  Memory:Immediate Good; Recent Good; Remote Fair  Judgment:Fair  Insight:Shallow   Executive Functions  Concentration:Good  Attention Span:Good  Recall:Good  Fund of Knowledge:Good  Language:Good   Psychomotor Activity  Psychomotor Activity:Psychomotor Activity: Normal   Assets  Assets:Communication Skills; Desire for Improvement; Housing; Leisure Time; Physical Health; Resilience; Social Support (Has a case Production designer, theatre/television/film)   Sleep  Sleep:Sleep: Fair Number of Hours of Sleep: 4.5    Physical Exam: Physical Exam Vitals and nursing note reviewed.  Constitutional:      Appearance: Normal appearance. She is normal weight.  HENT:     Head: Normocephalic and atraumatic.  Pulmonary:     Effort: Pulmonary effort is normal.  Neurological:     General: No focal deficit present.      Mental Status: She is oriented to person, place, and time.   Review of Systems  Respiratory:  Negative for shortness of breath.   Cardiovascular:  Negative for chest pain.  Gastrointestinal:  Negative for abdominal pain, constipation, diarrhea, heartburn, nausea and vomiting.  Neurological:  Negative for headaches.  Blood pressure 116/84, pulse 74, temperature 98.9 F (37.2 C), temperature source Oral, resp. rate 16, height 4\' 11"  (1.499 m), weight 58.1 kg, SpO2 100 %. Body mass index is 25.85 kg/m.   Social History   Tobacco Use  Smoking Status Never   Passive exposure: Never  Smokeless Tobacco Never   Tobacco Cessation:  N/A, patient does not currently use tobacco products   Blood Alcohol level:  Lab Results  Component Value Date   ETH <10 07/13/2021   ETH <10 05/01/2021    Metabolic Disorder Labs:  Lab Results  Component Value Date   HGBA1C 5.4 07/20/2021   MPG 108.28 07/20/2021   MPG 128 05/02/2021   No results found for: PROLACTIN Lab Results  Component Value Date   CHOL 224 (H) 07/17/2021   TRIG 364 (H) 07/17/2021   HDL 48 07/17/2021   CHOLHDL 4.7 07/17/2021   VLDL 73 (H) 07/17/2021   LDLCALC 103 (H) 07/17/2021  LDLCALC 58 10/15/2020    See Psychiatric Specialty Exam and Suicide Risk Assessment completed by Attending Physician prior to discharge.  Discharge destination:  Home  Is patient on multiple antipsychotic therapies at discharge:  No  Has Patient had three or more failed trials of antipsychotic monotherapy by history:  No  Recommended Plan for Multiple Antipsychotic Therapies: NA   Allergies as of 07/29/2021       Reactions   Haldol [haloperidol Lactate] Anaphylaxis   Penicillins Nausea And Vomiting   Did it involve swelling of the face/tongue/throat, SOB, or low BP? No Did it involve sudden or severe rash/hives, skin peeling, or any reaction on the inside of your mouth or nose? No Did you need to seek medical attention at a hospital or  doctor's office? No When did it last happen?      unknown  If all above answers are "NO", may proceed with cephalosporin use.   Penicillins Nausea And Vomiting        Medication List     STOP taking these medications    acetaminophen 325 MG tablet Commonly known as: TYLENOL       TAKE these medications      Indication  ARIPiprazole 20 MG tablet Commonly known as: ABILIFY Take 1 tablet (20 mg total) by mouth daily for 7 days. Start taking on: July 30, 2021  Indication: Schizophrenia        Follow-up Information     [facility] Follow up.   Specialty: Behavioral Health Why: You may go to this provider for therapy and medication management services during walk in hours:  Monday through Wednesday, from 7:45 am to 11:00 am.  Services are provided on a first come, first served basis. Contact information: [address] [place] [phone]                Follow-up recommendations:  Activity:  as tolerated Diet:  heart healthy   Comments:  Prescriptions were given at discharge.  Patient is agreeable with the discharge plan.  She was given an opportunity to ask questions.  She appears to feel comfortable with discharge and denies any current suicidal or homicidal thoughts.    Patient is instructed prior to discharge to: Take all medications as prescribed by her mental healthcare provider. Report any adverse effects and or reactions from the medicines to her outpatient provider promptly. In the event of worsening symptoms, patient is instructed to call the crisis hotline, 911 and or go to the nearest ED for appropriate evaluation and treatment of symptoms. Patient is to follow-up with her primary care provider for your other medical issues, concerns and or health care needs.   Signed: Park PopeAndrew Oriel Rumbold, MD 07/29/2021, 1:36 PM

## 2021-07-29 NOTE — BHH Suicide Risk Assessment (Signed)
[facility] Discharge Suicide Risk Assessment   Principal Problem: Schizophrenia [facility]) Discharge Diagnoses: Principal Problem:   Schizophrenia (HCC)   Total Time spent with patient:  35 minutes Patient is seen and discussed with treatment team.  A meeting by video was completed with social work and patient's case Production designer, theatre/television/film.  It is agreed that patient is back to her baseline and can be discharged.  She is able to state positive coping strategies for when she is hearing auditory hallucinations or when she is wanting to remodel her apartment.  She is aware that should she damage property again in the future she will be evicted.  Patient does not have access to weapons.  She denies suicidal and homicidal ideation, plan, or intent.  She denies any past history of harming self or others.  She reports auditory hallucinations that say derogatory things about her that come and go.  She is able to list all past medications she has tried in the past which have been ineffective in eliminating these voices.  She denies visual hallucinations.  She states resources that she can reach out to should she have any worsening symptoms.  She states she is agreeable to follow-up, and hopes for an ACT team to ensure that she can continue on long-acting injectable medication.  At this time, she states that she will also continue oral medication.  Musculoskeletal: Strength & Muscle Tone: within normal limits Gait & Station: normal Patient leans: N/A  Psychiatric Specialty Exam  Presentation  General Appearance: Appropriate for Environment; Casual; Neat  Eye Contact:Good  Speech:Normal Rate  Speech Volume:Normal  Handedness:Right   Mood and Affect  Mood:Euthymic  Duration of Depression Symptoms: Less than two weeks  Affect:Congruent   Thought Process  Thought Processes:Coherent  Descriptions of Associations:Intact  Orientation:Full (Time, Place and Person)  Thought Content:Logical  History of  Schizophrenia/Schizoaffective disorder:Yes  Duration of Psychotic Symptoms:Greater than six months  Hallucinations:Hallucinations: Auditory Description of Auditory Hallucinations: voices that say negative things about her. Specifically denies command auditory hallucinations. She states that she is able to distract herself from them. Ideas of Reference:None  Suicidal Thoughts:Suicidal Thoughts: No Homicidal Thoughts:Homicidal Thoughts: No  Sensorium  Memory:Immediate Good; Recent Good; Remote Fair  Judgment:Fair  Insight:Shallow   Executive Functions  Concentration:Good  Attention Span:Good  Recall:Good  Fund of Knowledge:Good  Language:Good   Psychomotor Activity  Psychomotor Activity: Psychomotor Activity: Normal  Assets  Assets:Communication Skills; Desire for Improvement; Housing; Leisure Time; Physical Health; Resilience; Social Support (Has a case Production designer, theatre/television/film)   Sleep  Sleep: Sleep: Fair Number of Hours of Sleep: 4.5  Physical Exam: Physical Exam Vitals and nursing note reviewed. Exam conducted with a chaperone present.  Constitutional:      Appearance: Normal appearance.  HENT:     Head: Normocephalic and atraumatic.  Cardiovascular:     Rate and Rhythm: Normal rate.  Pulmonary:     Effort: Pulmonary effort is normal. No respiratory distress.  Neurological:     General: No focal deficit present.     Mental Status: She is alert and oriented to person, place, and time.   Review of Systems  Constitutional: Negative.   Respiratory: Negative.    Cardiovascular: Negative.   Gastrointestinal: Negative.   Neurological: Negative.   Psychiatric/Behavioral:  Positive for hallucinations (AH at baseline). Negative for depression, memory loss, substance abuse and suicidal ideas. The patient is not nervous/anxious and does not have insomnia.   Blood pressure 116/84, pulse 74, temperature 98.9 F (37.2 C), temperature source Oral, resp.  rate 16, height 4\' 11"   (1.499 m), weight 58.1 kg, SpO2 100 %. Body mass index is 25.85 kg/m.  Mental Status Per Nursing Assessment::   On Admission:  NA  Demographic Factors:  Living alone  Loss Factors: uninsured  Historical Factors: Family history of mental illness or substance abuse and Impulsivity  Risk Reduction Factors:   Positive social support and Positive coping skills or problem solving skills  Continued Clinical Symptoms:  Schizophrenia  Cognitive Features That Contribute To Risk:  None    Suicide Risk:  Minimal: No identifiable suicidal ideation.  Patients presenting with no risk factors but with morbid ruminations; may be classified as minimal risk based on the severity of the depressive symptoms   Follow-up Information     [facility] Follow up.   Specialty: Behavioral Health Why: You may go to this provider for therapy and medication management services during walk in hours:  Monday through Wednesday, from 7:45 am to 11:00 am.  Services are provided on a first come, first served basis. Contact information: [address] [place] [phone]                Plan Of Care/Follow-up recommendations:  Activity:  ad lib Diet:  as tolerated  On day of discharge following sustained improvement in the affect of this patient, continued report of euthymic mood, repeated denial of suicidal, homicidal, and other violent ideation, adequate interaction with peers, active participation in groups while on the unit, and denial of adverse reactions from medications, the treatment team decided Stephanie [name] was stable for discharge home with scheduled mental health treatment as noted above.  She was able to engage in safety planning including plan to return to nearest emergency room or contact emergency services if she feels unable to maintain her own safety or the safety of others. Patient had no further questions, comments, or concerns.   Discharge into care of case manager, who agrees to maintain patient safety.  Patient aware to return to nearest crisis center, ED or to call 911 for worsening symptoms of depression, suicidal or homicidal thoughts or AVH.   Richardean Sale, MD 07/29/2021, 11:23 AM

## 2021-07-29 NOTE — Progress Notes (Signed)
  [facility] Adult Case Management Discharge Plan :  Will you be returning to the same living situation after discharge:  Yes,  personal home At discharge, do you have transportation home?: Yes,  case manager Do you have the ability to pay for your medications: No.  Release of information consent forms completed and in the chart;  Patient's signature needed at discharge.  Patient to Follow up at:  Follow-up Information     [facility] Follow up.   Specialty: Behavioral Health Why: You may go to this provider for therapy and medication management services during walk in hours:  Monday through Wednesday, from 7:45 am to 11:00 am.  Services are provided on a first come, first served basis. Contact information: [address] [place] [address] [phone]                Next level of care provider has access to [facility] Link:yes  Safety Planning and Suicide Prevention discussed: Yes,  case manager     Has patient been referred to the Quitline?: N/A patient is not a smoker  Patient has been referred for addiction treatment: N/A  Felizardo Hoffmann, LCSWA 07/29/2021, 10:07 AM

## 2021-07-29 NOTE — BHH Counselor (Signed)
CSW facilitated a Facetime between pt and Case Worker- [employer] Ms. Jenness Corner discussed the importance of pt going to her aftercare appointments and continuing medication post-discharge. Ms. Jenness Corner also discussed that if pt continues to destroy property that she will lose her housing. Ms. Jenness Corner stated she would pick pt up from [facility] at 11:30am.  Fredirick Lathe, LCSWA Clinicial Social Worker [facility]

## 2021-07-31 ENCOUNTER — Emergency Department (HOSPITAL_COMMUNITY): Admission: EM | Admit: 2021-07-31 | Discharge: 2021-07-31 | Discharge status: Against Medical Advice | Payer: Self-pay

## 2021-07-31 ENCOUNTER — Emergency Department (HOSPITAL_COMMUNITY)
Admission: EM | Admit: 2021-07-31 | Discharge: 2021-08-01 | Disposition: A | Discharge status: Home / Self Care | Payer: Self-pay | Attending: Emergency Medicine | Admitting: Emergency Medicine

## 2021-07-31 ENCOUNTER — Other Ambulatory Visit: Payer: Self-pay

## 2021-07-31 DIAGNOSIS — F259 Schizoaffective disorder, unspecified: Secondary | ICD-10-CM | POA: Insufficient documentation

## 2021-07-31 DIAGNOSIS — F209 Schizophrenia, unspecified: Secondary | ICD-10-CM | POA: Insufficient documentation

## 2021-07-31 DIAGNOSIS — Y900 Blood alcohol level of less than 20 mg/100 ml: Secondary | ICD-10-CM | POA: Insufficient documentation

## 2021-07-31 DIAGNOSIS — Z20822 Contact with and (suspected) exposure to covid-19: Secondary | ICD-10-CM | POA: Insufficient documentation

## 2021-07-31 DIAGNOSIS — Z87891 Personal history of nicotine dependence: Secondary | ICD-10-CM | POA: Insufficient documentation

## 2021-07-31 DIAGNOSIS — R440 Auditory hallucinations: Secondary | ICD-10-CM | POA: Diagnosis present

## 2021-07-31 NOTE — ED Provider Notes (Signed)
Emergency Medicine Provider Triage Evaluation Note  Stephanie [name] , a 45 y.o. female  was evaluated in triage.  Pt complains of auditory hallucinations.  Patient recently admitted to behavioral health and August of this year.  States that since being discharged she has continued to have persistent auditory hallucinations.  She states that they occur throughout the day and  "tell her to get up, go here, and go there".  Denies any SI/HI or visual hallucinations.  Reports rhinorrhea but otherwise has no physical complaints.  Physical Exam  BP 128/73 (BP Location: Left Arm)   Pulse 81   Temp 98.2 F (36.8 C) (Oral)   Resp 18   Ht 5\' 1"  (1.549 m)   Wt 65.8 kg   SpO2 100%   BMI 27.40 kg/m  Gen:   Awake, no distress   Resp:  Normal effort  MSK:   Moves extremities without difficulty  Other:    Medical Decision Making  Medically screening exam initiated at 11:32 PM.  Appropriate orders placed.  Latronda Spink [name] was informed that the remainder of the evaluation will be completed by another provider, this initial triage assessment does not replace that evaluation, and the importance of remaining in the ED until their evaluation is complete.   Richardean Sale, PA-C 07/31/21 [id number]    [id number], MD 08/10/21 [phone]

## 2021-07-31 NOTE — ED Notes (Signed)
No answer x2 

## 2021-07-31 NOTE — ED Triage Notes (Signed)
Pt recently discharged from [facility]. Pt came in with cc of auditory hallucinations. Pt states the voices keep saying the same thing over. Taking meds as prescribed. No SI/HI

## 2021-07-31 NOTE — ED Notes (Signed)
No answer x1

## 2021-08-01 ENCOUNTER — Encounter (HOSPITAL_COMMUNITY): Payer: Self-pay | Admitting: Emergency Medicine

## 2021-08-01 DIAGNOSIS — F209 Schizophrenia, unspecified: Secondary | ICD-10-CM

## 2021-08-01 DIAGNOSIS — R440 Auditory hallucinations: Secondary | ICD-10-CM

## 2021-08-01 DIAGNOSIS — F259 Schizoaffective disorder, unspecified: Secondary | ICD-10-CM

## 2021-08-01 LAB — COMPREHENSIVE METABOLIC PANEL
ALT: 39 U/L (ref 0–44)
AST: 25 U/L (ref 15–41)
Albumin: 4 g/dL (ref 3.5–5.0)
Alkaline Phosphatase: 81 U/L (ref 38–126)
Anion gap: 11 (ref 5–15)
BUN: 27 mg/dL — ABNORMAL HIGH (ref 6–20)
CO2: 26 mmol/L (ref 22–32)
Calcium: 9.1 mg/dL (ref 8.9–10.3)
Chloride: 107 mmol/L (ref 98–111)
Creatinine, Ser: 0.83 mg/dL (ref 0.44–1.00)
GFR, Estimated: >60 mL/min (ref >60)
Glucose, Bld: 136 mg/dL — ABNORMAL HIGH (ref 70–99)
Potassium: 3.8 mmol/L (ref 3.5–5.1)
Sodium: 144 mmol/L (ref 135–145)
Total Bilirubin: 0.4 mg/dL (ref 0.3–1.2)
Total Protein: 8 g/dL (ref 6.5–8.1)

## 2021-08-01 LAB — CBC WITH DIFFERENTIAL/PLATELET
Abs Immature Granulocytes: 0.03 10*3/uL (ref 0.00–0.07)
Basophils Absolute: 0 10*3/uL (ref 0.0–0.1)
Basophils Relative: 0 %
Eosinophils Absolute: 0.1 10*3/uL (ref 0.0–0.5)
Eosinophils Relative: 2 %
HCT: 31 % — ABNORMAL LOW (ref 36.0–46.0)
Hemoglobin: 9.3 g/dL — ABNORMAL LOW (ref 12.0–15.0)
Immature Granulocytes: 1 %
Lymphocytes Relative: 35 %
Lymphs Abs: 2.1 10*3/uL (ref 0.7–4.0)
MCH: 26.8 pg (ref 26.0–34.0)
MCHC: 30 g/dL (ref 30.0–36.0)
MCV: 89.3 fL (ref 80.0–100.0)
Monocytes Absolute: 0.4 10*3/uL (ref 0.1–1.0)
Monocytes Relative: 7 %
Neutro Abs: 3.4 10*3/uL (ref 1.7–7.7)
Neutrophils Relative %: 55 %
Platelets: 393 10*3/uL (ref 150–400)
RBC: 3.47 MIL/uL — ABNORMAL LOW (ref 3.87–5.11)
RDW: 17.4 % — ABNORMAL HIGH (ref 11.5–15.5)
WBC: 6.2 10*3/uL (ref 4.0–10.5)
nRBC: 0 % (ref 0.0–0.2)

## 2021-08-01 LAB — ACETAMINOPHEN LEVEL: Acetaminophen (Tylenol), Serum: <10 ug/mL — ABNORMAL LOW (ref 10–30)

## 2021-08-01 LAB — RAPID URINE DRUG SCREEN, HOSP PERFORMED
Amphetamines: NOT DETECTED
Barbiturates: NOT DETECTED
Benzodiazepines: NOT DETECTED
Cocaine: NOT DETECTED
Opiates: NOT DETECTED
Tetrahydrocannabinol: NOT DETECTED

## 2021-08-01 LAB — RESP PANEL BY RT-PCR (FLU A&B, COVID) ARPGX2
Influenza A by PCR: NEGATIVE
Influenza B by PCR: NEGATIVE
SARS Coronavirus 2 by RT PCR: POSITIVE — AB

## 2021-08-01 LAB — I-STAT BETA HCG BLOOD, ED (MC, WL, AP ONLY): I-stat hCG, quantitative: <5 m[IU]/mL (ref <5)

## 2021-08-01 LAB — SALICYLATE LEVEL: Salicylate Lvl: <7 mg/dL — ABNORMAL LOW (ref 7.0–30.0)

## 2021-08-01 LAB — ETHANOL: Alcohol, Ethyl (B): <10 mg/dL (ref <10)

## 2021-08-01 MED ORDER — ARIPIPRAZOLE 10 MG PO TABS
20.0000 mg | ORAL_TABLET | Freq: Every day | ORAL | Status: DC
  Administered 2021-08-01: 20 mg via ORAL
  Filled 2021-08-01: qty 2

## 2021-08-01 MED ORDER — ACETAMINOPHEN 325 MG PO TABS: 650.0000 mg | ORAL_TABLET | ORAL | Status: DC | PRN

## 2021-08-01 NOTE — BH Assessment (Addendum)
Comprehensive Clinical Assessment (CCA) Note  08/01/2021 Stephanie [name] [id number]  Disposition: Stephanie Naegeli, NP, patient recommended for observation for psych reassessment later this AM. Stephanie Dimmer, RN, informed of disposition.  The patient demonstrates the following risk factors for suicide: Chronic risk factors for suicide include: psychiatric disorder of schizophrenia . Acute risk factors for suicide include: recent discharge from inpatient psychiatry. Protective factors for this patient include: coping skills. Considering these factors, the overall suicide risk at this point appears to be moderate. Patient is appropriate for outpatient follow up.  Flowsheet Row ED from 07/31/2021 in [facility] Admission (Discharged) [facility]  C-SSRS RISK CATEGORY No Risk No Risk No Risk      Chief Complaint:  Chief Complaint  Patient presents with   Hallucinations   Visit Diagnosis:  Schizophrenia  Stephanie [name] is a 45 year old female presenting voluntary to [facility] due to hallucinations. Patient denied SI, HI and alcohol/drug usage. Patient discharged on 07/29/21 from [facility]. Patient reported when she was discharge she was still hearing voices, however today the voices became unmanageable. Patient reported voices telling her "be careful, watch it, they are calling me by my childhood nickname and they threatened me if I go on a date". Patient stated "the voices never went away, they got worse today". Patient reported her Abilify was increased from 10mg  to 20mg . Patient denied visual hallucinations. Patient reported living alone. Patient denied access to guns. Patient reported no additional changes since discharged. Patient was eager and cooperative during assessment.   CCA Biopsychosocial Patient Reported Schizophrenia/Schizoaffective Diagnosis in Past:  Yes  Strengths: self-awareness  Mental Health Symptoms Depression:   None   Duration of Depressive symptoms:    Mania:   None   Anxiety:    None   Psychosis:   Hallucinations; Delusions   Duration of Psychotic symptoms:    Trauma:   None   Obsessions:   None   Compulsions:   None   Inattention:   None   Hyperactivity/Impulsivity:   N/A   Oppositional/Defiant Behaviors:   None   Emotional Irregularity:   None   Other Mood/Personality Symptoms:  No data recorded   Mental Status Exam Appearance and self-care  Stature:   Average (Pt laying in bed, UTA.)   Weight:   Average weight   Clothing:   Neat/clean   Grooming:   Normal   Cosmetic use:   None   Posture/gait:   Normal   Motor activity:   Not Remarkable   Sensorium  Attention:   Normal   Concentration:   Normal   Orientation:   X5   Recall/memory:   Normal   Affect and Mood  Affect:   Other (Comment) (Pleasant.)   Mood:   Other (Comment) (Pleasant.)   Relating  Eye contact:   Normal   Facial expression:  No data recorded  Attitude toward examiner:   Cooperative   Thought and Language  Speech flow:  Normal   Thought content:   Appropriate to Mood and Circumstances   Preoccupation:   Other (Comment) (Hearing voices.)   Hallucinations:   Auditory   Organization:  No data recorded  of Knowledge:   Fair   Intelligence:   Average   Abstraction:   -- )   Judgement:   Fair   Reality Testing:   -- (UTA)   Insight:  Fair   Decision Making:  No data recorded  Social Functioning  Social Maturity:   -- Industrial/product designer)   Social Judgement:   -- (UTA)   Stress  Stressors:   Other (Comment) (Constantly hearing voices.)   Coping Ability:   Deficient supports   Skill Deficits:   Decision making   Supports:   Support needed    Religion: Religion/Spirituality Are You A Religious Person?:  Yes  Leisure/Recreation: Leisure / Recreation Do You Have Hobbies?: Yes Leisure and Hobbies: Reading.  Exercise/Diet: Exercise/Diet Do You Exercise?: No (Pt reported, she used to workout 4 days a week.) Do You Follow a Special Diet?: No Do You Have Any Trouble Sleeping?: Yes  CCA Employment/Education Employment/Work Situation: Employment / Work Situation Employment Situation: Unemployed Patient's Job has Been Impacted by Current Illness: Yes (Though pt denies) Has Patient ever Been in the [employer]?: No  Education: Education Last Grade Completed: 12 Did You Product manager?: Yes Did You Have An Individualized Education Program (IIEP): No Did You Have Any Difficulty At School?: No  CCA Family/Childhood History Family and Relationship History: Family history Marital status: Single Does patient have children?: No  Childhood History:  Childhood History By whom was/is the patient raised?: Both parents Description of patient's current relationship with siblings: "they do not answer my calls anymore." Did patient suffer any verbal/emotional/physical/sexual abuse as a child?: No Did patient suffer from severe childhood neglect?: No Has patient ever been sexually abused/assaulted/raped as an adolescent or adult?: No Was the patient ever a victim of a crime or a disaster?: No Witnessed domestic violence?: No Has patient been affected by domestic violence as an adult?: No  Child/Adolescent Assessment:   CCA Substance Use Alcohol/Drug Use: Alcohol / Drug Use Pain Medications: See MAR Prescriptions: See MAR Over the Counter: See MAR History of alcohol / drug use?: No history of alcohol / drug abuse Longest period of sobriety (when/how long): Pt denies SA   ASAM's:  Six Dimensions of Multidimensional Assessment  Dimension 1:  Acute Intoxication and/or Withdrawal Potential:      Dimension 2:  Biomedical Conditions and Complications:      Dimension 3:  Emotional,  Behavioral, or Cognitive Conditions and Complications:     Dimension 4:  Readiness to Change:     Dimension 5:  Relapse, Continued use, or Continued Problem Potential:     Dimension 6:  Recovery/Living Environment:     ASAM Severity Score:    ASAM Recommended Level of Treatment:     Substance use Disorder (SUD)   Recommendations for Services/Supports/Treatments: Recommendations for Services/Supports/Treatments Recommendations For Services/Supports/Treatments: Other (Comment) ([facility] Continuous Observation Unit.)  Discharge Disposition:   DSM5 Diagnoses: Patient Active Problem List   Diagnosis Date Noted   Acute prerenal azotemia 05/02/2021   Dehydration 05/02/2021   Nausea & vomiting 05/02/2021   Headache 05/02/2021   AKI (acute kidney injury) (HCC) 05/01/2021   Schizophrenia (HCC) 10/10/2020   Schizoaffective disorder (HCC) 03/31/2019   Atypical chest pain 08/18/2015   Referrals to Alternative Service(s): Referred to Alternative Service(s):   Place:   Date:   Time:    Referred to Alternative Service(s):   Place:   Date:   Time:    Referred to Alternative Service(s):   Place:   Date:   Time:    Referred to Alternative Service(s):   Place:   Date:   Time:     Burnetta Sabin, [facility]

## 2021-08-01 NOTE — Discharge Instructions (Addendum)
Mental Health [facility]-will provide timely access to mental health services for children and adolescents (4-17) and adults presenting in a mental health crisis. The program is designed for those who need urgent Behavioral Health or Substance Use treatment and are not experiencing a medical crisis that would typically require an emergency room visit.    [address] [place], [place] [address] Phone: [phone] [email or web address]   [facility] will also offer the following outpatient services: (Monday through Friday 8am-5pm)   Partial Hospitalization Program (PHP) Substance Abuse Intensive Outpatient Program (SA-IOP) Group Therapy Medication Management Peer Living Room   We also provide (24/7):    Assessments: Our mental health clinician and providers will conduct a focused mental health evaluation, assessing for immediate safety concerns and further mental health needs.   Referral: Our team will provide resources and help connect to community based mental health treatment, when indicated, including psychotherapy, psychiatry, and other specialized behavioral health or substance use disorder services (for those not already in treatment).   Transitional Care: Our team providers in person bridging and/or telphonic follow-up during the patient's transition to outpatient services.     [facility] is an Development worker, community that delivers a full range of services directly to people who have been diagnosed with serious mental illness.  The goal of [facility] is to provide all services necessary to help the individual reduce or eliminate the symptoms of their illness and live a life not dominated by mental illness.  Address: [address] [place], [place] [address] Phone#: [phone] Crisis#: [phone] [facility]  Address: [address] [place], [place] [address]  Phone: [phone]  [facility] Address: [address] [place], [place], [place] [address] Phone: [phone] [facility] Address: [address], [place], [place] [address] Phone: [phone]  Please contact resource for assistance [facility] 24-Hour Call Center: [phone] Behavioral Health Crisis Line: [phone]

## 2021-08-01 NOTE — ED Provider Notes (Signed)
[facility] Provider Note   CSN: [id number] Arrival date & time: 07/31/21  2317     History Chief Complaint  Patient presents with   Hallucinations    Stephanie [name] is a 45 y.o. female.  The history is provided by the patient.  Mental Health Problem Presenting symptoms: no aggressive behavior, no agitation, no bizarre behavior, no delusions, no depression, no disorganized speech, no disorganized thought process, no hallucinations, no homicidal ideas, no paranoid behavior, no self-mutilation, no suicidal thoughts, no suicidal threats and no suicide attempt   Patient accompanied by:  Caregiver Degree of incapacity (severity):  Moderate Onset quality:  Gradual Duration:  6 months Timing:  Constant Progression:  Worsening Chronicity:  New Context: not alcohol use, not drug abuse and not medication   Treatment compliance:  All of the time Time since last dose of psychoactive medication: hours. Relieved by:  Nothing Worsened by:  Nothing Ineffective treatments:  None tried Associated symptoms: no anxiety, no appetite change, no chest pain, no decreased need for sleep, not distractible, no euphoric mood, no fatigue, no feelings of worthlessness, no headaches and no weight change   Risk factors: hx of mental illness       Past Medical History:  Diagnosis Date   Schizophrenia [facility])     Patient Active Problem List   Diagnosis Date Noted   Acute prerenal azotemia 05/02/2021   Dehydration 05/02/2021   Nausea & vomiting 05/02/2021   Headache 05/02/2021   AKI (acute kidney injury) (HCC) 05/01/2021   Schizophrenia (HCC) 10/10/2020   Schizoaffective disorder (HCC) 03/31/2019   Atypical chest pain 08/18/2015    Past Surgical History:  Procedure Laterality Date   INDUCED ABORTION       OB History   No obstetric history on file.     Family History  Problem Relation Age of Onset   Cancer Father     Social History   Tobacco Use    Smoking status: Never    Passive exposure: Never   Smokeless tobacco: Never  Vaping Use   Vaping Use: Former  Substance Use Topics   Alcohol use: Never   Drug use: Never    Home Medications Prior to Admission medications   Medication Sig Start Date End Date Taking? Authorizing Provider  ARIPiprazole (ABILIFY) 20 MG tablet Take 1 tablet (20 mg total) by mouth daily for 7 days. 07/30/21 08/06/21  Park Pope, MD    Allergies    Haldol [haloperidol lactate], Penicillins, and Penicillins  Review of Systems   Review of Systems  Constitutional:  Negative for appetite change and fatigue.  HENT:  Negative for drooling.   Respiratory:  Negative for wheezing.   Cardiovascular:  Negative for chest pain.  Gastrointestinal:  Negative for vomiting.  Genitourinary:  Negative for difficulty urinating.  Musculoskeletal:  Negative for neck stiffness.  Skin:  Negative for rash.  Neurological:  Negative for headaches.  Psychiatric/Behavioral:  Negative for agitation, hallucinations, homicidal ideas, paranoia, self-injury and suicidal ideas. The patient is not nervous/anxious.   All other systems reviewed and are negative.  Physical Exam Updated Vital Signs BP 128/73 (BP Location: Left Arm)   Pulse 81   Temp 98.2 F (36.8 C) (Oral)   Resp 18   Ht 5\' 1"  (1.549 m)   Wt 65.8 kg   SpO2 100%   BMI 27.40 kg/m   Physical Exam Vitals and nursing note reviewed.  Constitutional:      General: She is not in  acute distress.    Appearance: Normal appearance.  HENT:     Head: Normocephalic and atraumatic.     Nose: Nose normal.  Eyes:     Conjunctiva/sclera: Conjunctivae normal.     Pupils: Pupils are equal, round, and reactive to light.  Cardiovascular:     Rate and Rhythm: Normal rate and regular rhythm.     Pulses: Normal pulses.     Heart sounds: Normal heart sounds.  Pulmonary:     Effort: Pulmonary effort is normal.     Breath sounds: Normal breath sounds.  Abdominal:     General:  Abdomen is flat. Bowel sounds are normal.     Palpations: Abdomen is soft.     Tenderness: There is no abdominal tenderness. There is no guarding.  Musculoskeletal:        General: Normal range of motion.     Cervical back: Normal range of motion and neck supple.  Skin:    General: Skin is warm and dry.     Capillary Refill: Capillary refill takes less than 2 seconds.  Neurological:     General: No focal deficit present.     Mental Status: She is alert and oriented to person, place, and time.     Deep Tendon Reflexes: Reflexes normal.  Psychiatric:        Mood and Affect: Mood normal.        Behavior: Behavior normal.    ED Results / Procedures / Treatments   Labs (all labs ordered are listed, but only abnormal results are displayed) Results for orders placed or performed during the hospital encounter of 07/31/21  Comprehensive metabolic panel  Result Value Ref Range   Sodium 144 135 - 145 mmol/L   Potassium 3.8 3.5 - 5.1 mmol/L   Chloride 107 98 - 111 mmol/L   CO2 26 22 - 32 mmol/L   Glucose, Bld 136 (H) 70 - 99 mg/dL   BUN 27 (H) 6 - 20 mg/dL   Creatinine, Ser 6.96 0.44 - 1.00 mg/dL   Calcium 9.1 8.9 - 29.5 mg/dL   Total Protein 8.0 6.5 - 8.1 g/dL   Albumin 4.0 3.5 - 5.0 g/dL   AST 25 15 - 41 U/L   ALT 39 0 - 44 U/L   Alkaline Phosphatase 81 38 - 126 U/L   Total Bilirubin 0.4 0.3 - 1.2 mg/dL   GFR, Estimated >28 >41 mL/min   Anion gap 11 5 - 15  Ethanol  Result Value Ref Range   Alcohol, Ethyl (B) <10 <10 mg/dL  CBC with Diff  Result Value Ref Range   WBC 6.2 4.0 - 10.5 K/uL   RBC 3.47 (L) 3.87 - 5.11 MIL/uL   Hemoglobin 9.3 (L) 12.0 - 15.0 g/dL   HCT 32.4 (L) 40.1 - 02.7 %   MCV 89.3 80.0 - 100.0 fL   MCH 26.8 26.0 - 34.0 pg   MCHC 30.0 30.0 - 36.0 g/dL   RDW 25.3 (H) 66.4 - 40.3 %   Platelets 393 150 - 400 K/uL   nRBC 0.0 0.0 - 0.2 %   Neutrophils Relative % 55 %   Neutro Abs 3.4 1.7 - 7.7 K/uL   Lymphocytes Relative 35 %   Lymphs Abs 2.1 0.7 - 4.0 K/uL    Monocytes Relative 7 %   Monocytes Absolute 0.4 0.1 - 1.0 K/uL   Eosinophils Relative 2 %   Eosinophils Absolute 0.1 0.0 - 0.5 K/uL   Basophils Relative 0 %  Basophils Absolute 0.0 0.0 - 0.1 K/uL   Immature Granulocytes 1 %   Abs Immature Granulocytes 0.03 0.00 - 0.07 K/uL  Salicylate level  Result Value Ref Range   Salicylate Lvl <7.0 (L) 7.0 - 30.0 mg/dL  Acetaminophen level  Result Value Ref Range   Acetaminophen (Tylenol), Serum <10 (L) 10 - 30 ug/mL  I-Stat beta hCG blood, ED  Result Value Ref Range   I-stat hCG, quantitative <5.0 <5 mIU/mL   Comment 3           No results found.   EKG None  Radiology No results found.  Procedures Procedures   Medications Ordered in ED Medications - No data to display  ED Course  I have reviewed the triage vital signs and the nursing notes.  Pertinent labs & imaging results that were available during my care of the patient were reviewed by me and considered in my medical decision making (see chart for details).    MDM Rules/Calculators/A&P                           Stephanie [name] was evaluated in Emergency Department on 08/01/2021 for the symptoms described in the history of present illness. She was evaluated in the context of the global COVID-19 pandemic, which necessitated consideration that the patient might be at risk for infection with the SARS-CoV-2 virus that causes COVID-19. Institutional protocols and algorithms that pertain to the evaluation of patients at risk for COVID-19 are in a state of rapid change based on information released by regulatory bodies including the CDC and federal and state organizations. These policies and algorithms were followed during the patient's care in the ED.  Final Clinical Impression(s) / ED Diagnoses Final diagnoses:  Schizophrenia, unspecified type (HCC)   Medically cleared by me for psych.    The patient has been placed in psychiatric observation due to the need to provide a  safe environment for the patient while obtaining psychiatric consultation and evaluation, as well as ongoing medical and medication management to treat the patient's condition.  The patient has not been placed under full IVC at this time.  Rx / DC Orders ED Discharge Orders     None        Babbie Dondlinger, MD 08/01/21 [id number]

## 2021-08-01 NOTE — Consult Note (Addendum)
Stephanie [name] Stephanie [name] Psych ED Discharge  08/01/2021 1:09 PM Stephanie [name]  MRN:  [id number]  Method of visit?: Face to Face   Principal Problem: Auditory hallucinations Discharge Diagnoses: Principal Problem:   Auditory hallucinations Active Problems:   Schizoaffective disorder (HCC)   Subjective:  "They're gone now. I've taken my Abilify"  Patient presents alert and oriented in bed eating lunch. Alert and oriented. Calm and cooperative during assessment. She denies any active auditory or visual hallucinations, suicidal or homicidal ideations. She states she lives alone in [place]. Denies any safety concerns with returning home. Says she has a case Production designer, theatre/television/film through local shelter who assists her with community needs. Eye contact stable; she does not appear to be actively psychotic or responding to any external/internal stimuli at the time of this assessment. Patient states she has outpatient appointment scheduled, ACTT services pending, and Abilify at home and is not in need of prescription at this time.   Total Time spent with patient: 20 minutes  Past Psychiatric History:   -schizoaffective disorder  -schizophrenia  Past Medical History:  Past Medical History:  Diagnosis Date   Schizophrenia (HCC)     Past Surgical History:  Procedure Laterality Date   INDUCED ABORTION     Family History:  Family History  Problem Relation Age of Onset   Cancer Father    Family Psychiatric  History: not noted Social History:  Social History   Substance and Sexual Activity  Alcohol Use Never     Social History   Substance and Sexual Activity  Drug Use Never    Social History   Socioeconomic History   Marital status: Unknown    Spouse name: Not on file   Number of children: Not on file   Years of education: Not on file   Highest education level: Not on file  Occupational History   Not on file  Tobacco Use   Smoking status: Never    Passive exposure: Never   Smokeless tobacco: Never   Vaping Use   Vaping Use: Former  Substance and Sexual Activity   Alcohol use: Never   Drug use: Never   Sexual activity: Never  Other Topics Concern   Not on file  Social History Narrative   ** Merged History Encounter **       Social Determinants of Health   Financial Resource Strain: Not on file  Food Insecurity: Not on file  Transportation Needs: Not on file  Physical Activity: Not on file  Stress: Not on file  Social Connections: Not on file    Tobacco Cessation:  N/A, patient does not currently use tobacco products  Current Medications: Current Facility-Administered Medications  Medication Dose Route Frequency Provider Last Rate Last Admin   acetaminophen (TYLENOL) tablet 650 mg  650 mg Oral Q4H PRN Palumbo, April, MD       ARIPiprazole (ABILIFY) tablet 20 mg  20 mg Oral Daily Leevy-Johnson, Saliou Barnier A, NP   20 mg at 08/01/21 1013   Current Outpatient Medications  Medication Sig Dispense Refill   ARIPiprazole (ABILIFY) 20 MG tablet Take 1 tablet (20 mg total) by mouth daily for 7 days. 7 tablet 0   PTA Medications: (Not in a [name] admission)   Musculoskeletal: Strength & Muscle Tone: within normal limits Gait & Station: normal Patient leans: N/A  Psychiatric Specialty Exam:  Presentation  General Appearance: Casual  Eye Contact:Fair  Speech:Normal Rate; Clear and Coherent  Speech Volume:Normal  Handedness:Right   Mood and Affect  Mood:Euthymic  Affect:Congruent   Thought Process  Thought Processes:Coherent  Descriptions of Associations:Intact  Orientation:Full (Time, Place and Person)  Thought Content:Logical  History of Schizophrenia/Schizoaffective disorder:Yes  Duration of Psychotic Symptoms:Greater than six months  Hallucinations:Hallucinations: None Description of Auditory Hallucinations: "they're quiet now. I've had my Abilify".  Ideas of Reference:None  Suicidal Thoughts:Suicidal Thoughts: No  Homicidal  Thoughts:Homicidal Thoughts: No   Sensorium  Memory:Immediate Good; Recent Good; Remote Good  Judgment:Fair  Insight:Fair   Executive Functions  Concentration:Fair  Attention Span:Good  Recall:Good  Fund of Knowledge:Fair  Language:Fair   Psychomotor Activity  Psychomotor Activity:Psychomotor Activity: Normal   Assets  Assets:Communication Skills; Desire for Improvement; Financial Resources/Insurance; Housing; Physical Health; Resilience; Social Support   Sleep  Sleep:Sleep: Fair    Physical Exam: Physical Exam Vitals and nursing note reviewed.  HENT:     Head: Normocephalic.     Nose: Nose normal.     Mouth/Throat:     Mouth: Mucous membranes are moist.     Pharynx: Oropharynx is clear.  Eyes:     Pupils: Pupils are equal, round, and reactive to light.  Cardiovascular:     Rate and Rhythm: Normal rate.  Pulmonary:     Effort: Pulmonary effort is normal.     Comments: Cough noted Musculoskeletal:        General: Normal range of motion.     Cervical back: Normal range of motion.  Skin:    General: Skin is warm and dry.  Neurological:     General: No focal deficit present.     Mental Status: She is alert and oriented to person, place, and time. Mental status is at baseline.  Psychiatric:        Attention and Perception: Attention and perception normal.        Mood and Affect: Mood and affect normal.        Speech: Speech normal.        Behavior: Behavior normal. Behavior is cooperative.        Thought Content: Thought content normal.        Cognition and Memory: Cognition and memory normal.        Judgment: Judgment normal.   Review of Systems  Psychiatric/Behavioral:  Negative for hallucinations, substance abuse and suicidal ideas.   All other systems reviewed and are negative. Blood pressure 113/65, pulse 65, temperature 98.2 F (36.8 C), temperature source Oral, resp. rate 18, height 5\' 1"  (1.549 m), weight 65.8 kg, SpO2 100 %. Body mass index  is 27.4 kg/m.   Demographic Factors:  Low socioeconomic status and Living alone  Loss Factors: Decline in physical health  Historical Factors: Family history of mental illness or substance abuse  Risk Reduction Factors:   Sense of responsibility to family, Positive social support, and Positive coping skills or problem solving skills  Continued Clinical Symptoms:  Schizophrenia:   Paranoid or undifferentiated type Medical Diagnoses and Treatments/Surgeries  Cognitive Features That Contribute To Risk:  None    Suicide Risk:  Mild:  Suicidal ideation of limited frequency, intensity, duration, and specificity.  There are no identifiable plans, no associated intent, mild dysphoria and related symptoms, good self-control (both objective and subjective assessment), few other risk factors, and identifiable protective factors, including available and accessible social support.   Plan Of Care/Follow-up recommendations:  Continue to take medications as prescribed. Follow-up with outpatient provider as discussed.   Disposition: Discharge home with self-care and individual follow-up.  , NP 08/01/2021, 1:09 PM

## 2021-08-01 NOTE — Progress Notes (Signed)
CSW contacted the [facility] to check for enhanced services and to initiate a Care Coordination referral for the patient to assist with linking to additional resources upon discharge. It was reported that the [facility] can not release if the patient has enhance services, only to [employer]. However, It a Care Coordination referral was submitted and the patient should be contact within the next 7 days.   Crissie Reese, MSW, LCSW-A, LCAS-A Phone: [phone] Disposition/TOC

## 2021-08-27 ENCOUNTER — Encounter (HOSPITAL_COMMUNITY): Payer: Self-pay | Admitting: Emergency Medicine

## 2021-08-27 ENCOUNTER — Other Ambulatory Visit: Payer: Self-pay

## 2021-08-27 ENCOUNTER — Emergency Department (HOSPITAL_COMMUNITY)
Admission: EM | Admit: 2021-08-27 | Discharge: 2021-08-27 | Disposition: A | Discharge status: Home / Self Care | Payer: Self-pay | Attending: Emergency Medicine | Admitting: Emergency Medicine

## 2021-08-27 ENCOUNTER — Emergency Department (HOSPITAL_COMMUNITY): Payer: Self-pay

## 2021-08-27 DIAGNOSIS — Z23 Encounter for immunization: Secondary | ICD-10-CM | POA: Insufficient documentation

## 2021-08-27 DIAGNOSIS — W268XXA Contact with other sharp object(s), not elsewhere classified, initial encounter: Secondary | ICD-10-CM | POA: Insufficient documentation

## 2021-08-27 DIAGNOSIS — S61214A Laceration without foreign body of right ring finger without damage to nail, initial encounter: Secondary | ICD-10-CM | POA: Insufficient documentation

## 2021-08-27 MED ORDER — TETANUS-DIPHTH-ACELL PERTUSSIS 5-2.5-18.5 LF-MCG/0.5 IM SUSY
0.5000 mL | PREFILLED_SYRINGE | Freq: Once | INTRAMUSCULAR | Status: AC
  Administered 2021-08-27: 0.5 mL via INTRAMUSCULAR
  Filled 2021-08-27: qty 0.5

## 2021-08-27 NOTE — ED Provider Notes (Signed)
[facility]-EMERGENCY DEPT Provider Note   CSN: [id number] Arrival date & time: 08/27/21  1804     History Chief Complaint  Patient presents with   Finger Laceration    Stephanie [name] is a 45 y.o. female.  HPI Stephanie [name] is a 33 year old presenting to the Emergency Department without immunosuppressive proccess a finger laceration. She reports cutting her right 4th digit on a can of vegetables within two hours of arrival. She denies loss of sensation or movement and reports the bleeding is controlled a this time. She reports minimal pain if she isn't moving her finger, and denies radiation of pain. She reports last tetanus was 2020.   No other associated injuries no other associated complaints today.  Denies any aggravating or mitigating factors apart from worse with touch and movement of her finger.  No significant bleeding.   Past Medical History:  Diagnosis Date   Schizophrenia [facility])     Patient Active Problem List   Diagnosis Date Noted   Auditory hallucinations 08/01/2021   Acute prerenal azotemia 05/02/2021   Dehydration 05/02/2021   Nausea & vomiting 05/02/2021   Headache 05/02/2021   AKI (acute kidney injury) (HCC) 05/01/2021   Schizophrenia (HCC) 10/10/2020   Schizoaffective disorder (HCC) 03/31/2019   Atypical chest pain 08/18/2015    Past Surgical History:  Procedure Laterality Date   INDUCED ABORTION       OB History   No obstetric history on file.     Family History  Problem Relation Age of Onset   Cancer Father     Social History   Tobacco Use   Smoking status: Never    Passive exposure: Never   Smokeless tobacco: Never  Vaping Use   Vaping Use: Former  Substance Use Topics   Alcohol use: Never   Drug use: Never    Home Medications Prior to Admission medications   Medication Sig Start Date End Date Taking? Authorizing Provider  ARIPiprazole (ABILIFY) 20 MG tablet Take 1 tablet (20 mg total) by mouth daily  for 7 days. 07/30/21 08/06/21  Park Pope, MD    Allergies    Haldol [haloperidol lactate], Penicillins, and Penicillins  Review of Systems   Review of Systems  Constitutional:  Negative for fever.  Skin:  Positive for wound.  Allergic/Immunologic: Negative for immunocompromised state.  Neurological:  Negative for dizziness, weakness, light-headedness and numbness.   Physical Exam Updated Vital Signs BP 133/78   Pulse 71   Temp 98 F (36.7 C) (Oral)   Resp 17   Ht 5\' 1"  (1.549 m)   Wt 66.7 kg   SpO2 100%   BMI 27.78 kg/m   Physical Exam Vitals and nursing note reviewed.  Constitutional:      General: She is not in acute distress.    Appearance: Normal appearance. She is not ill-appearing.  HENT:     Head: Normocephalic and atraumatic.  Eyes:     General: No scleral icterus.       Right eye: No discharge.        Left eye: No discharge.     Conjunctiva/sclera: Conjunctivae normal.  Pulmonary:     Effort: Pulmonary effort is normal.     Breath sounds: No stridor.  Skin:    Capillary Refill: Capillary refill takes less than 2 seconds.     Comments: 1 cm superficial laceration on distal and radial aspect of 4th digit. Neurovascularly intact.  Neurological:     Mental Status:  She is alert and oriented to person, place, and time. Mental status is at baseline.     ED Results / Procedures / Treatments   Labs (all labs ordered are listed, but only abnormal results are displayed) Labs Reviewed - No data to display  EKG None  Radiology DG Finger Ring Right  Result Date: 08/27/2021 CLINICAL DATA:  Initial evaluation for acute injury, laceration. EXAM: RIGHT RING FINGER 2+V COMPARISON:  None. FINDINGS: Focal soft tissue defect and swelling seen adjacent to the fourth D IP joint. No radiopaque foreign body. No acute fracture dislocation. Joint spaces maintained. Osseous mineralization normal. IMPRESSION: 1. Focal soft tissue defect/swelling adjacent to the right fourth DIP  joint. No radiopaque foreign body. 2. No acute fracture or dislocation. Electronically Signed   By: Rise Mu M.D.   On: 08/27/2021 19:49    Procedures .Marland KitchenLaceration Repair  Date/Time: 08/27/2021 8:40 PM Performed by: Gailen Shelter, PA Authorized by: Gailen Shelter, PA   Consent:    Consent obtained:  Verbal   Consent given by:  Patient   Risks discussed:  Infection, need for additional repair, pain, poor cosmetic result and poor wound healing   Alternatives discussed:  No treatment and delayed treatment Universal protocol:    Procedure explained and questions answered to patient or proxy's satisfaction: yes     Relevant documents present and verified: yes     Test results available: yes     Imaging studies available: yes     Required blood products, implants, devices, and special equipment available: yes     Site/side marked: yes     Immediately prior to procedure, a time out was called: yes     Patient identity confirmed:  Verbally with patient Anesthesia:    Anesthesia method:  None Laceration details:    Location:  Finger   Finger location:  R ring finger   Length (cm):  1 Pre-procedure details:    Preparation:  Imaging obtained to evaluate for foreign bodies Exploration:    Hemostasis achieved with:  Direct pressure   Imaging obtained: x-ray     Imaging outcome: foreign body not noted     Wound exploration: entire depth of wound visualized     Wound extent: no foreign bodies/material noted and no tendon damage noted     Contaminated: no   Treatment:    Area cleansed with:  Saline   Amount of cleaning:  Standard   Irrigation solution:  Sterile saline   Irrigation method:  Pressure wash   Visualized foreign bodies/material removed: no     Debridement:  None   Undermining:  None   Scar revision: no   Skin repair:    Repair method:  Tissue adhesive Approximation:    Approximation:  Close Repair type:    Repair type:  Simple Post-procedure details:     Dressing:  Open (no dressing)   Procedure completion:  Tolerated well, no immediate complications   Medications Ordered in ED Medications  Tdap (BOOSTRIX) injection 0.5 mL (0.5 mLs Intramuscular Given 08/27/21 1932)    ED Course  I have reviewed the triage vital signs and the nursing notes.  Pertinent labs & imaging results that were available during my care of the patient were reviewed by me and considered in my medical decision making (see chart for details).    MDM Rules/Calculators/A&P  Pressure irrigation performed. Wound explored and base of wound visualized in a bloodless field and is without evidence of foreign body on xray.  Laceration occurred within 4 hours prior to repair which was well tolerated. Tdap updated today.  Pt has no comorbidities to effect normal wound healing. Pt discharged without antibiotics.  Shared decision making for suture vs steri strips vs dermabond, patient elected dermabond. Pt instructed to return to the ED sooner for signs of infection. Pt is hemodynamically stable with no complaints prior to dc.   X-ray personally reviewed negative for any abnormality/foreign body.  Agree with radiology read.  Return precautions given.   Final Clinical Impression(s) / ED Diagnoses Final diagnoses:  Laceration of right ring finger without foreign body without damage to nail, initial encounter    Rx / DC Orders ED Discharge Orders     None        Gailen Shelter, [place] 08/28/21 0015    Vanetta Mulders, MD 09/01/21 1541

## 2021-08-27 NOTE — ED Triage Notes (Signed)
Pt c/o right ring finger laceration. Cut it on a can. Bleeding controlled.

## 2021-08-27 NOTE — Discharge Instructions (Signed)
Dermabond repair Skin glue has the advantage of being less painful to apply and lower risk of infection, but do require more caution on your part, as they are more fragile than other skin closure techniques.  It's important to: -Keep the area clean and dry -Avoid picking at the glue or rubbing the area -Avoid soaking in water (showering is okay-bathing is not) -Glue will fall off of own accord

## 2021-08-27 NOTE — ED Notes (Signed)
An After Visit Summary was printed and given to the patient. Discharge instructions given and no further questions at this time.  

## 2022-03-24 ENCOUNTER — Encounter (HOSPITAL_COMMUNITY): Payer: Self-pay

## 2022-03-24 ENCOUNTER — Other Ambulatory Visit: Payer: Self-pay

## 2022-03-24 ENCOUNTER — Emergency Department (HOSPITAL_COMMUNITY)
Admission: EM | Admit: 2022-03-24 | Discharge: 2022-03-24 | Disposition: A | Discharge status: Home / Self Care | Payer: Self-pay | Attending: Emergency Medicine | Admitting: Emergency Medicine

## 2022-03-24 DIAGNOSIS — L853 Xerosis cutis: Secondary | ICD-10-CM | POA: Insufficient documentation

## 2022-03-24 DIAGNOSIS — R519 Headache, unspecified: Secondary | ICD-10-CM | POA: Insufficient documentation

## 2022-03-24 MED ORDER — HYDROXYZINE HCL 25 MG PO TABS: 25.0000 mg | ORAL_TABLET | Freq: Four times a day (QID) | ORAL | 0 refills | Status: DC

## 2022-03-24 MED ORDER — ACETAMINOPHEN 500 MG PO TABS
1000.0000 mg | ORAL_TABLET | Freq: Once | ORAL | Status: AC
  Administered 2022-03-24: 1000 mg via ORAL
  Filled 2022-03-24: qty 2
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
## 2022-03-24 NOTE — Discharge Instructions (Addendum)
Please pick up medication and take as needed for itching. It is also recommended that you apply OTC calamine lotion to your skin to help with dryness/itching.  ? ?Follow up with your apartment complex given concern for potential bugs in your apartment.  ? ?Take Tylenol as needed for headache.  ? ?Follow up with your PCP for further eval. If you do not have a PCP you can follow up with [facility] for primary care needs.  ? ?Return to the ED for any new/worsening symptoms ?
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
## 2022-04-05 ENCOUNTER — Emergency Department (HOSPITAL_COMMUNITY)
Admission: EM | Admit: 2022-04-05 | Discharge: 2022-04-07 | Disposition: A | Discharge status: Other Acute Inpatient Hospital | Payer: Self-pay | Attending: Emergency Medicine | Admitting: Emergency Medicine

## 2022-04-05 ENCOUNTER — Encounter (HOSPITAL_COMMUNITY): Payer: Self-pay | Admitting: Emergency Medicine

## 2022-04-05 ENCOUNTER — Other Ambulatory Visit: Payer: Self-pay

## 2022-04-05 DIAGNOSIS — Z20822 Contact with and (suspected) exposure to covid-19: Secondary | ICD-10-CM | POA: Insufficient documentation

## 2022-04-05 DIAGNOSIS — F209 Schizophrenia, unspecified: Secondary | ICD-10-CM | POA: Insufficient documentation

## 2022-04-05 DIAGNOSIS — Z046 Encounter for general psychiatric examination, requested by authority: Secondary | ICD-10-CM

## 2022-04-05 DIAGNOSIS — F29 Unspecified psychosis not due to a substance or known physiological condition: Secondary | ICD-10-CM | POA: Insufficient documentation

## 2022-04-05 DIAGNOSIS — Z79899 Other long term (current) drug therapy: Secondary | ICD-10-CM | POA: Insufficient documentation

## 2022-04-05 LAB — COMPREHENSIVE METABOLIC PANEL
ALT: 14 U/L (ref 0–44)
AST: 19 U/L (ref 15–41)
Albumin: 4 g/dL (ref 3.5–5.0)
Alkaline Phosphatase: 63 U/L (ref 38–126)
Anion gap: 16 — ABNORMAL HIGH (ref 5–15)
BUN: 9 mg/dL (ref 6–20)
CO2: 22 mmol/L (ref 22–32)
Calcium: 9.7 mg/dL (ref 8.9–10.3)
Chloride: 103 mmol/L (ref 98–111)
Creatinine, Ser: 0.94 mg/dL (ref 0.44–1.00)
GFR, Estimated: >60 mL/min (ref >60)
Glucose, Bld: 58 mg/dL — ABNORMAL LOW (ref 70–99)
Potassium: 3.4 mmol/L — ABNORMAL LOW (ref 3.5–5.1)
Sodium: 141 mmol/L (ref 135–145)
Total Bilirubin: 0.9 mg/dL (ref 0.3–1.2)
Total Protein: 7.6 g/dL (ref 6.5–8.1)

## 2022-04-05 LAB — CBC WITH DIFFERENTIAL/PLATELET
Abs Immature Granulocytes: 0.02 10*3/uL (ref 0.00–0.07)
Basophils Absolute: 0 10*3/uL (ref 0.0–0.1)
Basophils Relative: 0 %
Eosinophils Absolute: 0 10*3/uL (ref 0.0–0.5)
Eosinophils Relative: 1 %
HCT: 37.1 % (ref 36.0–46.0)
Hemoglobin: 11.6 g/dL — ABNORMAL LOW (ref 12.0–15.0)
Immature Granulocytes: 0 %
Lymphocytes Relative: 30 %
Lymphs Abs: 1.6 10*3/uL (ref 0.7–4.0)
MCH: 27.2 pg (ref 26.0–34.0)
MCHC: 31.3 g/dL (ref 30.0–36.0)
MCV: 86.9 fL (ref 80.0–100.0)
Monocytes Absolute: 0.3 10*3/uL (ref 0.1–1.0)
Monocytes Relative: 5 %
Neutro Abs: 3.3 10*3/uL (ref 1.7–7.7)
Neutrophils Relative %: 64 %
Platelets: 322 10*3/uL (ref 150–400)
RBC: 4.27 MIL/uL (ref 3.87–5.11)
RDW: 15 % (ref 11.5–15.5)
WBC: 5.3 10*3/uL (ref 4.0–10.5)
nRBC: 0 % (ref 0.0–0.2)

## 2022-04-05 LAB — SALICYLATE LEVEL: Salicylate Lvl: <7 mg/dL — ABNORMAL LOW (ref 7.0–30.0)

## 2022-04-05 LAB — ETHANOL: Alcohol, Ethyl (B): <10 mg/dL (ref <10)

## 2022-04-05 LAB — ACETAMINOPHEN LEVEL: Acetaminophen (Tylenol), Serum: <10 ug/mL — ABNORMAL LOW (ref 10–30)

## 2022-04-05 NOTE — ED Notes (Signed)
Belongings placed in locker 2.  
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
## 2022-04-05 NOTE — ED Notes (Signed)
TTS in process-Monique,RN  
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
## 2022-04-06 ENCOUNTER — Encounter (HOSPITAL_COMMUNITY): Payer: Self-pay | Admitting: Registered Nurse

## 2022-04-06 DIAGNOSIS — F29 Unspecified psychosis not due to a substance or known physiological condition: Secondary | ICD-10-CM

## 2022-04-06 LAB — RAPID URINE DRUG SCREEN, HOSP PERFORMED
Amphetamines: NOT DETECTED
Barbiturates: NOT DETECTED
Benzodiazepines: NOT DETECTED
Cocaine: NOT DETECTED
Opiates: NOT DETECTED
Tetrahydrocannabinol: NOT DETECTED

## 2022-04-06 MED ORDER — HYDROXYZINE HCL 25 MG PO TABS
25.0000 mg | ORAL_TABLET | Freq: Four times a day (QID) | ORAL | Status: DC
  Administered 2022-04-06 – 2022-04-07 (×5): 25 mg via ORAL
  Filled 2022-04-06 (×5): qty 1

## 2022-04-06 MED ORDER — ARIPIPRAZOLE 10 MG PO TABS
10.0000 mg | ORAL_TABLET | Freq: Every day | ORAL | Status: DC
  Administered 2022-04-06 – 2022-04-07 (×2): 10 mg via ORAL
  Filled 2022-04-06 (×2): qty 1

## 2022-04-06 NOTE — ED Notes (Signed)
3 ivc copies handed to monique ?

## 2022-04-06 NOTE — Consult Note (Addendum)
Telepsych Consultation   Reason for Consult:  bizarre behavior, IVC Referring Physician:  Dietrich Pates, PA-C Location of Patient: [facility] Location of Provider: Other: [facility]  Patient Identification: Stephanie [name] MRN:  [id number] Principal Diagnosis: Schizophrenia (HCC) Diagnosis:  Principal Problem:   Schizophrenia (HCC) Active Problems:   Involuntary commitment   Total Time spent with patient: 30 minutes  Subjective:   Stephanie [name] is a 46 y.o. female patient admitted to [facility] ED after presenting vial law enforcement under IVC with complaints that patient has been taking her medications, aggressive behavior, damaging, furniture, and scaring her neighbors with bizarre aggressive behavior  HPI:  Stephanie [name], 46 y.o., female patient seen via tele health by this provider, consulted with Dr. Nelly Rout; and chart reviewed on 09/07/22.  On evaluation Stephanie [name] reports she was brought to the emergency room related to a IVC.  Patient states that she is not really sure why the IVC was taken out.  Patient asked about removing appliances from her home and she states "The refrigerator had not but stated."  Patient reports she has bugs in her home.  "Roaches, bedbugs, and maybe even Tics."  States she moved furniture out of home because bugs all in it.  States that "the man hadn't been by in a while to spay." Patient denies suicidal/self-harm/homicidal ideation."  Patient reports she lives alone and has no support system.  Patient asked if she is spoken to the landlord about the insects being in her home she states "I'm just grateful to have a place to live.  Having said anything to the landlord." During evaluation Stephanie [name] is laying in bed with no distress noted.  She is alert, oriented x 4, calm and cooperative.   Depressed   with congruent affect.  She not appear to be responding to internal/external stimuli or delusional thoughts.  Patient denies suicidal/self-harm/homicidal  ideation, psychosis, and paranoia.  Patient answered question appropriately.  Patient does report that she has not been taking her Abilify related to she is unemployed and cannot afford it.  Patient also denies having any outpatient psychiatric services at this time.  Past Psychiatric History: See below  Risk to Self: Denies Risk to Others: Denies Prior Inpatient Therapy: Yes Prior Outpatient Therapy: Yes  Past Medical History:  Past Medical History:  Diagnosis Date   Schizophrenia (HCC)     Past Surgical History:  Procedure Laterality Date   INDUCED ABORTION     Family History:  Family History  Problem Relation Age of Onset   Cancer Father    Family Psychiatric  History: None reported Social History:  Social History   Substance and Sexual Activity  Alcohol Use Yes   Comment: occassional     Social History   Substance and Sexual Activity  Drug Use Never    Social History   Socioeconomic History   Marital status: Unknown    Spouse name: Not on file   Number of children: Not on file   Years of education: Not on file   Highest education level: Not on file  Occupational History   Not on file  Tobacco Use   Smoking status: Never    Passive exposure: Never   Smokeless tobacco: Never  Vaping Use   Vaping Use: Former  Substance and Sexual Activity   Alcohol use: Yes    Comment: occassional   Drug use: Never   Sexual activity: Never  Other Topics Concern   Not on file  Social History Narrative   ** Merged History Encounter **       Social Determinants of Health   Financial Resource Strain: Not on file  Food Insecurity: Not on file  Transportation Needs: Not on file  Physical Activity: Not on file  Stress: Not on file  Social Connections: Not on file   Additional Social History:    Allergies:   Allergies  Allergen Reactions   Haldol [Haloperidol Lactate] Anaphylaxis   Penicillins Nausea And Vomiting    Did it involve swelling of the  face/tongue/throat, SOB, or low BP? No Did it involve sudden or severe rash/hives, skin peeling, or any reaction on the inside of your mouth or nose? No Did you need to seek medical attention at a hospital or doctor's office? No When did it last happen?      unknown  If all above answers are "NO", may proceed with cephalosporin use.    Penicillins Nausea And Vomiting    Labs:  No results found for this or any previous visit (from the past 48 hour(s)).   Medications:  No current facility-administered medications for this encounter.   Current Outpatient Medications  Medication Sig Dispense Refill   ARIPiprazole ER (ABILIFY MAINTENA) 400 MG SRER injection Inject 2 mLs (400 mg total) into the muscle every 28 (twenty-eight) days. 1 each 0   ziprasidone (GEODON) 20 MG capsule Take 1 capsule (20 mg total) by mouth 2 (two) times daily with a meal. 60 capsule 0    Musculoskeletal: Strength & Muscle Tone: within normal limits Gait & Station: normal Patient leans: N/A          Psychiatric Specialty Exam:  Presentation  General Appearance: Appropriate for Environment  Eye Contact:Good  Speech:Clear and Coherent  Speech Volume:Normal  Handedness:Right   Mood and Affect  Mood:Anxious; Depressed  Affect:Appropriate; Congruent   Thought Process  Thought Processes:Coherent  Descriptions of Associations:Intact  Orientation:Full (Time, Place and Person)  Thought Content:Logical  History of Schizophrenia/Schizoaffective disorder:Yes  Duration of Psychotic Symptoms:Greater than six months  Hallucinations:No data recorded  Ideas of Reference:Paranoia  Suicidal Thoughts:No data recorded  Homicidal Thoughts:No data recorded   Sensorium  Memory:Immediate Fair; Recent Fair; Remote Fair  Judgment:Fair  Insight:Lacking   Executive Functions  Concentration:Fair  Attention Span:Fair  Recall:Fair  Fund of Knowledge:Fair  Language:Fair   Psychomotor  Activity  Psychomotor Activity:No data recorded  Assets  Assets:Housing; Social Support; Talents/Skills   Sleep  Sleep:No data recorded   Physical Exam: Physical Exam Vitals and nursing note reviewed. Exam conducted with a chaperone present.  Constitutional:      General: She is not in acute distress.    Appearance: Normal appearance. She is not ill-appearing.  Cardiovascular:     Rate and Rhythm: Normal rate.  Pulmonary:     Effort: Pulmonary effort is normal.  Neurological:     Mental Status: She is alert and oriented to person, place, and time.  Psychiatric:        Attention and Perception: Attention and perception normal. She does not perceive auditory or visual hallucinations.        Mood and Affect: Mood is depressed.        Speech: Speech normal.        Behavior: Behavior is cooperative.        Thought Content: Thought content does not include homicidal or suicidal ideation.        Judgment: Judgment is impulsive.    Review of Systems  Constitutional: Negative.  HENT: Negative.    Eyes: Negative.   Respiratory: Negative.    Cardiovascular: Negative.   Gastrointestinal: Negative.   Genitourinary: Negative.   Musculoskeletal: Negative.   Skin: Negative.   Neurological: Negative.   Endo/Heme/Allergies: Negative.   Psychiatric/Behavioral:  Depression: Stable. Hallucinations: Denies. Substance abuse: Denies. Suicidal ideas: Denies. The patient does not have insomnia. Nervous/anxious: Stable.       Reports she took refrigerator and other appliances out of house because had bugs "roaches" in it.   Blood pressure 112/82, pulse 71, temperature 98.7 F (37.1 C), resp. rate 16, height 5\' 1"  (1.549 m), weight 63.5 kg, SpO2 100 %. Body mass index is 26.45 kg/m.  Treatment Plan Summary: Daily contact with patient to assess and evaluate symptoms and progress in treatment, Medication management, and Plan Psychiatric hospitalization  Medication management: Restarted home  medications.  Decreased Abilify 20 mg to 10 mg nightly Meds ordered this encounter  Medications   DISCONTD: hydrOXYzine (ATARAX) tablet 25 mg   DISCONTD: ARIPiprazole (ABILIFY) tablet 10 mg     Disposition: Recommend psychiatric Inpatient admission when medically cleared.  This service was provided via telemedicine using a 2-way, interactive audio and video technology.  Names of all persons participating in this telemedicine service and their role in this encounter. Name: Assunta FoundShuvon Dawnell Bryant NP Role: Kri  Name: Owett Thigpin Role: Patient  Name:  Role:   Name:  Role:    Secure message sent to patient's nurse and social work informing psychiatric consult complete and patient recommended for inpatient psychiatric treatment.  [facility] to [facility] out.  Meds ordered this encounter  Medications   DISCONTD: hydrOXYzine (ATARAX) tablet 25 mg   DISCONTD: ARIPiprazole (ABILIFY) tablet 10 mg     Jaideep Pollack, NP 09/07/2022 9:58 AM

## 2022-04-06 NOTE — ED Notes (Signed)
Pt calm and cooperative, currently eating dinner. NAD at this time ?

## 2022-04-06 NOTE — ED Notes (Signed)
Patient ambulatory to bathroom. On way back from bathroom began cursing at staff and slinging items around in room. Stated "I heard someone say I was a man, y'all take y'alls slow ass time doing anything, I will do it myself. Got me eating garbage out of a trash can"  ?

## 2022-04-06 NOTE — BH Assessment (Signed)
Comprehensive Clinical Assessment (CCA) Note ? ?04/06/2022 ?Erick Alley D Therien ?[id number] ? ?Disposition: ?Lindon Romp, NP, patient meets inpatient criteria. Disposition SW to secure placement. Monique, RN, informed of disposition. ? ?The patient demonstrates the following risk factors for suicide: Chronic risk factors for suicide include: psychiatric disorder of schizophrenia unspecified . Acute risk factors for suicide include: unemployment and loss (financial, interpersonal, professional). Protective factors for this patient include: coping skills and hope for the future. Considering these factors, the overall suicide risk at this point appears to be moderate. Patient is not appropriate for outpatient follow up. ? ?[facility] ED from 04/05/2022 in [place] ED from 03/24/2022 in [place] DEPT ED from 08/27/2021 in [facility] DEPT  ?C-SSRS RISK CATEGORY No Risk No Risk No Risk  ? ?  ? ?Nafeesa Struve is a 46 year old female presenting under IVC to [facility] due to bizarre behaviors. Patient denied SI, HI, psychosis and alcohol/drug usage. When asked, why are you here, patient stated, "I don't know the cops made me come in, check with the judge". Patient answered questions at random with bizarre answers at times. When asked, how is your appetite, patient stated, "I eat out of the garbage or ask the man next door for food". When asked, do you have a family member that has committed suicide, "my ex-aunt told all my business in the streets". Patient denied being depressed. Patient denied prior suicide attempts and self-harming behaviors. Patient denied receiving any outpatient mental health services. Patient denied being on any psych medications. Patient was last inpatient 07/16/2022, 10/10/2020, 03/25/2020 and 03/31/2019 all at [facility]. ? ?PER TRIAGE NOTE 04/05/22: ?Pt brough to ED by [employer] with IVC paperwork in hand after pt noted to  have "bizarre behavior". IVC papers state that pt has thrown refrigerator and other appliances out of front door, has been hallucinating thinking that house manager is someone else and has been scaring neighbors with bizarre behavior behavior and yelling. Pt noted to be be speaking in different voices while in triage room, having conversations with no one present. Pt has hx of schizophrenia and is noncompliant with medications ? ? ? ?Chief Complaint:  ?Chief Complaint  ?Patient presents with  ? Psychiatric Evaluation  ? ?Visit Diagnosis:  ?Schizophrenia Unspecified  ? ? ?CCA Screening, Triage and Referral (STR) ? ?Patient Reported Information ?How did you hear about [place]? Self ? ?What Is the Reason for Your Visit/Call Today? Psychiatric evaluation ? ?How Long Has This Been Causing You Problems? -- Pincus Badder) ? ?What Do You Feel Would Help You the Most Today? Treatment for Depression or other mood problem ? ? ?Have You Recently Had Any Thoughts About Hurting Yourself? No ? ?Are You Planning to Commit Suicide/Harm Yourself At This time? No ? ? ?Have you Recently Had Thoughts About [place]? No ? ?Are You Planning to Harm Someone at This Time? No ? ?Explanation: No data recorded ? ?Have You Used Any Alcohol or Drugs in the Past 24 Hours? No ? ?How Long Ago Did You Use Drugs or Alcohol? No data recorded ?What Did You Use and How Much? No data recorded ? ?Do You Currently Have a Therapist/Psychiatrist? No ? ?Name of Therapist/Psychiatrist: Beverly Sessions ? ? ?Have You Been Recently Discharged From Any Office Practice or Programs? No ? ?Explanation of Discharge From Practice/Program: No data recorded ? ?  ?CCA Screening Triage Referral Assessment ?Type of Contact: Tele-Assessment ? ?Telemedicine Service Delivery:   ?Is this  Initial or Reassessment? Initial Assessment ? ?Date Telepsych consult ordered in [facility]:  04/05/22 ? ?Time Telepsych consult ordered in [facility]:  2005 ? ?Location of Assessment: [facility] ? ?Provider Location: [facility] Assessment Services ? ? ?Collateral Involvement: none reported ? ? ?Does Patient Have a Stage manager Guardian? No data recorded ?Name and Contact of Legal Guardian: No data recorded ?If Minor and Not Living with Parent(s), Who has Custody? No data recorded ?Is CPS involved or ever been involved? Never ? ?Is APS involved or ever been involved? Never ? ? ?Patient Determined To Be At Risk for Harm To Self or Others Based on Review of Patient Reported Information or Presenting Complaint? No ? ?Method: No data recorded ?Availability of Means: No data recorded ?Intent: No data recorded ?Notification Required: No data recorded ?Additional Information for Danger to Others Potential: No data recorded ?Additional Comments for Danger to Others Potential: No data recorded ?Are There Guns or Other Weapons in [place]? No data recorded ?Types of Guns/Weapons: No data recorded ?Are These Weapons Safely Secured?                            No data recorded ?Who Could Verify You Are Able To Have These Secured: No data recorded ?Do You Have any Outstanding Charges, Pending Court Dates, Parole/Probation? No data recorded ?Contacted To Inform of Risk of Harm To Self or Others: Other: Comment (NA) ? ? ? ?Does Patient Present under Involuntary Commitment? Yes ? ?IVC Papers Initial File Date: 04/05/22 ? ? ?[place] of Residence: Kathleen Argue ? ? ?Patient Currently Receiving the Following Services: Not Receiving Services ? ? ?Determination of Need: Emergent (2 hours) ? ? ?Options For Referral: Inpatient Hospitalization; Outpatient Therapy; Medication Management ? ? ? ? ?CCA Biopsychosocial ?Patient Reported Schizophrenia/Schizoaffective Diagnosis in Past: Yes ? ? ?Strengths: uta ? ? ?Mental Health Symptoms ?Depression:   ?None ?  ?Duration of Depressive symptoms:    ?Mania:   ?None ?  ?Anxiety:    ?None ?  ?Psychosis:   ?None ?  ?Duration of Psychotic symptoms:    ?Trauma:   ?None ?  ?Obsessions:   ?None ?  ?Compulsions:   ?None ?   ?Inattention:   ?None ?  ?Hyperactivity/Impulsivity:   ?N/A ?  ?Oppositional/Defiant Behaviors:   ?None ?  ?Emotional Irregularity:   ?None ?  ?Other Mood/Personality Symptoms:  No data recorded  ? ?Mental Status Exam ?Appearance and self-care  ?Stature:   ?Average (Pt laying in bed, UTA.) ?  ?Weight:   ?Average weight ?  ?Clothing:   ?Neat/clean ?  ?Grooming:   ?Normal ?  ?Cosmetic use:   ?None ?  ?Posture/gait:   ?Normal ?  ?Motor activity:   ?Not Remarkable ?  ?Sensorium  ?Attention:   ?Distractible ?  ?Concentration:   ?Preoccupied ?  ?Orientation:   ?X5 ?  ?Recall/memory:   ?Normal ?  ?Affect and Mood  ?Affect:   ?Appropriate ?  ?Mood:   ?Other (Comment) (Pleasant) ?  ?Relating  ?Eye contact:   ?Normal ?  ?Facial expression:  No data recorded  ?Attitude toward examiner:   ?Cooperative ?  ?Thought and Language  ?Speech flow:  ?Normal ?  ?Thought content:   ?Appropriate to Mood and Circumstances ?  ?Preoccupation:   ?None ?  ?Hallucinations:   ?None ?  ?Organization:  No data recorded  ?Executive Functions  ?Fund of Knowledge:   ?Average ?  ?Intelligence:   ?  Average ?  ?Abstraction:   ?Functional (UTA) ?  ?Judgement:   ?Impaired ?  ?Reality Testing:   ?-- (UTA) ?  ?Insight:   ?Lacking ?  ?Decision Making:   ?Impulsive ?  ?Social Functioning  ?Social Maturity:   ?Impulsive (UTA) ?  ?Social Judgement:   ?Heedless (UTA) ?  ?Stress  ?Stressors:   ?Other (Comment) ?  ?Coping Ability:   ?Deficient supports ?  ?Skill Deficits:   ?Decision making; Self-control ?  ?Supports:   ?Support needed ?  ? ? ?Religion: ?Religion/Spirituality ?Are You A Religious Person?: Yes ? ?Leisure/Recreation: ?Leisure / Recreation ?Do You Have Hobbies?: Yes ?Leisure and Hobbies: going to the gym and reading books ? ?Exercise/Diet: ?Exercise/Diet ?Do You Exercise?: No (Pt reported, she used to workout 4 days a week.) ?Do You Follow a Special Diet?: No ?Do You Have Any Trouble Sleeping?: No ? ? ?CCA Employment/Education ?Employment/Work  Situation: ?Employment / Work Situation ?Employment Situation: Unemployed ?Patient's Job has Been Impacted by Current Illness: Yes (Though pt denies) ?Has Patient ever Been in the Military?: No ? ?Education: ?E

## 2022-04-06 NOTE — ED Provider Notes (Signed)
Emergency Medicine Observation Re-evaluation Note ? ?Stephanie [name] is a 46 y.o. female, seen on rounds today.  Pt initially presented to the ED for complaints of Psychiatric Evaluation ?Currently, the patient is sleeping . ? ?Physical Exam  ?BP (!) 115/50 (BP Location: Right Leg)   Pulse (!) 57   Temp 98.1 ?F (36.7 ?C) (Oral)   Resp 14   Ht 5\' 1"  (1.549 m)   Wt 63.5 kg   SpO2 98%   BMI 26.45 kg/m?  ?Physical Exam ?General: sleeping ?Cardiac: bradycardia ?Lungs: clear ?Psych: sleeping ? ?ED Course / MDM  ?EKG:EKG Interpretation ? ?Date/Time:  Monday Apr 05 2022 19:37:42 EDT ?Ventricular Rate:  69 ?PR Interval:  132 ?QRS Duration: 86 ?QT Interval:  364 ?QTC Calculation: 390 ?R Axis:   88 ?Text Interpretation: Normal sinus rhythm with sinus arrhythmia Right atrial enlargement Nonspecific ST and T wave abnormality Abnormal ECG When compared with ECG of 05-Apr-2022 19:36, PREVIOUS ECG IS PRESENT Confirmed by 07-Apr-2022 [phone]) on 04/06/2022 12:08:22 AM ? ?I have reviewed the labs performed to date as well as medications administered while in observation.  Recent changes in the last 24 hours include none. ? ?Plan  ?Current plan is for waiting TTS evaluation. ?Stephanie [name] is under involuntary commitment. ?  ? ?  ?Richardean Sale, MD ?04/06/22 [phone] ? ?

## 2022-04-06 NOTE — ED Provider Notes (Addendum)
?[facility] EMERGENCY DEPARTMENT ?Provider Note ? ? ?CSN: [id number] ?Arrival date & time: 04/05/22  1911 ? ?  ? ?History ? ?Chief Complaint  ?Patient presents with  ? Psychiatric Evaluation  ? ? ?Stephanie [name] is a 46 y.o. female. ? ?46 year old female with prior medical history as detailed below presents for evaluation. ?Patient presents with IVC papers. ? ?IVC papers reports that the patient is diagnosed previously with schizophrenia.  Patient has not been taking her medications.  Patient has been aggressive and damaging to furniture.  Patient is apparently scaring her neighbors with bizarre aggressive behavior and loud yelling. ? ? ?The history is provided by the patient and medical records.  ? ?  ? ?Home Medications ?Prior to Admission medications   ?Medication Sig Start Date End Date Taking? Authorizing Provider  ?ARIPiprazole (ABILIFY) 20 MG tablet Take 1 tablet (20 mg total) by mouth daily for 7 days. ?Patient not taking: Reported on 04/05/2022 07/30/21 08/06/21  Park Pope, MD  ?hydrOXYzine (ATARAX) 25 MG tablet Take 1 tablet (25 mg total) by mouth every 6 (six) hours. ?Patient not taking: Reported on 04/05/2022 03/24/22   Tanda Rockers, PA-C  ?   ? ?Allergies    ?Haldol [haloperidol lactate], Penicillins, and Penicillins   ? ?Review of Systems   ?Review of Systems  ?All other systems reviewed and are negative. ? ?Physical Exam ?Updated Vital Signs ?BP 127/87 (BP Location: Left Arm)   Pulse 69   Temp 98.7 ?F (37.1 ?C) (Oral)   Resp 15   SpO2 100%  ?Physical Exam ?Vitals and nursing note reviewed.  ?Constitutional:   ?   General: She is not in acute distress. ?   Appearance: Normal appearance. She is well-developed.  ?HENT:  ?   Head: Normocephalic and atraumatic.  ?Eyes:  ?   Conjunctiva/sclera: Conjunctivae normal.  ?   Pupils: Pupils are equal, round, and reactive to light.  ?Cardiovascular:  ?   Rate and Rhythm: Normal rate and regular rhythm.  ?   Heart sounds: Normal heart sounds.   ?Pulmonary:  ?   Effort: Pulmonary effort is normal. No respiratory distress.  ?   Breath sounds: Normal breath sounds.  ?Abdominal:  ?   General: There is no distension.  ?   Palpations: Abdomen is soft.  ?   Tenderness: There is no abdominal tenderness.  ?Musculoskeletal:     ?   General: No deformity. Normal range of motion.  ?   Cervical back: Normal range of motion and neck supple.  ?Skin: ?   General: Skin is warm and dry.  ?Neurological:  ?   General: No focal deficit present.  ?   Mental Status: She is alert and oriented to person, place, and time.  ? ? ?ED Results / Procedures / Treatments   ?Labs ?(all labs ordered are listed, but only abnormal results are displayed) ?Labs Reviewed  ?COMPREHENSIVE METABOLIC PANEL - Abnormal; Notable for the following components:  ?    Result Value  ? Potassium 3.4 (*)   ? Glucose, Bld 58 (*)   ? Anion gap 16 (*)   ? All other components within normal limits  ?CBC WITH DIFFERENTIAL/PLATELET - Abnormal; Notable for the following components:  ? Hemoglobin 11.6 (*)   ? All other components within normal limits  ?ACETAMINOPHEN LEVEL - Abnormal; Notable for the following components:  ? Acetaminophen (Tylenol), Serum <10 (*)   ? All other components within normal limits  ?SALICYLATE LEVEL - Abnormal;  Notable for the following components:  ? Salicylate Lvl <7.0 (*)   ? All other components within normal limits  ?ETHANOL  ?RAPID URINE DRUG SCREEN, HOSP PERFORMED  ?I-STAT BETA HCG BLOOD, ED ([facility], [facility], [facility] ONLY)  ? ? ?EKG ?None ? ?Radiology ?No results found. ? ?Procedures ?Procedures  ? ? ?Medications Ordered in ED ?Medications - No data to display ? ?ED Course/ Medical Decision Making/ A&P ?  ?                        ?Medical Decision Making ? ? ?Medical Screen Complete ? ?This patient presented to the ED with complaint of mental health evaluation. ? ?This complaint involves an extensive number of treatment options. The initial differential diagnosis includes, but is not limited to,  psychosis ? ?This presentation is: Acute, Chronic, Self-Limited, Previously Undiagnosed, Uncertain Prognosis, Complicated, Systemic Symptoms, and Threat to Life/Bodily Function ? ?Patient with history of schizophrenia ? ?She arrives on IVC hold. ? ?Patient with aggressive and bizarre behavior. ? ?Patient apparently is noncompliant with medications. ? ?Patient will require psychiatric evaluation and plan for treatment ? ?She is medically clear at this time for evaluation by psychiatry.   ? ?Additional history obtained: ? ?External records from outside sources obtained and reviewed including prior ED visits and prior Inpatient records.  ? ? ?Lab Tests: ? ?I ordered and personally interpreted labs.  The pertinent results include: CBC CMP, salicylate, acetaminophen, ethanol ? ? ?Problem List / ED Course: ? ?Psychosis ? ? ?Reevaluation: ? ?After the interventions noted above, I reevaluated the patient and found that they have: stayed the same ? ? ? ?Disposition: ? ?After consideration of the diagnostic results and the patients response to treatment, I feel that the patent would benefit from psychiatric evaluation and treatment plan.  ? ? ? ? ? ? ? ? ?Final Clinical Impression(s) / ED Diagnoses ?Final diagnoses:  ?Psychosis, unspecified psychosis type (HCC)  ? ? ?Rx / DC Orders ?ED Discharge Orders   ? ? None  ? ?  ? ? ?  ?Wynetta Fines, MD ?04/06/22 0008 ? ?  ?Wynetta Fines, MD ?04/06/22 0009 ? ?

## 2022-04-07 ENCOUNTER — Inpatient Hospital Stay (HOSPITAL_COMMUNITY)
Admission: AD | Admit: 2022-04-07 | Discharge: 2022-04-16 | DRG: 885 | Disposition: A | Discharge status: Home / Self Care | Payer: Federal, State, Local not specified - Other | Source: Intra-hospital | Attending: Psychiatry | Admitting: Psychiatry

## 2022-04-07 DIAGNOSIS — R112 Nausea with vomiting, unspecified: Secondary | ICD-10-CM | POA: Diagnosis not present

## 2022-04-07 DIAGNOSIS — F515 Nightmare disorder: Secondary | ICD-10-CM | POA: Diagnosis present

## 2022-04-07 DIAGNOSIS — Z880 Allergy status to penicillin: Secondary | ICD-10-CM

## 2022-04-07 DIAGNOSIS — Z91148 Patient's other noncompliance with medication regimen for other reason: Secondary | ICD-10-CM

## 2022-04-07 DIAGNOSIS — Z91419 Personal history of unspecified adult abuse: Secondary | ICD-10-CM

## 2022-04-07 DIAGNOSIS — Z91410 Personal history of adult physical and sexual abuse: Secondary | ICD-10-CM | POA: Diagnosis not present

## 2022-04-07 DIAGNOSIS — F200 Paranoid schizophrenia: Principal | ICD-10-CM | POA: Diagnosis present

## 2022-04-07 DIAGNOSIS — F319 Bipolar disorder, unspecified: Secondary | ICD-10-CM | POA: Diagnosis present

## 2022-04-07 DIAGNOSIS — Z888 Allergy status to other drugs, medicaments and biological substances status: Secondary | ICD-10-CM | POA: Diagnosis not present

## 2022-04-07 DIAGNOSIS — F201 Disorganized schizophrenia: Secondary | ICD-10-CM | POA: Diagnosis not present

## 2022-04-07 DIAGNOSIS — F209 Schizophrenia, unspecified: Principal | ICD-10-CM | POA: Diagnosis present

## 2022-04-07 LAB — RESP PANEL BY RT-PCR (FLU A&B, COVID) ARPGX2
Influenza A by PCR: NEGATIVE
Influenza B by PCR: NEGATIVE
SARS Coronavirus 2 by RT PCR: NEGATIVE

## 2022-04-07 MED ORDER — MAGNESIUM HYDROXIDE 400 MG/5ML PO SUSP: 30.0000 mL | Freq: Every day | ORAL | Status: DC | PRN

## 2022-04-07 MED ORDER — ARIPIPRAZOLE 10 MG PO TABS
10.0000 mg | ORAL_TABLET | Freq: Every day | ORAL | Status: DC
  Filled 2022-04-07 (×3): qty 1

## 2022-04-07 MED ORDER — DIPHENHYDRAMINE HCL 50 MG/ML IJ SOLN: 25.0000 mg | Freq: Two times a day (BID) | INTRAMUSCULAR | Status: DC | PRN

## 2022-04-07 MED ORDER — TRAZODONE HCL 50 MG PO TABS
50.0000 mg | ORAL_TABLET | Freq: Every day | ORAL | Status: DC
  Administered 2022-04-08 – 2022-04-15 (×8): 50 mg via ORAL
  Filled 2022-04-07 (×12): qty 1

## 2022-04-07 MED ORDER — ZIPRASIDONE MESYLATE 20 MG IM SOLR: 20.0000 mg | Freq: Two times a day (BID) | INTRAMUSCULAR | Status: DC | PRN

## 2022-04-07 MED ORDER — OLANZAPINE 5 MG PO TABS
5.0000 mg | ORAL_TABLET | Freq: Every evening | ORAL | Status: DC | PRN
  Administered 2022-04-08: 5 mg via ORAL
  Filled 2022-04-07 (×2): qty 1

## 2022-04-07 MED ORDER — LORAZEPAM 2 MG/ML IJ SOLN: 1.0000 mg | Freq: Two times a day (BID) | INTRAMUSCULAR | Status: DC | PRN

## 2022-04-07 MED ORDER — ACETAMINOPHEN 325 MG PO TABS
650.0000 mg | ORAL_TABLET | Freq: Four times a day (QID) | ORAL | Status: DC | PRN
  Administered 2022-04-09 – 2022-04-12 (×2): 650 mg via ORAL
  Filled 2022-04-07 (×2): qty 2

## 2022-04-07 MED ORDER — HYDROXYZINE HCL 25 MG PO TABS
25.0000 mg | ORAL_TABLET | Freq: Four times a day (QID) | ORAL | Status: DC
  Administered 2022-04-08 – 2022-04-16 (×26): 25 mg via ORAL
  Filled 2022-04-07 (×43): qty 1

## 2022-04-07 MED ORDER — ALUM & MAG HYDROXIDE-SIMETH 200-200-20 MG/5ML PO SUSP: 30.0000 mL | ORAL | Status: DC | PRN

## 2022-04-07 NOTE — ED Notes (Signed)
Report given to [facility] RN at [facility] ?

## 2022-04-07 NOTE — ED Notes (Signed)
[facility] called for transport to [facility] ?

## 2022-04-07 NOTE — ED Provider Notes (Signed)
Emergency Medicine Observation Re-evaluation Note ? ?Stephanie [name] is a 46 y.o. female, seen on rounds today.  Pt initially presented to the ED for complaints of Psychiatric Evaluation ?Currently, the patient is watching TV. ? ?Physical Exam  ?BP 139/67 (BP Location: Right Arm)   Pulse 62   Temp 98.4 ?F (36.9 ?C) (Oral)   Resp 17   Ht 5\' 1"  (1.549 m)   Wt 63.5 kg   SpO2 100%   BMI 26.45 kg/m?  ?Physical Exam ?General: nad ?Cardiac: rrr ?Lungs: clear ?Psych: bizzarre behaviors ? ?ED Course / MDM  ?EKG:EKG Interpretation ? ?Date/Time:  Monday Apr 05 2022 19:37:42 EDT ?Ventricular Rate:  69 ?PR Interval:  132 ?QRS Duration: 86 ?QT Interval:  364 ?QTC Calculation: 390 ?R Axis:   88 ?Text Interpretation: Normal sinus rhythm with sinus arrhythmia Right atrial enlargement Nonspecific ST and T wave abnormality Abnormal ECG When compared with ECG of 05-Apr-2022 19:36, PREVIOUS ECG IS PRESENT Confirmed by Stephanie [name] [phone]) on 04/06/2022 12:08:22 AM ? ?I have reviewed the labs performed to date as well as medications administered while in observation.  Recent changes in the last 24 hours include none. ? ?Plan  ?Current plan is for pt is medically clear waiting for psych placement ?Stephanie [name] is under involuntary commitment. ?  ? ?  ?Blanchie Dessert, MD ?04/07/22 1106 ? ?

## 2022-04-07 NOTE — ED Notes (Signed)
Breakfast order placed ?

## 2022-04-07 NOTE — ED Notes (Signed)
Pt talking to herself in her room, she'll stand in the doorway and then walk around her room. Pt walked to the bathroom, did not go in, stood at the door for a moment and then walked back to her room. ?

## 2022-04-07 NOTE — Progress Notes (Signed)
Patient has been denied by [facility] due to no appropriate beds available. Patient meets [facility] inpatient criteria per Earleen Newport, NP. Patient has been faxed out to the following facilities:  ? ?[facility]  [place], [place] [address] [phone]  ?[place]  [address] [place] [address] [phone]  ?[facility]  [address] [place] [address] [phone]  ?[place]  [address] [place] [address] [id number]  ?[place] [place] [address] [phone]  ?[facility] [address] [place] [address] [phone]  ?[facility]  [place], [place] [address] [phone]  ?[facility]  [place], [place] [address] [phone]  ?[facility]  [address] [place] [address] [phone]  ?[place] [address] [place] [address] [phone]  ?[facility]  [address] [place] [address] [phone]  ?[facility]  [address] [place] [address] [phone]  ?[facility]  [address] [place] [address] [phone]  ? ?Mariea Clonts, MSW, LCSW-A  ?11:21 AM 04/07/2022   ?

## 2022-04-07 NOTE — ED Notes (Signed)
PT ambulated to [facility] independent. ?

## 2022-04-08 ENCOUNTER — Encounter (HOSPITAL_COMMUNITY): Payer: Self-pay | Admitting: Psychiatry

## 2022-04-08 ENCOUNTER — Other Ambulatory Visit: Payer: Self-pay

## 2022-04-08 DIAGNOSIS — F201 Disorganized schizophrenia: Secondary | ICD-10-CM

## 2022-04-08 MED ORDER — ARIPIPRAZOLE 15 MG PO TABS
15.0000 mg | ORAL_TABLET | Freq: Every day | ORAL | Status: DC
  Administered 2022-04-08 – 2022-04-13 (×6): 15 mg via ORAL
  Filled 2022-04-08 (×7): qty 1

## 2022-04-08 MED ORDER — ENSURE ENLIVE PO LIQD
237.0000 mL | Freq: Two times a day (BID) | ORAL | Status: DC
  Administered 2022-04-08 – 2022-04-10 (×5): 237 mL via ORAL
  Filled 2022-04-08 (×8): qty 237

## 2022-04-08 NOTE — Plan of Care (Signed)
  Problem: Education: Goal: Knowledge of [facility] Education information/materials will improve Outcome: Not Progressing Goal: Emotional status will improve Outcome: Not Progressing Goal: Mental status will improve Outcome: Not Progressing   

## 2022-04-08 NOTE — Tx Team (Signed)
Initial Treatment Plan 04/08/2022 12:32 AM KATHEINE LEVELL [id number]    PATIENT STRESSORS: Other: Pt states, "A lot of problems in [place], a potpourri of problems"     PATIENT STRENGTHS: Average or above average intelligence  Capable of independent living  General fund of knowledge    PATIENT IDENTIFIED PROBLEMS: Psychosis  Anxiety      "Help to be more outspoken"  "Help with speaking up for myself"           DISCHARGE CRITERIA:  Ability to meet basic life and health needs Improved stabilization in mood, thinking, and/or behavior Motivation to continue treatment in a less acute level of care  PRELIMINARY DISCHARGE PLAN: Outpatient therapy Return to previous living arrangement  PATIENT/FAMILY INVOLVEMENT: This treatment plan has been presented to and reviewed with the patient, Blima Huitt Liebert.  The patient and family have been given the opportunity to ask questions and make suggestions.  Maryellen Pile, RN 04/08/2022, 12:32 AM

## 2022-04-08 NOTE — Progress Notes (Signed)
Pt was encouraged but didn't attend orientation/goals group. ?

## 2022-04-08 NOTE — [facility] Group Notes (Signed)
PT was notified but did not attend group. PT came in after group and said she had a good day.

## 2022-04-08 NOTE — [facility] Suicide Risk Assessment (Signed)
[facility] INPATIENT:  Family/Significant Other Suicide Prevention Education  Suicide Prevention Education:  Patient Refusal for Family/Significant Other Suicide Prevention Education: The patient Stephanie [name] has refused to provide written consent for family/significant other to be provided Family/Significant Other Suicide Prevention Education during admission and/or prior to discharge.  Physician notified.   SPE completed with patient, as patient refused to consent to family contact. SPI pamphlet provided to pt and pt was encouraged to share information with support network, ask questions, and talk about any concerns relating to SPE. Patient denies access to guns/firearms and verbalized understanding of information provided. Mobile Crisis information also provided to patient.   Shields Pautz A Rica Heather 04/08/2022, 9:49 AM
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
## 2022-04-08 NOTE — Progress Notes (Signed)
Pt denies SI/HI/AVH and verbally agrees to approach staff if these become apparent or before harming themselves/others. Rates depression 0/10. Rates anxiety 0/10. Rates pain 0/10. Pt has been in her room for the majority of the day. Pt has been calm and cooperative. Pt has not been seen responding or having bizarre behavior. Pt did have an outburst this morning towards night staff. Pt became very paranoid when she went down to breakfast and when one of the MHTs was bringing her back, she was yelling and cussing at him. Pt has been fine on the unit otherwise and was put on the UR. Scheduled medications administered to pt, per MD orders. RN provided support and encouragement to pt. Q15 min safety checks implemented and continued. Pt safe on the unit. RN will continue to monitor and intervene as needed.   04/08/22 1200  Psych Admission Type (Psych Patients Only)  Admission Status Involuntary  Psychosocial Assessment  Patient Complaints None  Eye Contact Fair  Facial Expression Flat  Affect Appropriate to circumstance  Speech Logical/coherent;Soft  Interaction Assertive  Motor Activity Other (Comment) (WDL)  Appearance/Hygiene Poor hygiene  Behavior Characteristics Cooperative;Appropriate to situation;Calm  Mood Pleasant  Thought Process  Coherency Disorganized  Content Blaming others;Paranoia  Delusions None reported or observed  Perception Hallucinations  Hallucination Auditory  Judgment Impaired  Confusion None  Danger to Self  Current suicidal ideation? Denies  Danger to Others  Danger to Others None reported or observed
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
## 2022-04-08 NOTE — Progress Notes (Signed)
  Initial Admission Note:  Stephanie [name] is a 46 year old female being admitted to [facility] involuntarily.  According to previous notes, the pt was presented under IVC to [facility] due to bizarre behaviors.  In report, Clinical research associate was informed that prior to IVC, pt had been yelling at neighbors, damaging appliances, and observed speaking in different voices.  Pt had been non -med compliant.  On admit pt is calm and cooperative.  Pt denies  SI/HI, and verbally contracts for safety.  Pt denies anxiety and depression.  Pt endorses Auditory Hallucinations reporting that she hears "multiple voices."  Pt denies VH and is in no pain.  Pt reports she had a recent fall within the last 2 weeks, in which she bumped her head.  Pt reports she did not see a doctor after the fall.  Pt has been designated as a high fall risk.  Pt's UDS, Covid, and Alcohol screen were each negative.  Pt triggers are as follows:  "people that push your buttons", "people who don't know when to quit."  Pt describes her recent stressors as "a lot of problems in [place], a potpourri of problems."  A Skin search was conducted.  No contraband was found.  Pt is noted to have poor personal hygiene and is currently in scrubs.  Pt was advised of unit rules and expectations.  Pt was oriented to unit and staff Pt was offered nourishment and fluids for hydration.  Pt is safe on the unit with Q 15 safety checks.

## 2022-04-08 NOTE — Group Note (Signed)
Recreation Therapy Group Note   Group Topic:Stress Management  Group Date: 04/08/2022 Start Time: 1000 End Time: 1030 Facilitators: Caroll Rancher, LRT,CTRS Location: 500 Hall Dayroom   Goal Area(s) Addresses:  Patient will identify positive stress management techniques. Patient will identify benefits of using stress management post d/c.  Group Description:  Meditation.  LRT and patients discussed meditation and what it's used for.  LRT then played a meditation that focused on building self belief and self confidence.  Patients were to listen and follow along as meditation played to gain the full range of what the meditation had to offer.  LRT expressed to patients they can find various stress management techniques from apps, Internet, Youtube, etc.   Affect/Mood: N/A   Participation Level: Did not attend    Clinical Observations/Individualized Feedback:     Plan: Continue to engage patient in RT group sessions 2-3x/week.   Caroll Rancher, LRT,CTRS 04/08/2022 11:18 AM
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
## 2022-04-08 NOTE — Progress Notes (Addendum)
     04/07/22 2245  Psych Admission Type (Psych Patients Only)  Admission Status Involuntary  Psychosocial Assessment  Patient Complaints Other (Comment) (Tired)  Eye Contact Fair  Facial Expression Flat  Affect Appropriate to circumstance  Speech Soft  Interaction Assertive  Motor Activity Other (Comment) (WDL)  Appearance/Hygiene In scrubs;Poor hygiene  Behavior Characteristics Cooperative;Appropriate to situation  Mood Pleasant  Thought Process  Coherency Disorganized  Content Paranoia;Blaming others  Delusions None reported or observed  Perception Hallucinations  Hallucination Auditory ("multiple voices")  Judgment Impaired  Confusion Mild  Danger to Self  Current suicidal ideation? Denies  Danger to Others  Danger to Others None reported or observed

## 2022-04-08 NOTE — Progress Notes (Signed)
NUTRITION ASSESSMENT  Pt identified as at risk on the Malnutrition Screen Tool  INTERVENTION: 1. Supplements: Ensure Plus High Protein po BID, each supplement provides 350 kcal and 20 grams of protein.   NUTRITION DIAGNOSIS: Unintentional weight loss related to sub-optimal intake as evidenced by pt report.   Goal: Pt to meet >/= 90% of their estimated nutrition needs.  Monitor:  PO intake  Assessment:  Pt admitted for aggressive behavior.  Per weight records, pt has lost 39 lbs since 08/27/21 (26% wt loss x 7 months, significant for time frame).  Will order Ensure supplements.  Height: Ht Readings from Last 1 Encounters:  04/07/22 5\' 1"  (1.549 m)    Weight: Wt Readings from Last 1 Encounters:  04/07/22 48.6 kg    Weight Hx: Wt Readings from Last 10 Encounters:  04/07/22 48.6 kg  04/06/22 63.5 kg  08/27/21 66.7 kg  07/31/21 65.8 kg  07/17/21 58.1 kg  05/05/21 54.8 kg  03/31/21 68 kg  10/24/20 71.7 kg  10/10/20 71.7 kg  04/14/20 64.4 kg    BMI:  Body mass index is 20.24 kg/m. Pt meets criteria for normal based on current BMI.  Estimated Nutritional Needs: Kcal: 25-30 kcal/kg Protein: > 1 gram protein/kg Fluid: 1 ml/kcal  Diet Order:  Diet Order             Diet regular Room service appropriate? Yes; Fluid consistency: Thin  Diet effective now                  Pt is also offered choice of unit snacks mid-morning and mid-afternoon.  Pt is eating as desired.   Lab results and medications reviewed.   04/16/20, MS, RD, LDN Inpatient Clinical Dietitian Contact information available via Amion

## 2022-04-09 ENCOUNTER — Encounter (HOSPITAL_COMMUNITY): Payer: Self-pay

## 2022-04-09 DIAGNOSIS — F201 Disorganized schizophrenia: Secondary | ICD-10-CM | POA: Diagnosis not present

## 2022-04-09 LAB — BASIC METABOLIC PANEL
Anion gap: 4 — ABNORMAL LOW (ref 5–15)
BUN: 19 mg/dL (ref 6–20)
CO2: 28 mmol/L (ref 22–32)
Calcium: 8.6 mg/dL — ABNORMAL LOW (ref 8.9–10.3)
Chloride: 107 mmol/L (ref 98–111)
Creatinine, Ser: 0.81 mg/dL (ref 0.44–1.00)
GFR, Estimated: >60 mL/min (ref >60)
Glucose, Bld: 86 mg/dL (ref 70–99)
Potassium: 3.9 mmol/L (ref 3.5–5.1)
Sodium: 139 mmol/L (ref 135–145)

## 2022-04-09 MED ORDER — ARIPIPRAZOLE ER 400 MG IM SRER
400.0000 mg | INTRAMUSCULAR | Status: DC
  Administered 2022-04-09: 400 mg via INTRAMUSCULAR
  Filled 2022-04-09: qty 2

## 2022-04-09 NOTE — Progress Notes (Signed)
[facility]  04/09/2022 4:28 PM Stephanie CanalesOwetta D Hatlestad  MRN:  [id number]  Subjective:   Stephanie [name] is a 46 y.o. female with a reported psychiatric history of schizophrenia and bipolar disorder, who [facility] after presenting vial law enforcement under IVC with complaints that patient has been taking her medications, aggressive behavior, damaging, furniture, and scaring her neighbors with bizarre aggressive behavior.   On assessment today, patient denies having any paranoia and does not appear to have any delusional thought construct.  Reports her mood is euthymic.  Reports sleeping well last night.  Reports eating well.  Per nursing, no concerns of behavior outbursts.  Denies having side effects from medications. Denies SI.  Denies HI.  Denies AH, VH, thought insertion and thought control.  Denies ideas of reference. Patient is agreeable with restarting Abilify maintained today.     Principal Problem: Schizophrenia (HCC) Diagnosis: Principal Problem:   Schizophrenia (HCC)  Total Time spent with patient: 20 minutes  Past Psychiatric History:  Diagnosis: Reports history of being diagnosed with schizophrenia and bipolar in the past.  She also reports "I may have been diagnosed as autistic but I do not know" Patient reports history of multiple psychiatric hospitalizations in the past Denies history of suicide attempts Current medications: None.  Reports that she has not received Abilify injection in many months. Psychiatric medication history: Seroquel, Geodon, Risperdal, Abilify, Lunesta, Ambien, Zyprexa, Haldol (caused throat to swell)  Past Medical History:  Past Medical History:  Diagnosis Date   Schizophrenia (HCC)     Past Surgical History:  Procedure Laterality Date   INDUCED ABORTION     Family History:  Family History  Problem Relation Age of Onset   Cancer Father    Family Psychiatric  History:  Psychiatric: Denies History of suicide attempt:  Maternal cousin   Social History:  Social History   Substance and Sexual Activity  Alcohol Use Yes   Comment: occassional     Social History   Substance and Sexual Activity  Drug Use Never    Social History   Socioeconomic History   Marital status: Unknown    Spouse name: Not on file   Number of children: Not on file   Years of education: Not on file   Highest education level: Not on file  Occupational History   Not on file  Tobacco Use   Smoking status: Never    Passive exposure: Never   Smokeless tobacco: Never  Vaping Use   Vaping Use: Former  Substance and Sexual Activity   Alcohol use: Yes    Comment: occassional   Drug use: Never   Sexual activity: Never  Other Topics Concern   Not on file  Social History Narrative   ** Merged History Encounter **       Social Determinants of Health   Financial Resource Strain: Not on file  Food Insecurity: Not on file  Transportation Needs: Not on file  Physical Activity: Not on file  Stress: Not on file  Social Connections: Not on file   Additional Social History:                         Sleep: Fair  Appetite:  Fair  Current Medications: Current Facility-Administered Medications  Medication Dose Route Frequency Provider Last Rate Last Admin   acetaminophen (TYLENOL) tablet 650 mg  650 mg Oral Q6H PRN Phineas InchesMassengill, Alonte Wulff, MD  alum & mag hydroxide-simeth (MAALOX/MYLANTA) 200-200-20 MG/5ML suspension 30 mL  30 mL Oral Q4H PRN Garvis Downum, Harrold Donath, MD       ARIPiprazole (ABILIFY) tablet 15 mg  15 mg Oral Daily Chelbie Jarnagin, Harrold Donath, MD   15 mg at 04/09/22 0758   ARIPiprazole ER (ABILIFY MAINTENA) injection 400 mg  400 mg Intramuscular Q28 days Kayonna Lawniczak, Harrold Donath, MD   400 mg at 04/09/22 1250   ziprasidone (GEODON) injection 20 mg  20 mg Intramuscular BID PRN Jamesyn Lindell, Harrold Donath, MD       And   LORazepam (ATIVAN) injection 1 mg  1 mg Intramuscular BID PRN Zyra Parrillo, Harrold Donath, MD       And    diphenhydrAMINE (BENADRYL) injection 25 mg  25 mg Intramuscular BID PRN Harvey Matlack, Harrold Donath, MD       feeding supplement (ENSURE ENLIVE / ENSURE PLUS) liquid 237 mL  237 mL Oral BID BM Meria Crilly, Harrold Donath, MD   237 mL at 04/09/22 1416   hydrOXYzine (ATARAX) tablet 25 mg  25 mg Oral Q6H Eloina Ergle, Harrold Donath, MD   25 mg at 04/09/22 1502   magnesium hydroxide (MILK OF MAGNESIA) suspension 30 mL  30 mL Oral Daily PRN Al Gagen, Harrold Donath, MD       traZODone (DESYREL) tablet 50 mg  50 mg Oral QHS Calynn Ferrero, Harrold Donath, MD   50 mg at 04/08/22 2113    Lab Results:  Results for orders placed or performed during the hospital encounter of 04/07/22 (from the past 48 hour(s))  Basic metabolic panel     Status: Abnormal   Collection Time: 04/09/22  6:30 AM  Result Value Ref Range   Sodium 139 135 - 145 mmol/L   Potassium 3.9 3.5 - 5.1 mmol/L   Chloride 107 98 - 111 mmol/L   CO2 28 22 - 32 mmol/L   Glucose, Bld 86 70 - 99 mg/dL    Comment: Glucose reference range applies only to samples taken after fasting for at least 8 hours.   BUN 19 6 - 20 mg/dL   Creatinine, Ser 4.33 0.44 - 1.00 mg/dL   Calcium 8.6 (L) 8.9 - 10.3 mg/dL   GFR, Estimated >29 >51 mL/min    Comment: (NOTE) Calculated using the CKD-EPI Creatinine Equation (2021)    Anion gap 4 (L) 5 - 15    Comment: Performed at [facility], [address], [place], [place] [address]    Blood Alcohol level:  Lab Results  Component Value Date   ETH <10 04/05/2022   ETH <10 07/31/2021    Metabolic Disorder Labs: Lab Results  Component Value Date   HGBA1C 5.4 07/20/2021   MPG 108.28 07/20/2021   MPG 128 05/02/2021   No results found for: PROLACTIN Lab Results  Component Value Date   CHOL 224 (H) 07/17/2021   TRIG 364 (H) 07/17/2021   HDL 48 07/17/2021   CHOLHDL 4.7 07/17/2021   VLDL 73 (H) 07/17/2021   LDLCALC 103 (H) 07/17/2021   LDLCALC 58 10/15/2020    Physical Findings: AIMS: Facial and Oral Movements Muscles of  Facial Expression: None, normal Lips and Perioral Area: None, normal Jaw: None, normal Tongue: None, normal,Extremity Movements Upper (arms, wrists, hands, fingers): None, normal Lower (legs, knees, ankles, toes): None, normal, Trunk Movements Neck, shoulders, hips: None, normal, Overall Severity Severity of abnormal movements (highest score from questions above): None, normal Incapacitation due to abnormal movements: None, normal Patient's awareness of abnormal movements (rate only patient's report): No Awareness, Dental Status Current problems with teeth  and/or dentures?: No Does patient usually wear dentures?: No  CIWA:  CIWA-Ar Total: 4 COWS:     Musculoskeletal: Strength & Muscle Tone: within normal limits Gait & Station: normal Patient leans: N/A  Psychiatric Specialty Exam:  Presentation  General Appearance: Casual  Eye Contact:Good  Speech:Normal Rate  Speech Volume:Normal  Handedness:Right   Mood and Affect  Mood:Anxious; Euthymic  Affect:Full Range   Thought Process  Thought Processes:Linear  Descriptions of Associations:Intact  Orientation:Partial  Thought Content:Logical  History of Schizophrenia/Schizoaffective disorder:Yes  Duration of Psychotic Symptoms:Greater than six months  Hallucinations:Hallucinations: None Description of Visual Hallucinations: a very tall witch  Ideas of Reference:None  Suicidal Thoughts:Suicidal Thoughts: No  Homicidal Thoughts:Homicidal Thoughts: No   Sensorium  Memory:Immediate Good; Recent Good; Remote Good  Judgment:Fair  Insight:Fair   Executive Functions  Concentration:Fair  Attention Span:Fair  Recall:Fair  Fund of Knowledge:Fair  Language:Fair   Psychomotor Activity  Psychomotor Activity:Psychomotor Activity: Normal   Assets  Assets:Communication Skills; Desire for Improvement; Housing   Sleep  Sleep:Sleep: Good    Physical Exam: Physical Exam Vitals reviewed.   Constitutional:      General: She is not in acute distress. Pulmonary:     Effort: Pulmonary effort is normal.  Neurological:     Mental Status: She is alert.     Motor: No weakness.     Gait: Gait normal.   Review of Systems  Psychiatric/Behavioral:  Negative for depression, hallucinations and suicidal ideas. The patient is nervous/anxious. The patient does not have insomnia.    Blood pressure 106/64, pulse 72, temperature 98.1 F (36.7 C), temperature source Oral, resp. rate 16, height 5\' 1"  (1.549 m), weight 48.6 kg, SpO2 100 %. Body mass index is 20.24 kg/m.   Treatment Plan Summary: Daily contact with patient to assess and evaluate symptoms and progress in treatment   ASSESSMENT:   Diagnoses / Active Problems: Schizophrenia - paranoid type    PLAN: Safety and Monitoring:             -- Involuntary admission to inpatient psychiatric unit for safety, stabilization and treatment             -- Daily contact with patient to assess and evaluate symptoms and progress in treatment             -- Patient's case to be discussed in multi-disciplinary team meeting             -- Observation Level : q15 minute checks             -- Vital signs:  q12 hours             -- Precautions: suicide, elopement, and assault   2. Psychiatric Diagnoses and Treatment:               -Continue abilify 15 mg once daily for schizophrenia -Order Abilify Maintena LAI 400 mg IM toda 5-19. Continue PO abilify for 14 days after 5-19.     --  The risks/benefits/side-effects/alternatives to this medication were discussed in detail with the patient and time was given for questions. The patient consents to medication trial.                -- Metabolic profile and EKG monitoring obtained while on an atypical antipsychotic (BMI: Lipid Panel: HbgA1c: QTc:)              -- Encouraged patient to participate in unit milieu and in scheduled group therapies  3. Medical Issues Being  Addressed:                   4. Discharge Planning:              -- Social work and case management to assist with discharge planning and identification of hospital follow-up needs prior to discharge             -- Estimated LOS: 5-7 days             -- Discharge Concerns: Need to establish a safety plan; Medication compliance and effectiveness             -- Discharge Goals: Return home with outpatient referrals for mental health follow-up including medication management/psychotherapy     Cristy Hilts, MD 04/09/2022, 4:28 PM  Total Time Spent in Direct Patient Care:  I personally spent 30 minutes on the unit in direct patient care. The direct patient care time included face-to-face time with the patient, reviewing the patient's chart, communicating with other professionals, and coordinating care. Greater than 50% of this time was spent in counseling or coordinating care with the patient regarding goals of hospitalization, psycho-education, and discharge planning needs.   Phineas Inches, MD Psychiatrist

## 2022-04-09 NOTE — [facility] Group Notes (Signed)
[facility] Group Notes:  (Nursing/MHT/Case Management/Adjunct)  Date:  04/09/2022  Time:  10:44 AM  Type of Therapy:   Orientation/Goals group  Participation Level:  Minimal  Participation Quality:  Appropriate  Affect:  Flat  Cognitive:  Appropriate  Insight:  Improving  Engagement in Group:  Engaged and Improving  Modes of Intervention:  Discussion, Education, Orientation, and Support  Summary of Progress/Problems: Pt goal for today is to do what she needs to do in order to go home.   Stephanie [name] J Baylin Cabal 04/09/2022, 10:44 AM

## 2022-04-09 NOTE — Progress Notes (Signed)
Adult Psychoeducational Group Note  Date:  04/09/2022 Time:  8:14 PM  Group Topic/Focus:  Wrap-Up Group:   The focus of this group is to help patients review their daily goal of treatment and discuss progress on daily workbooks.  Participation Level:  Minimal  Participation Quality:  Appropriate  Affect:  Appropriate  Cognitive:  Appropriate  Insight: Appropriate  Engagement in Group:  Engaged  Modes of Intervention:  Education and Exploration  Additional Comments:  Patient attended and participated in group tonight. She reports that today  one thing she learnt that she could take with her whenever she leaves is tho show respect to  everyone.  Lita Mains [facility] 04/09/2022, 8:14 PM

## 2022-04-09 NOTE — Group Note (Signed)
Recreation Therapy Group Note   Group Topic:Personal Development  Group Date: 04/09/2022 Start Time: 1000 End Time: 1030 Facilitators: Caroll Rancher, LRT,CTRS Location: [address] Dayroom   Goal Area(s) Addresses:   Patient will successfully identify benefit of journaling. Patient will successfully identify things that motivate them when journaling.    Group Description:  Journaling.  LRT and patients discussed the purpose and importance of journaling.  LRT and patients discussed how journaling can be used to express thoughts, tracks patterns, clarifies needs, etc.  Patients were then given a worksheet to answer questions about various aspects dealt with in life.  Patients would then share with group what they were able to come up with.   Affect/Mood: Appropriate   Participation Level: Engaged   Participation Quality: Independent   Behavior: Appropriate   Speech/Thought Process: Focused   Insight: Good   Judgement: Good   Modes of Intervention: Writing   Patient Response to Interventions:  Engaged   Education Outcome:  Acknowledges education and In group clarification offered    Clinical Observations/Individualized Feedback: Pt was bright and engaged during group.  Pt expressed she journals at home.  Pt also expressed journaling helps to get out her emotions and thoughts.  Pt identified the words she lives by as "God is good".  Pt explained "I believe in God and when I feel down, he's always there for me".  When in physical or emotional pain, pt stated "I lie down in a quiet place or go for a walk.  Pt expressed the things that brings her to tears is The Passion of the Christ.  Pt explained what she learned from her biggest mistakes was "not to do those things that led up to them".  Lastly, pt identified words she needs to hear as good morning because "I'm glade to wake up".      Plan: Continue to engage patient in RT group sessions 2-3x/week.   Caroll Rancher,  Antonietta Jewel 04/09/2022 12:33 PM

## 2022-04-09 NOTE — Group Note (Signed)
LCSW Group Therapy Note   Group Date: 04/09/2022 Start Time: 1100 End Time: 1200  Topic: Self Care  Due to the acuity and influx of admissions, group was not held. Patient was provided therapeutic worksheets and asked to meet with CSW as needed.     Darletta Moll MSW, LCSW Clincal Social Worker  [facility]

## 2022-04-09 NOTE — [facility] Group Notes (Signed)
Spirituality group facilitated by Kathleen Argue, BCC.   Group Description: Group focused on topic of hope. Patients participated in facilitated discussion around topic, connecting with one another around experiences and definitions for hope. Group members engaged with visual explorer photos, reflecting on what hope looks like for them today. Group engaged in discussion around how their definitions of hope are present today in hospital.   Modalities: Psycho-social ed, Adlerian, Narrative, MI   Patient Progress: Stephanie [name] was present at the beginning of the group and participated and engaged in group conversation.  Comments were very focused on discharging and "getting out." Left the group at some point and began singing as she walked down the hall.  [address] Bcc Pager, [phone]

## 2022-04-09 NOTE — Progress Notes (Signed)
Recreation Therapy Notes  INPATIENT RECREATION THERAPY ASSESSMENT  Patient Details Name: KONICA STANKOWSKI MRN: [id number] DOB: 04-07-1976 Today's Date: 04/09/2022       Information Obtained From: Patient  Able to Participate in Assessment/Interview: Yes  Patient Presentation: Alert  Reason for Admission (Per Patient): Other (Comments) ("acts I shouldn't have performed".  Pt took appliances out of my house that were infested with bugs and didn't work.)  Patient Stressors:  (None)  Coping Skills:   Film/video editor, Journal, TV, Sports, Exercise, Meditate, Music, Prayer, Talk, Avoidance, Read, Dance, Hot Bath/Shower  Leisure Interests (2+):  Games - Video games, [employer] - Physicist, medical, Individual - Other (Comment) (Vacation)  Frequency of Recreation/Participation: Other (Comment) (Video Games-Daily/Monthly; Shopping-Weekly; Vacation- Not Enough)  Awareness of Community Resources:  Yes  Community Resources:  Park, Public affairs consultant, Other (Comment) (Stores)  Current Use: Yes  If no, Barriers?:    Expressed Interest in [employer] Information: No  County of Residence:  [place]  Patient Main Form of Transportation: Walk  Patient Strengths:  Can bounce back from things; don't let things keep her down  Patient Identified Areas of Improvement:  Containing self; Temper; Not blowing up in public places  Patient Goal for Hospitalization:  "make sure I do what doctors and nurses tell me to do"  Current SI (including self-harm):  No  Current HI:  No  Current AVH: No  Staff Intervention Plan: Group Attendance, Collaborate with Interdisciplinary Treatment Team  Consent to Intern Participation: N/A   Caroll Rancher, Richardean Sale, Kala Gassmann A 04/09/2022, 12:53 PM

## 2022-04-09 NOTE — Progress Notes (Signed)
Pt denies SI/HI/AVH and verbally agrees to approach staff if these become apparent or before harming themselves/others. Rates depression 0/10. Rates anxiety 0/10. Rates pain 0/10. Pt has been in her room for the whole day. LAI was given. Pt has been cooperative and calm. Scheduled medications administered to pt, per MD orders. RN provided support and encouragement to pt. Q15 min safety checks implemented and continued. Pt safe on the unit. RN will continue to monitor and intervene as needed.   04/09/22 0758  Psych Admission Type (Psych Patients Only)  Admission Status Involuntary  Psychosocial Assessment  Patient Complaints None  Eye Contact Fair  Facial Expression Flat  Affect Flat;Appropriate to circumstance  Speech Logical/coherent;Soft  Interaction Forwards little;Minimal;Isolative  Motor Activity Other (Comment) (WDL)  Appearance/Hygiene Poor hygiene  Behavior Characteristics Cooperative;Appropriate to situation;Calm  Mood Pleasant;Sad  Thought Process  Coherency Disorganized  Content Paranoia  Delusions None reported or observed  Perception Hallucinations  Hallucination None reported or observed  Judgment Impaired  Confusion None  Danger to Self  Current suicidal ideation? Denies  Danger to Others  Danger to Others None reported or observed

## 2022-04-09 NOTE — BH IP Treatment Plan (Signed)
Interdisciplinary Treatment and Diagnostic Plan Update  04/09/2022 Time of Session: 10:15am Stephanie [name] MRN: [id number]  Principal Diagnosis: Schizophrenia [facility])  Secondary Diagnoses: Principal Problem:   Schizophrenia ([place])   Current Medications:  Current Facility-Administered Medications  Medication Dose Route Frequency Provider Last Rate Last Admin   acetaminophen (TYLENOL) tablet 650 mg  650 mg Oral Q6H PRN Massengill, Ovid Curd, MD       alum & mag hydroxide-simeth (MAALOX/MYLANTA) 200-200-20 MG/5ML suspension 30 mL  30 mL Oral Q4H PRN Massengill, Ovid Curd, MD       ARIPiprazole (ABILIFY) tablet 15 mg  15 mg Oral Daily Massengill, Ovid Curd, MD   15 mg at 04/09/22 0758   ziprasidone (GEODON) injection 20 mg  20 mg Intramuscular BID PRN Massengill, Ovid Curd, MD       And   LORazepam (ATIVAN) injection 1 mg  1 mg Intramuscular BID PRN Massengill, Ovid Curd, MD       And   diphenhydrAMINE (BENADRYL) injection 25 mg  25 mg Intramuscular BID PRN Massengill, Ovid Curd, MD       feeding supplement (ENSURE ENLIVE / ENSURE PLUS) liquid 237 mL  237 mL Oral BID BM Massengill, Nathan, MD   237 mL at 04/09/22 0930   hydrOXYzine (ATARAX) tablet 25 mg  25 mg Oral Q6H Massengill, Nathan, MD   25 mg at 04/09/22 0758   magnesium hydroxide (MILK OF MAGNESIA) suspension 30 mL  30 mL Oral Daily PRN Massengill, Ovid Curd, MD       traZODone (DESYREL) tablet 50 mg  50 mg Oral QHS Massengill, Ovid Curd, MD   50 mg at 04/08/22 2113   PTA Medications: Medications Prior to Admission  Medication Sig Dispense Refill Last Dose   ARIPiprazole (ABILIFY) 20 MG tablet Take 1 tablet (20 mg total) by mouth daily for 7 days. (Patient not taking: Reported on 04/05/2022) 7 tablet 0    hydrOXYzine (ATARAX) 25 MG tablet Take 1 tablet (25 mg total) by mouth every 6 (six) hours. (Patient not taking: Reported on 04/05/2022) 12 tablet 0     Patient Stressors: Other: Pt states, "A lot of problems in [place], a potpourri of problems"    Patient  Strengths: Average or above average intelligence  Capable of independent living  General fund of knowledge   Treatment Modalities: Medication Management, Group therapy, Case management,  1 to 1 session with clinician, Psychoeducation, Recreational therapy.   Physician Treatment Plan for Primary Diagnosis: Schizophrenia ([place]) Long Term Goal(s): Improvement in symptoms so as ready for discharge   Short Term Goals: Ability to identify changes in lifestyle to reduce recurrence of condition will improve Ability to verbalize feelings will improve Ability to demonstrate self-control will improve Ability to identify and develop effective coping behaviors will improve Ability to maintain clinical measurements within normal limits will improve Compliance with prescribed medications will improve Ability to identify triggers associated with substance abuse/mental health issues will improve  Medication Management: Evaluate patient's response, side effects, and tolerance of medication regimen.  Therapeutic Interventions: 1 to 1 sessions, Unit Group sessions and Medication administration.  Evaluation of Outcomes: Not Met  Physician Treatment Plan for Secondary Diagnosis: Principal Problem:   Schizophrenia ([place])  Long Term Goal(s): Improvement in symptoms so as ready for discharge   Short Term Goals: Ability to identify changes in lifestyle to reduce recurrence of condition will improve Ability to verbalize feelings will improve Ability to demonstrate self-control will improve Ability to identify and develop effective coping behaviors will improve Ability to maintain clinical measurements  within normal limits will improve Compliance with prescribed medications will improve Ability to identify triggers associated with substance abuse/mental health issues will improve     Medication Management: Evaluate patient's response, side effects, and tolerance of medication regimen.  Therapeutic  Interventions: 1 to 1 sessions, Unit Group sessions and Medication administration.  Evaluation of Outcomes: Not Met   RN Treatment Plan for Primary Diagnosis: Schizophrenia ([place]) Long Term Goal(s): Knowledge of disease and therapeutic regimen to maintain health will improve  Short Term Goals: Ability to remain free from injury will improve, Ability to verbalize frustration and anger appropriately will improve, Ability to demonstrate self-control, Ability to participate in decision making will improve, Ability to verbalize feelings will improve, Ability to disclose and discuss suicidal ideas, Ability to identify and develop effective coping behaviors will improve, and Compliance with prescribed medications will improve  Medication Management: RN will administer medications as ordered by provider, will assess and evaluate patient's response and provide education to patient for prescribed medication. RN will report any adverse and/or side effects to prescribing provider.  Therapeutic Interventions: 1 on 1 counseling sessions, Psychoeducation, Medication administration, Evaluate responses to treatment, Monitor vital signs and CBGs as ordered, Perform/monitor CIWA, COWS, AIMS and Fall Risk screenings as ordered, Perform wound care treatments as ordered.  Evaluation of Outcomes: Not Met   LCSW Treatment Plan for Primary Diagnosis: Schizophrenia ([place]) Long Term Goal(s): Safe transition to appropriate next level of care at discharge, Engage patient in therapeutic group addressing interpersonal concerns.  Short Term Goals: Engage patient in aftercare planning with referrals and resources, Increase social support, Increase ability to appropriately verbalize feelings, Increase emotional regulation, Facilitate acceptance of mental health diagnosis and concerns, Facilitate patient progression through stages of change regarding substance use diagnoses and concerns, Identify triggers associated with mental  health/substance abuse issues, and Increase skills for wellness and recovery  Therapeutic Interventions: Assess for all discharge needs, 1 to 1 time with Social worker, Explore available resources and support systems, Assess for adequacy in community support network, Educate family and significant other(s) on suicide prevention, Complete Psychosocial Assessment, Interpersonal group therapy.  Evaluation of Outcomes: Not Met   Progress in Treatment: Attending groups: No. Participating in groups: No. Taking medication as prescribed: Yes. Toleration medication: Yes. Family/Significant other contact made: No, will contact:  patient refusing contact. Patient understands diagnosis: No. Discussing patient identified problems/goals with staff: Yes. Medical problems stabilized or resolved: Yes. Denies suicidal/homicidal ideation: Yes. Issues/concerns per patient self-inventory: No. Other: none  New problem(s) identified: No, Describe:  none reported  New Short Term/Long Term Goal(s):    medication stabilization, elimination of SI thoughts, development of comprehensive mental wellness plan.    Patient Goals:  "To get released"  Discharge Plan or Barriers: Patient recently admitted. CSW will continue to follow and assess for appropriate referrals and possible discharge planning.    Reason for Continuation of Hospitalization: Delusions  Hallucinations Medication stabilization Other; describe bizarrre behavior, disorganized  Estimated Length of Stay: 3-5 days  Last [place] Suicide Severity Risk Score: [place] Admission (Current) from 04/07/2022 in [place] [facility] ED from 04/05/2022 in [place] ED from 03/24/2022 in [place] DEPT  C-SSRS RISK CATEGORY No Risk No Risk No Risk       Last PHQ 2/9 Scores:     View : No data to display.          Scribe for Treatment  Team: [employer], LCSW  04/09/2022 11:52 AM

## 2022-04-10 DIAGNOSIS — F201 Disorganized schizophrenia: Secondary | ICD-10-CM | POA: Diagnosis not present

## 2022-04-10 NOTE — Progress Notes (Signed)
   04/10/22 2248  Psych Admission Type (Psych Patients Only)  Admission Status Involuntary  Psychosocial Assessment  Patient Complaints None  Eye Contact Fair  Facial Expression Flat  Affect Appropriate to circumstance  Speech Logical/coherent  Interaction Minimal  Motor Activity Other (Comment) (WDL)  Behavior Characteristics Cooperative  Mood Depressed  Thought Process  Coherency WDL  Content WDL  Delusions None reported or observed  Perception WDL  Hallucination None reported or observed  Judgment Impaired  Confusion None  Danger to Self  Current suicidal ideation? Denies  Agreement Not to Harm Self No  Danger to Others  Danger to Others None reported or observed   D: Patient alert and cooperative. Pt stated she had a good day and tolerating medication well.  A: Medications administered as prescribed. Support and encouragement provided as needed.  R: Patient remains safe on the unit. Will continue to monitor for safety and stability.

## 2022-04-10 NOTE — Progress Notes (Signed)
   04/10/22 0900  Psych Admission Type (Psych Patients Only)  Admission Status Involuntary  Psychosocial Assessment  Patient Complaints None  Eye Contact Fair  Facial Expression Flat  Affect Appropriate to circumstance  Speech Logical/coherent  Interaction Forwards little;Minimal  Motor Activity Other (Comment) (WDL)  Appearance/Hygiene Disheveled  Behavior Characteristics Cooperative  Mood Depressed  Thought Process  Coherency Disorganized  Content Paranoia  Delusions None reported or observed  Perception Hallucinations  Hallucination None reported or observed  Judgment Impaired  Confusion None  Danger to Self  Current suicidal ideation? Denies  Agreement Not to Harm Self Yes  Description of Agreement Verbal  Danger to Others  Danger to Others None reported or observed

## 2022-04-10 NOTE — Progress Notes (Signed)
[facility] MD Progress Note  04/10/2022 2:05 PM Stephanie [name]  MRN:  [id number]  Subjective:   Stephanie [name] is a 46 y.o. female with a reported psychiatric history of schizophrenia and bipolar disorder, who was taken to [facility] ED after presenting vial law enforcement under IVC with complaints that patient has been taking her medications, aggressive behavior, damaging, furniture, and scaring her neighbors with bizarre aggressive behavior.   On assessment today, patient denies having any paranoia and does not appear to have any delusional thought construct.  Reports her mood is "mellow" today. She went to group this morning. She is not having issues with hallucinations or paranoia today. She isn't having any thoughts of harm to self or others either. She is having some soreness in her deltoid from the shot, otherwise no new physical complaints.      Principal Problem: Schizophrenia (HCC) Diagnosis: Principal Problem:   Schizophrenia (HCC)  Total Time spent with patient: 20 minutes  Past Psychiatric History:  Diagnosis: Reports history of being diagnosed with schizophrenia and bipolar in the past.  She also reports "I may have been diagnosed as autistic but I do not know" Patient reports history of multiple psychiatric hospitalizations in the past Denies history of suicide attempts Current medications: None.  Reports that she has not received Abilify injection in many months. Psychiatric medication history: Seroquel, Geodon, Risperdal, Abilify, Lunesta, Ambien, Zyprexa, Haldol (caused throat to swell)  Past Medical History:  Past Medical History:  Diagnosis Date   Schizophrenia (HCC)     Past Surgical History:  Procedure Laterality Date   INDUCED ABORTION     Family History:  Family History  Problem Relation Age of Onset   Cancer Father    Family Psychiatric  History:  Psychiatric: Denies History of suicide attempt: Maternal cousin   Social History:  Social History   Substance  and Sexual Activity  Alcohol Use Yes   Comment: occassional     Social History   Substance and Sexual Activity  Drug Use Never    Social History   Socioeconomic History   Marital status: Unknown    Spouse name: Not on file   Number of children: Not on file   Years of education: Not on file   Highest education level: Not on file  Occupational History   Not on file  Tobacco Use   Smoking status: Never    Passive exposure: Never   Smokeless tobacco: Never  Vaping Use   Vaping Use: Former  Substance and Sexual Activity   Alcohol use: Yes    Comment: occassional   Drug use: Never   Sexual activity: Never  Other Topics Concern   Not on file  Social History Narrative   ** Merged History Encounter **       Social Determinants of Health   Financial Resource Strain: Not on file  Food Insecurity: Not on file  Transportation Needs: Not on file  Physical Activity: Not on file  Stress: Not on file  Social Connections: Not on file   Additional Social History: see H&P                        Sleep: Fair  Appetite:  Fair  Current Medications: Current Facility-Administered Medications  Medication Dose Route Frequency Provider Last Rate Last Admin   acetaminophen (TYLENOL) tablet 650 mg  650 mg Oral Q6H PRN Phineas Inches, MD   650 mg at 04/09/22 2129   alum &  mag hydroxide-simeth (MAALOX/MYLANTA) 200-200-20 MG/5ML suspension 30 mL  30 mL Oral Q4H PRN Massengill, Harrold Donath, MD       ARIPiprazole (ABILIFY) tablet 15 mg  15 mg Oral Daily Massengill, Harrold Donath, MD   15 mg at 04/10/22 0810   ARIPiprazole ER (ABILIFY MAINTENA) injection 400 mg  400 mg Intramuscular Q28 days Massengill, Harrold Donath, MD   400 mg at 04/09/22 1250   ziprasidone (GEODON) injection 20 mg  20 mg Intramuscular BID PRN Massengill, Harrold Donath, MD       And   LORazepam (ATIVAN) injection 1 mg  1 mg Intramuscular BID PRN Massengill, Harrold Donath, MD       And   diphenhydrAMINE (BENADRYL) injection 25 mg  25 mg  Intramuscular BID PRN Massengill, Harrold Donath, MD       feeding supplement (ENSURE ENLIVE / ENSURE PLUS) liquid 237 mL  237 mL Oral BID BM Massengill, Harrold Donath, MD   237 mL at 04/10/22 1056   hydrOXYzine (ATARAX) tablet 25 mg  25 mg Oral Q6H Massengill, Harrold Donath, MD   25 mg at 04/10/22 1056   magnesium hydroxide (MILK OF MAGNESIA) suspension 30 mL  30 mL Oral Daily PRN Massengill, Harrold Donath, MD       traZODone (DESYREL) tablet 50 mg  50 mg Oral QHS Massengill, Harrold Donath, MD   50 mg at 04/09/22 2129    Lab Results:  Results for orders placed or performed during the hospital encounter of 04/07/22 (from the past 48 hour(s))  Basic metabolic panel     Status: Abnormal   Collection Time: 04/09/22  6:30 AM  Result Value Ref Range   Sodium 139 135 - 145 mmol/L   Potassium 3.9 3.5 - 5.1 mmol/L   Chloride 107 98 - 111 mmol/L   CO2 28 22 - 32 mmol/L   Glucose, Bld 86 70 - 99 mg/dL    Comment: Glucose reference range applies only to samples taken after fasting for at least 8 hours.   BUN 19 6 - 20 mg/dL   Creatinine, Ser 1.61 0.44 - 1.00 mg/dL   Calcium 8.6 (L) 8.9 - 10.3 mg/dL   GFR, Estimated >09 >60 mL/min    Comment: (NOTE) Calculated using the CKD-EPI Creatinine Equation (2021)    Anion gap 4 (L) 5 - 15    Comment: Performed at [facility], [address] [place], [place] [address]    Blood Alcohol level:  Lab Results  Component Value Date   ETH <10 04/05/2022   ETH <10 07/31/2021    Metabolic Disorder Labs: Lab Results  Component Value Date   HGBA1C 5.4 07/20/2021   MPG 108.28 07/20/2021   MPG 128 05/02/2021   No results found for: PROLACTIN Lab Results  Component Value Date   CHOL 224 (H) 07/17/2021   TRIG 364 (H) 07/17/2021   HDL 48 07/17/2021   CHOLHDL 4.7 07/17/2021   VLDL 73 (H) 07/17/2021   LDLCALC 103 (H) 07/17/2021   LDLCALC 58 10/15/2020    Physical Findings: AIMS: Facial and Oral Movements Muscles of Facial Expression: None, normal Lips and  Perioral Area: None, normal Jaw: None, normal Tongue: None, normal,Extremity Movements Upper (arms, wrists, hands, fingers): None, normal Lower (legs, knees, ankles, toes): None, normal, Trunk Movements Neck, shoulders, hips: None, normal, Overall Severity Severity of abnormal movements (highest score from questions above): None, normal Incapacitation due to abnormal movements: None, normal Patient's awareness of abnormal movements (rate only patient's report): No Awareness, Dental Status Current problems with teeth and/or dentures?:  No Does patient usually wear dentures?: No  CIWA:  CIWA-Ar Total: 4 COWS:     Musculoskeletal: Strength & Muscle Tone: within normal limits Gait & Station: normal Patient leans: N/A  Psychiatric Specialty Exam:  Presentation  General Appearance: Appropriate for Environment; Casual  Eye Contact:Fair  Speech:Normal Rate  Speech Volume:Normal  Handedness:Right   Mood and Affect  Mood:Euthymic  Affect:Congruent   Thought Process  Thought Processes:Linear  Descriptions of Associations:Intact  Orientation:Full (Time, Place and Person)  Thought Content:Logical  History of Schizophrenia/Schizoaffective disorder:Yes  Duration of Psychotic Symptoms:Greater than six months  Hallucinations:Hallucinations: None  Ideas of Reference:None  Suicidal Thoughts:Suicidal Thoughts: No  Homicidal Thoughts:Homicidal Thoughts: No   Sensorium  Memory:Immediate Fair; Remote Fair  Judgment:Fair  Insight:Fair   Executive Functions  Concentration:Fair  Attention Span:Fair  Recall:Fair  Fund of Knowledge:Fair  Language:Fair   Psychomotor Activity  Psychomotor Activity:Psychomotor Activity: Normal   Assets  Assets:Communication Skills; Leisure Time   Sleep  Sleep:Sleep: Fair    Physical Exam: Physical Exam Vitals reviewed.  Constitutional:      General: She is not in acute distress. Pulmonary:     Effort: Pulmonary  effort is normal.  Neurological:     Mental Status: She is alert.     Motor: No weakness.     Gait: Gait normal.   Review of Systems  Psychiatric/Behavioral:  Negative for depression, hallucinations and suicidal ideas. The patient is nervous/anxious. The patient does not have insomnia.    Blood pressure 99/64, pulse 78, temperature 98.2 F (36.8 C), temperature source Oral, resp. rate 16, height 5\' 1"  (1.549 m), weight 48.6 kg, SpO2 100 %. Body mass index is 20.24 kg/m.   Treatment Plan Summary: Daily contact with patient to assess and evaluate symptoms and progress in treatment   ASSESSMENT:   Diagnoses / Active Problems: Schizophrenia - paranoid type    PLAN: Safety and Monitoring:             -- Involuntary admission to inpatient psychiatric unit for safety, stabilization and treatment             -- Daily contact with patient to assess and evaluate symptoms and progress in treatment             -- Patient's case to be discussed in multi-disciplinary team meeting             -- Observation Level : q15 minute checks             -- Vital signs:  q12 hours             -- Precautions: suicide, elopement, and assault   2. Psychiatric Diagnoses and Treatment:               -Continue abilify 15 mg once daily for schizophrenia -Abilify Maintena LAI 400 mg IM given 5-19. Continue PO abilify for 14 days until 04/23/22.     --  The risks/benefits/side-effects/alternatives to this medication were discussed in detail with the patient and time was given for questions. The patient consents to medication trial.                -- Metabolic profile and EKG monitoring obtained while on an atypical antipsychotic (BMI: Lipid Panel: HbgA1c: QTc:)              -- Encouraged patient to participate in unit milieu and in scheduled group therapies  3. Medical Issues Being Addressed:                   4. Discharge Planning:              -- Social work and case management  to assist with discharge planning and identification of hospital follow-up needs prior to discharge             -- Estimated LOS: 5-7 days             -- Discharge Concerns: Need to establish a safety plan; Medication compliance and effectiveness             -- Discharge Goals: Return home with outpatient referrals for mental health follow-up including medication management/psychotherapy   Total Time Spent in Direct Patient Care:  I personally spent 20 minutes on the unit in direct patient care. The direct patient care time included face-to-face time with the patient, reviewing the patient's chart, communicating with other professionals, and coordinating care. Greater than 50% of this time was spent in counseling or coordinating care with the patient regarding goals of hospitalization, psycho-education, and discharge planning needs.   Roselle LocusStephanie Leigh Iceis Knab, MD 04/10/2022, 2:05 PM

## 2022-04-10 NOTE — Progress Notes (Signed)
   04/10/22 0028  Psych Admission Type (Psych Patients Only)  Admission Status Involuntary  Psychosocial Assessment  Patient Complaints None  Eye Contact Fair  Facial Expression Flat  Affect Appropriate to circumstance  Speech Logical/coherent  Interaction Minimal  Motor Activity Other (Comment) (WDL)  Appearance/Hygiene Disheveled  Behavior Characteristics Cooperative  Mood Sad  Thought Process  Coherency Disorganized  Content Paranoia  Delusions None reported or observed  Perception Hallucinations  Hallucination None reported or observed  Judgment Impaired  Confusion None  Danger to Self  Current suicidal ideation? Denies  Agreement Not to Harm Self Yes  Description of Agreement verbal contract  Danger to Others  Danger to Others None reported or observed   D: Patient in dayroom interacting well with peers. Pt attended and engaged in conversation during wrap up group.  A: Medications administered as prescribed. Support and encouragement provided as needed.  R: Patient remains safe on the unit. Will continue to monitor for safety and stability.

## 2022-04-10 NOTE — Progress Notes (Signed)
Adult Psychoeducational Group Note  Date:  04/10/2022 Time:  8:44 PM  Group Topic/Focus:  Wrap-Up Group:   The focus of this group is to help patients review their daily goal of treatment and discuss progress on daily workbooks.  Participation Level:  Minimal  Participation Quality:  Appropriate  Affect:  Appropriate  Cognitive:  Appropriate  Insight: Appropriate  Engagement in Group:  Engaged  Modes of Intervention:  Education and Exploration  Additional Comments:  Patient attended and participated in group tonight. She reports that going outside today was significant for her  Scot Dock 04/10/2022, 8:44 PM

## 2022-04-10 NOTE — Group Note (Signed)
LCSW Group Therapy Note 04/10/2022    Type of Therapy and Topic:  Group Therapy: Anger and Commonalities  Participation Level:  Active   Description of Group: In this group, patients initially shared an "unknown" fact about themselves and CSW led a discussion about the ways in which we have things in common without realizing it.  Patient then identified a recent time they became angry and how this yet again showed a way in which they had something in common with other patients.  We discussed possible unhealthy reactions to anger and possible healthy reactions.  We also discussed possible underlying emotions that lead to the anger.  Commonalities among group members were pointed out throughout the entirety of group.  Therapeutic Goals: Patients were asked to share something about themselves and learned that they often have things in common with other people without knowing this Patients will remember an incident of anger and how they reacted Patients will be able to identify their reaction as healthy or unhealthy, and identify possible reactions that would have been the opposite Patients will learn that anger itself is a secondary emotion and will think about their primary emotion at the time of their last incident of anger  Summary of Patient Progress:  The patient shared that something interesting she could share about herself is that she has met Shirlean Schlein.  This was expressed as also present in several other patients, so the patient was able to recognize that she is not alone.  The patient also said a frequent cause of anger is discrimination and said her typical reaction is to be like a duck and let it roll off her back.  Her conclusion by the end of group was that this chosen method of coping is healthy.  Therapeutic Modalities:   Cognitive Behavioral Therapy  Maretta Los, LCSW 04/10/2022  1:48 PM

## 2022-04-10 NOTE — [facility] Group Notes (Signed)
Psychoeducational Group Note  Date: 04/10/2022 Time: 0900-1000    Goal Setting   Purpose of Group: This group helps to provide patients with the steps of setting a goal that is specific, measurable, attainable, realistic and time specific. A discussion on how we keep ourselves stuck with negative self talk. Homework given for Patients to write 30 positive attributes about themselves.    Participation Level:  Active  Participation Quality:  Appropriate  Affect:  Appropriate  Cognitive:  Appropriate  Insight:  Improving  Engagement in Group:  Engaged  Additional Comments:  Rates her energy at a 7/10. Participated in the group.  Dione Housekeeper

## 2022-04-11 DIAGNOSIS — F201 Disorganized schizophrenia: Secondary | ICD-10-CM | POA: Diagnosis not present

## 2022-04-11 NOTE — Progress Notes (Signed)
[facility] MD Progress Note  04/11/2022 6:33 PM Stephanie [name]  MRN:  [id number]  Subjective:   Stephanie [name] is a 46 y.o. female with a reported psychiatric history of schizophrenia and bipolar disorder, who was taken to [facility] ED after presenting vial law enforcement under IVC with complaints that patient has been taking her medications, aggressive behavior, damaging, furniture, and scaring her neighbors with bizarre aggressive behavior.   On assessment today, patient states that she had emesis in the morning. She was sweating last night and this morning felt nauseous after breakfast and threw up. She feels better now. She didn't recall anything bad about supper or breakfast. The water with morning medications tasted strange, as sometimes it does at the RN station. She slept well overnight. She doesn't believe it is the medications. No hallucinations or thoughts of harm to self or others. She doesn't believe that she is paranoid today.    Principal Problem: Schizophrenia ([place]) Diagnosis: Principal Problem:   Schizophrenia ([place])  Total Time spent with patient: 20 minutes  Past Psychiatric History:  Diagnosis: Reports history of being diagnosed with schizophrenia and bipolar in the past.  She also reports "I may have been diagnosed as autistic but I do not know" Patient reports history of multiple psychiatric hospitalizations in the past Denies history of suicide attempts Current medications: None.  Reports that she has not received Abilify injection in many months. Psychiatric medication history: Seroquel, Geodon, Risperdal, Abilify, Lunesta, Ambien, Zyprexa, Haldol (caused throat to swell)  Past Medical History:  Past Medical History:  Diagnosis Date   Schizophrenia ([place])     Past Surgical History:  Procedure Laterality Date   INDUCED ABORTION     Family History:  Family History  Problem Relation Age of Onset   Cancer Father    Family Psychiatric  History:  Psychiatric:  Denies History of suicide attempt: Maternal cousin   Social History:  Social History   Substance and Sexual Activity  Alcohol Use Yes   Comment: occassional     Social History   Substance and Sexual Activity  Drug Use Never    Social History   Socioeconomic History   Marital status: Unknown    Spouse name: Not on file   Number of children: Not on file   Years of education: Not on file   Highest education level: Not on file  Occupational History   Not on file  Tobacco Use   Smoking status: Never    Passive exposure: Never   Smokeless tobacco: Never  Vaping Use   Vaping Use: Former  Substance and Sexual Activity   Alcohol use: Yes    Comment: occassional   Drug use: Never   Sexual activity: Never  Other Topics Concern   Not on file  Social History Narrative   ** Merged History Encounter **       Social Determinants of Health   Financial Resource Strain: Not on file  Food Insecurity: Not on file  Transportation Needs: Not on file  Physical Activity: Not on file  Stress: Not on file  Social Connections: Not on file   Additional Social History: see H&P                        Sleep: Fair  Appetite:  Fair  Current Medications: Current Facility-Administered Medications  Medication Dose Route Frequency Provider Last Rate Last Admin   acetaminophen (TYLENOL) tablet 650 mg  650 mg Oral Q6H PRN Massengill,  Ovid Curd, MD   650 mg at 04/09/22 2129   alum & mag hydroxide-simeth (MAALOX/MYLANTA) 200-200-20 MG/5ML suspension 30 mL  30 mL Oral Q4H PRN Massengill, Ovid Curd, MD       ARIPiprazole (ABILIFY) tablet 15 mg  15 mg Oral Daily Massengill, Ovid Curd, MD   15 mg at 04/11/22 0737   ARIPiprazole ER (ABILIFY MAINTENA) injection 400 mg  400 mg Intramuscular Q28 days Massengill, Ovid Curd, MD   400 mg at 04/09/22 1250   ziprasidone (GEODON) injection 20 mg  20 mg Intramuscular BID PRN Massengill, Ovid Curd, MD       And   LORazepam (ATIVAN) injection 1 mg  1 mg  Intramuscular BID PRN Massengill, Ovid Curd, MD       And   diphenhydrAMINE (BENADRYL) injection 25 mg  25 mg Intramuscular BID PRN Massengill, Ovid Curd, MD       hydrOXYzine (ATARAX) tablet 25 mg  25 mg Oral Q6H Massengill, Nathan, MD   25 mg at 04/11/22 1543   magnesium hydroxide (MILK OF MAGNESIA) suspension 30 mL  30 mL Oral Daily PRN Massengill, Ovid Curd, MD       traZODone (DESYREL) tablet 50 mg  50 mg Oral QHS Massengill, Ovid Curd, MD   50 mg at 04/10/22 2048    Lab Results:  No results found for this or any previous visit (from the past 40 hour(s)).   Blood Alcohol level:  Lab Results  Component Value Date   ETH <10 04/05/2022   ETH <10 [id number]    Metabolic Disorder Labs: Lab Results  Component Value Date   HGBA1C 5.4 07/20/2021   MPG 108.28 07/20/2021   MPG 128 05/02/2021   No results found for: PROLACTIN Lab Results  Component Value Date   CHOL 224 (H) 07/17/2021   TRIG 364 (H) 07/17/2021   HDL 48 07/17/2021   CHOLHDL 4.7 07/17/2021   VLDL 73 (H) 07/17/2021   LDLCALC 103 (H) 07/17/2021   LDLCALC 58 10/15/2020    Physical Findings: AIMS: Facial and Oral Movements Muscles of Facial Expression: None, normal Lips and Perioral Area: None, normal Jaw: None, normal Tongue: None, normal,Extremity Movements Upper (arms, wrists, hands, fingers): None, normal Lower (legs, knees, ankles, toes): None, normal, Trunk Movements Neck, shoulders, hips: None, normal, Overall Severity Severity of abnormal movements (highest score from questions above): None, normal Incapacitation due to abnormal movements: None, normal Patient's awareness of abnormal movements (rate only patient's report): No Awareness, Dental Status Current problems with teeth and/or dentures?: No Does patient usually wear dentures?: No  CIWA:  CIWA-Ar Total: 4 COWS:     Musculoskeletal: Strength & Muscle Tone: within normal limits Gait & Station: normal Patient leans: N/A  Psychiatric Specialty  Exam:  Presentation  General Appearance: Appropriate for Environment; Casual  Eye Contact:Fair  Speech:Normal Rate  Speech Volume:Normal  Handedness:Right   Mood and Affect  Mood:Euthymic  Affect:Congruent   Thought Process  Thought Processes:Linear  Descriptions of Associations:Intact  Orientation:Full (Time, Place and Person)  Thought Content:Logical  History of Schizophrenia/Schizoaffective disorder:Yes  Duration of Psychotic Symptoms:Greater than six months  Hallucinations:Hallucinations: None  Ideas of Reference:None  Suicidal Thoughts:Suicidal Thoughts: No  Homicidal Thoughts:Homicidal Thoughts: No   Sensorium  Memory:Immediate Fair; Remote Fair  Judgment:Fair  Insight:Fair   Executive Functions  Concentration:Fair  Attention Span:Fair  [place]   Psychomotor Activity  Psychomotor Activity:Psychomotor Activity: Normal   Assets  Assets:Communication Skills; Leisure Time   Sleep  Sleep:Sleep: Fair    Physical Exam: Physical  Exam Vitals reviewed.  Constitutional:      General: She is not in acute distress. Eyes:     Extraocular Movements: Extraocular movements intact.  Pulmonary:     Effort: Pulmonary effort is normal.  Neurological:     Mental Status: She is alert.     Motor: No weakness.     Gait: Gait normal.   Review of Systems  Constitutional:  Positive for diaphoresis.  Gastrointestinal:  Positive for nausea and vomiting. Negative for constipation and diarrhea.  Neurological:  Positive for headaches.  Psychiatric/Behavioral:  Negative for depression, hallucinations and suicidal ideas. The patient does not have insomnia.    Blood pressure (!) 120/55, pulse 80, temperature 98.7 F (37.1 C), temperature source Oral, resp. rate 16, height 5\' 1"  (1.549 m), weight 48.6 kg, SpO2 100 %. Body mass index is 20.24 kg/m.   Treatment Plan Summary: Daily contact with patient to  assess and evaluate symptoms and progress in treatment   ASSESSMENT:   Diagnoses / Active Problems: Schizophrenia - paranoid type    PLAN: Safety and Monitoring:             -- Involuntary admission to inpatient psychiatric unit for safety, stabilization and treatment             -- Daily contact with patient to assess and evaluate symptoms and progress in treatment             -- Patient's case to be discussed in multi-disciplinary team meeting             -- Observation Level : q15 minute checks             -- Vital signs:  q12 hours             -- Precautions: suicide, elopement, and assault   2. Psychiatric Diagnoses and Treatment:               -Continue abilify 15 mg once daily for schizophrenia -Abilify Maintena LAI 400 mg IM given 5-19. Continue PO abilify for 14 days until 04/23/22.     --  The risks/benefits/side-effects/alternatives to this medication were discussed in detail with the patient and time was given for questions. The patient consents to medication trial.                -- Metabolic profile and EKG monitoring obtained while on an atypical antipsychotic (BMI: Lipid Panel: HbgA1c: QTc:)              -- Encouraged patient to participate in unit milieu and in scheduled group therapies                         3. Medical Issues Being Addressed:                   4. Discharge Planning:              -- Social work and case management to assist with discharge planning and identification of hospital follow-up needs prior to discharge             -- Estimated LOS: 5-7 days             -- Discharge Concerns: Need to establish a safety plan; Medication compliance and effectiveness             -- Discharge Goals: Return home with outpatient referrals for mental health follow-up including medication management/psychotherapy   Total  Time Spent in Direct Patient Care:  I personally spent 20 minutes on the unit in direct patient care. The direct patient care time included  face-to-face time with the patient, reviewing the patient's chart, communicating with other professionals, and coordinating care. Greater than 50% of this time was spent in counseling or coordinating care with the patient regarding goals of hospitalization, psycho-education, and discharge planning needs.   Maida Sale, MD 04/11/2022, 6:33 PM

## 2022-04-11 NOTE — [facility] Group Notes (Signed)
Pt did not attend the group.

## 2022-04-11 NOTE — [facility] Group Notes (Signed)
PsychoEducational Group- Patients were educated on positive reframing. They were given education on how negative thinking can impact our mental health, and examples of positive reframing vs. Negative thinking. A poem was then read by the [place] on ''watch you thoughts '' . Pts were then asked to share a negative belief or thought they would like to let go of. Pt participated and shared she would like to let go of the belief she is fat .

## 2022-04-11 NOTE — Progress Notes (Signed)
Pt had minimal interaction on the unit, pt remembers writer from prior admissions    04/11/22 2100  Charting Type  Charting Type Shift assessment  Safety Check Verification  Has the RN verified the 15 minute safety check completion? Yes  Neurological  Neuro (WDL) WDL  HEENT  HEENT (WDL) WDL  Respiratory  Respiratory (WDL) WDL  Cardiac  Cardiac (WDL) WDL  Vascular  Vascular (WDL) WDL  Integumentary  Integumentary (WDL) WDL  Braden Scale (Ages 8 and up)  Sensory Perceptions 4  Moisture 4  Activity 4  Mobility 4  Nutrition 3  Friction and Shear 3  Braden Scale Score 22  Musculoskeletal  Musculoskeletal (WDL) WDL  Gastrointestinal  Gastrointestinal (WDL) WDL  GU Assessment  Genitourinary (WDL) WDL  Neurological  Level of Consciousness Alert

## 2022-04-11 NOTE — Progress Notes (Signed)
Adult Psychoeducational Group Note  Date:  04/11/2022 Time:  9:02 PM  Group Topic/Focus:  Wrap-Up Group:   The focus of this group is to help patients review their daily goal of treatment and discuss progress on daily workbooks.  Participation Level:  Minimal  Participation Quality:  Appropriate  Affect:  Appropriate  Cognitive:  Confused, Disorganized  Insight: Appropriate  Engagement in Group:  Limited  Modes of Intervention:  Discussion  Additional Comments:  Pt stated her goal for today was to focus on her treatment plan. Pt stated she accomplished her goal today. Pt stated she talk speak with her doctor and with her social worker about her care today. Pt rated her overall day a 6 out of 10. Pt stated she has not been feeling well all day. Writer encourage pt to let her nurse no when she is not feeling well while she is here. Pt stated she understood. Pt stated she was able to contact the Lord today. Writer asked pt to elaborate. Pt stated she could not. Pt stated she felt better about herself tonight. Pt stated she was able to attend all meals today. Pt stated she took all medications provided today. Pt stated her appetite was pretty good today. Pt rated her sleep last night was poor. Pt stated the goal tonight was to get some rest. Pt stated she had no physical pain tonight. Pt deny visual hallucinations and auditory issues tonight. Pt denies thoughts of harming herself or others. Pt stated she would alert staff if anything changed.  Felipa Furnace 04/11/2022, 9:02 PM

## 2022-04-11 NOTE — Progress Notes (Signed)
Pt presents with depressed mood, affect labile. Stephanie [name] reported to Clinical research associate this am she was doing well and '' doing fine. I have no concerns. '' She smiled at Clinical research associate and did appear guarded and not forthcoming with thoughts however denied any acute concerns. Patient took po medications without issues. About 20 minutes later patient came back up to the med window smiling, stating '' I just wanted you to know I threw up the medications. And my breakfast. '' Questioned patient as she did not report anything to Clinical research associate earlier at med window. She then stated '' well I don't know what's wrong with my stomach but it doesn't feel good and I threw up. '' No emesis was witnessed in room. Patient was offered gingerale which she declined. She did attend goals group and reports her sleep as fair, that her depression, hopelessness, and anxiety are 0/10 10 being worst and 0 being none. She states her goal is '' activities that will help me go, and not being sick ''  Patient is visible on the unit. Denies any SI/HI or A/V Hallucination. Pt is safe, will con't to monitor.

## 2022-04-11 NOTE — [facility] Group Notes (Signed)
Pt did not attend am morning group.

## 2022-04-11 NOTE — Group Note (Unsigned)
Date:  04/11/2022 Time:  8:21 AM  Group Topic/Focus:  Orientation:   The focus of this group is to educate the patient on the purpose and policies of crisis stabilization and provide a format to answer questions about their admission.  The group details unit policies and expectations of patients while admitted.     Participation Level:  {[facility] PARTICIPATION WO:[id number]  Participation Quality:  {[facility] PARTICIPATION QUALITY:22265}  Affect:  {[facility] AFFECT:22266}  Cognitive:  {[facility] COGNITIVE:22267}  Insight: {B[facility] Insight2:20797}  Engagement in Group:  {[facility] ENGAGEMENT IN BP:[id number]  Modes of Intervention:  {[facility] MODES OF INTERVENTION:22269}  Additional Comments:  ***  Stephanie [name] 04/11/2022, 8:21 AM

## 2022-04-12 DIAGNOSIS — F201 Disorganized schizophrenia: Secondary | ICD-10-CM | POA: Diagnosis not present

## 2022-04-12 LAB — BASIC METABOLIC PANEL
Anion gap: 7 (ref 5–15)
BUN: 19 mg/dL (ref 6–20)
CO2: 29 mmol/L (ref 22–32)
Calcium: 8.9 mg/dL (ref 8.9–10.3)
Chloride: 107 mmol/L (ref 98–111)
Creatinine, Ser: 0.91 mg/dL (ref 0.44–1.00)
GFR, Estimated: >60 mL/min (ref >60)
Glucose, Bld: 108 mg/dL — ABNORMAL HIGH (ref 70–99)
Potassium: 4.2 mmol/L (ref 3.5–5.1)
Sodium: 143 mmol/L (ref 135–145)

## 2022-04-12 LAB — CBC WITH DIFFERENTIAL/PLATELET
Abs Immature Granulocytes: 0.02 10*3/uL (ref 0.00–0.07)
Basophils Absolute: 0 10*3/uL (ref 0.0–0.1)
Basophils Relative: 0 %
Eosinophils Absolute: 0.1 10*3/uL (ref 0.0–0.5)
Eosinophils Relative: 1 %
HCT: 28.5 % — ABNORMAL LOW (ref 36.0–46.0)
Hemoglobin: 9 g/dL — ABNORMAL LOW (ref 12.0–15.0)
Immature Granulocytes: 0 %
Lymphocytes Relative: 35 %
Lymphs Abs: 1.7 10*3/uL (ref 0.7–4.0)
MCH: 27.9 pg (ref 26.0–34.0)
MCHC: 31.6 g/dL (ref 30.0–36.0)
MCV: 88.2 fL (ref 80.0–100.0)
Monocytes Absolute: 0.4 10*3/uL (ref 0.1–1.0)
Monocytes Relative: 9 %
Neutro Abs: 2.7 10*3/uL (ref 1.7–7.7)
Neutrophils Relative %: 55 %
Platelets: 245 10*3/uL (ref 150–400)
RBC: 3.23 MIL/uL — ABNORMAL LOW (ref 3.87–5.11)
RDW: 15.7 % — ABNORMAL HIGH (ref 11.5–15.5)
WBC: 4.9 10*3/uL (ref 4.0–10.5)
nRBC: 0 % (ref 0.0–0.2)

## 2022-04-12 NOTE — Progress Notes (Signed)
   04/12/22 1700  Psych Admission Type (Psych Patients Only)  Admission Status Involuntary  Psychosocial Assessment  Patient Complaints None  Eye Contact Fair  Facial Expression Flat  Affect Appropriate to circumstance  Speech Logical/coherent  Interaction Assertive  Motor Activity Slow  Appearance/Hygiene Disheveled  Behavior Characteristics Guarded  Mood Anxious  Thought Process  Coherency WDL  Content WDL  Delusions None reported or observed  Perception WDL  Hallucination None reported or observed  Judgment Impaired  Confusion None  Danger to Self  Current suicidal ideation? Denies  Danger to Others  Danger to Others None reported or observed

## 2022-04-12 NOTE — Group Note (Signed)
Recreation Therapy Group Note   Group Topic:Coping Skills  Group Date: 04/12/2022 Start Time: 1000 End Time: 1030 Facilitators: Victorino Sparrow, LRT,CTRS Location: [address] Dayroom   Goal Area(s) Addresses: Patient will define what a coping skill is. Patient will successfully identify positive coping skills they can use post d/c.  Patient will acknowledge benefit(s) of using learned coping skills post d/c.  Group Description: Coping A to Z. Patient asked to identify what a coping skill is and when they use them. Patients with Probation officer discussed healthy versus unhealthy coping skills. Next patients were given a blank worksheet titled "Coping Skills A-Z". Patients were instructed to come up with at least one positive coping skill per letter of the alphabet, addressing a specific challenge (ex: stress, anger, anxiety, depression, grief, doubt, isolation, self-harm/suicidal thoughts, substance use). Patients were given 15 minutes to brainstorm, before ideas were presented to the large group. Patients and LRT debriefed on the importance of coping skill selection based on situation and back-up plans when a skill tried is not effective. At the end of group, patients were given an handout of alphabetized strategies to keep for future reference.   Affect/Mood: Appropriate   Participation Level: Engaged   Participation Quality: Independent   Behavior: Appropriate   Speech/Thought Process: Focused   Insight: Good   Judgement: Good   Modes of Intervention: Worksheet   Patient Response to Interventions:  Engaged   Education Outcome:  Acknowledges education and In group clarification offered    Clinical Observations/Individualized Feedback: Pt was quiet but engaged in the activity.  Pt was able to come up with coping skills for low self esteem.  Some of the coping skills pt identified were aerobics, beauty salon, dieting, exercise, gym, hiking, knitting, nutritionist, painting, self help  books and water sports.  Pt was appropriate and appeared focused during group session.    Plan: Continue to engage patient in RT group sessions 2-3x/week.   Victorino Sparrow, LRT,CTRS 04/12/2022 12:20 PM

## 2022-04-12 NOTE — Progress Notes (Signed)
[facility] MD Progress Note  04/12/2022 6:01 PM Stephanie [name]  MRN:  [id number]  Subjective:   Stephanie [name] is a 46 y.o. female with a reported psychiatric history of schizophrenia and bipolar disorder, who was taken to [facility] ED after presenting vial law enforcement under IVC with complaints that patient has been taking her medications, aggressive behavior, damaging, furniture, and scaring her neighbors with bizarre aggressive behavior.   On assessment today, patient states that she hasn't had any further emesis, but is still feeling nauseous. She has a sore through and malaise. She didn't sleep well overnight due to sweating, but keeps the room very hot. She is still eating okay. No hallucinations, thoughts of harm to self or others. She feels she has a cold or something.    Principal Problem: Schizophrenia (HCC) Diagnosis: Principal Problem:   Schizophrenia (HCC)  Total Time spent with patient: 20 minutes  Past Psychiatric History:  Diagnosis: Reports history of being diagnosed with schizophrenia and bipolar in the past.  She also reports "I may have been diagnosed as autistic but I do not know" Patient reports history of multiple psychiatric hospitalizations in the past Denies history of suicide attempts Current medications: None.  Reports that she has not received Abilify injection in many months. Psychiatric medication history: Seroquel, Geodon, Risperdal, Abilify, Lunesta, Ambien, Zyprexa, Haldol (caused throat to swell)  Past Medical History:  Past Medical History:  Diagnosis Date   Schizophrenia (HCC)     Past Surgical History:  Procedure Laterality Date   INDUCED ABORTION     Family History:  Family History  Problem Relation Age of Onset   Cancer Father    Family Psychiatric  History:  Psychiatric: Denies History of suicide attempt: Maternal cousin   Social History:  Social History   Substance and Sexual Activity  Alcohol Use Yes   Comment: occassional      Social History   Substance and Sexual Activity  Drug Use Never    Social History   Socioeconomic History   Marital status: Unknown    Spouse name: Not on file   Number of children: Not on file   Years of education: Not on file   Highest education level: Not on file  Occupational History   Not on file  Tobacco Use   Smoking status: Never    Passive exposure: Never   Smokeless tobacco: Never  Vaping Use   Vaping Use: Former  Substance and Sexual Activity   Alcohol use: Yes    Comment: occassional   Drug use: Never   Sexual activity: Never  Other Topics Concern   Not on file  Social History Narrative   ** Merged History Encounter **       Social Determinants of Health   Financial Resource Strain: Not on file  Food Insecurity: Not on file  Transportation Needs: Not on file  Physical Activity: Not on file  Stress: Not on file  Social Connections: Not on file   Additional Social History: see H&P                        Sleep: Fair  Appetite:  Fair  Current Medications: Current Facility-Administered Medications  Medication Dose Route Frequency Provider Last Rate Last Admin   acetaminophen (TYLENOL) tablet 650 mg  650 mg Oral Q6H PRN Massengill, Harrold Donath, MD   650 mg at 04/12/22 0541   alum & mag hydroxide-simeth (MAALOX/MYLANTA) 200-200-20 MG/5ML suspension 30 mL  30 mL  Oral Q4H PRN Massengill, Harrold Donath, MD       ARIPiprazole (ABILIFY) tablet 15 mg  15 mg Oral Daily Massengill, Nathan, MD   15 mg at 04/12/22 4944   ARIPiprazole ER (ABILIFY MAINTENA) injection 400 mg  400 mg Intramuscular Q28 days Massengill, Harrold Donath, MD   400 mg at 04/09/22 1250   ziprasidone (GEODON) injection 20 mg  20 mg Intramuscular BID PRN Massengill, Harrold Donath, MD       And   LORazepam (ATIVAN) injection 1 mg  1 mg Intramuscular BID PRN Massengill, Harrold Donath, MD       And   diphenhydrAMINE (BENADRYL) injection 25 mg  25 mg Intramuscular BID PRN Massengill, Harrold Donath, MD       hydrOXYzine  (ATARAX) tablet 25 mg  25 mg Oral Q6H Massengill, Nathan, MD   25 mg at 04/12/22 1656   magnesium hydroxide (MILK OF MAGNESIA) suspension 30 mL  30 mL Oral Daily PRN Massengill, Harrold Donath, MD       traZODone (DESYREL) tablet 50 mg  50 mg Oral QHS Phineas Inches, MD   50 mg at 04/11/22 2034    Lab Results:  No results found for this or any previous visit (from the past 48 hour(s)).   Blood Alcohol level:  Lab Results  Component Value Date   ETH <10 04/05/2022   ETH <10 07/31/2021    Metabolic Disorder Labs: Lab Results  Component Value Date   HGBA1C 5.4 07/20/2021   MPG 108.28 07/20/2021   MPG 128 05/02/2021   No results found for: PROLACTIN Lab Results  Component Value Date   CHOL 224 (H) 07/17/2021   TRIG 364 (H) 07/17/2021   HDL 48 07/17/2021   CHOLHDL 4.7 07/17/2021   VLDL 73 (H) 07/17/2021   LDLCALC 103 (H) 07/17/2021   LDLCALC 58 10/15/2020    Physical Findings: AIMS: Facial and Oral Movements Muscles of Facial Expression: None, normal Lips and Perioral Area: None, normal Jaw: None, normal Tongue: None, normal,Extremity Movements Upper (arms, wrists, hands, fingers): None, normal Lower (legs, knees, ankles, toes): None, normal, Trunk Movements Neck, shoulders, hips: None, normal, Overall Severity Severity of abnormal movements (highest score from questions above): None, normal Incapacitation due to abnormal movements: None, normal Patient's awareness of abnormal movements (rate only patient's report): No Awareness, Dental Status Current problems with teeth and/or dentures?: No Does patient usually wear dentures?: No  CIWA:  CIWA-Ar Total: 4 COWS:     Musculoskeletal: Strength & Muscle Tone: within normal limits Gait & Station: normal Patient leans: N/A  Psychiatric Specialty Exam:  Presentation  General Appearance: Appropriate for Environment  Eye Contact:Fair  Speech:Normal Rate  Speech Volume:Normal  Handedness:Right   Mood and Affect   Mood:Euthymic  Affect:Blunt   Thought Process  Thought Processes:Linear  Descriptions of Associations:Intact  Orientation:Partial  Thought Content:Logical  History of Schizophrenia/Schizoaffective disorder:Yes  Duration of Psychotic Symptoms:Greater than six months  Hallucinations:Hallucinations: None   Ideas of Reference:None  Suicidal Thoughts:Suicidal Thoughts: No   Homicidal Thoughts:Homicidal Thoughts: No    Sensorium  Memory:Immediate Fair; Recent Fair  Judgment:Fair  Insight:Shallow   Executive Functions  Concentration:Fair  Attention Span:Fair  Recall:Fair  Fund of Knowledge:Fair  Language:Fair   Psychomotor Activity  Psychomotor Activity:Psychomotor Activity: Decreased    Assets  Assets:Leisure Time   Sleep  Sleep:Sleep: Poor     Physical Exam: Physical Exam Vitals reviewed.  Constitutional:      General: She is not in acute distress.    Appearance: She is not ill-appearing or  diaphoretic.  HENT:     Head: Normocephalic.  Eyes:     Extraocular Movements: Extraocular movements intact.  Pulmonary:     Effort: Pulmonary effort is normal.  Neurological:     Mental Status: She is alert.     Motor: No weakness.     Gait: Gait normal.   Review of Systems  Constitutional:  Positive for diaphoresis and malaise/fatigue.  HENT:  Positive for sore throat.   Respiratory:  Negative for cough.   Cardiovascular:  Negative for chest pain.  Gastrointestinal:  Positive for nausea. Negative for constipation, diarrhea and vomiting.  Neurological:  Negative for headaches.  Psychiatric/Behavioral:  Negative for depression, hallucinations and suicidal ideas. The patient does not have insomnia.    Blood pressure 115/63, pulse 75, temperature 99.5 F (37.5 C), temperature source Oral, resp. rate 16, height 5\' 1"  (1.549 m), weight 48.6 kg, SpO2 100 %. Body mass index is 20.24 kg/m.   Treatment Plan Summary: Daily contact with patient to  assess and evaluate symptoms and progress in treatment   ASSESSMENT:   Diagnoses / Active Problems: Schizophrenia - paranoid type    PLAN: Safety and Monitoring:             -- Involuntary admission to inpatient psychiatric unit for safety, stabilization and treatment             -- Daily contact with patient to assess and evaluate symptoms and progress in treatment             -- Patient's case to be discussed in multi-disciplinary team meeting             -- Observation Level : q15 minute checks             -- Vital signs:  q12 hours             -- Precautions: suicide, elopement, and assault   2. Psychiatric Diagnoses and Treatment:               -Continue abilify 15 mg once daily for schizophrenia -Abilify Maintena LAI 400 mg IM given 5-19. Continue PO abilify for 14 days until 04/23/22.     --  The risks/benefits/side-effects/alternatives to this medication were discussed in detail with the patient and time was given for questions. The patient consents to medication trial.                -- Metabolic profile and EKG monitoring obtained while on an atypical antipsychotic (BMI: Lipid Panel: HbgA1c: QTc:)              -- Encouraged patient to participate in unit milieu and in scheduled group therapies                         3. Medical Issues Being Addressed: labs Pending- bmp, cbc and UA                  4. Discharge Planning:              -- Social work and case management to assist with discharge planning and identification of hospital follow-up needs prior to discharge             -- Estimated LOS: 5-7 days             -- Discharge Concerns: Need to establish a safety plan; Medication compliance and effectiveness             --  Discharge Goals: Return home with outpatient referrals for mental health follow-up including medication management/psychotherapy   Total Time Spent in Direct Patient Care:  I personally spent 20 minutes on the unit in direct patient care. The direct  patient care time included face-to-face time with the patient, reviewing the patient's chart, communicating with other professionals, and coordinating care. Greater than 50% of this time was spent in counseling or coordinating care with the patient regarding goals of hospitalization, psycho-education, and discharge planning needs.   Roselle LocusStephanie Leigh Jonne Rote, MD 04/12/2022, 6:01 PM

## 2022-04-12 NOTE — Progress Notes (Signed)
Adult Psychoeducational Group Note  Date:  04/12/2022 Time:  8:48 PM  Group Topic/Focus:  Wrap-Up Group:   The focus of this group is to help patients review their daily goal of treatment and discuss progress on daily workbooks.  Participation Level:  Active  Participation Quality:  Appropriate  Affect:  Appropriate  Cognitive:  Appropriate  Insight: Appropriate  Engagement in Group:  Engaged  Modes of Intervention:  Discussion  Additional Comments:  Pt stated her goal for today was to focus on her treatment plan. Pt stated she accomplished her goal today. Pt stated she talk speak with her doctor and with her social worker about her care today. Pt rated her overall day a 7 out of 10. Pt stated she made no calls today. Pt stated she felt better about herself tonight. Pt stated she was able to attend all meals today. Pt stated she took all medications provided today. Pt stated her appetite was pretty good today. Pt rated her sleep last night was pretty good. Pt stated the goal tonight was to get some rest. Pt stated she had some physical pain tonight. Pt stated she had a moderate headache tonight. Pt rated the moderate headache a 7 on the pain level scale. Pt nurse was updated on the situation. Pt deny visual hallucinations and auditory issues tonight. Pt denies thoughts of harming herself or others. Pt stated she would alert staff if anything changed.    Stephanie [name] 04/12/2022, 8:48 PM

## 2022-04-12 NOTE — Progress Notes (Signed)
Pt had minimal interaction on the unit this evening     04/12/22 2300  Psych Admission Type (Psych Patients Only)  Admission Status Involuntary  Psychosocial Assessment  Patient Complaints None  Eye Contact Fair  Facial Expression Flat  Affect Appropriate to circumstance  Speech Logical/coherent  Interaction Assertive  Motor Activity Slow  Appearance/Hygiene Disheveled  Behavior Characteristics Guarded  Mood Preoccupied  Aggressive Behavior  Effect No apparent injury  Thought Process  Coherency WDL  Content WDL  Delusions None reported or observed  Perception WDL  Hallucination None reported or observed  Judgment Impaired  Confusion None  Danger to Self  Current suicidal ideation? Denies  Danger to Others  Danger to Others None reported or observed

## 2022-04-12 NOTE — Group Note (Signed)
LCSW Group Therapy Note  Group Date: 04/12/2022 Start Time: 1300 End Time: 1400   Type of Therapy and Topic:  Group Therapy - How To Cope with Nervousness about Discharge   Participation Level:  Minimal   Description of Group This process group involved identification of patients' feelings about discharge. Some of them are scheduled to be discharged soon, while others are new admissions, but each of them was asked to share thoughts and feelings surrounding discharge from the hospital. One common theme was that they are excited at the prospect of going home, while another was that many of them are apprehensive about sharing why they were hospitalized. Patients were given the opportunity to discuss these feelings with their peers in preparation for discharge.  Therapeutic Goals  Patient will identify their overall feelings about pending discharge. Patient will think about how they might proactively address issues that they believe will once again arise once they get home (i.e. with parents). Patients will participate in discussion about having hope for change.   Summary of Patient Progress:  Patient attended group and quickly became agitated and left the group.    Therapeutic Modalities Cognitive Behavioral Therapy   Beatris Si, LCSW 04/12/2022  3:19 PM

## 2022-04-12 NOTE — Progress Notes (Signed)
   04/12/22 0500  Sleep  Number of Hours 4.25

## 2022-04-13 MED ORDER — ARIPIPRAZOLE 10 MG PO TABS
20.0000 mg | ORAL_TABLET | Freq: Every day | ORAL | Status: DC
  Administered 2022-04-14 – 2022-04-16 (×3): 20 mg via ORAL
  Filled 2022-04-13 (×4): qty 2

## 2022-04-13 MED ORDER — ARIPIPRAZOLE 10 MG PO TABS
10.0000 mg | ORAL_TABLET | Freq: Every day | ORAL | Status: DC
  Filled 2022-04-13: qty 1

## 2022-04-13 MED ORDER — LORAZEPAM 0.5 MG PO TABS
0.5000 mg | ORAL_TABLET | Freq: Two times a day (BID) | ORAL | Status: DC
  Administered 2022-04-13 – 2022-04-16 (×7): 0.5 mg via ORAL
  Filled 2022-04-13 (×7): qty 1

## 2022-04-13 MED ORDER — LORAZEPAM 1 MG PO TABS: 1.0000 mg | ORAL_TABLET | Freq: Three times a day (TID) | ORAL | Status: DC | PRN

## 2022-04-13 NOTE — Progress Notes (Signed)
[facility]  04/13/2022 3:53 PM Stephanie [name]  MRN:  [id number]  Subjective:   Stephanie [name] is a 46 y.o. female with a reported psychiatric history of schizophrenia and bipolar disorder, who [facility] after presenting vial law enforcement under IVC with complaints that patient has been taking her medications, aggressive behavior, damaging, furniture, and scaring her neighbors with bizarre aggressive behavior.On assessment today, the patient reports  On assessment today, the patient is significantly more delusional and paranoid than she has been previously.  She states "all the medicines I am taking are placebos.  They are not even real.  They are just salt tablets.  I should not even be here.  I am not IVC'd.  This police officers were not even real they were pretending.  There is a Technical brewerfake magistrate who signed the IVC."  Per nursing, patient did not want to take medications this morning. During my interview, the patient agreed per social work to have signed consent for discharge planning.  Later in the morning per social work, on their assessment, the patient declined consents.   Patient's mood is irritable.  Sleep is okay.  Appetite WNL. Denies SI.  Denies HI.  Denies AH, VH, or ideas of reference, thought control or thought insertion.  Pt denies feeling restless or having the urge to move or pace.    Principal Problem: Schizophrenia (HCC) Diagnosis: Principal Problem:   Schizophrenia (HCC)  Total Time spent with patient: 20 minutes  Past Psychiatric History:  Diagnosis: Reports history of being diagnosed with schizophrenia and bipolar in the past.  She also reports "I may have been diagnosed as autistic but I do not know" Patient reports history of multiple psychiatric hospitalizations in the past Denies history of suicide attempts Current medications: None.  Reports that she has not received Abilify injection in many months. Psychiatric medication history:  Seroquel, Geodon, Risperdal, Abilify, Lunesta, Ambien, Zyprexa, Haldol (caused throat to swell)  Past Medical History:  Past Medical History:  Diagnosis Date   Schizophrenia (HCC)     Past Surgical History:  Procedure Laterality Date   INDUCED ABORTION     Family History:  Family History  Problem Relation Age of Onset   Cancer Father    Family Psychiatric  History:  Psychiatric: Denies History of suicide attempt: Maternal cousin    Social History:  Social History   Substance and Sexual Activity  Alcohol Use Yes   Comment: occassional     Social History   Substance and Sexual Activity  Drug Use Never    Social History   Socioeconomic History   Marital status: Unknown    Spouse name: Not on file   Number of children: Not on file   Years of education: Not on file   Highest education level: Not on file  Occupational History   Not on file  Tobacco Use   Smoking status: Never    Passive exposure: Never   Smokeless tobacco: Never  Vaping Use   Vaping Use: Former  Substance and Sexual Activity   Alcohol use: Yes    Comment: occassional   Drug use: Never   Sexual activity: Never  Other Topics Concern   Not on file  Social History Narrative   ** Merged History Encounter **       Social Determinants of Health   Financial Resource Strain: Not on file  Food Insecurity: Not on file  Transportation Needs: Not on file  Physical Activity:  Not on file  Stress: Not on file  Social Connections: Not on file   Additional Social History:                         Sleep: Good  Appetite:  Good  Current Medications: Current Facility-Administered Medications  Medication Dose Route Frequency Provider Last Rate Last Admin   acetaminophen (TYLENOL) tablet 650 mg  650 mg Oral Q6H PRN Azizah Lisle, Harrold Donath, MD   650 mg at 04/12/22 0541   alum & mag hydroxide-simeth (MAALOX/MYLANTA) 200-200-20 MG/5ML suspension 30 mL  30 mL Oral Q4H PRN Nadalee Neiswender, Harrold Donath, MD        Melene Muller ON 04/14/2022] ARIPiprazole (ABILIFY) tablet 20 mg  20 mg Oral Daily Keane Martelli, MD       ARIPiprazole ER (ABILIFY MAINTENA) injection 400 mg  400 mg Intramuscular Q28 days Firman Petrow, Harrold Donath, MD   400 mg at 04/09/22 1250   ziprasidone (GEODON) injection 20 mg  20 mg Intramuscular BID PRN Oaklie Durrett, Harrold Donath, MD       And   LORazepam (ATIVAN) injection 1 mg  1 mg Intramuscular BID PRN Cecillia Menees, Harrold Donath, MD       And   diphenhydrAMINE (BENADRYL) injection 25 mg  25 mg Intramuscular BID PRN Teka Chanda, Harrold Donath, MD       hydrOXYzine (ATARAX) tablet 25 mg  25 mg Oral Q6H Dreshawn Hendershott, Harrold Donath, MD   25 mg at 04/13/22 0827   LORazepam (ATIVAN) tablet 0.5 mg  0.5 mg Oral Q12H Madiha Bambrick, Harrold Donath, MD   0.5 mg at 04/13/22 1351   LORazepam (ATIVAN) tablet 1 mg  1 mg Oral Q8H PRN Fontella Shan, Harrold Donath, MD       magnesium hydroxide (MILK OF MAGNESIA) suspension 30 mL  30 mL Oral Daily PRN Phineas Inches, MD       traZODone (DESYREL) tablet 50 mg  50 mg Oral QHS Phineas Inches, MD   50 mg at 04/11/22 2034    Lab Results:  Results for orders placed or performed during the hospital encounter of 04/07/22 (from the past 48 hour(s))  CBC with Differential/Platelet     Status: Abnormal   Collection Time: 04/12/22  6:28 PM  Result Value Ref Range   WBC 4.9 4.0 - 10.5 K/uL   RBC 3.23 (L) 3.87 - 5.11 MIL/uL   Hemoglobin 9.0 (L) 12.0 - 15.0 g/dL   HCT 92.4 (L) 46.2 - 86.3 %   MCV 88.2 80.0 - 100.0 fL   MCH 27.9 26.0 - 34.0 pg   MCHC 31.6 30.0 - 36.0 g/dL   RDW 81.7 (H) 71.1 - 65.7 %   Platelets 245 150 - 400 K/uL   nRBC 0.0 0.0 - 0.2 %   Neutrophils Relative % 55 %   Neutro Abs 2.7 1.7 - 7.7 K/uL   Lymphocytes Relative 35 %   Lymphs Abs 1.7 0.7 - 4.0 K/uL   Monocytes Relative 9 %   Monocytes Absolute 0.4 0.1 - 1.0 K/uL   Eosinophils Relative 1 %   Eosinophils Absolute 0.1 0.0 - 0.5 K/uL   Basophils Relative 0 %   Basophils Absolute 0.0 0.0 - 0.1 K/uL   Immature Granulocytes 0 %    Abs Immature Granulocytes 0.02 0.00 - 0.07 K/uL    Comment: Performed at [facility], [address], [place], [place] [address]  Basic metabolic panel     Status: Abnormal   Collection Time: 04/12/22  6:28 PM  Result Value Ref Range  Sodium 143 135 - 145 mmol/L   Potassium 4.2 3.5 - 5.1 mmol/L   Chloride 107 98 - 111 mmol/L   CO2 29 22 - 32 mmol/L   Glucose, Bld 108 (H) 70 - 99 mg/dL    Comment: Glucose reference range applies only to samples taken after fasting for at least 8 hours.   BUN 19 6 - 20 mg/dL   Creatinine, Ser 2.22 0.44 - 1.00 mg/dL   Calcium 8.9 8.9 - 97.9 mg/dL   GFR, Estimated >89 >21 mL/min    Comment: (NOTE) Calculated using the CKD-EPI Creatinine Equation (2021)    Anion gap 7 5 - 15    Comment: Performed at [facility], [address], [place], [place] [address]    Blood Alcohol level:  Lab Results  Component Value Date   ETH <10 04/05/2022   ETH <10 07/31/2021    Metabolic Disorder Labs: Lab Results  Component Value Date   HGBA1C 5.4 07/20/2021   MPG 108.28 07/20/2021   MPG 128 05/02/2021   No results found for: PROLACTIN Lab Results  Component Value Date   CHOL 224 (H) 07/17/2021   TRIG 364 (H) 07/17/2021   HDL 48 07/17/2021   CHOLHDL 4.7 07/17/2021   VLDL 73 (H) 07/17/2021   LDLCALC 103 (H) 07/17/2021   LDLCALC 58 10/15/2020    Physical Findings: AIMS: Facial and Oral Movements Muscles of Facial Expression: None, normal Lips and Perioral Area: None, normal Jaw: None, normal Tongue: None, normal,Extremity Movements Upper (arms, wrists, hands, fingers): None, normal Lower (legs, knees, ankles, toes): None, normal, Trunk Movements Neck, shoulders, hips: None, normal, Overall Severity Severity of abnormal movements (highest score from questions above): None, normal Incapacitation due to abnormal movements: None, normal Patient's awareness of abnormal movements (rate only patient's report): No  Awareness, Dental Status Current problems with teeth and/or dentures?: No Does patient usually wear dentures?: No  CIWA:  CIWA-Ar Total: 4 COWS:     Musculoskeletal: Strength & Muscle Tone: within normal limits Gait & Station: normal Patient leans: N/A  Psychiatric Specialty Exam:  Presentation  General Appearance: Disheveled  Eye Contact:-- (intense)  Speech:Pressured  Speech Volume:Normal  Handedness:Right   Mood and Affect  Mood:Anxious; Irritable  Affect:Labile   Thought Process  Thought Processes:Goal Directed  Descriptions of Associations:Intact  Orientation:Full (Time, Place and Person)  Thought Content:Delusions; Paranoid Ideation  History of Schizophrenia/Schizoaffective disorder:Yes  Duration of Psychotic Symptoms:Greater than six months  Hallucinations:Hallucinations: None  Ideas of Reference:Delusions; Paranoia  Suicidal Thoughts:Suicidal Thoughts: No  Homicidal Thoughts:Homicidal Thoughts: No   Sensorium  Memory:Immediate Fair; Recent Fair; Remote Fair  Judgment:Poor  Insight:Lacking   Executive Functions  Concentration:Poor  Attention Span:Poor  Recall:Fair  Fund of Knowledge:Fair  Language:Fair   Psychomotor Activity  Psychomotor Activity:Psychomotor Activity: Decreased   Assets  Assets:Leisure Time   Sleep  Sleep:Sleep: Fair    Physical Exam: Physical Exam Vitals reviewed.  Pulmonary:     Effort: Pulmonary effort is normal.  Neurological:     Motor: No weakness.     Gait: Gait normal.   Review of Systems  Constitutional:  Negative for chills and fever.  Cardiovascular:  Negative for chest pain and palpitations.  Neurological:  Negative for dizziness, tingling, tremors and headaches.  Psychiatric/Behavioral:  The patient is nervous/anxious.    Blood pressure 116/74, pulse 75, temperature 98.3 F (36.8 C), temperature source Oral, resp. rate 16, height 5\' 1"  (1.549 m), weight 48.6 kg, SpO2 100 %. Body  mass index  is 20.24 kg/m.   Treatment Plan Summary: Daily contact with patient to assess and evaluate symptoms and progress in treatment   ASSESSMENT:   Diagnoses / Active Problems: Schizophrenia - paranoid type    PLAN: Safety and Monitoring:             -- Involuntary admission to inpatient psychiatric unit for safety, stabilization and treatment             -- Daily contact with patient to assess and evaluate symptoms and progress in treatment             -- Patient's case to be discussed in multi-disciplinary team meeting             -- Observation Level : q15 minute checks             -- Vital signs:  q12 hours             -- Precautions: suicide, elopement, and assault   2. Psychiatric Diagnoses and Treatment:               -INCREASE abilify from 15 mg to 20 once daily for schizophrenia -Abilify Maintena LAI 400 mg IM given 5-19. Continue PO abilify for 14 days until 04/23/22.  -Start ativan 0.5 mg bid  -Start ativan 1 mg q8H PRN  PO    --  The risks/benefits/side-effects/alternatives to this medication were discussed in detail with the patient and time was given for questions. The patient consents to medication trial.              -- Metabolic profile and EKG monitoring obtained while on an atypical antipsychotic (BMI: Lipid Panel: HbgA1c: QTc:)              -- Encouraged patient to participate in unit milieu and in scheduled group therapies                         3. Medical Issues Being Addressed: labs Pending- bmp, cbc and UA                  4. Discharge Planning:              -- Social work and case management to assist with discharge planning and identification of hospital follow-up needs prior to discharge             -- Estimated LOS: 5-7 days             -- Discharge Concerns: Need to establish a safety plan; Medication compliance and effectiveness             -- Discharge Goals: Return home with outpatient referrals for mental health follow-up including  medication management/psychotherapy    Cristy Hilts, MD 04/13/2022, 3:53 PM  Total Time Spent in Direct Patient Care:  I personally spent 35 minutes on the unit in direct patient care. The direct patient care time included face-to-face time with the patient, reviewing the patient's chart, communicating with other professionals, and coordinating care. Greater than 50% of this time was spent in counseling or coordinating care with the patient regarding goals of hospitalization, psycho-education, and discharge planning needs.   Phineas Inches, MD Psychiatrist

## 2022-04-13 NOTE — Progress Notes (Signed)
Adult Psychoeducational Group Note  Date:  04/13/2022 Time:  10:02 PM  Group Topic/Focus:  Wrap-Up Group:   The focus of this group is to help patients review their daily goal of treatment and discuss progress on daily workbooks.  Participation Level:  Active  Participation Quality:  Appropriate  Affect:  Anxious, Blunted, Depressed, and Irritable  Cognitive:  Disorganized, Confused, and Delusional  Insight: Lacking and Limited  Engagement in Group:  Lacking and Limited  Modes of Intervention:  Discussion  Additional Comments:  Pt stated her goal for today was to focus on her treatment plan. Pt stated she accomplished her goal today. Pt stated she talk speak with her doctor and with her social worker about her care today. Pt rated her overall day a 0 out of 10. Pt stated she made no calls today. Pt stated she felt better about herself tonight. Pt stated she was able to attend all meals today. Pt stated she took all medications provided today. Pt stated her appetite was pretty good today. Pt rated her sleep last night was fair. Pt stated the goal tonight was to get some rest. Pt stated she had no physical pain tonight.  Pt deny visual hallucinations and auditory issues tonight. Pt denies thoughts of harming herself or others. Pt stated she would alert staff if anything changed.    Stephanie [name] 04/13/2022, 10:02 PM

## 2022-04-13 NOTE — Progress Notes (Signed)
   04/13/22 1945  Psych Admission Type (Psych Patients Only)  Admission Status Involuntary  Psychosocial Assessment  Patient Complaints Anxiety  Eye Contact Fair  Facial Expression Flat  Affect Appropriate to circumstance  Speech Logical/coherent  Interaction Assertive  Motor Activity Slow  Appearance/Hygiene Disheveled  Behavior Characteristics Guarded  Mood Preoccupied  Aggressive Behavior  Effect No apparent injury  Thought Process  Coherency WDL  Content WDL  Delusions None reported or observed  Perception WDL  Hallucination None reported or observed  Judgment Impaired  Confusion None  Danger to Self  Current suicidal ideation? Denies  Danger to Others  Danger to Others None reported or observed

## 2022-04-13 NOTE — Progress Notes (Signed)
   04/13/22 0500  Sleep  Number of Hours 7.75

## 2022-04-13 NOTE — Group Note (Signed)
Recreation Therapy Group Note   Group Topic:Communication  Group Date: 04/13/2022 Start Time: 1000 End Time: 1030 Facilitators: Caroll Rancher, LRT,CTRS Location: [address] Dayroom   Goal Area(s) Addresses:  Patient will effectively listen to complete activity.  Patient will identify communication skills used to make activity successful.  Patient will identify how skills used during activity can be used to reach post d/c goals.    Group Description:  Geometric Drawings.  Three volunteers from the peer group will be shown an abstract picture with a particular arrangement of geometrical shapes.  Each round, one 'speaker' will describe the pattern, as accurately as possible without revealing the image to the group.  The remaining group members will listen and draw the picture to reflect how it is described to them. Patients with the role of 'listener' cannot ask clarifying questions but, may request that the speaker repeat a direction. Once the drawings are complete, the presenter will show the rest of the group the picture and compare how close each person came to drawing the picture. LRT will facilitate a post-activity discussion regarding effective communication and the importance of planning, listening, and asking for clarification in daily interactions with others.   Affect/Mood: Flat   Participation Level: Minimal   Participation Quality: Independent   Behavior: Defiant and Paranoid   Speech/Thought Process: Delusional   Insight: Impaired   Judgement: Impaired   Modes of Intervention: Activity   Patient Response to Interventions:  Disengaged   Education Outcome:  In group clarification offered    Clinical Observations/Individualized Feedback: Pt started off calm in group.  Pt then stated she saw someone walk through the wall.  Later on in group, pt had an outburst thinking peers were talking about her.  Pt stated "what is this pick on Terah day or has it been that the  whole time I've been here"?  LRT tried to reassure pt no one was talking about her.  Pt was not receptive to it and left group and did not return.    Plan: Continue to engage patient in RT group sessions 2-3x/week.   Caroll Rancher, LRT,CTRS 04/13/2022 12:10 PM

## 2022-04-14 ENCOUNTER — Encounter (HOSPITAL_COMMUNITY): Payer: Self-pay

## 2022-04-14 LAB — URINALYSIS, COMPLETE (UACMP) WITH MICROSCOPIC
Bacteria, UA: NONE SEEN
Bilirubin Urine: NEGATIVE
Glucose, UA: NEGATIVE mg/dL
Hgb urine dipstick: NEGATIVE
Ketones, ur: NEGATIVE mg/dL
Nitrite: NEGATIVE
Protein, ur: NEGATIVE mg/dL
Specific Gravity, Urine: 1.017 (ref 1.005–1.030)
pH: 7 (ref 5.0–8.0)

## 2022-04-14 MED ORDER — ZIPRASIDONE HCL 20 MG PO CAPS
20.0000 mg | ORAL_CAPSULE | Freq: Two times a day (BID) | ORAL | Status: DC
  Administered 2022-04-14 – 2022-04-15 (×2): 20 mg via ORAL
  Filled 2022-04-14 (×4): qty 1

## 2022-04-14 NOTE — Progress Notes (Addendum)
Pt alert, visible in dayroom at longer intervals during shift. Denies SI, HI, AVH and pain. Reported she slept well with fair appetite.Observed with bright affect earlier this shift but is now flat / blunted as shift progressed. She's suspicious, watchful about her medications, staff and peers in milieu. Paranoid on interactions, believes people are following her, her medications were switched by her provider "I have to take new medicines because I was crying a little earlier. I shouldn't have to take new medication". Needed constant reassurance to take her scheduled medications.  Pt attended and participated minimally in scheduled groups. Safety checks maintained at Q 15 minutes intervals without self harm gestures or outburst. Emotional support, reassurance and encouragement offered to pt. Verbal education provided on all medications and effects monitored. Pt tolerated meals, fluids and medications well without discomfort. Went off unit for meals and returned without issues.

## 2022-04-14 NOTE — Progress Notes (Signed)
Minimal interaction on  the unit this evening    04/14/22 2130  Psych Admission Type (Psych Patients Only)  Admission Status Involuntary  Psychosocial Assessment  Patient Complaints None  Eye Contact Fair  Facial Expression Flat  Affect Appropriate to circumstance  Speech Logical/coherent  Interaction Assertive  Motor Activity Slow  Appearance/Hygiene Disheveled  Behavior Characteristics Cooperative  Mood Preoccupied  Aggressive Behavior  Effect No apparent injury  Thought Process  Coherency WDL  Content WDL  Delusions None reported or observed  Perception WDL  Hallucination None reported or observed  Judgment Impaired  Confusion None  Danger to Self  Current suicidal ideation? Denies  Danger to Others  Danger to Others None reported or observed

## 2022-04-14 NOTE — Group Note (Signed)
  Type of Therapy and Topic:  Group Therapy:  Healthy and Unhealthy Supports  Participation Level:  Active   Description of Group:  Patients in this group were introduced to the idea of adding a variety of healthy supports to address the various needs in their lives.Patients discussed what additional healthy supports could be helpful in their recovery and wellness after discharge in order to prevent future hospitalizations.   An emphasis was placed on using counselor, doctor, therapy groups, 12-step groups, and problem-specific support groups to expand supports.  They also worked as a group on developing a specific plan for several patients to deal with unhealthy supports through boundary-setting, psychoeducation with loved ones, and even termination of relationships.   Therapeutic Goals:   1)  discuss importance of adding supports to stay well once out of the hospital  2)  compare healthy versus unhealthy supports and identify some examples of each  3)  generate ideas and descriptions of healthy supports that can be added  4)  offer mutual support about how to address unhealthy supports  5)  encourage active participation in and adherence to discharge plan    Summary of Patient Progress: Patient was appropriate in group and had good insight into group topic.  Patient was able to identify social supports in life and how they can be strengthened.    Therapeutic Modalities:   Motivational Interviewing Brief Solution-Focused Therapy  Beatris Si, LCSW 04/14/2022  2:09 PM

## 2022-04-14 NOTE — Group Note (Signed)
Recreation Therapy Group Note   Group Topic:Team Building  Group Date: 04/14/2022 Start Time: 1005 End Time: 1035 Facilitators: Caroll Rancher, LRT,CTRS Location: [address] Dayroom   Goal Area(s) Addresses:  Patient will effectively work with peer towards shared goal.  Patient will identify skills used to make activity successful.  Patient will identify how skills used during activity can be applied to reach post d/c goals.    Group Description: [employer]. In teams of 5-6, patients were given 25 small craft pipe cleaners. Using the materials provided, patients were instructed to compete again the opposing team(s) to build the tallest free-standing structure from floor level. The activity was timed; difficulty increased by Clinical research associate as Production designer, theatre/television/film continued.  Systematically resources were removed with additional directions for example, placing one arm behind their back, working in silence, and shape stipulations. LRT facilitated post-activity discussion reviewing team processes and necessary communication skills involved in completion. Patients were encouraged to reflect how the skills utilized, or not utilized, in this activity can be incorporated to positively impact support systems post discharge.   Affect/Mood: Flat   Participation Level: Engaged   Participation Quality: Independent   Behavior: Appropriate   Speech/Thought Process: Focused   Insight: Fair   Judgement: Fair    Modes of Intervention: Team-building   Patient Response to Interventions:  Engaged   Education Outcome:  Acknowledges education and In group clarification offered    Clinical Observations/Individualized Feedback: Pt came to group after it had already started.  Pt wanted to work alone on a tower.  Pt was given supplies and proceeded to complete her tower.  Pt expressed she would need to use communication with her support system.  Pt also stated "you have to know what each persons  perception of a tower is".  Pt was appropriate during group session.     Plan: Continue to engage patient in RT group sessions 2-3x/week.   Caroll Rancher, LRT,CTRS 04/14/2022 1:15 PM

## 2022-04-14 NOTE — BH IP Treatment Plan (Signed)
Interdisciplinary Treatment and Diagnostic Plan Update  04/14/2022 Time of Session: 9:55am  Stephanie CanalesOwetta D [name] MRN: [id number]  Principal Diagnosis: Schizophrenia [facility](HCC)  Secondary Diagnoses: Principal Problem:   Schizophrenia (HCC)   Current Medications:  Current Facility-Administered Medications  Medication Dose Route Frequency Provider Last Rate Last Admin   acetaminophen (TYLENOL) tablet 650 mg  650 mg Oral Q6H PRN [name], Harrold DonathNathan, MD   650 mg at 04/12/22 0541   alum & mag hydroxide-simeth (MAALOX/MYLANTA) 200-200-20 MG/5ML suspension 30 mL  30 mL Oral Q4H PRN [name], Harrold DonathNathan, MD       ARIPiprazole (ABILIFY) tablet 20 mg  20 mg Oral Daily [name], Harrold DonathNathan, MD   20 mg at 04/14/22 0841   ARIPiprazole ER (ABILIFY MAINTENA) injection 400 mg  400 mg Intramuscular Q28 days [name], Harrold DonathNathan, MD   400 mg at 04/09/22 1250   ziprasidone (GEODON) injection 20 mg  20 mg Intramuscular BID PRN [name], Harrold DonathNathan, MD       And   LORazepam (ATIVAN) injection 1 mg  1 mg Intramuscular BID PRN [name], Harrold DonathNathan, MD       And   diphenhydrAMINE (BENADRYL) injection 25 mg  25 mg Intramuscular BID PRN [name], Harrold DonathNathan, MD       hydrOXYzine (ATARAX) tablet 25 mg  25 mg Oral Q6H [name], Nathan, MD   25 mg at 04/14/22 0841   LORazepam (ATIVAN) tablet 0.5 mg  0.5 mg Oral Q12H [name], Harrold DonathNathan, MD   0.5 mg at 04/14/22 0841   LORazepam (ATIVAN) tablet 1 mg  1 mg Oral Q8H PRN [name], Harrold DonathNathan, MD       magnesium hydroxide (MILK OF MAGNESIA) suspension 30 mL  30 mL Oral Daily PRN [name], Harrold DonathNathan, MD       traZODone (DESYREL) tablet 50 mg  50 mg Oral QHS [name], Harrold DonathNathan, MD   50 mg at 04/13/22 2058   PTA Medications: Medications Prior to Admission  Medication Sig Dispense Refill Last Dose   ARIPiprazole (ABILIFY) 20 MG tablet Take 1 tablet (20 mg total) by mouth daily for 7 days. (Patient not taking: Reported on 04/05/2022) 7 tablet 0    hydrOXYzine (ATARAX) 25 MG tablet Take 1 tablet (25  mg total) by mouth every 6 (six) hours. (Patient not taking: Reported on 04/05/2022) 12 tablet 0     Patient Stressors: Other: Pt states, "[place], [place] potpourri of problems"    Patient Strengths: Average or above average intelligence  Capable of independent living  General fund of knowledge   Treatment Modalities: Medication Management, Group therapy, Case management,  1 to 1 session with clinician, Psychoeducation, Recreational therapy.   Physician Treatment Plan for Primary Diagnosis: Schizophrenia (HCC) Long Term Goal(s): Improvement in symptoms so as ready for discharge   Short Term Goals: Ability to identify changes in lifestyle to reduce recurrence of condition will improve Ability to verbalize feelings will improve Ability to demonstrate self-control will improve Ability to identify and develop effective coping behaviors will improve Ability to maintain clinical measurements within normal limits will improve Compliance with prescribed medications will improve Ability to identify triggers associated with substance abuse/mental health issues will improve  Medication Management: Evaluate patient's response, side effects, and tolerance of medication regimen.  Therapeutic Interventions: 1 to 1 sessions, Unit Group sessions and Medication administration.  Evaluation of Outcomes: Progressing  Physician Treatment Plan for Secondary Diagnosis: Principal Problem:   Schizophrenia (HCC)  Long Term Goal(s): Improvement in symptoms so as ready for discharge   Short  Term Goals: Ability to identify changes in lifestyle to reduce recurrence of condition will improve Ability to verbalize feelings will improve Ability to demonstrate self-control will improve Ability to identify and develop effective coping behaviors will improve Ability to maintain clinical measurements within normal limits will improve Compliance with prescribed medications will improve Ability to identify  triggers associated with substance abuse/mental health issues will improve     Medication Management: Evaluate patient's response, side effects, and tolerance of medication regimen.  Therapeutic Interventions: 1 to 1 sessions, Unit Group sessions and Medication administration.  Evaluation of Outcomes: Progressing   RN Treatment Plan for Primary Diagnosis: Schizophrenia (HCC) Long Term Goal(s): Knowledge of disease and therapeutic regimen to maintain health will improve  Short Term Goals: Ability to remain free from injury will improve, Ability to participate in decision making will improve, Ability to verbalize feelings will improve, Ability to disclose and discuss suicidal ideas, and Ability to identify and develop effective coping behaviors will improve  Medication Management: RN will administer medications as ordered by provider, will assess and evaluate patient's response and provide education to patient for prescribed medication. RN will report any adverse and/or side effects to prescribing provider.  Therapeutic Interventions: 1 on 1 counseling sessions, Psychoeducation, Medication administration, Evaluate responses to treatment, Monitor vital signs and CBGs as ordered, Perform/monitor CIWA, COWS, AIMS and Fall Risk screenings as ordered, Perform wound care treatments as ordered.  Evaluation of Outcomes: Progressing   LCSW Treatment Plan for Primary Diagnosis: Schizophrenia (HCC) Long Term Goal(s): Safe transition to appropriate next level of care at discharge, Engage patient in therapeutic group addressing interpersonal concerns.  Short Term Goals: Engage patient in aftercare planning with referrals and resources, Increase social support, Increase emotional regulation, Facilitate acceptance of mental health diagnosis and concerns, Identify triggers associated with mental health/substance abuse issues, and Increase skills for wellness and recovery  Therapeutic Interventions: Assess  for all discharge needs, 1 to 1 time with Social worker, Explore available resources and support systems, Assess for adequacy in community support network, Educate family and significant other(s) on suicide prevention, Complete Psychosocial Assessment, Interpersonal group therapy.  Evaluation of Outcomes: Progressing   Progress in Treatment: Attending groups: No. Participating in groups: No. Taking medication as prescribed: Yes. Toleration medication: Yes. Family/Significant other contact made: No, will contact:  Declined Consents  Patient understands diagnosis: No. Discussing patient identified problems/goals with staff: Yes. Medical problems stabilized or resolved: Yes. Denies suicidal/homicidal ideation: Yes. Issues/concerns per patient self-inventory: No. Other: none   New problem(s) identified: No, Describe:  none reported   New Short Term/Long Term Goal(s): medication stabilization, elimination of SI thoughts, development of comprehensive mental wellness plan.      Patient Goals:  "To get released"   Discharge Plan or Barriers: Patient recently admitted. CSW will continue to follow and assess for appropriate referrals and possible discharge planning.      Reason for Continuation of Hospitalization: Delusions  Hallucinations Medication stabilization Other; describe bizarrre behavior, disorganized   Estimated Length of Stay: 3-5 days  Last 3 [place] Suicide Severity Risk Score: Flowsheet Row [facility] (Current) [facility] from [facility]/05/2022 [facility] from 03/24/2022 in [facility]  C-SSRS RISK CATEGORY No Risk No Risk No Risk       Last PHQ 2/9 Scores:     View : No data to display.          Scribe for Treatment  Team: Aram Beecham, LCSWA 04/14/2022 10:57 AM

## 2022-04-14 NOTE — Progress Notes (Signed)
   04/14/22 0530  Sleep  Number of Hours 6.5

## 2022-04-14 NOTE — Progress Notes (Signed)
Adult Psychoeducational Group Note  Date:  04/14/2022 Time:  8:29 PM  Group Topic/Focus:  Wrap-Up Group:   The focus of this group is to help patients review their daily goal of treatment and discuss progress on daily workbooks.  Participation Level:  Did Not Attend  Participation Quality:  Did not attend  Affect:  Did not attend   Cognitive:  Did not attend  Insight: None  Engagement in Group:  Did not attend  Modes of Intervention:  Did not attend   Additional Comments:   Pt was encouraged to attend group discussion but refused.   Gerhard Perches 04/14/2022, 8:29 PM
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
## 2022-04-15 MED ORDER — ZIPRASIDONE HCL 40 MG PO CAPS
40.0000 mg | ORAL_CAPSULE | Freq: Two times a day (BID) | ORAL | Status: DC
  Filled 2022-04-15 (×2): qty 1

## 2022-04-15 MED ORDER — ZIPRASIDONE HCL 20 MG PO CAPS
20.0000 mg | ORAL_CAPSULE | Freq: Two times a day (BID) | ORAL | Status: DC
  Administered 2022-04-15 – 2022-04-16 (×2): 20 mg via ORAL
  Filled 2022-04-15 (×4): qty 1

## 2022-04-15 NOTE — Progress Notes (Signed)
   04/15/22 0500  Sleep  Number of Hours 9

## 2022-04-15 NOTE — Progress Notes (Signed)
[facility] MD Progress Note  04/15/2022 12:51 PM Stephanie [name]  MRN:  [id number]  Subjective:   Stephanie [name] is a 46 y.o. female with a reported psychiatric history of schizophrenia and bipolar disorder, who was taken to [facility] ED after presenting vial law enforcement under IVC with complaints that patient has been taking her medications, aggressive behavior, damaging, furniture, and scaring her neighbors with bizarre aggressive behavior.On assessment today, the patient reports  On assessment today, patient denies having any paranoid thoughts.  She does not demonstrate any delusional thought content or structures.  She denies feeling that other patients or staff are plotting against her.  She denies feeling that other people are causing electricity tissue through her skull.  She denies AH, VH.  Reports that mood is stable.  Reports anxiety is less.  Reports sleep is better.  Appetite is okay.  Concentration is better.  Denies SI.  Denies HI.  Denies side effects current psychiatric medications.  Patient reports he is tolerating addition of Geodon without any side effects. We discussed she must give consent to social work, to contact [place] who is her case Production designer, theatre/television/film, to confirm the patient can return to where she was living before or not.  Patient is agreeable to do this and also continue her current psychiatric medications.  Pt denies feeling restless or having the urge to move or pace.    Principal Problem: Schizophrenia (HCC) Diagnosis: Principal Problem:   Schizophrenia (HCC)    Past Psychiatric History:  Diagnosis: Reports history of being diagnosed with schizophrenia and bipolar in the past.  She also reports "I may have been diagnosed as autistic but I do not know" Patient reports history of multiple psychiatric hospitalizations in the past Denies history of suicide attempts Current medications: None.  Reports that she has not received Abilify injection in many months. Psychiatric medication  history: Seroquel, Geodon, Risperdal, Abilify, Lunesta, Ambien, Zyprexa, Haldol (caused throat to swell)  Past Medical History:  Past Medical History:  Diagnosis Date   Schizophrenia (HCC)     Past Surgical History:  Procedure Laterality Date   INDUCED ABORTION     Family History:  Family History  Problem Relation Age of Onset   Cancer Father    Family Psychiatric  History:  Psychiatric: Denies History of suicide attempt: Maternal cousin    Social History:  Social History   Substance and Sexual Activity  Alcohol Use Yes   Comment: occassional     Social History   Substance and Sexual Activity  Drug Use Never    Social History   Socioeconomic History   Marital status: Unknown    Spouse name: Not on file   Number of children: Not on file   Years of education: Not on file   Highest education level: Not on file  Occupational History   Not on file  Tobacco Use   Smoking status: Never    Passive exposure: Never   Smokeless tobacco: Never  Vaping Use   Vaping Use: Former  Substance and Sexual Activity   Alcohol use: Yes    Comment: occassional   Drug use: Never   Sexual activity: Never  Other Topics Concern   Not on file  Social History Narrative   ** Merged History Encounter **       Social Determinants of Health   Financial Resource Strain: Not on file  Food Insecurity: Not on file  Transportation Needs: Not on file  Physical Activity: Not on file  Stress:  Not on file  Social Connections: Not on file   Additional Social History:                         Sleep: Good  Appetite:  Good  Current Medications: Current Facility-Administered Medications  Medication Dose Route Frequency Provider Last Rate Last Admin   acetaminophen (TYLENOL) tablet 650 mg  650 mg Oral Q6H PRN Amaiah Cristiano, Harrold DonathNathan, MD   650 mg at 04/12/22 0541   alum & mag hydroxide-simeth (MAALOX/MYLANTA) 200-200-20 MG/5ML suspension 30 mL  30 mL Oral Q4H PRN Marisha Renier,  Harrold DonathNathan, MD       ARIPiprazole (ABILIFY) tablet 20 mg  20 mg Oral Daily Ariatna Jester, Harrold DonathNathan, MD   20 mg at 04/15/22 0839   ARIPiprazole ER (ABILIFY MAINTENA) injection 400 mg  400 mg Intramuscular Q28 days Symon Norwood, Harrold DonathNathan, MD   400 mg at 04/09/22 1250   ziprasidone (GEODON) injection 20 mg  20 mg Intramuscular BID PRN Sueellen Kayes, Harrold DonathNathan, MD       And   LORazepam (ATIVAN) injection 1 mg  1 mg Intramuscular BID PRN Joey Lierman, Harrold DonathNathan, MD       And   diphenhydrAMINE (BENADRYL) injection 25 mg  25 mg Intramuscular BID PRN Lashaye Fisk, Harrold DonathNathan, MD       hydrOXYzine (ATARAX) tablet 25 mg  25 mg Oral Q6H Cenia Zaragosa, MD   25 mg at 04/15/22 0839   LORazepam (ATIVAN) tablet 0.5 mg  0.5 mg Oral Q12H Darshay Deupree, Harrold DonathNathan, MD   0.5 mg at 04/15/22 0840   LORazepam (ATIVAN) tablet 1 mg  1 mg Oral Q8H PRN Analynn Daum, Harrold DonathNathan, MD       magnesium hydroxide (MILK OF MAGNESIA) suspension 30 mL  30 mL Oral Daily PRN Quenton Recendez, Harrold DonathNathan, MD       traZODone (DESYREL) tablet 50 mg  50 mg Oral QHS Draco Malczewski, Harrold DonathNathan, MD   50 mg at 04/14/22 2040   ziprasidone (GEODON) capsule 20 mg  20 mg Oral BID WC Haskell Rihn, MD        Lab Results:  No results found for this or any previous visit (from the past 48 hour(s)).   Blood Alcohol level:  Lab Results  Component Value Date   ETH <10 04/05/2022   ETH <10 07/31/2021    Metabolic Disorder Labs: Lab Results  Component Value Date   HGBA1C 5.4 07/20/2021   MPG 108.28 07/20/2021   MPG 128 05/02/2021   No results found for: PROLACTIN Lab Results  Component Value Date   CHOL 224 (H) 07/17/2021   TRIG 364 (H) 07/17/2021   HDL 48 07/17/2021   CHOLHDL 4.7 07/17/2021   VLDL 73 (H) 07/17/2021   LDLCALC 103 (H) 07/17/2021   LDLCALC 58 10/15/2020    Physical Findings: AIMS: Facial and Oral Movements Muscles of Facial Expression: None, normal Lips and Perioral Area: None, normal Jaw: None, normal Tongue: None, normal,Extremity Movements Upper (arms,  wrists, hands, fingers): None, normal Lower (legs, knees, ankles, toes): None, normal, Trunk Movements Neck, shoulders, hips: None, normal, Overall Severity Severity of abnormal movements (highest score from questions above): None, normal Incapacitation due to abnormal movements: None, normal Patient's awareness of abnormal movements (rate only patient's report): No Awareness, Dental Status Current problems with teeth and/or dentures?: No Does patient usually wear dentures?: No  CIWA:  CIWA-Ar Total: 4 COWS:     Musculoskeletal: Strength & Muscle Tone: within normal limits Gait & Station: normal Patient leans: N/A  Psychiatric Specialty Exam:  Presentation  General Appearance: casual, not odorous  Eye Contact:-- (intense)  Speech:nml rate, clear   Speech Volume:Normal  Handedness:Right   Mood and Affect  Mood:less anxious, euthymic  Affect:full range   Thought Process  Thought Processes:Goal Directed  Descriptions of Associations:Intact  Orientation:Full (Time, Place and Person)  Thought Content:No Delusions or paranoid Ideation appreciated   History of Schizophrenia/Schizoaffective disorder:Yes  Duration of Psychotic Symptoms:Greater than six months  Hallucinations:No data recorded Denies AH, VH   Ideas of Reference:denies Delusions; denies Paranoia  Suicidal Thoughts:No data recorded Denies   Homicidal Thoughts:No data recorded Denies   Sensorium  Memory:Immediate Fair; Recent Fair; Remote Fair  Judgment:improving   Insight:Lacking   Executive Functions  Concentration:improving  Attention Span:improving  Recall:Fair  Fund of Knowledge:Fair  Language:Fair   Psychomotor Activity  Psychomotor Activity:No data recorded   Assets  Assets:Leisure Time   Sleep  Sleep:No data recorded okay   Physical Exam: Physical Exam Vitals reviewed.  Pulmonary:     Effort: Pulmonary effort is normal.  Neurological:     Motor: No weakness.      Gait: Gait normal.   Review of Systems  Constitutional:  Negative for chills and fever.  Cardiovascular:  Negative for chest pain and palpitations.  Neurological:  Negative for dizziness, tingling, tremors and headaches.  Psychiatric/Behavioral:  The patient is nervous/anxious.    Blood pressure 102/72, pulse 64, temperature 98.6 F (37 C), temperature source Oral, resp. rate 16, height  (1.549 m), weight 48.6 kg, SpO2 100 %. Body mass index is 20.24 kg/m.   Treatment Plan Summary: Daily contact with patient to assess and evaluate symptoms and progress in treatment   ASSESSMENT:   Diagnoses / Active Problems: -Schizophrenia - paranoid type    PLAN: Safety and Monitoring:             -- Involuntary admission to inpatient psychiatric unit for safety, stabilization and treatment             -- Daily contact with patient to assess and evaluate symptoms and progress in treatment             -- Patient's case to be discussed in multi-disciplinary team meeting             -- Observation Level : q15 minute checks             -- Vital signs:  q12 hours             -- Precautions: suicide, elopement, and assault   2. Psychiatric Diagnoses and Treatment:               -Continue abilify 20 once daily for schizophrenia -Abilify Maintena LAI 400 mg IM given 5-19. Continue PO abilify for 14 days until 04/23/22.  -Continue geodon 20 mg bid with meals - for psychosis not responding to Abilify -Continue ativan 0.5 mg bid (can stop at discharge)     --  The risks/benefits/side-effects/alternatives to this medication were discussed in detail with the patient and time was given for questions. The patient consents to medication trial.              -- Metabolic profile and EKG monitoring obtained while on an atypical antipsychotic (BMI: Lipid Panel: HbgA1c: QTc:)              -- Encouraged patient to participate in unit milieu and in scheduled group therapies  3.  Medical Issues Being Addressed:                   4. Discharge Planning:              -- Social work and case management to assist with discharge planning and identification of hospital follow-up needs prior to discharge             -- Estimated LOS: 5-7 days             -- Discharge Concerns: Need to establish a safety plan; Medication compliance and effectiveness             -- Discharge Goals: Return home with outpatient referrals for mental health follow-up including medication management/psychotherapy    Cristy Hilts, MD 04/15/2022, 12:51 PM  Total Time Spent in Direct Patient Care:  I personally spent 25 minutes on the unit in direct patient care. The direct patient care time included face-to-face time with the patient, reviewing the patient's chart, communicating with other professionals, and coordinating care. Greater than 50% of this time was spent in counseling or coordinating care with the patient regarding goals of hospitalization, psycho-education, and discharge planning needs.   Phineas Inches, MD Psychiatrist

## 2022-04-15 NOTE — Group Note (Signed)
Recreation Therapy Group Note   Group Topic:Stress Management  Group Date: 04/15/2022 Start Time: 1010 End Time: 1045 Facilitators: Caroll Rancher, LRT,CTRS Location: [address] Dayroom   Goal Area(s) Addresses:  Patient will identify reactions to stress. Patient will identify causes of stress. Patient will identify what they can control related to stress.   Group Description:  Symptoms of Stress.  LRT and patients discussed what stress is and the affects it can have on a person.  Patients were then given worksheets where patients had to identify triggers that cause them stress.  Patient were to then identify the causes of stress they can control versus the ones they couldn't control.  Patients would then share their findings with the group.    Affect/Mood: N/A   Participation Level: Did not attend    Clinical Observations/Individualized Feedback:     Plan: Continue to engage patient in RT group sessions 2-3x/week.   Caroll Rancher, LRT,CTRS 04/15/2022 11:53 AM

## 2022-04-15 NOTE — Group Note (Signed)
Occupational Therapy Group Note  Group Topic:Other  Group Date: 04/15/2022 Start Time: 1400 End Time: 1500 Facilitators: Ted Mcalpine, OT    In today's OT group, we discussed what boundaries are, how to create and use them, and the benefits of boundary setting. We will also explore the challenges individuals may face while setting and adhering to healthy boundaries, along with practical strategies to overcome these challenges. Setting healthy boundaries is essential for individuals suffering from depression and anxiety, grief, and loss. Healthy boundaries promote self-esteem, reduce stress and anxiety, improve relationships, and increase emotional well-being.  However, individuals may face challenges in setting and adhering to healthy boundaries, such as guilt and shame, fear of rejection, lack of assertiveness, and difficulty identifying personal boundaries.  By practicing self-care, identifying personal values, practicing assertiveness, and seeking support, individuals can overcome these challenges and set and maintain healthy boundaries effectively.  Kerrin Champagne, OT     Participation Level: Non-verbal and Minimal   Participation Quality: Minimal Cues   Behavior: Attentive , Calm, and On-looking   Speech/Thought Process: Directed   Affect/Mood: Flat   Insight: Fair   Judgement: Fair   Individualization: Pt was present however passive in their participation of group discussion/activity. There appears to to have been new skills learned and were identified  Modes of Intervention: Discussion and Education  Patient Response to Interventions:  Attentive   Plan: Continue to engage patient in OT groups 2 - 3x/week.  04/15/2022  Ted Mcalpine, OT Kerrin Champagne, OT

## 2022-04-15 NOTE — Progress Notes (Signed)
   04/15/22 2015  Psych Admission Type (Psych Patients Only)  Admission Status Involuntary  Psychosocial Assessment  Patient Complaints None  Eye Contact Fair  Facial Expression Flat  Affect Appropriate to circumstance  Speech Logical/coherent  Interaction Assertive  Motor Activity Slow  Appearance/Hygiene Disheveled  Behavior Characteristics Cooperative  Mood Preoccupied  Aggressive Behavior  Effect No apparent injury  Thought Process  Coherency WDL  Content WDL  Delusions None reported or observed  Perception WDL  Hallucination None reported or observed  Judgment Impaired  Confusion None  Danger to Self  Current suicidal ideation? Denies  Danger to Others  Danger to Others None reported or observed

## 2022-04-15 NOTE — B[facility] Group Notes (Signed)
Pt did not attend wrap-up group   

## 2022-04-16 DIAGNOSIS — F200 Paranoid schizophrenia: Principal | ICD-10-CM

## 2022-04-16 MED ORDER — ARIPIPRAZOLE ER 400 MG IM SRER: 400.0000 mg | INTRAMUSCULAR | 0 refills | Status: AC

## 2022-04-16 MED ORDER — ZIPRASIDONE HCL 20 MG PO CAPS: 20.0000 mg | ORAL_CAPSULE | Freq: Two times a day (BID) | ORAL | 0 refills | Status: AC

## 2022-04-16 NOTE — Progress Notes (Signed)
Stephanie [name]  D/C'd Home per MD order.  Discussed with the patient and all questions fully answered.  VSS, Skin clean, dry and intact without evidence of skin break down, no evidence of skin tears noted.  An After Visit Summary was printed and given to the patient. Patient received prescription.  D/c education completed with patient including follow up instructions, medication list, d/c activities limitations if indicated, with other d/c instructions as indicated by MD - patient able to verbalize understanding, all questions fully answered.   Patient instructed to return to ED, call 911, or call MD for any changes in condition.   Patient escorted to the main entrance, and D/C home via [employer].Stephanie [name] 04/16/2022 11:16 AM

## 2022-04-16 NOTE — Plan of Care (Signed)
  Problem: Coping: Goal: Ability to verbalize frustrations and anger appropriately will improve Outcome: Progressing Goal: Ability to demonstrate self-control will improve Outcome: Progressing   Problem: Safety: Goal: Periods of time without injury will increase Outcome: Progressing   

## 2022-04-16 NOTE — BHH Suicide Risk Assessment (Cosign Needed Addendum)
Suicide Risk Assessment  Discharge Assessment    [facility] Discharge Suicide Risk Assessment   Principal Problem: Schizophrenia [facility]) Discharge Diagnoses: Principal Problem:   Schizophrenia (HCC)    Reason for Admission: "Stephanie [name] is a 46 y.o. female with a reported psychiatric history of schizophrenia and bipolar disorder, who was taken to [facility] ED after presenting vial law enforcement under IVC with complaints that patient has been taking her medications, aggressive behavior, damaging, furniture, and scaring her neighbors with bizarre aggressive behavior. "   Evaluation at discharge: [place] was seen and evaluated by this practitioner in attending psychiatrist MD Lucianne Muss.  Denying suicidal or homicidal ideations.  Denies auditory visual hallucinations.  States she has been medication compliant denying any medication side effects during this admission.  Reports she going to return back home to her apartment CSW to follow-up with discharge planning.  Chart reviewed patient has been initiated on multiple antipsychotics in the past.  Consider long-acting injectable at outpatient follow-up. Patient has outpatient follow-up with ACT services.   Total Time spent with patient: 15 minutes  Musculoskeletal: Strength & Muscle Tone: within normal limits Gait & Station: normal Patient leans: N/A  Psychiatric Specialty Exam  Presentation  General Appearance: Appropriate for Environment  Eye Contact:Good  Speech:Clear and Coherent  Speech Volume:Normal  Handedness:Right   Mood and Affect  Mood:Anxious; Depressed  Duration of Depression Symptoms: Less than two weeks  Affect:Appropriate; Congruent   Thought Process  Thought Processes:Coherent  Descriptions of Associations:Intact  Orientation:Full (Time, Place and Person)  Thought Content:Logical  History of Schizophrenia/Schizoaffective disorder:Yes  Duration of Psychotic Symptoms:Greater than six  months  Hallucinations:Hallucinations: Auditory  Ideas of Reference:Paranoia  Suicidal Thoughts:Suicidal Thoughts: No  Homicidal Thoughts:Homicidal Thoughts: No   Sensorium  Memory:Immediate Fair; Recent Fair; Remote Fair  Judgment:Fair  Insight:Lacking   Executive Functions  Concentration:Fair  Attention Span:Fair  Recall:Fair  Fund of Knowledge:Fair  Language:Fair   Psychomotor Activity  Psychomotor Activity:Psychomotor Activity: Normal; Restlessness  Assets  Assets:Housing; Social Support; Talents/Skills   Sleep  Sleep:Sleep: Good  Physical Exam: Physical Exam Vitals and nursing note reviewed.  Cardiovascular:     Rate and Rhythm: Normal rate and regular rhythm.  Neurological:     General: No focal deficit present.     Mental Status: She is alert and oriented to person, place, and time.  Psychiatric:        Attention and Perception: Attention and perception normal.        Mood and Affect: Mood normal.        Speech: Speech normal.        Behavior: Behavior normal. Behavior is cooperative.        Thought Content: Thought content is paranoid (improved).        Cognition and Memory: Cognition and memory normal.        Judgment: Judgment is impulsive.   Review of Systems  Eyes: Negative.   Cardiovascular: Negative.   Genitourinary: Negative.   Psychiatric/Behavioral:  Negative for depression, substance abuse and suicidal ideas. The patient is nervous/anxious.   All other systems reviewed and are negative. Blood pressure 115/71, pulse 79, temperature 98.5 F (36.9 C), temperature source Oral, resp. rate 16, height 5\' 1"  (1.549 m), weight 48.6 kg, SpO2 100 %. Body mass index is 20.24 kg/m.  Mental Status Per Nursing Assessment::   On Admission:  NA  Demographic Factors:  Low socioeconomic status and Living alone  Loss Factors: Decrease in vocational status and Loss of significant relationship  Historical  Factors: Impulsivity  Risk Reduction  Factors:   Positive social support and Positive coping skills or problem solving skills  Continued Clinical Symptoms:  Bipolar Disorder:   Mixed State  Cognitive Features That Contribute To Risk:  Closed-mindedness    Suicide Risk:  Minimal: No identifiable suicidal ideation.  Patients presenting with no risk factors but with morbid ruminations; may be classified as minimal risk based on the severity of the depressive symptoms   Follow-up Information     [facility] Go on 04/21/2022.   Specialty: Behavioral Health Why: Please go to this provider for therapy and medication management services on 04/21/22  at 7:30 am.  Services are provided on a first come, first served basis. Contact information: [address] [place] [address] [phone]        [facility] Call.   Why: A referral for ACTT services has been made on your behalf. Please call to follow up on services. Contact information: [address] [place] [address] [phone]         [facility] Follow up.   Why: Same-Day Access Hours:  Monday - Friday, 8:30am - 3:00pm Walk-In Crisis Hours: Monday - Friday, 8am - 8pm.   Please go to this location for follow up with med management and therapy. Contact information: [address] [place] [address] [phone]                 Plan Of Care/Follow-up recommendations:  Activity:  as tolerated  Diet:  heart healthy   Take all medications as prescribed. Keep all follow-up appointments as scheduled.  Do not consume alcohol or use illegal drugs while on prescription medications. Report any adverse effects from your medications to your primary care provider promptly.  In the event of recurrent symptoms or worsening symptoms, call 911, a crisis hotline, or go to the nearest emergency department for evaluation.    Oneta Rack, NP 04/16/2022, 10:16 AM

## 2022-04-16 NOTE — Plan of Care (Signed)
Patient was able to identify positive coping strategies at completion of recreational therapy group sessions.   Stephanie [name], LRT,CTRS

## 2022-04-16 NOTE — Progress Notes (Signed)
Recreation Therapy Notes  INPATIENT RECREATION TR PLAN  Patient Details Name: Stephanie [name] MRN: [id number] DOB: 20-Aug-1976 Today's Date: 04/16/2022  Rec Therapy Plan Is patient appropriate for Therapeutic Recreation?: Yes Treatment times per week: about 3 days Estimated Length of Stay: 5-7 days TR Treatment/Interventions: Group participation (Comment)  Discharge Criteria Pt will be discharged from therapy if:: Discharged Treatment plan/goals/alternatives discussed and agreed upon by:: Patient/family  Discharge Summary Short term goals set: See patient care plan Short term goals met: Complete Progress toward goals comments: Groups attended Which groups?: Coping skills, Communication, Other (Comment) (Team Building; Archivist) Reason goals not met: None Therapeutic equipment acquired: N/A Reason patient discharged from therapy: Discharge from hospital Pt/family agrees with progress & goals achieved: Yes Date patient discharged from therapy: 04/16/22   Victorino Sparrow, Vickki Muff, Boyd A 04/16/2022, 1:18 PM
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
## 2022-04-16 NOTE — [facility] Suicide Risk Assessment (Signed)
[facility] INPATIENT:  Family/Significant Other Suicide Prevention Education  Suicide Prevention Education:  Contact Attempts: Sherren Kerns,  [phone]) (name of family member/significant other) has been identified by the patient as the family member/significant other with whom the patient will be residing, and identified as the person(s) who will aid the patient in the event of a mental health crisis.  With written consent from the patient, two attempts were made to provide suicide prevention education, prior to and/or following the patient's discharge.  We were unsuccessful in providing suicide prevention education.  A suicide education pamphlet was given to the patient to share with family/significant other.  Date and time of first attempt:5/25 @ 1pm Date and time of second attempt:5/26 @ 9:30am.   Stephanie [name] 04/16/2022, 9:29 AM

## 2022-04-16 NOTE — Progress Notes (Signed)
   04/16/22 0515  Sleep  Number of Hours 8

## 2022-04-16 NOTE — Progress Notes (Signed)
  [facility] Adult Case Management Discharge Plan :  Will you be returning to the same living situation after discharge:  Yes,  home, [facility] housing At discharge, do you have transportation home?: Yes,  [employer] Do you have the ability to pay for your medications: Yes,  provided resources for discounted medications  Release of information consent forms completed and in the chart;  Patient's signature needed at discharge.  Patient to Follow up at:  Follow-up Information     [facility]. Go on 04/21/2022.   Specialty: Behavioral Health Why: Please go to this provider for therapy and medication management services on 04/21/22  at 7:30 am.  Services are provided on a first come, first served basis. Contact information: [address] [place] [address] [phone]        [facility] Call.   Why: A referral for ACTT services has been made on your behalf. Please call to follow up on services. Contact information: [address] [place] [address] [phone]         [facility] Follow up.   Why: Same-Day Access Hours:  Monday - Friday, 8:30am - 3:00pm Walk-In Crisis Hours: Monday - Friday, 8am - 8pm.   Please go to this location for follow up with med management and therapy. Contact information: [address] [place] [address] [phone]                 Next level of care provider has access to [facility] Link:yes  Safety Planning and Suicide Prevention discussed: Yes,  with patient, unable to get in contact with case manager at [facility]     Has patient been referred to the Quitline?: N/A patient is not a smoker  Patient has been referred for addiction treatment: N/A  Moe Brier E Gurjot Brisco, LCSW 04/16/2022, 9:41 AM

## 2022-04-16 NOTE — [facility] Group Notes (Signed)
[facility] Group Notes:  (Nursing/MHT/Case Management/Adjunct)  Date:  04/16/2022  Time:  10:36 AM  Type of Therapy:   Orientation/Goals group  Participation Level:  Active  Participation Quality:  Appropriate  Affect:  Appropriate  Cognitive:  Appropriate  Insight:  Appropriate  Engagement in Group:  Engaged  Modes of Intervention:  Discussion, Education, Orientation, and Support  Summary of Progress/Problems: Pt goal for today is to go to groups, listen and pay attention.   Chuck Caban J Maegan Buller 04/16/2022, 10:36 AM

## 2022-08-06 DIAGNOSIS — Z046 Encounter for general psychiatric examination, requested by authority: Secondary | ICD-10-CM

## 2022-12-13 ENCOUNTER — Emergency Department (HOSPITAL_BASED_OUTPATIENT_CLINIC_OR_DEPARTMENT_OTHER)
Admission: EM | Admit: 2022-12-13 | Discharge: 2022-12-13 | Disposition: A | Discharge status: Home / Self Care | Payer: Self-pay | Attending: Emergency Medicine | Admitting: Emergency Medicine

## 2022-12-13 ENCOUNTER — Encounter (HOSPITAL_BASED_OUTPATIENT_CLINIC_OR_DEPARTMENT_OTHER): Payer: Self-pay

## 2022-12-13 ENCOUNTER — Other Ambulatory Visit: Payer: Self-pay

## 2022-12-13 DIAGNOSIS — Z765 Malingerer [conscious simulation]: Secondary | ICD-10-CM | POA: Insufficient documentation

## 2022-12-13 DIAGNOSIS — N390 Urinary tract infection, site not specified: Secondary | ICD-10-CM | POA: Insufficient documentation

## 2022-12-13 LAB — PREGNANCY, URINE: Preg Test, Ur: NEGATIVE

## 2022-12-13 LAB — URINALYSIS, ROUTINE W REFLEX MICROSCOPIC
Bilirubin Urine: NEGATIVE
Glucose, UA: NEGATIVE mg/dL
Ketones, ur: NEGATIVE mg/dL
Nitrite: NEGATIVE
Protein, ur: 100 mg/dL — AB
Specific Gravity, Urine: >=1.03 (ref 1.005–1.030)
pH: 6 (ref 5.0–8.0)

## 2022-12-13 LAB — URINALYSIS, MICROSCOPIC (REFLEX): WBC, UA: >50 WBC/hpf (ref 0–5)

## 2022-12-13 MED ORDER — FOSFOMYCIN TROMETHAMINE 3 G PO PACK
3.0000 g | PACK | Freq: Once | ORAL | Status: AC
  Administered 2022-12-13: 3 g via ORAL
  Filled 2022-12-13: qty 3
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
## 2022-12-13 NOTE — ED Notes (Signed)
Pt visualized walking out of facility.
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
## 2022-12-14 LAB — URINE CULTURE

## 2023-01-30 ENCOUNTER — Other Ambulatory Visit: Payer: Self-pay

## 2023-01-30 ENCOUNTER — Emergency Department (HOSPITAL_BASED_OUTPATIENT_CLINIC_OR_DEPARTMENT_OTHER)
Admission: EM | Admit: 2023-01-30 | Discharge: 2023-01-30 | Disposition: A | Discharge status: Home / Self Care | Payer: Self-pay | Attending: Emergency Medicine | Admitting: Emergency Medicine

## 2023-01-30 ENCOUNTER — Encounter (HOSPITAL_BASED_OUTPATIENT_CLINIC_OR_DEPARTMENT_OTHER): Payer: Self-pay

## 2023-01-30 DIAGNOSIS — R519 Headache, unspecified: Secondary | ICD-10-CM | POA: Insufficient documentation

## 2023-01-30 DIAGNOSIS — Y9301 Activity, walking, marching and hiking: Secondary | ICD-10-CM | POA: Insufficient documentation

## 2023-01-30 DIAGNOSIS — W228XXA Striking against or struck by other objects, initial encounter: Secondary | ICD-10-CM | POA: Insufficient documentation

## 2023-01-30 MED ORDER — ACETAMINOPHEN 500 MG PO TABS
1000.0000 mg | ORAL_TABLET | Freq: Once | ORAL | Status: AC
  Administered 2023-01-30: 1000 mg via ORAL
  Filled 2023-01-30: qty 2
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
## 2023-01-30 NOTE — ED Notes (Signed)
This CN called to lobby due to patient behavior. Pt found to be yelling at registration staff. CN reassured pt that we are here to help her in whatever way she needs but that she needs to calm down, and stop yelling at people and saying inappropriate things. Pt reminded there are children in the lobby, and asked to remain calm while we try to help her. Pt continued yelling over CN. Security present.

## 2023-01-31 ENCOUNTER — Encounter (HOSPITAL_BASED_OUTPATIENT_CLINIC_OR_DEPARTMENT_OTHER): Payer: Self-pay | Admitting: Emergency Medicine

## 2023-01-31 ENCOUNTER — Other Ambulatory Visit: Payer: Self-pay

## 2023-01-31 ENCOUNTER — Emergency Department (HOSPITAL_BASED_OUTPATIENT_CLINIC_OR_DEPARTMENT_OTHER)
Admission: EM | Admit: 2023-01-31 | Discharge: 2023-01-31 | Disposition: A | Discharge status: Home / Self Care | Payer: Self-pay | Attending: Emergency Medicine | Admitting: Emergency Medicine

## 2023-01-31 DIAGNOSIS — M79671 Pain in right foot: Secondary | ICD-10-CM | POA: Insufficient documentation

## 2023-01-31 DIAGNOSIS — R519 Headache, unspecified: Secondary | ICD-10-CM | POA: Insufficient documentation

## 2023-01-31 DIAGNOSIS — M79672 Pain in left foot: Secondary | ICD-10-CM | POA: Insufficient documentation

## 2023-01-31 MED ORDER — IBUPROFEN 800 MG PO TABS: 800.0000 mg | ORAL_TABLET | Freq: Once | ORAL | Status: DC

## 2023-01-31 NOTE — ED Notes (Signed)
Pt declined all things offered to her by staff: beverage, blanket, heat packs, medication.
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
## 2023-01-31 NOTE — ED Triage Notes (Signed)
Pt seen and tx in this ED last evening for reported head injury. Pt returns stating her feet hurt because she walked all the way to [facility]. No injury. No other complaint. EDP to triage to eval patient as he saw her already on this same shift.

## 2023-01-31 NOTE — ED Provider Notes (Signed)
[place] [facility] [place] [facility] Provider Note   CSN: [id number] Arrival date & time: 01/31/23  0507     History  Chief Complaint  Patient presents with   Foot Pain    Stephanie [name] is a 47 y.o. female.  89 year old presents ER today with bilateral foot pain.  I saw her a few hours ago when she had some type of vague headache from the last year.  She states that after she left she walked and was halfway to [place] She was going the wrong way so she started to walk back and now her feet hurt.  No new swelling.  No new symptoms.   Foot Pain       Home Medications Prior to Admission medications   Medication Sig Start Date End Date Taking? Authorizing Provider  ARIPiprazole ER (ABILIFY MAINTENA) 400 MG SRER injection Inject 2 mLs (400 mg total) into the muscle every 28 (twenty-eight) days. 05/07/22   Derrill Center, NP  ziprasidone (GEODON) 20 MG capsule Take 1 capsule (20 mg total) by mouth 2 (two) times daily with a meal. 04/16/22   Derrill Center, NP      Allergies    Haldol [haloperidol lactate], Abilify [aripiprazole], Penicillins, and Penicillins    Review of Systems   Review of Systems  Physical Exam Updated Vital Signs There were no vitals taken for this visit. Physical Exam Vitals and nursing note reviewed.  Constitutional:      Appearance: She is well-developed.  HENT:     Head: Normocephalic and atraumatic.  Eyes:     Pupils: Pupils are equal, round, and reactive to light.  Cardiovascular:     Rate and Rhythm: Normal rate and regular rhythm.  Pulmonary:     Effort: No respiratory distress.     Breath sounds: No stridor.  Abdominal:     General: Abdomen is flat. There is no distension.  Musculoskeletal:        General: No tenderness. Normal range of motion.     Cervical back: Normal range of motion.     Comments: Ambulates with a normal gait no antalgia.  Skin:    General: Skin is warm and dry.  Neurological:      General: No focal deficit present.     Mental Status: She is alert.     ED Results / Procedures / Treatments   Labs (all labs ordered are listed, but only abnormal results are displayed) Labs Reviewed - No data to display  EKG None  Radiology No results found.  Procedures Procedures    Medications Ordered in ED Medications  ibuprofen (ADVIL) tablet 800 mg (has no administration in time range)    ED Course/ Medical Decision Making/ A&P                             Medical Decision Making Risk Prescription drug management.   She does have holes in her shoes so we will get extra pair of socks for comfort and give her dose of ibuprofen.  No indication for any imaging.  She walks with a normal gait.  Likely needs a new pair shoes.  We do not have those here.   Final Clinical Impression(s) / ED Diagnoses Final diagnoses:  Foot pain, bilateral    Rx / DC Orders ED Discharge Orders     None         Glenice Ciccone, Corene Cornea, MD 01/31/23  0517  

## 2023-10-05 ENCOUNTER — Other Ambulatory Visit: Payer: Self-pay

## 2023-10-05 ENCOUNTER — Emergency Department (HOSPITAL_COMMUNITY)
Admission: EM | Admit: 2023-10-05 | Discharge: 2023-10-06 | Disposition: A | Discharge status: Home / Self Care | Payer: Self-pay | Attending: Emergency Medicine | Admitting: Emergency Medicine

## 2023-10-05 ENCOUNTER — Encounter (HOSPITAL_COMMUNITY): Payer: Self-pay

## 2023-10-05 DIAGNOSIS — M79671 Pain in right foot: Secondary | ICD-10-CM | POA: Insufficient documentation

## 2023-10-05 DIAGNOSIS — M79672 Pain in left foot: Secondary | ICD-10-CM | POA: Insufficient documentation
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
## 2023-10-06 MED ORDER — IBUPROFEN 400 MG PO TABS
600.0000 mg | ORAL_TABLET | Freq: Once | ORAL | Status: AC
  Administered 2023-10-06: 600 mg via ORAL
  Filled 2023-10-06: qty 1

## 2023-10-06 NOTE — Discharge Instructions (Signed)
Please take acetaminophen and ibuprofen for continued foot pain as needed.  If you develop emergent symptoms return to an emergency department.
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
## 2023-10-07 ENCOUNTER — Other Ambulatory Visit: Payer: Self-pay

## 2023-10-07 ENCOUNTER — Encounter (HOSPITAL_COMMUNITY): Payer: Self-pay | Admitting: Emergency Medicine

## 2023-10-07 ENCOUNTER — Emergency Department (HOSPITAL_COMMUNITY)
Admission: EM | Admit: 2023-10-07 | Discharge: 2023-10-07 | Disposition: A | Discharge status: Home / Self Care | Payer: Self-pay | Attending: Emergency Medicine | Admitting: Emergency Medicine

## 2023-10-07 DIAGNOSIS — Z5900 Homelessness unspecified: Secondary | ICD-10-CM | POA: Insufficient documentation

## 2023-10-07 DIAGNOSIS — R4689 Other symptoms and signs involving appearance and behavior: Secondary | ICD-10-CM | POA: Insufficient documentation

## 2023-10-07 DIAGNOSIS — R443 Hallucinations, unspecified: Secondary | ICD-10-CM | POA: Insufficient documentation

## 2023-10-07 LAB — COMPREHENSIVE METABOLIC PANEL
ALT: 24 U/L (ref 0–44)
AST: 34 U/L (ref 15–41)
Albumin: 4.1 g/dL (ref 3.5–5.0)
Alkaline Phosphatase: 83 U/L (ref 38–126)
Anion gap: 10 (ref 5–15)
BUN: 19 mg/dL (ref 6–20)
CO2: 22 mmol/L (ref 22–32)
Calcium: 9.1 mg/dL (ref 8.9–10.3)
Chloride: 104 mmol/L (ref 98–111)
Creatinine, Ser: 0.68 mg/dL (ref 0.44–1.00)
GFR, Estimated: >60 mL/min (ref >60)
Glucose, Bld: 79 mg/dL (ref 70–99)
Potassium: 3.8 mmol/L (ref 3.5–5.1)
Sodium: 136 mmol/L (ref 135–145)
Total Bilirubin: 0.5 mg/dL (ref <1.2)
Total Protein: 7.9 g/dL (ref 6.5–8.1)

## 2023-10-07 LAB — CBC WITH DIFFERENTIAL/PLATELET
Abs Immature Granulocytes: 0.02 10*3/uL (ref 0.00–0.07)
Basophils Absolute: 0 10*3/uL (ref 0.0–0.1)
Basophils Relative: 0 %
Eosinophils Absolute: 0 10*3/uL (ref 0.0–0.5)
Eosinophils Relative: 1 %
HCT: 37.8 % (ref 36.0–46.0)
Hemoglobin: 11.2 g/dL — ABNORMAL LOW (ref 12.0–15.0)
Immature Granulocytes: 0 %
Lymphocytes Relative: 39 %
Lymphs Abs: 2.5 10*3/uL (ref 0.7–4.0)
MCH: 26.4 pg (ref 26.0–34.0)
MCHC: 29.6 g/dL — ABNORMAL LOW (ref 30.0–36.0)
MCV: 89.2 fL (ref 80.0–100.0)
Monocytes Absolute: 0.5 10*3/uL (ref 0.1–1.0)
Monocytes Relative: 7 %
Neutro Abs: 3.3 10*3/uL (ref 1.7–7.7)
Neutrophils Relative %: 53 %
Platelets: 285 10*3/uL (ref 150–400)
RBC: 4.24 MIL/uL (ref 3.87–5.11)
RDW: 15.1 % (ref 11.5–15.5)
WBC: 6.3 10*3/uL (ref 4.0–10.5)
nRBC: 0 % (ref 0.0–0.2)

## 2023-10-07 LAB — ETHANOL: Alcohol, Ethyl (B): <10 mg/dL (ref <10)

## 2023-10-07 LAB — HCG, SERUM, QUALITATIVE: Preg, Serum: NEGATIVE
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
## 2023-10-07 NOTE — Discharge Instructions (Addendum)
I have attached resources. Please call to schedule an appointment. Return to the ER for new or worsening symptoms.

## 2023-10-07 NOTE — ED Notes (Signed)
This NT provided pt with urine cup for UA. Per pt, "I forgot about it." Pt encouraged to provide urine sample next time. Pt ambulatory to restroom. EMT made aware.

## 2023-10-07 NOTE — ED Provider Notes (Signed)
Care assumed from [facility] pending TTS evaluation. Per Rockney Ghee, NP patient has been psych cleared and can be discharged with outpatient resources. Resources included on discharge paperwork. Patient stable for discharge. Denies SI and HI. Strict ED precautions discussed with patient. Patient states understanding and agrees to plan. Patient discharged home in no acute distress and stable vitals.    Mannie Stabile, PA-C 10/07/23 [id number]    Durwin Glaze, MD 10/07/23 [phone]

## 2023-10-07 NOTE — ED Notes (Signed)
Pt ambulatory to restroom

## 2023-10-07 NOTE — ED Provider Notes (Signed)
[place] EMERGENCY DEPARTMENT AT [facility] Provider Note   CSN: [id number] Arrival date & time: 10/07/23  0205     History  Chief Complaint  Patient presents with   Psychiatric Evaluation    Stephanie [name] is a 47 y.o. female.  Patient presents to the emergency department complaining of auditory and visual hallucinations.  Patient states that she was watching TV earlier this evening when Mickey Mouse crawled out of her TV and stood in front of her, approximately chest high.  She states she looked to her sinus on many female sitting on her couch.  She states she typically watches the [employer] every day and did not watch it today.  She denies SI/HI.  She denies any physical complaints at this time.  She has past medical history significant for auditory hallucinations, schizophrenia, psychosis.  The patient has previously been prescribed Geodon and Abilify but states she does not want to take these medications.  I asked her if she was willing to consider medication at this time and the patient states that she may if it was needed.  HPI     Home Medications Prior to Admission medications   Medication Sig Start Date End Date Taking? Authorizing Provider  ARIPiprazole ER (ABILIFY MAINTENA) 400 MG SRER injection Inject 2 mLs (400 mg total) into the muscle every 28 (twenty-eight) days. Patient not taking: Reported on 10/07/2023 05/07/22   Oneta Rack, NP  naproxen (NAPROSYN) 500 MG tablet Take 500 mg by mouth 2 (two) times daily with a meal. Patient not taking: Reported on 10/07/2023 11/20/22   [provider]  tiZANidine (ZANAFLEX) 4 MG capsule Take 4 mg by mouth every 6 (six) hours. Patient not taking: Reported on 10/07/2023 11/20/22   [provider]  ziprasidone (GEODON) 20 MG capsule Take 1 capsule (20 mg total) by mouth 2 (two) times daily with a meal. Patient not taking: Reported on 10/07/2023 04/16/22   Oneta Rack, NP       Allergies    Haldol [haloperidol lactate], Abilify [aripiprazole], Penicillins, and Penicillins    Review of Systems   Review of Systems  Physical Exam Updated Vital Signs BP 133/80 (BP Location: Left Arm)   Pulse 63   Resp 17   Ht 5\' 2"  (1.575 m)   Wt 70.3 kg   SpO2 100%   BMI 28.35 kg/m  Physical Exam Vitals and nursing note reviewed.  HENT:     Head: Normocephalic and atraumatic.  Eyes:     Conjunctiva/sclera: Conjunctivae normal.  Cardiovascular:     Rate and Rhythm: Normal rate.  Pulmonary:     Effort: Pulmonary effort is normal. No respiratory distress.  Musculoskeletal:        General: No signs of injury.     Cervical back: Normal range of motion.  Skin:    General: Skin is dry.  Neurological:     Mental Status: She is alert.  Psychiatric:        Speech: Speech normal.     ED Results / Procedures / Treatments   Labs (all labs ordered are listed, but only abnormal results are displayed) Labs Reviewed  CBC WITH DIFFERENTIAL/PLATELET - Abnormal; Notable for the following components:      Result Value   Hemoglobin 11.2 (*)    MCHC 29.6 (*)    All other components within normal limits  COMPREHENSIVE METABOLIC PANEL  ETHANOL  HCG, SERUM, QUALITATIVE    EKG None  Radiology  No results found.  Procedures Procedures    Medications Ordered in ED Medications - No data to display  ED Course/ Medical Decision Making/ A&P                                 Medical Decision Making Amount and/or Complexity of Data Reviewed Labs: ordered.   This patient presents to the ED for concern of auditory and visual hallucinations, this involves an extensive number of treatment options, and is a complaint that carries with it a high risk of complications and morbidity.  The differential diagnosis includes schizophrenia, substance induced hallucinations, others   Co morbidities that complicate the patient evaluation  Schizophrenia, auditory  hallucinations   Additional history obtained:  Additional history obtained from EMS External records from outside source obtained and reviewed including outside emergency department notes    Lab Tests:  I Ordered, and personally interpreted labs.  The pertinent results include: Grossly unremarkable labs, UDS pending   Cardiac Monitoring: / EKG:  The patient was maintained on a cardiac monitor.  I personally viewed and interpreted the cardiac monitored which showed an underlying rhythm of: Sinus rhythm with ventricular trigeminy   Consultations Obtained:  I requested consultation with TTS. Counselor recommends outpatient follow up   Social Determinants of Health:  Patient is homeless   Test / Admission - Considered:  Secure chat states that patient will be cleared for outpatient follow up with outpatient resources. Patient care transferred to [facility], PA-C at shift handoff. Plan to discharge patient once behavioral health note is available.          Final Clinical Impression(s) / ED Diagnoses Final diagnoses:  Hallucinations    Rx / DC Orders ED Discharge Orders     None         Pamala Duffel 10/07/23 [id number]    Nira Conn, MD 10/07/23 [phone]

## 2023-10-07 NOTE — ED Triage Notes (Addendum)
Patient c/o auditory and visual hallucinations ongoing for several weeks. Patient also reports an episode of a "shock" throughout her body earlier today. Denies SI/HI.

## 2023-10-12 ENCOUNTER — Other Ambulatory Visit: Payer: Self-pay

## 2023-10-12 ENCOUNTER — Emergency Department (HOSPITAL_COMMUNITY)
Admission: EM | Admit: 2023-10-12 | Discharge: 2023-10-13 | Disposition: A | Discharge status: Home / Self Care | Payer: Self-pay | Attending: Emergency Medicine | Admitting: Emergency Medicine

## 2023-10-12 DIAGNOSIS — R443 Hallucinations, unspecified: Secondary | ICD-10-CM | POA: Insufficient documentation

## 2023-10-12 NOTE — ED Triage Notes (Addendum)
There is people shocking people up there with lasers and I feel it all through my body."I also hear people talking, other than you". When asked what the voices are saying, pt starts rambling. She is redirectable.  Pt says her feet hurt. Denies SI/HI. Denies taking any medications on a daily basis.

## 2023-10-13 ENCOUNTER — Emergency Department (HOSPITAL_COMMUNITY)
Admission: EM | Admit: 2023-10-13 | Discharge: 2023-10-14 | Disposition: A | Discharge status: Home / Self Care | Payer: Self-pay | Attending: Emergency Medicine | Admitting: Emergency Medicine

## 2023-10-13 ENCOUNTER — Other Ambulatory Visit: Payer: Self-pay

## 2023-10-13 ENCOUNTER — Encounter (HOSPITAL_COMMUNITY): Payer: Self-pay | Admitting: Emergency Medicine

## 2023-10-13 DIAGNOSIS — M79672 Pain in left foot: Secondary | ICD-10-CM | POA: Insufficient documentation

## 2023-10-13 DIAGNOSIS — R519 Headache, unspecified: Secondary | ICD-10-CM | POA: Insufficient documentation

## 2023-10-13 DIAGNOSIS — G8929 Other chronic pain: Secondary | ICD-10-CM | POA: Insufficient documentation

## 2023-10-13 LAB — COMPREHENSIVE METABOLIC PANEL
ALT: 24 U/L (ref 0–44)
AST: 31 U/L (ref 15–41)
Albumin: 4.3 g/dL (ref 3.5–5.0)
Alkaline Phosphatase: 88 U/L (ref 38–126)
Anion gap: 6 (ref 5–15)
BUN: 20 mg/dL (ref 6–20)
CO2: 28 mmol/L (ref 22–32)
Calcium: 9.6 mg/dL (ref 8.9–10.3)
Chloride: 103 mmol/L (ref 98–111)
Creatinine, Ser: 0.8 mg/dL (ref 0.44–1.00)
GFR, Estimated: >60 mL/min (ref >60)
Glucose, Bld: 89 mg/dL (ref 70–99)
Potassium: 3.7 mmol/L (ref 3.5–5.1)
Sodium: 137 mmol/L (ref 135–145)
Total Bilirubin: 0.3 mg/dL (ref <1.2)
Total Protein: 8.3 g/dL — ABNORMAL HIGH (ref 6.5–8.1)

## 2023-10-13 LAB — HCG, SERUM, QUALITATIVE: Preg, Serum: NEGATIVE

## 2023-10-13 LAB — CBC
HCT: 37.1 % (ref 36.0–46.0)
Hemoglobin: 11.5 g/dL — ABNORMAL LOW (ref 12.0–15.0)
MCH: 26.3 pg (ref 26.0–34.0)
MCHC: 31 g/dL (ref 30.0–36.0)
MCV: 84.7 fL (ref 80.0–100.0)
Platelets: 351 10*3/uL (ref 150–400)
RBC: 4.38 MIL/uL (ref 3.87–5.11)
RDW: 15.2 % (ref 11.5–15.5)
WBC: 5.9 10*3/uL (ref 4.0–10.5)
nRBC: 0 % (ref 0.0–0.2)

## 2023-10-13 LAB — ETHANOL: Alcohol, Ethyl (B): <10 mg/dL (ref <10)

## 2023-10-13 MED ORDER — IBUPROFEN 200 MG PO TABS
600.0000 mg | ORAL_TABLET | Freq: Once | ORAL | Status: AC
  Administered 2023-10-13: 600 mg via ORAL
  Filled 2023-10-13: qty 3

## 2023-10-13 NOTE — ED Triage Notes (Signed)
Pt reports a headache & reports it feels as if she is being shocked. Reports she was seen for same yesterday. Also reports left foot pain from walking a long distance.

## 2023-10-13 NOTE — Discharge Instructions (Addendum)
Please follow-up with outpatient counseling for further evaluation of your schizophrenia as needed.  You may take over-the-counter medication such as Tylenol or ibuprofen for your foot pain.

## 2023-10-13 NOTE — ED Provider Notes (Signed)
[place] EMERGENCY DEPARTMENT AT [facility] Provider Note   CSN: [id number] Arrival date & time: 10/12/23  2304     History  Chief Complaint  Patient presents with   Hallucinations    Stephanie [name] is a 47 y.o. female.  Patient with history of schizophrenia and auditory hallucinations presents to the emergency department complaining of bilateral foot pain and auditory hallucinations.  She denies SI, HI.  She has declined multiple times to take any sort of psychiatric medications.  Past medical history otherwise significant for psychosis.  HPI     Home Medications Prior to Admission medications   Medication Sig Start Date End Date Taking? Authorizing Provider  ARIPiprazole ER (ABILIFY MAINTENA) 400 MG SRER injection Inject 2 mLs (400 mg total) into the muscle every 28 (twenty-eight) days. Patient not taking: Reported on 10/07/2023 05/07/22   Oneta Rack, NP  naproxen (NAPROSYN) 500 MG tablet Take 500 mg by mouth 2 (two) times daily with a meal. Patient not taking: Reported on 10/07/2023 11/20/22   [provider]  tiZANidine (ZANAFLEX) 4 MG capsule Take 4 mg by mouth every 6 (six) hours. Patient not taking: Reported on 10/07/2023 11/20/22   [provider]  ziprasidone (GEODON) 20 MG capsule Take 1 capsule (20 mg total) by mouth 2 (two) times daily with a meal. Patient not taking: Reported on 10/07/2023 04/16/22   Oneta Rack, NP      Allergies    Haldol [haloperidol lactate], Abilify [aripiprazole], Penicillins, and Penicillins    Review of Systems   Review of Systems  Physical Exam Updated Vital Signs BP (!) 108/95 (BP Location: Right Arm)   Pulse 81   Temp 98 F (36.7 C) (Oral)   Resp 16   SpO2 99%  Physical Exam HENT:     Head: Normocephalic and atraumatic.  Eyes:     Pupils: Pupils are equal, round, and reactive to light.  Pulmonary:     Effort: Pulmonary effort is normal. No respiratory distress.  Musculoskeletal:         General: No swelling, tenderness, deformity or signs of injury. Normal range of motion.     Cervical back: Normal range of motion.     Comments: No tenderness, obvious deformity to either foot.  Palpable pedal pulses bilaterally.  Sensation and motor function intact bilaterally.  Skin:    General: Skin is dry.  Neurological:     Mental Status: She is alert.  Psychiatric:        Speech: Speech normal.        Behavior: Behavior normal.     ED Results / Procedures / Treatments   Labs (all labs ordered are listed, but only abnormal results are displayed) Labs Reviewed  COMPREHENSIVE METABOLIC PANEL - Abnormal; Notable for the following components:      Result Value   Total Protein 8.3 (*)    All other components within normal limits  CBC - Abnormal; Notable for the following components:   Hemoglobin 11.5 (*)    All other components within normal limits  ETHANOL  HCG, SERUM, QUALITATIVE  RAPID URINE DRUG SCREEN, HOSP PERFORMED    EKG None  Radiology No results found.  Procedures Procedures    Medications Ordered in ED Medications  ibuprofen (ADVIL) tablet 600 mg (600 mg Oral Given 10/13/23 0153)    ED Course/ Medical Decision Making/ A&P  Medical Decision Making Amount and/or Complexity of Data Reviewed Labs: ordered.  Risk OTC drugs.   This patient presents to the ED for concern of bilateral foot pain and hallucinations, this involves an extensive number of treatment options, and is a complaint that carries with it a high risk of complications and morbidity.  The differential diagnosis includes overuse injury, fracture, dislocation, soft tissue injury.  Differential for the hallucinations includes flares of schizophrenia, psychosis, drug use, others   Co morbidities that complicate the patient evaluation  Schizophrenia   Additional history obtained:   External records from outside source obtained and reviewed  including behavioral health notes from November 15 showing recommendations for outpatient follow-up with similar presentation   Lab Tests:  I Ordered, and personally interpreted labs.  The pertinent results include: Grossly unremarkable labs   Problem List / ED Course / Critical interventions / Medication management   I ordered medication including ibuprofen for foot pain Reevaluation of the patient after these medicines showed that the patient improved I have reviewed the patients home medicines and have made adjustments as needed   Social Determinants of Health:  Patient with reported housing insecurity   Test / Admission - Considered:  Patient treated for her foot pain with ibuprofen.  No indication for imaging or further emergent workup for the foot pain at this time.  No sign of ulcers, necrotizing fasciitis, cellulitis, fracture, dislocation. I personally saw this patient 2 times last week with similar presentations each time.  Patient endorsed auditory hallucinations and at one visit also endorsed the foot pain.  Patient reports walking and being on her feet for long periods throughout the day.  Behavioral health was consulted at the last visit with recommendations for outpatient follow-up.  Patient has no interest in medical therapy at this time.  Patient does contract for safety.  She does not appear to be a threat to herself or others and this does appear to be baseline for the patient at this time.  Patient discharged home at this time with return precautions and outpatient resource information.         Final Clinical Impression(s) / ED Diagnoses Final diagnoses:  Hallucinations    Rx / DC Orders ED Discharge Orders     None         Pamala Duffel 10/13/23 0514    Dione Booze, MD 10/13/23 [phone]

## 2023-10-14 MED ORDER — ACETAMINOPHEN 500 MG PO TABS
1000.0000 mg | ORAL_TABLET | Freq: Once | ORAL | Status: AC
  Administered 2023-10-14: 1000 mg via ORAL
  Filled 2023-10-14: qty 2

## 2023-10-14 NOTE — ED Provider Notes (Signed)
Emergency Department Provider Note   I have reviewed the triage vital signs and the nursing notes.   HISTORY  Chief Complaint Headache and Foot Pain   HPI Stephanie [name] is a 47 y.o. female past history of schizophrenia and homelessness presents to the emergency department with complaint of left foot pain and "electric shock" pain in the head.  She has baseline auditory hallucinations with her schizophrenia.  She is not treating this and does not plan to.  She adamantly denies any suicidal or homicidal ideation.  She is unhoused and walking/standing most of the day pain in the feet, which is worse on the left.  She has frequent ED visits for similar.   Past Medical History:  Diagnosis Date   Schizophrenia (HCC)     Review of Systems  Constitutional: No fever/chills Cardiovascular: Denies chest pain. Respiratory: Denies shortness of breath. Gastrointestinal: No abdominal pain.  No nausea, no vomiting.   Musculoskeletal: Positive left foot pain.  Skin: Negative for rash. Neurological: Negative for focal weakness or numbness. Positive HA.   ____________________________________________   PHYSICAL EXAM:  VITAL SIGNS: ED Triage Vitals  Encounter Vitals Group     BP 10/13/23 2231 (!) 140/96     Pulse Rate 10/13/23 2231 86     Resp 10/13/23 2231 18     Temp 10/13/23 2231 97.7 F (36.5 C)     Temp Source 10/13/23 2231 Oral     SpO2 10/13/23 2231 94 %   Constitutional: Alert and oriented. Well appearing and in no acute distress. Eyes: Conjunctivae are normal. Head: Atraumatic. Nose: No congestion/rhinnorhea. Mouth/Throat: Mucous membranes are moist. Neck: No stridor.   Cardiovascular: Normal rate, regular rhythm. Good peripheral circulation. Grossly normal heart sounds.   Respiratory: Normal respiratory effort.  No retractions. Lungs CTAB. Gastrointestinal: Soft and nontender. No distention.  Musculoskeletal: No lower extremity tenderness nor edema. No gross  deformities of extremities.  Grossly normal left foot, ankle, tib-fib.  Soft compartments.  2+ DP pulses.  Normal sensation.  No wounds.  Neurologic:  Normal speech and language. No gross focal neurologic deficits are appreciated.  Skin:  Skin is warm, dry and intact. No rash noted.  ____________________________________________   PROCEDURES  Procedure(s) performed:   Procedures  None  ____________________________________________   INITIAL IMPRESSION / ASSESSMENT AND PLAN / ED COURSE  Pertinent labs & imaging results that were available during my care of the patient were reviewed by me and considered in my medical decision making (see chart for details).   This patient is Presenting for Evaluation of HA, which does require a range of treatment options, and is a complaint that involves a high risk of morbidity and mortality.  The Differential Diagnoses includes but is not exclusive to subarachnoid hemorrhage, meningitis, encephalitis, previous head trauma, cavernous venous thrombosis, muscle tension headache, glaucoma, temporal arteritis, migraine or migraine equivalent, etc.  I decided to review pertinent External Data, and in summary patient with frequent ED visits for similar including in the last 24 hours.  Radiologic Tests: Considered foot imaging and neuroimaging but intact neuroexam.  Grossly normal-appearing foot with no bony tenderness.  Intact pulses.  Defer imaging for now.  Social Determinants of Health Risk patient is currently unhoused.  Medical Decision Making: Summary:  Patient presents to the emergency department with left foot pain and headache.  These are chronic complaints for her.  Exam is reassuring and I do not feel that emergent imaging is indicated.  Plan for list of area resources  and discharge at this time.  No indication for emergent psychiatry evaluation, although considered.   Patient's presentation is most consistent with exacerbation of chronic  illness.   Disposition: discharge  ____________________________________________  FINAL CLINICAL IMPRESSION(S) / ED DIAGNOSES  Final diagnoses:  Left foot pain  Chronic nonintractable headache, unspecified headache type     Note:  This document was prepared using Dragon voice recognition software and may include unintentional dictation errors.  Alona Bene, MD, [facility] Emergency Medicine    Gawain Crombie, Arlyss Repress, MD 10/14/23 [phone]

## 2023-10-14 NOTE — Discharge Instructions (Signed)
Substance Abuse Treatment Programs ° °Intensive Outpatient Programs °[facility]     °[address]      °[place], [place]                   °[phone]      ° °[facility] °[address] °[place], [place] °[phone] ° °[facility]     °(Inpatient and outpatient)     °[address]           °[phone]   ° °[facility] °[phone] (Suboxone and Methadone) ° °[address]      °[place], [place] [address]      °336-882-2125      ° °[address] °[place], [place] °852-3033 ° °[facility] (Outpatient/Inpatient, [facility])    °(insurance only) [phone]      °       °[facility] (Groups & Residential) °[place], [place] °[phone] ° °   °[facility]     °[address]     °[place], [place]      °[phone]      ° °[facility] (for caregivers and family) °[address] °[place], [place] °336-299-4655 ° ° ° ° ° °Residential Treatment Programs °[facility]      °[address], [place], [place] [address]  °[phone]      ° °[facility] °[address] [place], [place] [address] °[phone] ° °[facility]        °[phone]      ° °[facility] °[phone] ° °[facility]             °[address]                                         °[place], [place]                                                °877-615-2722 or [phone]                              ° °[facility] [place] °[address] °[place], [address] °[phone] ° °[facility]    °[address]      °[place], [place]     °[phone]      ° °[facility] °[address] °, [place] °336-285-9073 ° °[facility]   °[address]     °[place], [place] [address]     °[phone]      °Admissions: 8am-3pm M-F ° °[facility] °[address] °[place],  [place] °336-227-7417 ° °[facility]: Residential Program (90 Days)   °[place], [place]      °336-725-8389 or [phone]    ° °[facility] °[place], [place] °(Walk in Hours over the weekend or by referral) ° °[facility] °[address], [place], [place] [address] °([phone] ° °Crisis Mobile: [facility]:  1-877-626-1772 (for crisis response 24 hours a day) °[facility] Hotline:      1-800-256-2452 °Outpatient Psychiatry and Counseling ° °[facility]: Mobile Crisis   Management 24 hours:  1-877-626-1772 ° °[facility] sliding scale fee and walk in schedule: M-F 8am-12pm/1pm-3pm °[address]  °[place], [place] [address] °[phone] ° °[facility] °[address] °[place], [place] [address] °336-703-9650 ° °[facility] ([facility])- new patient walk-in appointments available Monday - Friday 8am -3pm.          °[address] °[place], [place] [address] °336-676-6840 or crisis line- 336-676-6905 ° °[facility]/ Intensive Outpatient Therapy Program °[address] °[place], [place] [address] °336-832-9804 ° °[facility]                  °Crisis Services      °[phone]      °[address]     °[place], [place] [address]                ° °[facility]   °[facility] °[phone] °[address] °[place], [place] [address] ° ° °[facility]          °[address]  °[place], [place] [address]       °([phone] ° °[facility] °[address] °[place], [place] [address] °336-292-1510 ° °[facility]    °[address]    °[place], [place] [address]     °[phone]      ° °Parish McKinney, MD     °[address]     °[place] [address]     °[phone]     °  °[facility] °[address] °[place] [address] ° °[facility]     °[address]     °[place], [place]      °336-542-2076      ° °[facility] °Shamsher Ahluwalia, MD °[address] °[place], [place] [address] °[phone] ° °[facility]     °[address]     °[place], [place] [address]     °336-274-1237      ° °[facility] °[address] °[place], [place] [address] °336-854-4450 °Resources for Temporary Residential Assistance/Crisis Centers ° °DAY CENTERS °[facility] °M-F 8am-3pm   °[address] [place], [place] [address]   336-332-0824 °Services include: laundry, barbering, support groups, case management, phone  & computer access, showers, AA/NA mtgs, mental health/substance abuse nurse, job skills class, disability information, VA assistance, spiritual classes, etc.  ° °HOMELESS SHELTERS ° °[facility]     °[facility]   °[address], [place]     °[phone]       °       °[facility] (women and children)       °[address] °[place], [place] [address] °336-275-0820 °[email or web address] for application and process °Application Required ° °[facility]   °[address]    °[place] [address]     °[phone]       °             °[facility] °[address] °[place],  [address] °336.273.5572 °336-235-0363(schedule application appt.) °Application Required ° °[facility] (women only)    °[address]     °[place],  [address]     °336-884-1039      °  Intake starts 6pm daily °Need valid ID, SSC, & Police report °[facility] [place] °[address] °[place], [place] °336-881-5420 °Application Required ° °[facility] (men only)     °[address]      °[place], [place]     °[phone]      ° °[facility] °(Pregnant women only) °[address] °[place], [place] °336-275-0206 ° °[facility]      °[address]      °[place], [place] [address]     °[phone]      °       °[facility] °[address] °[place], [place] °336-723-1848 °90 day commitment/SA/Application process ° °[facility](men only)     °[address]     °[place], [place]     °[phone]       °Check-in at 7pm     °       °[facility] °[address] °[place], [place] [address] °336-248-6684 °Men/Women/Women and Children must be there by 7 pm ° °[facility] °[place], [place] °336-722-8721                ° °

## 2023-10-17 ENCOUNTER — Emergency Department (HOSPITAL_COMMUNITY)
Admission: EM | Admit: 2023-10-17 | Discharge: 2023-10-18 | Disposition: A | Discharge status: Home / Self Care | Payer: Self-pay | Attending: Emergency Medicine | Admitting: Emergency Medicine

## 2023-10-17 ENCOUNTER — Encounter (HOSPITAL_COMMUNITY): Payer: Self-pay | Admitting: Emergency Medicine

## 2023-10-17 ENCOUNTER — Other Ambulatory Visit: Payer: Self-pay

## 2023-10-17 DIAGNOSIS — R443 Hallucinations, unspecified: Secondary | ICD-10-CM | POA: Insufficient documentation

## 2023-10-17 DIAGNOSIS — R519 Headache, unspecified: Secondary | ICD-10-CM | POA: Insufficient documentation

## 2023-10-17 MED ORDER — LORAZEPAM 1 MG PO TABS: 1.0000 mg | ORAL_TABLET | ORAL | Status: DC | PRN

## 2023-10-17 MED ORDER — RISPERIDONE 0.5 MG PO TBDP: 2.0000 mg | ORAL_TABLET | Freq: Three times a day (TID) | ORAL | Status: DC | PRN

## 2023-10-17 MED ORDER — ZIPRASIDONE HCL 20 MG PO CAPS
20.0000 mg | ORAL_CAPSULE | Freq: Two times a day (BID) | ORAL | Status: DC
  Filled 2023-10-17: qty 1

## 2023-10-17 MED ORDER — ZIPRASIDONE MESYLATE 20 MG IM SOLR: 20.0000 mg | INTRAMUSCULAR | Status: DC | PRN
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
## 2023-10-18 LAB — CBC WITH DIFFERENTIAL/PLATELET
Abs Immature Granulocytes: 0.01 10*3/uL (ref 0.00–0.07)
Basophils Absolute: 0 10*3/uL (ref 0.0–0.1)
Basophils Relative: 0 %
Eosinophils Absolute: 0.1 10*3/uL (ref 0.0–0.5)
Eosinophils Relative: 2 %
HCT: 32.7 % — ABNORMAL LOW (ref 36.0–46.0)
Hemoglobin: 10.4 g/dL — ABNORMAL LOW (ref 12.0–15.0)
Immature Granulocytes: 0 %
Lymphocytes Relative: 42 %
Lymphs Abs: 2.1 10*3/uL (ref 0.7–4.0)
MCH: 26.9 pg (ref 26.0–34.0)
MCHC: 31.8 g/dL (ref 30.0–36.0)
MCV: 84.7 fL (ref 80.0–100.0)
Monocytes Absolute: 0.3 10*3/uL (ref 0.1–1.0)
Monocytes Relative: 7 %
Neutro Abs: 2.4 10*3/uL (ref 1.7–7.7)
Neutrophils Relative %: 49 %
Platelets: 289 10*3/uL (ref 150–400)
RBC: 3.86 MIL/uL — ABNORMAL LOW (ref 3.87–5.11)
RDW: 15.9 % — ABNORMAL HIGH (ref 11.5–15.5)
WBC: 4.9 10*3/uL (ref 4.0–10.5)
nRBC: 0 % (ref 0.0–0.2)

## 2023-10-18 LAB — COMPREHENSIVE METABOLIC PANEL
ALT: 23 U/L (ref 0–44)
AST: 29 U/L (ref 15–41)
Albumin: 3.6 g/dL (ref 3.5–5.0)
Alkaline Phosphatase: 71 U/L (ref 38–126)
Anion gap: 6 (ref 5–15)
BUN: 21 mg/dL — ABNORMAL HIGH (ref 6–20)
CO2: 25 mmol/L (ref 22–32)
Calcium: 9 mg/dL (ref 8.9–10.3)
Chloride: 108 mmol/L (ref 98–111)
Creatinine, Ser: 0.79 mg/dL (ref 0.44–1.00)
GFR, Estimated: >60 mL/min (ref >60)
Glucose, Bld: 143 mg/dL — ABNORMAL HIGH (ref 70–99)
Potassium: 3.6 mmol/L (ref 3.5–5.1)
Sodium: 139 mmol/L (ref 135–145)
Total Bilirubin: 0.4 mg/dL (ref <1.2)
Total Protein: 7.2 g/dL (ref 6.5–8.1)

## 2023-10-18 LAB — HCG, SERUM, QUALITATIVE: Preg, Serum: NEGATIVE

## 2023-10-18 LAB — ETHANOL: Alcohol, Ethyl (B): <10 mg/dL (ref <10)
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
## 2023-10-21 ENCOUNTER — Encounter (HOSPITAL_COMMUNITY): Payer: Self-pay

## 2023-10-21 ENCOUNTER — Other Ambulatory Visit: Payer: Self-pay

## 2023-10-21 ENCOUNTER — Emergency Department (HOSPITAL_COMMUNITY)
Admission: EM | Admit: 2023-10-21 | Discharge: 2023-10-24 | Disposition: A | Discharge status: Home / Self Care | Payer: Self-pay

## 2023-10-21 DIAGNOSIS — F209 Schizophrenia, unspecified: Secondary | ICD-10-CM

## 2023-10-21 DIAGNOSIS — F200 Paranoid schizophrenia: Secondary | ICD-10-CM | POA: Diagnosis present

## 2023-10-21 DIAGNOSIS — Z1152 Encounter for screening for COVID-19: Secondary | ICD-10-CM | POA: Insufficient documentation

## 2023-10-21 DIAGNOSIS — F22 Delusional disorders: Secondary | ICD-10-CM | POA: Diagnosis present

## 2023-10-21 LAB — CBC
HCT: 31 % — ABNORMAL LOW (ref 36.0–46.0)
Hemoglobin: 9.7 g/dL — ABNORMAL LOW (ref 12.0–15.0)
MCH: 26.4 pg (ref 26.0–34.0)
MCHC: 31.3 g/dL (ref 30.0–36.0)
MCV: 84.2 fL (ref 80.0–100.0)
Platelets: 279 10*3/uL (ref 150–400)
RBC: 3.68 MIL/uL — ABNORMAL LOW (ref 3.87–5.11)
RDW: 15.5 % (ref 11.5–15.5)
WBC: 6 10*3/uL (ref 4.0–10.5)
nRBC: 0 % (ref 0.0–0.2)

## 2023-10-21 LAB — BASIC METABOLIC PANEL
Anion gap: 10 (ref 5–15)
BUN: 20 mg/dL (ref 6–20)
CO2: 24 mmol/L (ref 22–32)
Calcium: 8.9 mg/dL (ref 8.9–10.3)
Chloride: 106 mmol/L (ref 98–111)
Creatinine, Ser: 0.83 mg/dL (ref 0.44–1.00)
GFR, Estimated: >60 mL/min (ref >60)
Glucose, Bld: 134 mg/dL — ABNORMAL HIGH (ref 70–99)
Potassium: 3.8 mmol/L (ref 3.5–5.1)
Sodium: 140 mmol/L (ref 135–145)

## 2023-10-21 LAB — ETHANOL: Alcohol, Ethyl (B): <10 mg/dL (ref <10)

## 2023-10-21 LAB — SALICYLATE LEVEL: Salicylate Lvl: <7 mg/dL — ABNORMAL LOW (ref 7.0–30.0)

## 2023-10-21 LAB — ACETAMINOPHEN LEVEL: Acetaminophen (Tylenol), Serum: <10 ug/mL — ABNORMAL LOW (ref 10–30)

## 2023-10-21 LAB — HCG, SERUM, QUALITATIVE: Preg, Serum: NEGATIVE
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
## 2023-10-22 DIAGNOSIS — F22 Delusional disorders: Secondary | ICD-10-CM | POA: Diagnosis present

## 2023-10-22 DIAGNOSIS — F200 Paranoid schizophrenia: Secondary | ICD-10-CM | POA: Diagnosis present

## 2023-10-22 LAB — RAPID URINE DRUG SCREEN, HOSP PERFORMED
Amphetamines: NOT DETECTED
Barbiturates: NOT DETECTED
Benzodiazepines: NOT DETECTED
Cocaine: NOT DETECTED
Opiates: NOT DETECTED
Tetrahydrocannabinol: NOT DETECTED

## 2023-10-22 LAB — SARS CORONAVIRUS 2 BY RT PCR: SARS Coronavirus 2 by RT PCR: NEGATIVE

## 2023-10-22 MED ORDER — RISPERIDONE 2 MG PO TABS
2.0000 mg | ORAL_TABLET | Freq: Every day | ORAL | Status: DC
  Filled 2023-10-22 (×2): qty 1

## 2023-10-22 MED ORDER — RISPERIDONE 1 MG PO TABS: 1.0000 mg | ORAL_TABLET | Freq: Every day | ORAL | Status: DC

## 2023-10-22 MED ORDER — DIPHENHYDRAMINE HCL 25 MG PO CAPS
25.0000 mg | ORAL_CAPSULE | Freq: Four times a day (QID) | ORAL | Status: DC | PRN
  Filled 2023-10-22: qty 1

## 2023-10-22 NOTE — ED Notes (Signed)
Pt dressed out in purple scrubs, 2 bags of belongings locked in cabinet on resus side. Pt wanded by security.

## 2023-10-22 NOTE — ED Notes (Signed)
Patient has been and cooperative with care.  Patient has been appropriate with staff. Patient resting comfortably.

## 2023-10-22 NOTE — Progress Notes (Signed)
CSW faxed COVID results to [facility] per their request.    Damita Dunnings, MSW, LCSW-A  6:21 PM 10/22/2023
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
## 2023-10-22 NOTE — ED Notes (Addendum)
Attempted to administer medications. Pt refused to take meds. After multiple attempts, pt became irate, stating I better leave her room now.

## 2023-10-22 NOTE — Progress Notes (Signed)
[facility] LCSW Progress Note   10/22/2023    6:01 PM  Stephanie [name]   [id number]   Type of Contact and Topic:  Psychiatric Bed Placement   Pt accepted to [facility]     Patient meets inpatient criteria per [name], NP   The attending provider will be Dr. Angela Burke   Call report to [phone]  Lum Babe, RN @ [facility] ED notified.     Pt scheduled  to arrive at [facility].    Damita Dunnings, MSW, LCSW-A  6:02 PM 10/22/2023
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
## 2023-10-22 NOTE — ED Notes (Signed)
Patient to room 41. Patient ambulated to room. Patient oriented to unit and room.  Patient had pressured speech and paranoid. Patient was cooperative.  Patient lying in bed.

## 2023-10-22 NOTE — ED Notes (Signed)
Breakfast tray has been given to pt. She is eating at this time. Pt cooperative but continues to talk about how she personally knows [place] and [employer].
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
## 2023-10-22 NOTE — Progress Notes (Signed)
Patient has been denied by [facility] due to no appropriate beds. Patient meets BH inpatient criteria per [name], NP. Patient has been faxed out to the following facilities:   [facility]  [address] [place] [place] [address] [phone]  [facility]  [address] [place] [address] [phone]  [facility] [address]  [address], [place] [address] [phone]  [facility]  [facility], [place] [phone]  [facility]  [address] [place] [address] [phone]  [facility]  [address], [place] [address] [phone]  [facility]  [address] [place] [address] [phone]  [facility]  [address] [place] [address] [phone]  [facility]  [address] [place] [address] [phone]  [facility] EFAX  [address], [place] [phone]  [facility]  [address], [place] [address] [phone]  [facility]  [address] [place] [address] [phone]  [facility] [address]  [place] [address] [phone]  [facility]  [address] [facility] [place] [address] [phone]  [facility]  [address] [place], [place] [address] [phone]  [facility]  [address] [place] [address] [phone]  [facility]  [address], [place] [address] [phone]  [facility]  [address], [place] [address] [phone]   Damita Dunnings, MSW, LCSW-A  2:27 PM 10/22/2023

## 2023-10-22 NOTE — ED Notes (Signed)
No noted suicidal ideation by patient .  No noted homicidal ideation noted by patient .

## 2023-10-23 NOTE — ED Notes (Signed)
Will do vitals when pt wakes up
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
## 2023-10-24 NOTE — ED Notes (Signed)
Report called to Wende Crease, RN at [facility]. I told her I would call her back when the pt was on the way.

## 2023-10-24 NOTE — ED Notes (Signed)
Pt woke up to go to the restroom. Upon returning from the restroom, Pt states that she is going to " fly away and report staff for harassment to [employer], because you work for [employer]." Pt redirected self to room while screaming unintelligible speech. Pt currently resting.

## 2024-03-26 ENCOUNTER — Ambulatory Visit: Payer: MEDICAID | Admitting: Nurse Practitioner
